# Patient Record
Sex: Male | Born: 1935 | Race: White | Hispanic: No | Smoking: Former smoker
Health system: Southern US, Community
[De-identification: ages and names within clinical notes are randomized; demographics above are authoritative.]

## PROBLEM LIST (undated history)

## (undated) DIAGNOSIS — K5790 Diverticulosis of intestine, part unspecified, without perforation or abscess without bleeding: Secondary | ICD-10-CM

## (undated) DIAGNOSIS — I10 Essential (primary) hypertension: Secondary | ICD-10-CM

## (undated) DIAGNOSIS — K219 Gastro-esophageal reflux disease without esophagitis: Secondary | ICD-10-CM

## (undated) DIAGNOSIS — D122 Benign neoplasm of ascending colon: Secondary | ICD-10-CM

## (undated) DIAGNOSIS — E538 Deficiency of other specified B group vitamins: Secondary | ICD-10-CM

## (undated) DIAGNOSIS — K760 Fatty (change of) liver, not elsewhere classified: Secondary | ICD-10-CM

## (undated) DIAGNOSIS — F0390 Unspecified dementia without behavioral disturbance: Secondary | ICD-10-CM

## (undated) DIAGNOSIS — D124 Benign neoplasm of descending colon: Secondary | ICD-10-CM

## (undated) DIAGNOSIS — S06309A Unspecified focal traumatic brain injury with loss of consciousness of unspecified duration, initial encounter: Secondary | ICD-10-CM

## (undated) DIAGNOSIS — D649 Anemia, unspecified: Secondary | ICD-10-CM

## (undated) DIAGNOSIS — C20 Malignant neoplasm of rectum: Secondary | ICD-10-CM

## (undated) DIAGNOSIS — J309 Allergic rhinitis, unspecified: Secondary | ICD-10-CM

## (undated) DIAGNOSIS — S0630AA Unspecified focal traumatic brain injury with loss of consciousness status unknown, initial encounter: Secondary | ICD-10-CM

## (undated) DIAGNOSIS — Z933 Colostomy status: Secondary | ICD-10-CM

## (undated) DIAGNOSIS — H409 Unspecified glaucoma: Secondary | ICD-10-CM

## (undated) DIAGNOSIS — K635 Polyp of colon: Secondary | ICD-10-CM

## (undated) DIAGNOSIS — F1910 Other psychoactive substance abuse, uncomplicated: Secondary | ICD-10-CM

## (undated) DIAGNOSIS — E785 Hyperlipidemia, unspecified: Secondary | ICD-10-CM

## (undated) HISTORY — DX: Essential (primary) hypertension: I10

## (undated) HISTORY — DX: Polyp of colon: K63.5

## (undated) HISTORY — DX: Anemia, unspecified: D64.9

## (undated) HISTORY — DX: Other psychoactive substance abuse, uncomplicated: F19.10

## (undated) HISTORY — DX: Unspecified glaucoma: H40.9

## (undated) HISTORY — PX: CATARACT EXTRACTION W/ INTRAOCULAR LENS IMPLANT: SHX1309

## (undated) HISTORY — DX: Hyperlipidemia, unspecified: E78.5

## (undated) HISTORY — DX: Unspecified dementia, unspecified severity, without behavioral disturbance, psychotic disturbance, mood disturbance, and anxiety: F03.90

## (undated) HISTORY — DX: Allergic rhinitis, unspecified: J30.9

## (undated) SURGERY — PACEMAKER IMPLANT
Anesthesia: Moderate Sedation

---

## 2006-04-18 ENCOUNTER — Emergency Department: Payer: Self-pay | Admitting: Emergency Medicine

## 2006-10-17 ENCOUNTER — Ambulatory Visit: Payer: Self-pay | Admitting: Gastroenterology

## 2010-01-08 ENCOUNTER — Ambulatory Visit: Payer: Self-pay | Admitting: Internal Medicine

## 2012-01-25 ENCOUNTER — Ambulatory Visit: Payer: Self-pay | Admitting: Gastroenterology

## 2012-10-07 ENCOUNTER — Other Ambulatory Visit: Payer: Self-pay

## 2012-10-07 MED ORDER — DONEPEZIL HCL 10 MG PO TABS: 10.0000 mg | ORAL_TABLET | Freq: Every day | ORAL | Status: DC

## 2012-10-07 NOTE — Telephone Encounter (Signed)
Former [facility] patient assigned to Dr Athar.  

## 2012-11-13 ENCOUNTER — Telehealth: Payer: Self-pay | Admitting: Neurology

## 2012-12-11 ENCOUNTER — Encounter: Payer: Self-pay | Admitting: Neurology

## 2012-12-19 ENCOUNTER — Encounter: Payer: Self-pay | Admitting: Neurology

## 2013-02-08 ENCOUNTER — Encounter: Payer: Medicare Other | Admitting: Neurology

## 2013-07-17 NOTE — Telephone Encounter (Signed)
Closing encounter

## 2014-10-07 ENCOUNTER — Ambulatory Visit: Payer: Medicare Other | Admitting: Speech Pathology

## 2014-10-08 ENCOUNTER — Encounter: Payer: Self-pay | Admitting: Speech Pathology

## 2014-10-08 ENCOUNTER — Ambulatory Visit: Payer: Medicare Other | Attending: Family Medicine | Admitting: Speech Pathology

## 2014-10-08 DIAGNOSIS — R1319 Other dysphagia: Secondary | ICD-10-CM

## 2014-10-08 DIAGNOSIS — R1310 Dysphagia, unspecified: Secondary | ICD-10-CM | POA: Insufficient documentation

## 2014-10-08 NOTE — Therapy (Signed)
[place] MAIN [facility] SERVICES [address] [place], [place], [address] Phone: [phone]   Fax:  [phone]  Speech Language Pathology Evaluation Clinical Bedside Swallowing Evaluation  Patient Details  Name: Kenneth Luna. MRN: [id number] Date of Birth: [birth year] Referring Provider:  Dion Body, MD  Encounter Date: 10/08/2014      End of Session - 10/08/14 1344    Visit Number 1   Number of Visits 1   Date for SLP Re-Evaluation 10/08/14   SLP Start Time 1100   SLP Stop Time  1145   SLP Time Calculation (min) 45 min   Activity Tolerance Patient tolerated treatment well      Past Medical History  Diagnosis Date  . Hypertension   . Glaucoma   . Colon polyp   . Diverticulosis   . Allergic rhinitis   . Dementia   . Substance abuse   . Hyperlipidemia   . Anemia     History reviewed. No pertinent past surgical history.  There were no vitals filed for this visit.  Visit Diagnosis: Esophageal dysphagia      Subjective Assessment - 10/08/14 1341    Subjective The patient has dementia and his caregiver gives his swallowing history.  She reports that the patient coughs with biscuits and meats primarily, that his oral intake is decreasing, and that his cough is severe enough at times to cause vomiting.  When questioned, she feels that the coughing begins after he has swallowed a few bites vs. on his first couple swallows or as he swallow.  Per caregiver report, the patient is eating about a quarter of his previous intake at meals.  The patient reports that he is eating less due to feeling full after just a small amount of food.     Patient is accompained by: --  Caregiver   Currently in Pain? No/denies            SLP Evaluation [facility] - 10/08/14 0001    SLP Visit Information   SLP Received On 10/08/14           SLP Education - 10/08/14 1341    Education provided Yes   Education Details Discussed the  results and implications of this evaluation.   Person(s) Educated Patient;Caregiver(s)   Methods Explanation   Comprehension Verbalized understanding  Patient comprehension limited by cognitive impairment     Clinical Swallowing Evaluation:  Oral Motor: Within normal limits  Thin liquid: 6 cup rim sips, 2 pulls through a straw, total amount take approximately 4 ounces.  No overt clinical indicators of aspiration, full laryngeal elevation per palpation, clear vocal quality between boluses.  Pureed solid: 6 self-fed teaspoon boluses apple sauce.  No overt clinical indicators of aspiration, full laryngeal elevation per palpation, clear vocal quality between boluses.  Mechanical soft solid:  graham cracker with peanut butter.  No overt clinical indicators of aspiration, rotary mastication, no oral residue post swallow, full laryngeal elevation per palpation, clear vocal quality between boluses.         Plan - 10/08/14 1345    Clinical Impression Statement This 81 year old man with dementia is presenting with normal oropharyngeal swallowing function per clinical swallowing assessment.  The patient's caregiver gives his swallowing history.  She reports that the patient coughs with biscuits and meats primarily, that his oral intake is decreasing, and that his cough is severe enough at times to cause vomiting.  When questioned, she feels that the coughing begins  after he has swallowed a few bites vs. on his first couple swallows or as he swallow.  Per caregiver report, the patient is eating about a quarter of his previous intake at meals.  The patient reports that he is eating less due to feeling full after just a small amount of food.  The patient demonstrated no overt clinical indications of aspiration with thin liquid, pureed solid, or mechanical soft solid.  The patient demonstrated coughing after he had stated he had enough to eat, he was full.  The cough did not appear to be associated with  swallowing food or liquid and aspiration is not a likely the cause of the cough.  The patient may benefit from consult with GI to fully evaluate esophageal function.  The patient and his caregiver were advised to eat soft foods, to plan to eat smaller but more frequent meals, to consume calorie/nutrient dense foods, and avoid foods that seem to cause coughing.   Speech Therapy Frequency 1x /week   Duration 1 week   Treatment/Interventions Other (comment)  Assessment and explanation   Consulted and Agree with Plan of Care Patient;Family member/caregiver   Family Member Consulted Caregiver          G-Codes - 2014-10-14 1356    Functional Assessment Tool Used Clinical Bedside Swallow Eval   Functional Limitations Swallowing   Swallow Current Status [phone]) At least 1 percent but less than 20 percent impaired, limited or restricted   Swallow Goal Status (P6195) At least 1 percent but less than 20 percent impaired, limited or restricted   Swallow Discharge Status [phone]) At least 1 percent but less than 20 percent impaired, limited or restricted      Problem List There are no active problems to display for this patient.  Leroy Sea, MS/CCC- SLP  Lou Miner October 14, 2014, 2:04 PM  [place] MAIN [facility] SERVICES [address] [place], [place], [address] Phone: [phone]   Fax:  [phone]

## 2015-05-04 ENCOUNTER — Other Ambulatory Visit: Payer: Self-pay | Admitting: Family Medicine

## 2015-05-04 DIAGNOSIS — R740 Nonspecific elevation of levels of transaminase and lactic acid dehydrogenase [LDH]: Principal | ICD-10-CM

## 2015-05-04 DIAGNOSIS — R7401 Elevation of levels of liver transaminase levels: Secondary | ICD-10-CM

## 2015-05-06 ENCOUNTER — Ambulatory Visit
Admission: RE | Admit: 2015-05-06 | Discharge: 2015-05-06 | Disposition: A | Discharge status: Home / Self Care | Payer: Medicare Other | Source: Ambulatory Visit | Attending: Family Medicine | Admitting: Family Medicine

## 2015-05-06 DIAGNOSIS — R7401 Elevation of levels of liver transaminase levels: Secondary | ICD-10-CM

## 2015-05-06 DIAGNOSIS — K8020 Calculus of gallbladder without cholecystitis without obstruction: Secondary | ICD-10-CM | POA: Insufficient documentation

## 2015-05-06 DIAGNOSIS — R740 Nonspecific elevation of levels of transaminase and lactic acid dehydrogenase [LDH]: Secondary | ICD-10-CM | POA: Insufficient documentation

## 2015-05-06 DIAGNOSIS — K769 Liver disease, unspecified: Secondary | ICD-10-CM | POA: Insufficient documentation

## 2015-06-16 ENCOUNTER — Ambulatory Visit: Payer: Medicare Other | Admitting: Dietician

## 2015-06-17 ENCOUNTER — Ambulatory Visit: Payer: Medicare Other | Admitting: Dietician

## 2015-06-23 ENCOUNTER — Inpatient Hospital Stay
Admission: EM | Admit: 2015-06-23 | Discharge: 2015-06-24 | DRG: 378 | Disposition: A | Discharge status: Home / Self Care | Payer: Medicare Other | Attending: Internal Medicine | Admitting: Internal Medicine

## 2015-06-23 ENCOUNTER — Encounter: Payer: Self-pay | Admitting: Medical Oncology

## 2015-06-23 DIAGNOSIS — K921 Melena: Principal | ICD-10-CM | POA: Diagnosis present

## 2015-06-23 DIAGNOSIS — I959 Hypotension, unspecified: Secondary | ICD-10-CM

## 2015-06-23 DIAGNOSIS — K922 Gastrointestinal hemorrhage, unspecified: Secondary | ICD-10-CM | POA: Diagnosis not present

## 2015-06-23 DIAGNOSIS — Z79899 Other long term (current) drug therapy: Secondary | ICD-10-CM

## 2015-06-23 DIAGNOSIS — F0390 Unspecified dementia without behavioral disturbance: Secondary | ICD-10-CM | POA: Diagnosis present

## 2015-06-23 DIAGNOSIS — E871 Hypo-osmolality and hyponatremia: Secondary | ICD-10-CM | POA: Diagnosis present

## 2015-06-23 DIAGNOSIS — E785 Hyperlipidemia, unspecified: Secondary | ICD-10-CM | POA: Diagnosis present

## 2015-06-23 DIAGNOSIS — Z86010 Personal history of colonic polyps: Secondary | ICD-10-CM

## 2015-06-23 DIAGNOSIS — I10 Essential (primary) hypertension: Secondary | ICD-10-CM | POA: Diagnosis present

## 2015-06-23 DIAGNOSIS — Z87891 Personal history of nicotine dependence: Secondary | ICD-10-CM | POA: Diagnosis not present

## 2015-06-23 DIAGNOSIS — R001 Bradycardia, unspecified: Secondary | ICD-10-CM | POA: Diagnosis present

## 2015-06-23 DIAGNOSIS — H409 Unspecified glaucoma: Secondary | ICD-10-CM | POA: Diagnosis present

## 2015-06-23 DIAGNOSIS — R7401 Elevation of levels of liver transaminase levels: Secondary | ICD-10-CM

## 2015-06-23 DIAGNOSIS — R740 Nonspecific elevation of levels of transaminase and lactic acid dehydrogenase [LDH]: Secondary | ICD-10-CM | POA: Diagnosis present

## 2015-06-23 DIAGNOSIS — D62 Acute posthemorrhagic anemia: Secondary | ICD-10-CM

## 2015-06-23 LAB — CBC
HCT: 37.1 % — ABNORMAL LOW (ref 40.0–52.0)
Hemoglobin: 12.6 g/dL — ABNORMAL LOW (ref 13.0–18.0)
MCH: 32.9 pg (ref 26.0–34.0)
MCHC: 34 g/dL (ref 32.0–36.0)
MCV: 96.7 fL (ref 80.0–100.0)
PLATELETS: 171 10*3/uL (ref 150–440)
RBC: 3.84 MIL/uL — AB (ref 4.40–5.90)
RDW: 13 % (ref 11.5–14.5)
WBC: 5.9 10*3/uL (ref 3.8–10.6)

## 2015-06-23 LAB — COMPREHENSIVE METABOLIC PANEL
ALT: 45 U/L (ref 17–63)
AST: 58 U/L — AB (ref 15–41)
Albumin: 3.8 g/dL (ref 3.5–5.0)
Alkaline Phosphatase: 42 U/L (ref 38–126)
Anion gap: 10 (ref 5–15)
BILIRUBIN TOTAL: 0.7 mg/dL (ref 0.3–1.2)
BUN: 16 mg/dL (ref 6–20)
CHLORIDE: 100 mmol/L — AB (ref 101–111)
CO2: 24 mmol/L (ref 22–32)
CREATININE: 0.79 mg/dL (ref 0.61–1.24)
Calcium: 9.5 mg/dL (ref 8.9–10.3)
Glucose, Bld: 118 mg/dL — ABNORMAL HIGH (ref 65–99)
Potassium: 4.2 mmol/L (ref 3.5–5.1)
Sodium: 134 mmol/L — ABNORMAL LOW (ref 135–145)
TOTAL PROTEIN: 6.8 g/dL (ref 6.5–8.1)

## 2015-06-23 LAB — TYPE AND SCREEN
ABO/RH(D): O POS
ANTIBODY SCREEN: NEGATIVE

## 2015-06-23 LAB — HEMOGLOBIN
Hemoglobin: 11.3 g/dL — ABNORMAL LOW (ref 13.0–18.0)
Hemoglobin: 12.8 g/dL — ABNORMAL LOW (ref 13.0–18.0)

## 2015-06-23 LAB — ABO/RH: ABO/RH(D): O POS

## 2015-06-23 MED ORDER — ONDANSETRON HCL 4 MG/2ML IJ SOLN: 4.0000 mg | Freq: Four times a day (QID) | INTRAMUSCULAR | Status: DC | PRN

## 2015-06-23 MED ORDER — DONEPEZIL HCL 5 MG PO TABS
10.0000 mg | ORAL_TABLET | Freq: Every day | ORAL | Status: DC
  Filled 2015-06-23: qty 2

## 2015-06-23 MED ORDER — TIMOLOL MALEATE 0.5 % OP SOLN
1.0000 [drp] | Freq: Two times a day (BID) | OPHTHALMIC | Status: DC
  Administered 2015-06-24: 1 [drp] via OPHTHALMIC
  Filled 2015-06-23: qty 5

## 2015-06-23 MED ORDER — SODIUM CHLORIDE 0.9 % IV SOLN
INTRAVENOUS | Status: DC
  Administered 2015-06-23 – 2015-06-24 (×2): via INTRAVENOUS

## 2015-06-23 MED ORDER — SODIUM CHLORIDE 0.9% FLUSH
3.0000 mL | Freq: Two times a day (BID) | INTRAVENOUS | Status: DC
  Administered 2015-06-23: 3 mL via INTRAVENOUS

## 2015-06-23 MED ORDER — FLUTICASONE PROPIONATE 50 MCG/ACT NA SUSP
2.0000 | Freq: Every day | NASAL | Status: DC | PRN
  Filled 2015-06-23: qty 16

## 2015-06-23 MED ORDER — MONTELUKAST SODIUM 10 MG PO TABS
10.0000 mg | ORAL_TABLET | Freq: Every day | ORAL | Status: DC
  Administered 2015-06-23: 10 mg via ORAL
  Filled 2015-06-23: qty 1

## 2015-06-23 MED ORDER — PANTOPRAZOLE SODIUM 40 MG IV SOLR
40.0000 mg | Freq: Two times a day (BID) | INTRAVENOUS | Status: DC
  Administered 2015-06-23 – 2015-06-24 (×2): 40 mg via INTRAVENOUS
  Filled 2015-06-23 (×2): qty 40

## 2015-06-23 MED ORDER — BRIMONIDINE TARTRATE-TIMOLOL 0.2-0.5 % OP SOLN: 1.0000 [drp] | Freq: Two times a day (BID) | OPHTHALMIC | Status: DC

## 2015-06-23 MED ORDER — ACETAMINOPHEN 650 MG RE SUPP: 650.0000 mg | Freq: Four times a day (QID) | RECTAL | Status: DC | PRN

## 2015-06-23 MED ORDER — LORATADINE 10 MG PO TABS
10.0000 mg | ORAL_TABLET | Freq: Every day | ORAL | Status: DC
  Administered 2015-06-24: 10 mg via ORAL
  Filled 2015-06-23 (×2): qty 1

## 2015-06-23 MED ORDER — BRIMONIDINE TARTRATE 0.2 % OP SOLN
1.0000 [drp] | Freq: Two times a day (BID) | OPHTHALMIC | Status: DC
  Administered 2015-06-24: 1 [drp] via OPHTHALMIC
  Filled 2015-06-23: qty 5

## 2015-06-23 MED ORDER — ONDANSETRON HCL 4 MG PO TABS: 4.0000 mg | ORAL_TABLET | Freq: Four times a day (QID) | ORAL | Status: DC | PRN

## 2015-06-23 MED ORDER — ACETAMINOPHEN 325 MG PO TABS: 650.0000 mg | ORAL_TABLET | Freq: Four times a day (QID) | ORAL | Status: DC | PRN

## 2015-06-23 NOTE — ED Notes (Signed)
Pt denies n/v/d, LOC, SOB or pain.  Pt sts he had one episode of rectal bleeding last night, dark red w/ no clots.  Pt A/O x 4.  NAD

## 2015-06-23 NOTE — H&P (Addendum)
[facility] [place] NAME: Kenneth Luna    MR#:  [id number]  DATE OF BIRTH:  10/08/1932  DATE OF ADMISSION:  06/23/2015  PRIMARY CARE PHYSICIAN: Dion Body, MD   REQUESTING/REFERRING PHYSICIAN:   CHIEF COMPLAINT:   Chief Complaint  Patient presents with  . Rectal Bleeding    HISTORY OF PRESENT ILLNESS: Kenneth Luna  is a 82 y.o. male with a known history of essential hypertension, glaucoma, diverticulosis, dementia, hyperlipidemia, who presents to the hospital with complaints of GI bleed. Apparently patient started having rectal bleeding yesterday at around 8:30 PM it was dark blood pouring out from his rectum, and there was no stool. Patient had less than one half of cup of blood per his estimation. He had no bleeding overnight and he had 2 normal bowel movements earlier today with no blood, stool looks dark brown but no blood was mixed in. Patient intermittently takes aspirin, Aleve as well as Advil as needed, but not daily. Patient denied any abdominal pain, nausea, vomiting, diarrhea. On arrival to emergency room, patient was hypotensive with systolic blood pressure in 90s, labs revealed anemia. Hospitalist services were contacted for admission  PAST MEDICAL HISTORY:   Past Medical History  Diagnosis Date  . Hypertension   . Glaucoma   . Colon polyp   . Diverticulosis   . Allergic rhinitis   . Dementia   . Substance abuse   . Hyperlipidemia   . Anemia     PAST SURGICAL HISTORY: History reviewed. No pertinent past surgical history.  SOCIAL HISTORY:  Social History  Substance Use Topics  . Smoking status: Former Smoker    Types: Pipe    Quit date: 04/18/1978  . Smokeless tobacco: Not on file  . Alcohol Use: Not on file    FAMILY HISTORY: No early coronary artery disease .  DRUG ALLERGIES: No Known Allergies  Review of Systems  Constitutional: Negative for fever, chills, weight loss and  malaise/fatigue.  HENT: Negative for congestion.   Eyes: Negative for blurred vision and double vision.  Respiratory: Negative for cough, sputum production, shortness of breath and wheezing.   Cardiovascular: Negative for chest pain, palpitations, orthopnea, leg swelling and PND.  Gastrointestinal: Positive for blood in stool. Negative for nausea, vomiting, abdominal pain, diarrhea, constipation and melena.  Genitourinary: Negative for dysuria, urgency, frequency and hematuria.  Musculoskeletal: Negative for falls.  Skin: Negative for rash.  Neurological: Negative for dizziness and weakness.  Psychiatric/Behavioral: Negative for depression and memory loss. The patient is not nervous/anxious.     MEDICATIONS AT HOME:  Prior to Admission medications   Medication Sig Start Date End Date Taking? Authorizing Provider  brimonidine-timolol (COMBIGAN) 0.2-0.5 % ophthalmic solution Place 1 drop into both eyes 2 (two) times daily.   Yes Historical Provider, MD  donepezil (ARICEPT) 10 MG tablet Take 10 mg by mouth at bedtime.   Yes Historical Provider, MD  fexofenadine (ALLEGRA) 180 MG tablet Take 180 mg by mouth daily as needed for allergies.    Yes Historical Provider, MD  fluticasone (FLONASE) 50 MCG/ACT nasal spray Place 2 sprays into both nostrils daily as needed for rhinitis.    Yes Historical Provider, MD  metoprolol succinate (TOPROL-XL) 100 MG 24 hr tablet Take 100 mg by mouth daily. Take with or immediately following a meal.   Yes Historical Provider, MD  montelukast (SINGULAIR) 10 MG tablet Take 10 mg by mouth at bedtime.   Yes Historical Provider, MD  valsartan (DIOVAN) 80 MG tablet Take 80 mg by mouth daily.   Yes Historical Provider, MD      PHYSICAL EXAMINATION:   VITAL SIGNS: Blood pressure 161/87, pulse 73, temperature 97.4 F (36.3 C), temperature source Oral, resp. rate 20, height 5\' 5"  (1.651 m), weight 54.885 kg (121 lb), SpO2 99 %.  GENERAL:  82 y.o.-year-old patient lying  in the bed with no acute distress.  EYES: Pupils equal, round, reactive to light and accommodation. No scleral icterus. Extraocular muscles intact.  HEENT: Head atraumatic, normocephalic. Oropharynx and nasopharynx clear.  NECK:  Supple, no jugular venous distention. No thyroid enlargement, no tenderness.  LUNGS: Normal breath sounds bilaterally, no wheezing, rales,rhonchi or crepitation. No use of accessory muscles of respiration.  CARDIOVASCULAR: S1, S2 normal. No murmurs, rubs, or gallops.  ABDOMEN: Soft, nontender, nondistended. Bowel sounds present. No organomegaly or mass.  EXTREMITIES: No pedal edema, cyanosis, or clubbing.  NEUROLOGIC: Cranial nerves II through XII are intact. Muscle strength 5/5 in all extremities. Sensation intact. Gait not checked.  PSYCHIATRIC: The patient is alert and oriented x 3.  SKIN: No obvious rash, lesion, or ulcer.   LABORATORY PANEL:   CBC  Recent Labs Lab 06/23/15 1506  WBC 5.9  HGB 12.6*  HCT 37.1*  PLT 171  MCV 96.7  MCH 32.9  MCHC 34.0  RDW 13.0   ------------------------------------------------------------------------------------------------------------------  Chemistries   Recent Labs Lab 06/23/15 1506  NA 134*  K 4.2  CL 100*  CO2 24  GLUCOSE 118*  BUN 16  CREATININE 0.79  CALCIUM 9.5  AST 58*  ALT 45  ALKPHOS 42  BILITOT 0.7   ------------------------------------------------------------------------------------------------------------------  Cardiac Enzymes No results for input(s): TROPONINI in the last 168 hours. ------------------------------------------------------------------------------------------------------------------  RADIOLOGY: No results found.  EKG: No orders found for this or any previous visit.  IMPRESSION AND PLAN:  Active Problems:   Acute posthemorrhagic anemia   GI bleed   Hypotension   Hyponatremia   Elevated transaminase level  #1. Acute posthemorrhagic anemia, patient's hemoglobin  level was 14.5 in January 2017, decreased to 12.6 today, continue hemoglobin level every 8 hours, transfuse as needed. #2 gastrointestinal bleed of unclear etiology, suspected lower GI bleed, get bleeding scan if recurrent, consult gastroenterologist for recommendations. The patient hasn't had colonoscopy per his recollection. Discussed with gastroenterologist, no octreotide is recommended at this time, initiate patient on Protonix #3. Hypotension, continue IV fluids, transfuse as needed. Holding blood pressure medications. Resume when blood pressure improves #4. Hyponatremia, follow with IV fluid administration. #5. Elevated transaminase level, chronic   All the records are reviewed and case discussed with ED provider. Management plans discussed with the patient, family and they are in agreement.  CODE STATUS: Code Status History    This patient does not have a recorded code status. Please follow your organizational policy for patients in this situation.       TOTAL TIME TAKING CARE OF THIS PATIENT: 50 minutes.    Theodoro Grist M.D on 06/23/2015 at 5:30 PM  Between 7am to 6pm - Pager - [phone] After 6pm go to [email or web address] - password [email or web address] [facility]  Office  [phone]  CC: Primary care physician; Dion Body, MD

## 2015-06-23 NOTE — ED Provider Notes (Signed)
[facility] Emergency Department Provider Note   ____________________________________________  Time seen: ~1615  I have reviewed the triage vital signs and the nursing notes.   HISTORY  Chief Complaint Rectal Bleeding   History limited by: Not Limited   HPI Kenneth Luna. is a 82 y.o. male who presents to the emergency department today because of concern for GI bleed. It occurred last night. Patient states that he did not have any associated pain with the bleeding. It started suddenly. States it was dark red in color. He has not noticed any more bleeding today. Has had two bowel movements today without any bleeding. No history of GI bleeding. Does not recall receiving a colonoscopy in the past.     Past Medical History  Diagnosis Date  . Hypertension   . Glaucoma   . Colon polyp   . Diverticulosis   . Allergic rhinitis   . Dementia   . Substance abuse   . Hyperlipidemia   . Anemia     There are no active problems to display for this patient.   History reviewed. No pertinent past surgical history.  Current Outpatient Rx  Name  Route  Sig  Dispense  Refill  . donepezil (ARICEPT) 10 MG tablet   Oral   Take 1 tablet (10 mg total) by mouth daily.   90 tablet   1   . fexofenadine (ALLEGRA) 180 MG tablet   Oral   Take 180 mg by mouth daily.         . fluticasone (FLONASE) 50 MCG/ACT nasal spray   Each Nare   Place into both nostrils daily.         . metoprolol succinate (TOPROL-XL) 100 MG 24 hr tablet   Oral   Take 100 mg by mouth daily. Take with or immediately following a meal.         . valsartan (DIOVAN) 160 MG tablet   Oral   Take 160 mg by mouth daily.           Allergies Review of patient's allergies indicates no known allergies.  No family history on file.  Social History Social History  Substance Use Topics  . Smoking status: Former Smoker    Types: Pipe    Quit date: 04/18/1978  . Smokeless  tobacco: None  . Alcohol Use: None    Review of Systems  Constitutional: Negative for fever. Cardiovascular: Negative for chest pain. Respiratory: Negative for shortness of breath. Gastrointestinal: Negative for abdominal pain, vomiting and diarrhea. Positive for GI bleed.  Neurological: Negative for headaches, focal weakness or numbness.   10-point ROS otherwise negative.  ____________________________________________   PHYSICAL EXAM:  VITAL SIGNS: ED Triage Vitals  Enc Vitals Group     BP 06/23/15 1503 97/60 mmHg     Pulse Rate 06/23/15 1503 72     Resp 06/23/15 1503 19     Temp 06/23/15 1503 97.4 F (36.3 C)     Temp Source 06/23/15 1503 Oral     SpO2 06/23/15 1503 98 %     Weight 06/23/15 1503 121 lb (54.885 kg)     Height 06/23/15 1503 5\' 5"  (1.651 m)   Constitutional: Alert and oriented. Well appearing and in no distress. Eyes: Conjunctivae are normal. PERRL. Normal extraocular movements. ENT   Head: Normocephalic and atraumatic.   Nose: No congestion/rhinnorhea.   Mouth/Throat: Mucous membranes are moist.   Neck: No stridor. Hematological/Lymphatic/Immunilogical: No cervical lymphadenopathy. Cardiovascular: Normal rate, regular rhythm.  No murmurs, rubs, or gallops. Respiratory: Normal respiratory effort without tachypnea nor retractions. Breath sounds are clear and equal bilaterally. No wheezes/rales/rhonchi. Gastrointestinal: Soft and nontender. No distention. There is no CVA tenderness. Rectal: No gross blood on glove. Brown stool. GUIAC positive.  Musculoskeletal: Normal range of motion in all extremities. No joint effusions.  No lower extremity tenderness nor edema. Neurologic:  Normal speech and language. No gross focal neurologic deficits are appreciated.  Skin:  Skin is warm, dry and intact. No rash noted. Psychiatric: Mood and affect are normal. Speech and behavior are normal. Patient exhibits appropriate insight and  judgment.  ____________________________________________    LABS (pertinent positives/negatives)  Labs Reviewed  COMPREHENSIVE METABOLIC PANEL - Abnormal; Notable for the following:    Sodium 134 (*)    Chloride 100 (*)    Glucose, Bld 118 (*)    AST 58 (*)    All other components within normal limits  CBC - Abnormal; Notable for the following:    RBC 3.84 (*)    Hemoglobin 12.6 (*)    HCT 37.1 (*)    All other components within normal limits  HEMOGLOBIN - Abnormal; Notable for the following:    Hemoglobin 12.8 (*)    All other components within normal limits  HEMOGLOBIN  TYPE AND SCREEN  ABO/RH     ____________________________________________   EKG  None  ____________________________________________    RADIOLOGY  None   ____________________________________________   PROCEDURES  Procedure(s) performed: None  Critical Care performed: No  ____________________________________________   INITIAL IMPRESSION / ASSESSMENT AND PLAN / ED COURSE  Pertinent labs & imaging results that were available during my care of the patient were reviewed by me and considered in my medical decision making (see chart for details).  Patient presented to the emergency department today after concerns for GI bleed. Patient's hemoglobin today 12.5. Appears that the last hemoglobin done by primary care was roughly 14.5. Additionally patient is guaiac positive. Will plan on admission to the hospital service.  ____________________________________________   FINAL CLINICAL IMPRESSION(S) / ED DIAGNOSES  Final diagnoses:  Gastrointestinal hemorrhage, unspecified gastritis, unspecified gastrointestinal hemorrhage type     Nance Pear, MD 06/23/15 1950

## 2015-06-23 NOTE — ED Notes (Signed)
Pt reports 2 episodes of dark red rectal bleeding that began last night. Pt denies pain. Pt in nad.

## 2015-06-24 LAB — BASIC METABOLIC PANEL
ANION GAP: 6 (ref 5–15)
BUN: 16 mg/dL (ref 6–20)
CO2: 26 mmol/L (ref 22–32)
Calcium: 8.6 mg/dL — ABNORMAL LOW (ref 8.9–10.3)
Chloride: 104 mmol/L (ref 101–111)
Creatinine, Ser: 0.74 mg/dL (ref 0.61–1.24)
Glucose, Bld: 91 mg/dL (ref 65–99)
POTASSIUM: 4.3 mmol/L (ref 3.5–5.1)
SODIUM: 136 mmol/L (ref 135–145)

## 2015-06-24 LAB — CBC
HEMATOCRIT: 31.4 % — AB (ref 40.0–52.0)
Hemoglobin: 10.9 g/dL — ABNORMAL LOW (ref 13.0–18.0)
MCH: 33.4 pg (ref 26.0–34.0)
MCHC: 34.8 g/dL (ref 32.0–36.0)
MCV: 96.1 fL (ref 80.0–100.0)
Platelets: 121 10*3/uL — ABNORMAL LOW (ref 150–440)
RBC: 3.26 MIL/uL — AB (ref 4.40–5.90)
RDW: 12.9 % (ref 11.5–14.5)
WBC: 3.9 10*3/uL (ref 3.8–10.6)

## 2015-06-24 LAB — HEMOGLOBIN: Hemoglobin: 11.7 g/dL — ABNORMAL LOW (ref 13.0–18.0)

## 2015-06-24 MED ORDER — OMEPRAZOLE 20 MG PO CPDR: 20.0000 mg | DELAYED_RELEASE_CAPSULE | Freq: Every day | ORAL | Status: AC

## 2015-06-24 MED ORDER — HYDROCORTISONE ACETATE 25 MG RE SUPP: 25.0000 mg | Freq: Two times a day (BID) | RECTAL | Status: DC

## 2015-06-24 MED ORDER — METOPROLOL SUCCINATE ER 25 MG PO TB24: 25.0000 mg | ORAL_TABLET | Freq: Every day | ORAL | Status: DC

## 2015-06-24 NOTE — Progress Notes (Signed)
GI Note:  GI consulted last night for routine consult andt discharged this am before GI consult could see him.   He can follow up in GI clinic.

## 2015-06-24 NOTE — Progress Notes (Signed)
Pt given discharge instruction and prescriptions pt discharged via weelchair care of family

## 2015-06-24 NOTE — Discharge Instructions (Signed)
Gastrointestinal Bleeding Gastrointestinal (GI) bleeding means there is bleeding somewhere along the digestive tract, between the mouth and anus. CAUSES  There are many different problems that can cause GI bleeding. Possible causes include:  Esophagitis. This is inflammation, irritation, or swelling of the esophagus.  Hemorrhoids.These are veins that are full of blood (engorged) in the rectum. They cause pain, inflammation, and may bleed.  Anal fissures.These are areas of painful tearing which may bleed. They are often caused by passing hard stool.  Diverticulosis.These are pouches that form on the colon over time, with age, and may bleed significantly.  Diverticulitis.This is inflammation in areas with diverticulosis. It can cause pain, fever, and bloody stools, although bleeding is rare.  Polyps and cancer. Colon cancer often starts out as precancerous polyps.  Gastritis and ulcers.Bleeding from the upper gastrointestinal tract (near the stomach) may travel through the intestines and produce black, sometimes tarry, often bad smelling stools. In certain cases, if the bleeding is fast enough, the stools may not be black, but red. This condition may be life-threatening. SYMPTOMS   Vomiting bright red blood or material that looks like coffee grounds.  Bloody, black, or tarry stools. DIAGNOSIS  Your caregiver may diagnose your condition by taking your history and performing a physical exam. More tests may be needed, including:  X-rays and other imaging tests.  Esophagogastroduodenoscopy (EGD). This test uses a flexible, lighted tube to look at your esophagus, stomach, and small intestine.  Colonoscopy. This test uses a flexible, lighted tube to look at your colon. TREATMENT  Treatment depends on the cause of your bleeding.   For bleeding from the esophagus, stomach, small intestine, or colon, the caregiver doing your EGD or colonoscopy may be able to stop the bleeding as part of  the procedure.  Inflammation or infection of the colon can be treated with medicines.  Many rectal problems can be treated with creams, suppositories, or warm baths.  Surgery is sometimes needed.  Blood transfusions are sometimes needed if you have lost a lot of blood. If bleeding is slow, you may be allowed to go home. If there is a lot of bleeding, you will need to stay in the hospital for observation. HOME CARE INSTRUCTIONS   Take any medicines exactly as prescribed.  Keep your stools soft by eating foods that are high in fiber. These foods include whole grains, legumes, fruits, and vegetables. Prunes (1 to 3 a day) work well for many people.  Drink enough fluids to keep your urine clear or pale yellow. SEEK IMMEDIATE MEDICAL CARE IF:   Your bleeding increases.  You feel lightheaded, weak, or you faint.  You have severe cramps in your back or abdomen.  You pass large blood clots in your stool.  Your problems are getting worse. MAKE SURE YOU:   Understand these instructions.  Will watch your condition.  Will get help right away if you are not doing well or get worse.   This information is not intended to replace advice given to you by your health care provider. Make sure you discuss any questions you have with your health care provider.   Document Released: 04/01/2000 Document Revised: 03/21/2012 Document Reviewed: 09/22/2014 Elsevier Interactive Patient Education 2016 [place] Not take any aspirin, advil, motrin, alleve

## 2015-06-24 NOTE — Discharge Summary (Signed)
[facility] [place] NAME: Kenneth Luna    MR#:  [id number]  DATE OF BIRTH:  12/15/1932  DATE OF ADMISSION:  06/23/2015 ADMITTING PHYSICIAN: Kenneth Grist, MD  DATE OF DISCHARGE: 06/24/2015 11:07 AM  PRIMARY CARE PHYSICIAN: Kenneth Body, MD    ADMISSION DIAGNOSIS:  Gastrointestinal hemorrhage, unspecified gastritis, unspecified gastrointestinal hemorrhage type [K92.2]  DISCHARGE DIAGNOSIS:  Active Problems:   Acute posthemorrhagic anemia   GI bleed   Hypotension   Hyponatremia   Elevated transaminase level   SECONDARY DIAGNOSIS:   Past Medical History  Diagnosis Date  . Hypertension   . Glaucoma   . Colon polyp   . Diverticulosis   . Allergic rhinitis   . Dementia   . Substance abuse   . Hyperlipidemia   . Anemia     HOSPITAL COURSE:   1. Rectal bleeding. Hemoglobin stable on 5 different blood draws. Last hemoglobin 11.7. Case discussed with Dr. Allen Luna gastroenterology. Patient was set up for follow-up appointment on Monday. Colonoscopy couldn't be considered as outpatient. I did prescribe Anusol suppositories. 2. Initial hypotension. Blood pressure increased. He can start back on his Diovan and I will lower the dose of his Toprol. 3. Bradycardia lower the dose of Toprol to 25 mg daily. 4. Dementia- continue medications 5. Glaucoma unspecified- continue eyedrops  DISCHARGE CONDITIONS:   Satisfactory  CONSULTS OBTAINED:  Treatment Team:  Kenneth Lame, MD  DRUG ALLERGIES:  No Known Allergies  DISCHARGE MEDICATIONS:   Discharge Medication List as of 06/24/2015 10:55 AM    START taking these medications   Details  hydrocortisone (ANUSOL-HC) 25 MG suppository Place 1 suppository (25 mg total) rectally 2 (two) times daily., Starting 06/24/2015, Until Discontinued, Print    omeprazole (PRILOSEC) 20 MG capsule Take 1 capsule (20 mg total) by mouth daily., Starting 06/24/2015, Until Discontinued, Print       CONTINUE these medications which have CHANGED   Details  metoprolol succinate (TOPROL XL) 25 MG 24 hr tablet Take 1 tablet (25 mg total) by mouth daily., Starting 06/24/2015, Until Discontinued, Print      CONTINUE these medications which have NOT CHANGED   Details  brimonidine-timolol (COMBIGAN) 0.2-0.5 % ophthalmic solution Place 1 drop into both eyes 2 (two) times daily., Until Discontinued, Historical Med    donepezil (ARICEPT) 10 MG tablet Take 10 mg by mouth at bedtime., Until Discontinued, Historical Med    fexofenadine (ALLEGRA) 180 MG tablet Take 180 mg by mouth daily as needed for allergies. , Until Discontinued, Historical Med    fluticasone (FLONASE) 50 MCG/ACT nasal spray Place 2 sprays into both nostrils daily as needed for rhinitis. , Until Discontinued, Historical Med    montelukast (SINGULAIR) 10 MG tablet Take 10 mg by mouth at bedtime., Until Discontinued, Historical Med    valsartan (DIOVAN) 80 MG tablet Take 80 mg by mouth daily., Until Discontinued, Historical Med         DISCHARGE INSTRUCTIONS:   Follow-up with PMD one week Case discussed with Dr. Allen Luna gastroenterology and patient was set up for Monday as follow-up.  If you experience worsening of your admission symptoms, develop shortness of breath, life threatening emergency, suicidal or homicidal thoughts you must seek medical attention immediately by calling 911 or calling your MD immediately  if symptoms less severe.  You Must read complete instructions/literature along with all the possible adverse reactions/side effects for all the Medicines you take and that have been prescribed to you. Take  any new Medicines after you have completely understood and accept all the possible adverse reactions/side effects.   Please note  You were cared for by a hospitalist during your hospital stay. If you have any questions about your discharge medications or the care you received while you were in the hospital after  you are discharged, you can call the unit and asked to speak with the hospitalist on call if the hospitalist that took care of you is not available. Once you are discharged, your primary care physician will handle any further medical issues. Please note that NO REFILLS for any discharge medications will be authorized once you are discharged, as it is imperative that you return to your primary care physician (or establish a relationship with a primary care physician if you do not have one) for your aftercare needs so that they can reassess your need for medications and monitor your lab values.    Today   CHIEF COMPLAINT:   Chief Complaint  Patient presents with  . Rectal Bleeding    HISTORY OF PRESENT ILLNESS:  Kenneth Luna  is a 82 y.o. male came in with rectal bleeding on Monday and no further bleeding here in the hospital.   VITAL SIGNS:  Blood pressure 150/53, pulse 51, temperature 98.2 F (36.8 C), temperature source Oral, resp. rate 18, height 5\' 5"  (1.651 m), weight 57.153 kg (126 lb), SpO2 98 %.    PHYSICAL EXAMINATION:  GENERAL:  82 y.o.-year-old patient lying in the bed with no acute distress.  EYES: Pupils equal, round, reactive to light and accommodation. No scleral icterus. Extraocular muscles intact.  HEENT: Head atraumatic, normocephalic. Oropharynx and nasopharynx clear.  NECK:  Supple, no jugular venous distention. No thyroid enlargement, no tenderness.  LUNGS: Normal breath sounds bilaterally, no wheezing, rales,rhonchi or crepitation. No use of accessory muscles of respiration.  CARDIOVASCULAR: S1, S2 normal. No murmurs, rubs, or gallops.  ABDOMEN: Soft, non-tender, non-distended. Bowel sounds present. No organomegaly or mass.  EXTREMITIES: No pedal edema, cyanosis, or clubbing.  NEUROLOGIC: Cranial nerves II through XII are intact. Muscle strength 5/5 in all extremities. Sensation intact. Gait not checked.  PSYCHIATRIC: The patient is alert.  SKIN: No obvious  rash, lesion, or ulcer.   DATA REVIEW:   CBC  Recent Labs Lab 06/24/15 0634 06/24/15 0916  WBC 3.9  --   HGB 10.9* 11.7*  HCT 31.4*  --   PLT 121*  --     Chemistries   Recent Labs Lab 06/23/15 1506 06/24/15 0634  NA 134* 136  K 4.2 4.3  CL 100* 104  CO2 24 26  GLUCOSE 118* 91  BUN 16 16  CREATININE 0.79 0.74  CALCIUM 9.5 8.6*  AST 58*  --   ALT 45  --   ALKPHOS 42  --   BILITOT 0.7  --      Management plans discussed with the patient, and caregiver and they are in agreement.  CODE STATUS:  Code Status History    Date Active Date Inactive Code Status Order ID Comments User Context   06/23/2015  8:08 PM 06/24/2015  2:09 PM Full Code [id number]  Kenneth Grist, MD Inpatient    Advance Directive Documentation        Most Recent Value   Type of Advance Directive  Healthcare Power of [place], Living will   Pre-existing out of facility DNR order (yellow form or pink MOST form)     "MOST" Form in Place?  TOTAL TIME TAKING CARE OF THIS PATIENT: 35 minutes.    Loletha Grayer M.D on 06/24/2015 at 4:34 PM  Between 7am to 6pm - Pager - [phone]  After 6pm go to [email or web address] - password [email or web address]  Office  [phone]  CC: Primary care physician; Kenneth Body, MD

## 2015-06-29 ENCOUNTER — Ambulatory Visit (INDEPENDENT_AMBULATORY_CARE_PROVIDER_SITE_OTHER): Payer: Medicare Other | Admitting: Gastroenterology

## 2015-06-29 ENCOUNTER — Encounter: Payer: Self-pay | Admitting: Gastroenterology

## 2015-06-29 ENCOUNTER — Other Ambulatory Visit: Payer: Self-pay

## 2015-06-29 VITALS — BP 122/67 | HR 79 | Temp 98.2°F | Ht 65.0 in | Wt 136.0 lb

## 2015-06-29 DIAGNOSIS — I10 Essential (primary) hypertension: Secondary | ICD-10-CM | POA: Insufficient documentation

## 2015-06-29 DIAGNOSIS — F0390 Unspecified dementia without behavioral disturbance: Secondary | ICD-10-CM | POA: Insufficient documentation

## 2015-06-29 DIAGNOSIS — K5790 Diverticulosis of intestine, part unspecified, without perforation or abscess without bleeding: Secondary | ICD-10-CM | POA: Insufficient documentation

## 2015-06-29 DIAGNOSIS — F1910 Other psychoactive substance abuse, uncomplicated: Secondary | ICD-10-CM | POA: Insufficient documentation

## 2015-06-29 DIAGNOSIS — E538 Deficiency of other specified B group vitamins: Secondary | ICD-10-CM | POA: Insufficient documentation

## 2015-06-29 DIAGNOSIS — K635 Polyp of colon: Secondary | ICD-10-CM | POA: Insufficient documentation

## 2015-06-29 DIAGNOSIS — H409 Unspecified glaucoma: Secondary | ICD-10-CM | POA: Insufficient documentation

## 2015-06-29 DIAGNOSIS — K921 Melena: Secondary | ICD-10-CM | POA: Diagnosis not present

## 2015-06-29 HISTORY — DX: Diverticulosis of intestine, part unspecified, without perforation or abscess without bleeding: K57.90

## 2015-06-29 HISTORY — DX: Deficiency of other specified B group vitamins: E53.8

## 2015-06-29 NOTE — Progress Notes (Signed)
Gastroenterology Consultation  Referring Provider:     Dion Body, MD Primary Care Physician:  Dion Body, MD Primary Gastroenterologist:  Dr. Allen Norris     Reason for Consultation:     Hematochezia        HPI:   Kenneth Bees. is a 82 y.o. y/o male referred for consultation & management of Hematochezia by Dr. Dion Body, MD.  This patient comes today after being in the hospital for rectal bleeding. The patient reports that he had rectal bleeding that came down his leg and he left the puddle of blood on the floor. The patient was admitted to the hospital and was observed there with a stable hemoglobin. The patient was then sent home after a call was put in to me to make sure that I could see the patient upon discharge. The patient states he has had no further bleeding. He also denies any abdominal pain fevers chills nausea or vomiting. The patient also denies ever having these symptoms in the past. He does report having a colonoscopy back in 2008 and he states at that time he was not told that he had anything wrong with him. There is no report of any unexplained weight loss. He also denies being constipated prior to having the rectal bleeding.  Past Medical History  Diagnosis Date  . Hypertension   . Glaucoma   . Colon polyp   . Diverticulosis   . Allergic rhinitis   . Dementia   . Substance abuse   . Hyperlipidemia   . Anemia     History reviewed. No pertinent past surgical history.  Prior to Admission medications   Medication Sig Start Date End Date Taking? Authorizing Provider  brimonidine-timolol (COMBIGAN) 0.2-0.5 % ophthalmic solution Place 1 drop into both eyes 2 (two) times daily.   Yes Historical Provider, MD  cetirizine (ZYRTEC) 10 MG tablet Take by mouth.   Yes Historical Provider, MD  donepezil (ARICEPT) 10 MG tablet Take 10 mg by mouth at bedtime.   Yes Historical Provider, MD  fexofenadine (ALLEGRA) 180 MG tablet Take 180 mg by mouth daily  as needed for allergies.    Yes Historical Provider, MD  fluticasone (FLONASE) 50 MCG/ACT nasal spray Place 2 sprays into both nostrils daily as needed for rhinitis.    Yes Historical Provider, MD  metoprolol succinate (TOPROL XL) 25 MG 24 hr tablet Take 1 tablet (25 mg total) by mouth daily. 06/24/15  Yes Richard Leslye Peer, MD  montelukast (SINGULAIR) 10 MG tablet Take 10 mg by mouth at bedtime.   Yes Historical Provider, MD  omeprazole (PRILOSEC) 20 MG capsule Take 1 capsule (20 mg total) by mouth daily. 06/24/15  Yes Richard Leslye Peer, MD  valsartan (DIOVAN) 80 MG tablet Take 80 mg by mouth daily.   Yes Historical Provider, MD  hydrocortisone (ANUSOL-HC) 25 MG suppository Place 1 suppository (25 mg total) rectally 2 (two) times daily. Patient not taking: Reported on 06/29/2015 06/24/15   Loletha Grayer, MD    History reviewed. No pertinent family history.   Social History  Substance Use Topics  . Smoking status: Former Smoker    Types: Pipe    Quit date: 04/18/1978  . Smokeless tobacco: Never Used  . Alcohol Use: Yes     Comment: few glass of wine daily    Allergies as of 06/29/2015  . (No Known Allergies)    Review of Systems:    All systems reviewed and negative except where noted in HPI.  Physical Exam:  BP 122/67 mmHg  Pulse 79  Temp(Src) 98.2 F (36.8 C) (Oral)  Ht 5\' 5"  (1.651 m)  Wt 136 lb (61.689 kg)  BMI 22.63 kg/m2 No LMP for male patient. Psych:  Alert and cooperative. Normal mood and affect. General:   Alert,  Well-developed, well-nourished, pleasant and cooperative in NAD Head:  Normocephalic and atraumatic. Eyes:  Sclera clear, no icterus.   Conjunctiva pink. Ears:  Normal auditory acuity. Nose:  No deformity, discharge, or lesions. Mouth:  No deformity or lesions,oropharynx pink & moist. Neck:  Supple; no masses or thyromegaly. Lungs:  Respirations even and unlabored.  Clear throughout to auscultation.   No wheezes, crackles, or rhonchi. No acute  distress. Heart:  Regular rate and rhythm; no murmurs, clicks, rubs, or gallops. Abdomen:  Normal bowel sounds.  No bruits.  Soft, non-tender and non-distended without masses, hepatosplenomegaly or hernias noted.  No guarding or rebound tenderness.  Negative Carnett sign.   Rectal:  Deferred.  Msk:  Symmetrical without gross deformities.  Good, equal movement & strength bilaterally. Pulses:  Normal pulses noted. Extremities:  No clubbing or edema.  No cyanosis. Neurologic:  Alert and oriented x3;  grossly normal neurologically. Skin:  Intact without significant lesions or rashes.  No jaundice. Lymph Nodes:  No significant cervical adenopathy. Psych:  Alert and cooperative. Normal mood and affect.  Imaging Studies: No results found.  Assessment and Plan:   Kenneth Walicki. is a 82 y.o. y/o male was admitted to the hospital with rectal bleeding. The patient has not had a colonoscopy as reported by him in many years. The patient will be set up for colonoscopy. I have discussed risks & benefits which include, but are not limited to, bleeding, infection, perforation & drug reaction.  The patient agrees with this plan & written consent will be obtained.      Note: This dictation was prepared with Dragon dictation along with smaller phrase technology. Any transcriptional errors that result from this process are unintentional.

## 2015-06-30 ENCOUNTER — Other Ambulatory Visit: Payer: Self-pay

## 2015-07-01 ENCOUNTER — Encounter: Payer: Self-pay | Admitting: *Deleted

## 2015-07-09 NOTE — Discharge Instructions (Signed)

## 2015-07-10 ENCOUNTER — Ambulatory Visit: Payer: Medicare Other | Admitting: Anesthesiology

## 2015-07-10 ENCOUNTER — Ambulatory Visit
Admission: RE | Admit: 2015-07-10 | Discharge: 2015-07-10 | Disposition: A | Discharge status: Home / Self Care | Payer: Medicare Other | Source: Ambulatory Visit | Attending: Gastroenterology | Admitting: Gastroenterology

## 2015-07-10 ENCOUNTER — Encounter
Admission: RE | Disposition: A | Discharge status: Home / Self Care | Payer: Self-pay | Source: Ambulatory Visit | Attending: Gastroenterology

## 2015-07-10 DIAGNOSIS — F0390 Unspecified dementia without behavioral disturbance: Secondary | ICD-10-CM | POA: Insufficient documentation

## 2015-07-10 DIAGNOSIS — E785 Hyperlipidemia, unspecified: Secondary | ICD-10-CM | POA: Insufficient documentation

## 2015-07-10 DIAGNOSIS — K760 Fatty (change of) liver, not elsewhere classified: Secondary | ICD-10-CM | POA: Insufficient documentation

## 2015-07-10 DIAGNOSIS — C20 Malignant neoplasm of rectum: Secondary | ICD-10-CM | POA: Diagnosis not present

## 2015-07-10 DIAGNOSIS — F1010 Alcohol abuse, uncomplicated: Secondary | ICD-10-CM | POA: Insufficient documentation

## 2015-07-10 DIAGNOSIS — K921 Melena: Secondary | ICD-10-CM | POA: Diagnosis present

## 2015-07-10 DIAGNOSIS — J309 Allergic rhinitis, unspecified: Secondary | ICD-10-CM | POA: Diagnosis not present

## 2015-07-10 DIAGNOSIS — K219 Gastro-esophageal reflux disease without esophagitis: Secondary | ICD-10-CM | POA: Insufficient documentation

## 2015-07-10 DIAGNOSIS — I10 Essential (primary) hypertension: Secondary | ICD-10-CM | POA: Insufficient documentation

## 2015-07-10 DIAGNOSIS — Z87891 Personal history of nicotine dependence: Secondary | ICD-10-CM | POA: Diagnosis not present

## 2015-07-10 DIAGNOSIS — Z79899 Other long term (current) drug therapy: Secondary | ICD-10-CM | POA: Insufficient documentation

## 2015-07-10 DIAGNOSIS — K5730 Diverticulosis of large intestine without perforation or abscess without bleeding: Secondary | ICD-10-CM | POA: Diagnosis not present

## 2015-07-10 DIAGNOSIS — D124 Benign neoplasm of descending colon: Secondary | ICD-10-CM | POA: Insufficient documentation

## 2015-07-10 DIAGNOSIS — H409 Unspecified glaucoma: Secondary | ICD-10-CM | POA: Insufficient documentation

## 2015-07-10 DIAGNOSIS — Z86010 Personal history of colonic polyps: Secondary | ICD-10-CM | POA: Diagnosis not present

## 2015-07-10 DIAGNOSIS — D122 Benign neoplasm of ascending colon: Secondary | ICD-10-CM | POA: Insufficient documentation

## 2015-07-10 DIAGNOSIS — Z7951 Long term (current) use of inhaled steroids: Secondary | ICD-10-CM | POA: Diagnosis not present

## 2015-07-10 DIAGNOSIS — D490 Neoplasm of unspecified behavior of digestive system: Secondary | ICD-10-CM | POA: Diagnosis not present

## 2015-07-10 HISTORY — PX: COLONOSCOPY WITH PROPOFOL: SHX5780

## 2015-07-10 HISTORY — PX: POLYPECTOMY: SHX5525

## 2015-07-10 HISTORY — DX: Gastro-esophageal reflux disease without esophagitis: K21.9

## 2015-07-10 HISTORY — DX: Fatty (change of) liver, not elsewhere classified: K76.0

## 2015-07-10 SURGERY — COLONOSCOPY WITH PROPOFOL
Anesthesia: Monitor Anesthesia Care

## 2015-07-10 MED ORDER — STERILE WATER FOR IRRIGATION IR SOLN
Status: DC | PRN
  Administered 2015-07-10: 09:00:00

## 2015-07-10 MED ORDER — LACTATED RINGERS IV SOLN
INTRAVENOUS | Status: DC
  Administered 2015-07-10: 08:00:00 via INTRAVENOUS

## 2015-07-10 MED ORDER — LACTATED RINGERS IV SOLN: 500.0000 mL | INTRAVENOUS | Status: DC

## 2015-07-10 MED ORDER — PROPOFOL 10 MG/ML IV BOLUS
INTRAVENOUS | Status: DC | PRN
  Administered 2015-07-10: 10 mg via INTRAVENOUS
  Administered 2015-07-10 (×4): 20 mg via INTRAVENOUS

## 2015-07-10 MED ORDER — SODIUM CHLORIDE 0.9 % IV SOLN: INTRAVENOUS | Status: DC

## 2015-07-10 SURGICAL SUPPLY — 21 items
CANISTER SUCT 1200ML W/VALVE (MISCELLANEOUS) ×4 IMPLANT
CLIP HMST 235XBRD CATH ROT (MISCELLANEOUS) IMPLANT
CLIP RESOLUTION 360 11X235 (MISCELLANEOUS)
FCP ESCP3.2XJMB 240X2.8X (MISCELLANEOUS)
FORCEPS BIOP RAD 4 LRG CAP 4 (CUTTING FORCEPS) ×4 IMPLANT
FORCEPS BIOP RJ4 240 W/NDL (MISCELLANEOUS)
FORCEPS ESCP3.2XJMB 240X2.8X (MISCELLANEOUS) IMPLANT
GOWN CVR UNV OPN BCK APRN NK (MISCELLANEOUS) ×4 IMPLANT
GOWN ISOL THUMB LOOP REG UNIV (MISCELLANEOUS) ×4
INJECTOR VARIJECT VIN23 (MISCELLANEOUS) IMPLANT
KIT DEFENDO VALVE AND CONN (KITS) IMPLANT
KIT ENDO PROCEDURE OLY (KITS) ×4 IMPLANT
MARKER SPOT ENDO TATTOO 5ML (MISCELLANEOUS) IMPLANT
PAD GROUND ADULT SPLIT (MISCELLANEOUS) IMPLANT
PROBE APC STR FIRE (PROBE) ×4 IMPLANT
SNARE SHORT THROW 13M SML OVAL (MISCELLANEOUS) IMPLANT
SNARE SHORT THROW 30M LRG OVAL (MISCELLANEOUS) IMPLANT
SPOT EX ENDOSCOPIC TATTOO (MISCELLANEOUS)
VARIJECT INJECTOR VIN23 (MISCELLANEOUS)
WATER STERILE IRR 250ML POUR (IV SOLUTION) ×4 IMPLANT
WIDE-EYE POLYPTRAP (MISCELLANEOUS) IMPLANT

## 2015-07-10 NOTE — Anesthesia Preprocedure Evaluation (Addendum)
Anesthesia Evaluation  Patient identified by MRN, date of birth, ID band Patient awake    Reviewed: Allergy & Precautions, H&P , NPO status , Patient's Chart, lab work & pertinent test results, reviewed documented beta blocker date and time   Airway Mallampati: II  TM Distance: >3 FB Neck ROM: full    Dental no notable dental hx.    Pulmonary former smoker,    Pulmonary exam normal breath sounds clear to auscultation       Cardiovascular Exercise Tolerance: Good hypertension,  Rhythm:regular Rate:Normal     Neuro/Psych PSYCHIATRIC DISORDERS negative neurological ROS     GI/Hepatic Neg liver ROS, GERD  Controlled and Medicated,  Endo/Other  negative endocrine ROS  Renal/GU negative Renal ROS  negative genitourinary   Musculoskeletal   Abdominal   Peds  Hematology  (+) anemia ,   Anesthesia Other Findings   Reproductive/Obstetrics negative OB ROS                            Anesthesia Physical Anesthesia Plan  ASA: II  Anesthesia Plan: MAC   Post-op Pain Management:    Induction:   Airway Management Planned:   Additional Equipment:   Intra-op Plan:   Post-operative Plan:   Informed Consent: I have reviewed the patients History and Physical, chart, labs and discussed the procedure including the risks, benefits and alternatives for the proposed anesthesia with the patient or authorized representative who has indicated his/her understanding and acceptance.     Plan Discussed with: CRNA  Anesthesia Plan Comments:         Anesthesia Quick Evaluation

## 2015-07-10 NOTE — Anesthesia Procedure Notes (Signed)
Procedure Name: MAC Performed by: Joycie Aerts Pre-anesthesia Checklist: Patient identified, Emergency Drugs available, Suction available, Timeout performed and Patient being monitored Patient Re-evaluated:Patient Re-evaluated prior to inductionOxygen Delivery Method: Nasal cannula Placement Confirmation: positive ETCO2     

## 2015-07-10 NOTE — Transfer of Care (Signed)
Immediate Anesthesia Transfer of Care Note  Patient: Kenneth Luna.  Procedure(s) Performed: Procedure(s) with comments: COLONOSCOPY WITH PROPOFOL (N/A) - PER CAREGIVER WAS TOLD THAT PT WOULD BE 1ST (KEEP PT EARLY AM) POLYPECTOMY  Patient Location: PACU  Anesthesia Type: MAC  Level of Consciousness: awake, alert  and patient cooperative  Airway and Oxygen Therapy: Patient Spontanous Breathing and Patient connected to supplemental oxygen  Post-op Assessment: Post-op Vital signs reviewed, Patient's Cardiovascular Status Stable, Respiratory Function Stable, Patent Airway and No signs of Nausea or vomiting  Post-op Vital Signs: Reviewed and stable  Complications: No apparent anesthesia complications

## 2015-07-10 NOTE — Anesthesia Postprocedure Evaluation (Signed)
Anesthesia Post Note  Patient: Kenneth Luna.  Procedure(s) Performed: Procedure(s) (LRB): COLONOSCOPY WITH PROPOFOL (N/A) POLYPECTOMY  Patient location during evaluation: PACU Anesthesia Type: MAC Level of consciousness: awake and alert Pain management: pain level controlled Vital Signs Assessment: post-procedure vital signs reviewed and stable Respiratory status: spontaneous breathing, nonlabored ventilation and respiratory function stable Cardiovascular status: blood pressure returned to baseline and stable Postop Assessment: no signs of nausea or vomiting Anesthetic complications: no    Tauni Sanks D Kendyn Zaman

## 2015-07-10 NOTE — H&P (Signed)
[facility]  [address], [place], [place] [address] Phone: [phone] Fax : [phone]  Primary Care Physician:  Kenneth Body, MD Primary Gastroenterologist:  Dr. Allen Luna  Pre-Procedure History & Physical: HPI:  Kenneth Luna. is a 82 y.o. male is here for an colonoscopy.   Past Medical History  Diagnosis Date  . Hypertension   . Glaucoma   . Colon polyp   . Diverticulosis   . Allergic rhinitis   . Substance abuse     alcohol  . Hyperlipidemia   . Anemia   . Dementia     memory issues  . GERD (gastroesophageal reflux disease)   . Fatty liver     Past Surgical History  Procedure Laterality Date  . No past surgeries      Prior to Admission medications   Medication Sig Start Date End Date Taking? Authorizing Provider  brimonidine-timolol (COMBIGAN) 0.2-0.5 % ophthalmic solution Place 1 drop into both eyes 2 (two) times daily.   Yes Historical Provider, MD  cetirizine (ZYRTEC) 10 MG tablet Take by mouth.   Yes Historical Provider, MD  Cholecalciferol (VITAMIN D-3 PO) Take by mouth.   Yes Historical Provider, MD  Cyanocobalamin (VITAMIN B 12 PO) Take by mouth.   Yes Historical Provider, MD  donepezil (ARICEPT) 10 MG tablet Take 10 mg by mouth at bedtime.   Yes Historical Provider, MD  fexofenadine (ALLEGRA) 180 MG tablet Take 180 mg by mouth daily as needed for allergies.    Yes Historical Provider, MD  fluticasone (FLONASE) 50 MCG/ACT nasal spray Place 2 sprays into both nostrils daily as needed for rhinitis.    Yes Historical Provider, MD  metoprolol succinate (TOPROL XL) 25 MG 24 hr tablet Take 1 tablet (25 mg total) by mouth daily. 06/24/15  Yes Kenneth Leslye Peer, MD  montelukast (SINGULAIR) 10 MG tablet Take 10 mg by mouth at bedtime.   Yes Historical Provider, MD  omeprazole (PRILOSEC) 20 MG capsule Take 1 capsule (20 mg total) by mouth daily. 06/24/15  Yes Kenneth Leslye Peer, MD  valsartan (DIOVAN) 80 MG tablet Take 80 mg by mouth daily.    Yes Historical Provider, MD  hydrocortisone (ANUSOL-HC) 25 MG suppository Place 1 suppository (25 mg total) rectally 2 (two) times daily. Patient not taking: Reported on 06/29/2015 06/24/15   Kenneth Grayer, MD    Allergies as of 06/30/2015  . (No Known Allergies)    History reviewed. No pertinent family history.  Social History   Social History  . Marital Status: Married    Spouse Name: N/A  . Number of Children: N/A  . Years of Education: N/A   Occupational History  . Not on file.   Social History Main Topics  . Smoking status: Former Smoker    Types: Pipe    Quit date: 04/18/1978  . Smokeless tobacco: Never Used  . Alcohol Use: 21.0 oz/week    35 Glasses of wine per week     Comment: 5 glass of wine daily  . Drug Use: No  . Sexual Activity: Not on file   Other Topics Concern  . Not on file   Social History Narrative    Review of Systems: See HPI, otherwise negative ROS  Physical Exam: Ht 5\' 5"  (1.651 m)  Wt 136 lb (61.689 kg)  BMI 22.63 kg/m2 General:   Alert,  pleasant and cooperative in NAD Head:  Normocephalic and atraumatic. Neck:  Supple; no masses or thyromegaly. Lungs:  Clear throughout to auscultation.  Heart:  Regular rate and rhythm. Abdomen:  Soft, nontender and nondistended. Normal bowel sounds, without guarding, and without rebound.   Neurologic:  Alert and  oriented x4;  grossly normal neurologically.  Impression/Plan: Kenneth Luna. is here for an colonoscopy to be performed for hematochezia  Risks, benefits, limitations, and alternatives regarding  colonoscopy have been reviewed with the patient.  Questions have been answered.  All parties agreeable.   Kenneth Bowl, MD  07/10/2015, 7:57 AM

## 2015-07-10 NOTE — Op Note (Signed)
[facility] Patient Name: Kenneth Luna Procedure Date: 07/10/2015 8:32 AM MRN: [id number] Account #: [id number] Date of Birth: 1932-10-12 Admit Type: Outpatient Age: 82 Room: [facility] OR ROOM 01 Gender: Male Note Status: Finalized Procedure:            Colonoscopy Indications:          Hematochezia Providers:            Lucilla Lame, MD Referring MD:         Dion Body (Referring MD) Medicines:            Propofol per Anesthesia Complications:        No immediate complications. Procedure:            Pre-Anesthesia Assessment:                       - Prior to the procedure, a History and Physical was                        performed, and patient medications and allergies were                        reviewed. The patient's tolerance of previous                        anesthesia was also reviewed. The risks and benefits of                        the procedure and the sedation options and risks were                        discussed with the patient. All questions were                        answered, and informed consent was obtained. Prior                        Anticoagulants: The patient has taken no previous                        anticoagulant or antiplatelet agents. ASA Grade                        Assessment: II - A patient with mild systemic disease.                        After reviewing the risks and benefits, the patient was                        deemed in satisfactory condition to undergo the                        procedure.                       After obtaining informed consent, the colonoscope was                        passed under direct vision. Throughout the procedure,  the patient's blood pressure, pulse, and oxygen                        saturations were monitored continuously. The was                        introduced through the anus and advanced to the the                        cecum, identified  by appendiceal orifice and ileocecal                        valve. The colonoscopy was performed without                        difficulty. The patient tolerated the procedure well.                        The quality of the bowel preparation was excellent. Findings:      The perianal and digital rectal examinations were normal.      A 3 mm polyp was found in the descending colon. The polyp was sessile.       The polyp was removed with a cold biopsy forceps. Resection and       retrieval were complete.      A 4 mm polyp was found in the ascending colon. The polyp was sessile.       The polyp was removed with a cold biopsy forceps. Resection and       retrieval were complete.      A frond-like/villous and ulcerated non-obstructing large mass was found       in the rectum. The mass was non-circumferential. The mass measured four       cm in length. No bleeding was present. This was biopsied with a cold       forceps for histology.      Multiple small-mouthed diverticula were found in the entire colon. Impression:           - One 3 mm polyp in the descending colon, removed with                        a cold biopsy forceps. Resected and retrieved.                       - One 4 mm polyp in the ascending colon, removed with a                        cold biopsy forceps. Resected and retrieved.                       - Likely malignant tumor in the rectum. Biopsied.                       - Diverticulosis in the entire examined colon. Recommendation:       - Await pathology results.                       - Return to my office in 2 weeks. Procedure Code(s):    --- Professional ---  45380, Colonoscopy, flexible; with biopsy, single or                        multiple Diagnosis Code(s):    --- Professional ---                       K92.1, Melena (includes Hematochezia)                       D12.4, Benign neoplasm of descending colon                       D12.2, Benign neoplasm of  ascending colon                       D49.0, Neoplasm of unspecified behavior of digestive                        system CPT copyright 2016 American Medical Association. All rights reserved. The codes documented in this report are preliminary and upon coder review may  be revised to meet current compliance requirements. Lucilla Lame, MD 07/10/2015 8:51:47 AM This report has been signed electronically. Number of Addenda: 0 Note Initiated On: 07/10/2015 8:32 AM Scope Withdrawal Time: 0 hours 6 minutes 37 seconds  Total Procedure Duration: 0 hours 12 minutes 1 second       [facility]

## 2015-07-13 ENCOUNTER — Encounter: Payer: Self-pay | Admitting: Gastroenterology

## 2015-07-14 ENCOUNTER — Telehealth: Payer: Self-pay

## 2015-07-14 LAB — SURGICAL PATHOLOGY

## 2015-07-14 NOTE — Telephone Encounter (Signed)
Dr. Luana Shu called wanting to let Dr. 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 Dr. Luana Shu will be calling us with Part B's results once she confirms it. After taking the call, I gave Ginger a call to let her know. She stated that she would let Dr. Allen Norris know of the result.

## 2015-07-15 ENCOUNTER — Encounter: Payer: Self-pay | Admitting: Surgery

## 2015-07-15 ENCOUNTER — Ambulatory Visit (INDEPENDENT_AMBULATORY_CARE_PROVIDER_SITE_OTHER): Payer: Medicare Other | Admitting: Surgery

## 2015-07-15 VITALS — BP 142/71 | HR 61 | Temp 98.2°F | Wt 136.0 lb

## 2015-07-15 DIAGNOSIS — C20 Malignant neoplasm of rectum: Secondary | ICD-10-CM

## 2015-07-15 HISTORY — DX: Malignant neoplasm of rectum: C20

## 2015-07-15 NOTE — Patient Instructions (Signed)
I would recommend that you see a Colorectal Surgeon to let you know what will be the best option for you.  We will refer you to [facility] and this will take about two weeks to give you a call for your appointment. If you don't hear from them in two weeks, please let us know.

## 2015-07-15 NOTE — Progress Notes (Signed)
Subjective:     Patient ID: Kenneth Bees., male   DOB: 10-02-1932, 81 y.o.   MRN: [id number]  HPI  82 year old male who is relatively healthy had episode of bleeding per rectum approximately 3 weeks prior. Patient underwent colonoscopy which that showed 2 tubular polyps and one in the transverse run in the descending colon but also showed a mass in the lower rectum that according to the report was about 4 cm in length became back positive for adenocarcinoma. Patient has not had any further bleeding isn't having any abdominal pain he doesn't have any nausea vomiting and he has not had any further diarrhea constipation or any other bleeding.  Past Medical History  Diagnosis Date  . Hypertension   . Glaucoma   . Colon polyp   . Diverticulosis   . Allergic rhinitis   . Substance abuse     alcohol  . Hyperlipidemia   . Anemia   . Dementia     memory issues  . GERD (gastroesophageal reflux disease)   . Fatty liver    Past Surgical History  Procedure Laterality Date  . No past surgeries    . Colonoscopy with propofol N/A 07/10/2015    Procedure: COLONOSCOPY WITH PROPOFOL;  Surgeon: Lucilla Lame, MD;  Location: [place];  Service: Endoscopy;  Laterality: N/A;  PER CAREGIVER WAS TOLD THAT PT WOULD BE 1ST (KEEP PT EARLY AM)  . Polypectomy  07/10/2015    Procedure: POLYPECTOMY;  Surgeon: Lucilla Lame, MD;  Location: [place];  Service: Endoscopy;;   History reviewed. No pertinent family history. Social History   Social History  . Marital Status: Married    Spouse Name: N/A  . Number of Children: N/A  . Years of Education: N/A   Social History Main Topics  . Smoking status: Former Smoker    Types: Pipe    Quit date: 04/18/1978  . Smokeless tobacco: Never Used  . Alcohol Use: 21.0 oz/week    35 Glasses of wine per week     Comment: 5 glass of wine daily  . Drug Use: No  . Sexual Activity: Not Asked   Other Topics Concern  . None   Social History  Narrative    Current outpatient prescriptions:  .  brimonidine-timolol (COMBIGAN) 0.2-0.5 % ophthalmic solution, Place 1 drop into both eyes 2 (two) times daily., Disp: , Rfl:  .  cetirizine (ZYRTEC) 10 MG tablet, Take by mouth., Disp: , Rfl:  .  Cholecalciferol (VITAMIN D-3 PO), Take by mouth., Disp: , Rfl:  .  Cyanocobalamin (VITAMIN B 12 PO), Take by mouth., Disp: , Rfl:  .  donepezil (ARICEPT) 10 MG tablet, Take 10 mg by mouth at bedtime., Disp: , Rfl:  .  fexofenadine (ALLEGRA) 180 MG tablet, Take 180 mg by mouth daily as needed for allergies. , Disp: , Rfl:  .  fluticasone (FLONASE) 50 MCG/ACT nasal spray, Place 2 sprays into both nostrils daily as needed for rhinitis. , Disp: , Rfl:  .  hydrocortisone (ANUSOL-HC) 25 MG suppository, Place 1 suppository (25 mg total) rectally 2 (two) times daily., Disp: 12 suppository, Rfl: 0 .  metoprolol succinate (TOPROL XL) 25 MG 24 hr tablet, Take 1 tablet (25 mg total) by mouth daily., Disp: 30 tablet, Rfl: 0 .  montelukast (SINGULAIR) 10 MG tablet, Take 10 mg by mouth at bedtime., Disp: , Rfl:  .  mupirocin ointment (BACTROBAN) 2 %, Apply 1 application topically., Disp: , Rfl: 0 .  omeprazole (  PRILOSEC) 20 MG capsule, Take 1 capsule (20 mg total) by mouth daily., Disp: 30 capsule, Rfl: 0 .  valsartan (DIOVAN) 80 MG tablet, Take 80 mg by mouth daily., Disp: , Rfl:  No Known Allergies     Review of Systems  Constitutional: Negative for fever, chills, activity change, appetite change, fatigue and unexpected weight change.  HENT: Negative for congestion and nosebleeds.   Respiratory: Negative for cough and shortness of breath.   Cardiovascular: Negative for chest pain.  Gastrointestinal: Negative for nausea, vomiting, abdominal pain, diarrhea and constipation.  Genitourinary: Negative for dysuria and hematuria.  Musculoskeletal: Negative for arthralgias.  Neurological: Negative for dizziness and weakness.  Psychiatric/Behavioral: Negative for  agitation.  All other systems reviewed and are negative.      Filed Vitals:   07/15/15 1336  BP: 142/71  Pulse: 61  Temp: 98.2 F (36.8 C)    Objective:   Physical Exam  Constitutional: He is oriented to person, place, and time. He appears well-developed and well-nourished. No distress.  Cardiovascular: Normal rate, regular rhythm, normal heart sounds and intact distal pulses.   Pulmonary/Chest: Effort normal and breath sounds normal. No respiratory distress. He has no wheezes.  Abdominal: Soft. Bowel sounds are normal. He exhibits no distension. There is no tenderness.  Genitourinary:  Rectum: prostate slight enlargement no nodules, 3cm into anus on posterior rectum, palpable lesion, partially mobile, slightly tender  Musculoskeletal: Normal range of motion.  Neurological: He is alert and oriented to person, place, and time.  Skin: Skin is warm and dry.  Psychiatric: He has a normal mood and affect. His behavior is normal. Judgment and thought content normal.  Vitals reviewed.      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 Also personally reviewed his colonoscopy report. Given how low it is approximately 3 cm up from the anal verge would recommend that he go to a colorectal surgeon to have this removed. They may be able to offer him transanal removal versus APR for this given his age. Discussed this with patient and his stepdaughter who are in agreement. Will put in the referral for colorectal surgery and get him appointment as soon as possible.

## 2015-07-17 ENCOUNTER — Encounter: Payer: Self-pay | Admitting: Dietician

## 2015-07-17 ENCOUNTER — Encounter: Payer: Medicare Other | Attending: Family Medicine | Admitting: Dietician

## 2015-07-17 VITALS — Ht 65.0 in | Wt 135.0 lb

## 2015-07-17 DIAGNOSIS — K760 Fatty (change of) liver, not elsewhere classified: Secondary | ICD-10-CM | POA: Diagnosis present

## 2015-07-17 DIAGNOSIS — I10 Essential (primary) hypertension: Secondary | ICD-10-CM

## 2015-07-17 NOTE — Progress Notes (Signed)
Medical Nutrition Therapy: Visit start time: [id number]  end time: [id number] Assessment:  Diagnosis: fatty liver, hypertension  Medical history: B12 deficiency, dementia, hyperlipidemia, ETOH dependence; diagnosis this week of rectal cancer. Psychosocial issues/ stress concerns: Patient rates his stress as "moderate" and indicates "ok" as to how well he is dealing with his stress. Preferred learning method:  . Visual  Current weight: 135 lbs  Height: 65 in Medications, supplements: see list Progress and evaluation:  Patient accompanied by his caregiver, Holley Raring, in for initial medical nutrition therapy appointment. He reports his weight has been stable for several years. Most of his meals are eaten "out". Glenda reports that his serum sodium was low so he was told to drink gaterade at breakfast which he has done. He eats small portions finishing his breakfast meal at lunch. His diet is low in fruits/vegetables and calcium sources. His protein intake is possibly low on some days. He reports he drinks 4oz of wine at least 7 times per day for total of approximately 650 calories which is 40% of calories needed to maintain weight.He takes a B12 vitamin but no other vitamins/minerals. Sources of saturated fat in his diet are cheese (2-3x/day), ice cream, ham and cream sauces.   Physical activity: works out on [employer]" at [employer] 3 days/week for 30 minutes  Dietary Intake:  Usual eating pattern includes 3 meals and 2 snacks per day. Dining out frequency: all meals are eaten "out"  Breakfast: 9:30- [place], egg, bacon or sausage, cheese biscuit, 1/4 blueberry muffin or grilled chicken, biscuit, mixed fruit Lunch: 12:00 - eats what he did not finish at breakfast Snack: cheese/crackers, wine, coffee Supper: 4:30pm- [employer] restaurant- chicken, lettuce, tomato, mayo with small amount of pasta or potato salad or soup and grilled cheese sandwich or ham/turkey, cheese on ww bread Snack: ice cream and  oreos Beverages: water, coffee, wine 7 (4oz) servings per day.  Nutrition Care Education: Basic Nutrition: Discussed need for adequate protein to maintain muscle strength especially with upcoming surgery. Also, discussed food group servings recommended to meet nutrient needs. Fatty Liver: Instructed patient and caregiver on dietary guidelines for fatty liver including guidelines for saturated and trans fat as well as recommendations for fruits/vegetables, grains and healthy fats. Obtained typical day's pattern and worked with patient and sitter on changes that could be made to lower saturated and trans fat as well as increase fruits and vegetables.  Discussed how excessive alcohol will increase fat in the liver and how some of this can possibly be reversed if he decreases alcohol intake.  Hypertension:  Discussed dietary guidelines for hypertension but did not focus on sodium since caregiver states patient was encouraged to increase sodium by drinking gaterade. Nutritional Diagnosis:  NI-5.11.1 Predicted suboptimal nutrient intake As related to 40% of calories contributed by alcohol and possible poor absorption of vitamins and minerals due to excessive alcohol .  As evidenced by diet history..  Intervention:  Switch from biscuit to English muffin.  Choose grilled chicken, egg, fruit at breakfast.  Can also choose egg/cheese English muffin. For afternoon snack include a fruit with peanut butter or peanuts or pecans. Limit cheese at that snack since you have cheese at other meals.  Limit cheese at evening meal or other sources of animal fat. Include a vegetable with as many dinner meals as possible. Include at least 6-7 oz. of a protein food. Refer to  list Take a multi-vitamin. Begin to decrease wine intake.  Read labels for saturated fat and trans fat.  Limit saturate fat to no more than 12 gm/day. Trans-0 gms/day Refer to hand-out for list of foods to limit and avoid. Try to add a fruit or  vegetable or both to meals.  Education Materials given:  Marland Kitchen Dietary guidelines for fatty liver . List of foods to limit or avoid due to saturated and trans fat content . Planning a Balanced meal . Goals/ instructions  Learner/ who was taught:  . Patient  . Caregiver/ guardian: Holley Raring  Level of understanding: . Partial understanding; needs review/ practice Learning barriers: . Cognitive limitations for patient Willingness to learn/ readiness for change: . Acceptance, ready for change Monitoring and Evaluation:  Dietary intake, exercise,  and body weight      follow up: 08/14/15 at 1:15pm

## 2015-07-17 NOTE — Patient Instructions (Signed)
Switch from biscuit to English muffin.  Choose grilled chicken, egg, fruit at breakfast.  Can also choose egg/cheese English muffin. For afternoon snack include a fruit with peanut butter or peanuts or pecans. Limit cheese at that snack since you have cheese at other meals.  Limit cheese at evening meal or other sources of animal fat. Include a vegetable with as many dinner meals as possible. Include at least 6-7 oz. of a protein food. Refer to  list Take a multi-vitamin. Begin to decrease wine intake.  Read labels for saturated fat and trans fat. Limit saturate fat to no more than 12 gm/day. Trans-0 gms/day Refer to hand-out for list of foods to limit and avoid. Try to add a fruit or vegetable or both to meals.

## 2015-07-23 ENCOUNTER — Emergency Department
Admission: EM | Admit: 2015-07-23 | Discharge: 2015-07-23 | Payer: Medicare Other | Attending: Emergency Medicine | Admitting: Emergency Medicine

## 2015-07-23 ENCOUNTER — Emergency Department: Payer: Medicare Other

## 2015-07-23 DIAGNOSIS — Y939 Activity, unspecified: Secondary | ICD-10-CM | POA: Diagnosis not present

## 2015-07-23 DIAGNOSIS — S06300A Unspecified focal traumatic brain injury without loss of consciousness, initial encounter: Secondary | ICD-10-CM | POA: Diagnosis not present

## 2015-07-23 DIAGNOSIS — Z85038 Personal history of other malignant neoplasm of large intestine: Secondary | ICD-10-CM | POA: Insufficient documentation

## 2015-07-23 DIAGNOSIS — I10 Essential (primary) hypertension: Secondary | ICD-10-CM | POA: Diagnosis not present

## 2015-07-23 DIAGNOSIS — W01198A Fall on same level from slipping, tripping and stumbling with subsequent striking against other object, initial encounter: Secondary | ICD-10-CM | POA: Insufficient documentation

## 2015-07-23 DIAGNOSIS — Y929 Unspecified place or not applicable: Secondary | ICD-10-CM | POA: Diagnosis not present

## 2015-07-23 DIAGNOSIS — R42 Dizziness and giddiness: Secondary | ICD-10-CM | POA: Diagnosis present

## 2015-07-23 DIAGNOSIS — Z87891 Personal history of nicotine dependence: Secondary | ICD-10-CM | POA: Insufficient documentation

## 2015-07-23 DIAGNOSIS — I629 Nontraumatic intracranial hemorrhage, unspecified: Secondary | ICD-10-CM

## 2015-07-23 DIAGNOSIS — K219 Gastro-esophageal reflux disease without esophagitis: Secondary | ICD-10-CM | POA: Insufficient documentation

## 2015-07-23 DIAGNOSIS — Y999 Unspecified external cause status: Secondary | ICD-10-CM | POA: Diagnosis not present

## 2015-07-23 LAB — CBC WITH DIFFERENTIAL/PLATELET
Basophils Absolute: 0.1 10*3/uL (ref 0–0.1)
Basophils Relative: 1 %
EOS ABS: 0 10*3/uL (ref 0–0.7)
Eosinophils Relative: 0 %
HEMATOCRIT: 41.5 % (ref 40.0–52.0)
Hemoglobin: 14.2 g/dL (ref 13.0–18.0)
LYMPHS ABS: 1.1 10*3/uL (ref 1.0–3.6)
LYMPHS PCT: 16 %
MCH: 32.4 pg (ref 26.0–34.0)
MCHC: 34.3 g/dL (ref 32.0–36.0)
MCV: 94.4 fL (ref 80.0–100.0)
Monocytes Absolute: 0.7 10*3/uL (ref 0.2–1.0)
Monocytes Relative: 10 %
NEUTROS ABS: 4.8 10*3/uL (ref 1.4–6.5)
NEUTROS PCT: 73 %
Platelets: 152 10*3/uL (ref 150–440)
RBC: 4.4 MIL/uL (ref 4.40–5.90)
RDW: 13.1 % (ref 11.5–14.5)
WBC: 6.7 10*3/uL (ref 3.8–10.6)

## 2015-07-23 LAB — URINALYSIS COMPLETE WITH MICROSCOPIC (ARMC ONLY)
BILIRUBIN URINE: NEGATIVE
Bacteria, UA: NONE SEEN
Glucose, UA: NEGATIVE mg/dL
Leukocytes, UA: NEGATIVE
NITRITE: NEGATIVE
PH: 5 (ref 5.0–8.0)
Protein, ur: 100 mg/dL — AB
Specific Gravity, Urine: 1.026 (ref 1.005–1.030)

## 2015-07-23 LAB — BASIC METABOLIC PANEL
ANION GAP: 13 (ref 5–15)
BUN: 16 mg/dL (ref 6–20)
CALCIUM: 10 mg/dL (ref 8.9–10.3)
CHLORIDE: 100 mmol/L — AB (ref 101–111)
CO2: 22 mmol/L (ref 22–32)
Creatinine, Ser: 0.69 mg/dL (ref 0.61–1.24)
GFR calc non Af Amer: >60 mL/min (ref >60)
Glucose, Bld: 103 mg/dL — ABNORMAL HIGH (ref 65–99)
POTASSIUM: 4.4 mmol/L (ref 3.5–5.1)
Sodium: 135 mmol/L (ref 135–145)

## 2015-07-23 LAB — TROPONIN I: Troponin I: <0.03 ng/mL (ref <0.031)

## 2015-07-23 LAB — APTT: APTT: 32 s (ref 24–36)

## 2015-07-23 LAB — PROTIME-INR
INR: 1.21
Prothrombin Time: 15.5 seconds — ABNORMAL HIGH (ref 11.4–15.0)

## 2015-07-23 MED ORDER — HYDRALAZINE HCL 20 MG/ML IJ SOLN
10.0000 mg | Freq: Once | INTRAMUSCULAR | Status: AC
  Administered 2015-07-23: 10 mg via INTRAVENOUS

## 2015-07-23 MED ORDER — HYDRALAZINE HCL 20 MG/ML IJ SOLN
10.0000 mg | Freq: Once | INTRAMUSCULAR | Status: AC
  Administered 2015-07-23: 10 mg via INTRAVENOUS
  Filled 2015-07-23: qty 1

## 2015-07-23 MED ORDER — METOPROLOL TARTRATE 1 MG/ML IV SOLN
5.0000 mg | Freq: Once | INTRAVENOUS | Status: AC
  Administered 2015-07-23: 5 mg via INTRAVENOUS
  Filled 2015-07-23: qty 5

## 2015-07-23 NOTE — ED Notes (Signed)
Pt unsure how he fell Tuesday but has had dizziness since, states was seen by PCP yesterday but today having a HA with the dizziness.

## 2015-07-23 NOTE — ED Notes (Signed)
Report called to [facility] via transfer coordinator Shanon Brow

## 2015-07-23 NOTE — ED Notes (Signed)
MD Goodman at bedside. 

## 2015-07-23 NOTE — ED Notes (Addendum)
Pt states he fell on Tuesday and had no problems until today. States he has felt dizzy today. Also said he had a headache but he took Aleve and he got relief from his headache. Pt also has a dry nonproductive cough present.

## 2015-07-23 NOTE — ED Provider Notes (Signed)
[facility] Emergency Department Provider Note    ____________________________________________  Time seen:~1740  I have reviewed the triage vital signs and the nursing notes.   HISTORY  Chief Complaint Dizziness and Fall   History limited by: Not Limited   HPI Kenneth Luna. is a 82 y.o. male who presents to the emergency department today because of sensation of dizziness and headache today in the setting of a recent fall. The patient states that he fell 2 days ago. He thinks he tripped on something. He did hit his head although he denies any loss of consciousness. He sells primary care doctor yesterday however he was asymptomatic at that time. He states this afternoon he started developing dizziness. He states was worse when he stood up. No addition he had associated headache. He states it was in the front part of his head. The headache has improved after the patient took Advil. He denies being on any blood thinners.    Past Medical History  Diagnosis Date  . Hypertension   . Glaucoma   . Colon polyp   . Diverticulosis   . Allergic rhinitis   . Substance abuse     alcohol  . Hyperlipidemia   . Anemia   . Dementia     memory issues  . GERD (gastroesophageal reflux disease)   . Fatty liver     Patient Active Problem List   Diagnosis Date Noted  . Rectal adenocarcinoma ([place]) 07/15/2015  . Benign neoplasm of descending colon   . Benign neoplasm of ascending colon   . Neoplasm of digestive system   . B12 deficiency 06/29/2015  . Colon polyp 06/29/2015  . Dementia 06/29/2015  . DD (diverticular disease) 06/29/2015  . Essential (primary) hypertension 06/29/2015  . Glaucoma 06/29/2015  . Abuse, drug or alcohol 06/29/2015  . Acute posthemorrhagic anemia 06/23/2015  . GI bleed 06/23/2015  . Hypotension 06/23/2015  . Hyponatremia 06/23/2015  . Elevated transaminase level 06/23/2015    Past Surgical History  Procedure Laterality Date   . No past surgeries    . Colonoscopy with propofol N/A 07/10/2015    Procedure: COLONOSCOPY WITH PROPOFOL;  Surgeon: Lucilla Lame, MD;  Location: [place];  Service: Endoscopy;  Laterality: N/A;  PER CAREGIVER WAS TOLD THAT PT WOULD BE 1ST (KEEP PT EARLY AM)  . Polypectomy  07/10/2015    Procedure: POLYPECTOMY;  Surgeon: Lucilla Lame, MD;  Location: [place];  Service: Endoscopy;;    Current Outpatient Rx  Name  Route  Sig  Dispense  Refill  . brimonidine-timolol (COMBIGAN) 0.2-0.5 % ophthalmic solution   Both Eyes   Place 1 drop into both eyes 2 (two) times daily.         . cetirizine (ZYRTEC) 10 MG tablet   Oral   Take by mouth.         . Cholecalciferol (VITAMIN D-3 PO)   Oral   Take by mouth.         . Cyanocobalamin (VITAMIN B 12 PO)   Oral   Take by mouth.         . donepezil (ARICEPT) 10 MG tablet   Oral   Take 10 mg by mouth at bedtime.         . fexofenadine (ALLEGRA) 180 MG tablet   Oral   Take 180 mg by mouth daily as needed for allergies.          . fluticasone (FLONASE) 50 MCG/ACT nasal spray  Each Nare   Place 2 sprays into both nostrils daily as needed for rhinitis.          . hydrocortisone (ANUSOL-HC) 25 MG suppository   Rectal   Place 1 suppository (25 mg total) rectally 2 (two) times daily.   12 suppository   0   . metoprolol succinate (TOPROL XL) 25 MG 24 hr tablet   Oral   Take 1 tablet (25 mg total) by mouth daily.   30 tablet   0   . montelukast (SINGULAIR) 10 MG tablet   Oral   Take 10 mg by mouth at bedtime.         . mupirocin ointment (BACTROBAN) 2 %   Topical   Apply 1 application topically.      0   . omeprazole (PRILOSEC) 20 MG capsule   Oral   Take 1 capsule (20 mg total) by mouth daily.   30 capsule   0   . valsartan (DIOVAN) 80 MG tablet   Oral   Take 80 mg by mouth daily.           Allergies Review of patient's allergies indicates no known allergies.  No family history on  file.  Social History Social History  Substance Use Topics  . Smoking status: Former Smoker    Types: Pipe    Quit date: 04/18/1978  . Smokeless tobacco: Never Used  . Alcohol Use: 21.0 oz/week    35 Glasses of wine per week     Comment: 5 glass of wine daily    Review of Systems  Constitutional: Negative for fever. Cardiovascular: Negative for chest pain. Respiratory: Negative for shortness of breath. Gastrointestinal: Negative for abdominal pain, vomiting and diarrhea. Neurological: Positive for headache and dizziness.   10-point ROS otherwise negative.  ____________________________________________   PHYSICAL EXAM:  VITAL SIGNS: ED Triage Vitals  Enc Vitals Group     BP 07/23/15 1705 173/84 mmHg     Pulse Rate 07/23/15 1704 85     Resp 07/23/15 1704 17     Temp 07/23/15 1704 98 F (36.7 C)     Temp Source 07/23/15 1704 Oral     SpO2 07/23/15 1704 98 %     Weight 07/23/15 1704 135 lb (61.236 kg)     Height 07/23/15 1704 5\' 5"  (1.651 m)     Head Cir --      Peak Flow --      Pain Score 07/23/15 1705 0   Constitutional: Alert and oriented. Well appearing and in no distress. Eyes: Conjunctivae are normal. PERRL. Normal extraocular movements. ENT   Head: Normocephalic and atraumatic. No hemotympanum.    Nose: No congestion/rhinnorhea.   Mouth/Throat: Mucous membranes are moist.   Neck: No stridor. Hematological/Lymphatic/Immunilogical: No cervical lymphadenopathy. Cardiovascular: Normal rate, regular rhythm.  No murmurs, rubs, or gallops. Respiratory: Normal respiratory effort without tachypnea nor retractions. Breath sounds are clear and equal bilaterally. No wheezes/rales/rhonchi. Gastrointestinal: Soft and nontender. No distention.  Genitourinary: Deferred Musculoskeletal: Normal range of motion in all extremities. No joint effusions.  No lower extremity tenderness nor edema. Neurologic:  Normal speech and language. Face symmetric. PERRL. EOMI.  Tongue midline. Strength 5/5 in upper and lower extremities. Sensation grossly intact. Finger to nose normal.  No gross focal neurologic deficits are appreciated.  Skin:  Skin is warm, dry and intact. No rash noted. Psychiatric: Mood and affect are normal. Speech and behavior are normal. Patient exhibits appropriate insight and judgment.  ____________________________________________  LABS (pertinent positives/negatives)  Labs Reviewed  BASIC METABOLIC PANEL - Abnormal; Notable for the following:    Chloride 100 (*)    Glucose, Bld 103 (*)    All other components within normal limits  URINALYSIS COMPLETEWITH MICROSCOPIC ([facility] ONLY) - Abnormal; Notable for the following:    Color, Urine AMBER (*)    APPearance CLEAR (*)    Ketones, ur 1+ (*)    Hgb urine dipstick 1+ (*)    Protein, ur 100 (*)    Squamous Epithelial / LPF 0-5 (*)    All other components within normal limits  PROTIME-INR - Abnormal; Notable for the following:    Prothrombin Time 15.5 (*)    All other components within normal limits  CBC WITH DIFFERENTIAL/PLATELET  TROPONIN I  APTT     ____________________________________________   EKG  None  ____________________________________________    RADIOLOGY  CT head IMPRESSION: There is high-density material in right basal ganglia suspicious for hemorrhage. Measures about 9.8 cm. No definite mass effect or midline shift. A vascular malformation in right basal ganglia cannot be excluded. Further correlation with MRI is recommended. No intraventricular hemorrhage. No definite acute cortical infarction. Mild cerebral atrophy. Mild periventricular white matter decreased attenuation probable due to chronic small vessel ischemic changes.  I, Nance Pear, personally discussed these images and results by phone with the on-call radiologist and used this discussion as part of my medical decision making.    ____________________________________________   PROCEDURES  Procedure(s) performed: None  Critical Care performed: Yes, see critical care note(s)  CRITICAL CARE Performed by: Nance Pear   Total critical care time: 35 minutes  Critical care time was exclusive of separately billable procedures and treating other patients.  Critical care was necessary to treat or prevent imminent or life-threatening deterioration.  Critical care was time spent personally by me on the following activities: development of treatment plan with patient and/or surrogate as well as nursing, discussions with consultants, evaluation of patient's response to treatment, examination of patient, obtaining history from patient or surrogate, ordering and performing treatments and interventions, ordering and review of laboratory studies, ordering and review of radiographic studies, pulse oximetry and re-evaluation of patient's condition.  ____________________________________________   INITIAL IMPRESSION / ASSESSMENT AND PLAN / ED COURSE  Pertinent labs & imaging results that were available during my care of the patient were reviewed by me and considered in my medical decision making (see chart for details).  Patient presented to the emergency department today because of concerns for headache and dizziness after a fall 2 days ago. On exam patient without any focal neuro deficits. CT scan was obtained which was concerning for acute intracranial hemorrhage. I discussed this finding with the patient. Patient's blood pressure was minimally elevated over 160 so he was given multiple IV medications to try to maintain systolic blood pressure around 160. I discussed with Dr. Clint Lipps with [facility] neurosurgery who accepted the patient in transfer.  ____________________________________________   FINAL CLINICAL IMPRESSION(S) / ED DIAGNOSES  Final diagnoses:  Intracranial hemorrhage ([place])     Nance Pear, MD 07/23/15  [id number]

## 2015-07-23 NOTE — ED Notes (Signed)
Patient transported to CT 

## 2015-07-23 NOTE — ED Notes (Signed)
Called [facility] transfer Center spoke to [place]

## 2015-07-24 DIAGNOSIS — I619 Nontraumatic intracerebral hemorrhage, unspecified: Secondary | ICD-10-CM | POA: Insufficient documentation

## 2015-08-03 ENCOUNTER — Telehealth: Payer: Self-pay | Admitting: Surgery

## 2015-08-03 NOTE — Telephone Encounter (Signed)
Called Glenda back to let her know that patient has an appointment with [facility] Surgery on 08/25/2015 with:  08/25/2015 Appointment General Surgery Cathie Beams, MD  [address]  [phone] [address]  [place], [place] [address]  [phone]  [phone] (Fax)    I gave Holley Raring this information.

## 2015-08-03 NOTE — Telephone Encounter (Signed)
Please call caregiver, Holley Raring at [phone]. She needed to know if he is having surgery or go to the oncologist.

## 2015-08-08 ENCOUNTER — Encounter (HOSPITAL_COMMUNITY): Payer: Self-pay | Admitting: Emergency Medicine

## 2015-08-08 ENCOUNTER — Emergency Department (HOSPITAL_COMMUNITY): Payer: Medicare Other

## 2015-08-08 ENCOUNTER — Observation Stay (HOSPITAL_COMMUNITY)
Admission: EM | Admit: 2015-08-08 | Discharge: 2015-08-09 | Disposition: A | Discharge status: Home Health | Payer: Medicare Other | Attending: Internal Medicine | Admitting: Internal Medicine

## 2015-08-08 DIAGNOSIS — K922 Gastrointestinal hemorrhage, unspecified: Secondary | ICD-10-CM | POA: Diagnosis not present

## 2015-08-08 DIAGNOSIS — C20 Malignant neoplasm of rectum: Secondary | ICD-10-CM | POA: Diagnosis not present

## 2015-08-08 DIAGNOSIS — I10 Essential (primary) hypertension: Secondary | ICD-10-CM | POA: Insufficient documentation

## 2015-08-08 DIAGNOSIS — K219 Gastro-esophageal reflux disease without esophagitis: Secondary | ICD-10-CM | POA: Diagnosis not present

## 2015-08-08 DIAGNOSIS — K5732 Diverticulitis of large intestine without perforation or abscess without bleeding: Secondary | ICD-10-CM | POA: Diagnosis not present

## 2015-08-08 DIAGNOSIS — Z87891 Personal history of nicotine dependence: Secondary | ICD-10-CM | POA: Diagnosis not present

## 2015-08-08 DIAGNOSIS — R531 Weakness: Secondary | ICD-10-CM

## 2015-08-08 DIAGNOSIS — J309 Allergic rhinitis, unspecified: Secondary | ICD-10-CM | POA: Diagnosis not present

## 2015-08-08 DIAGNOSIS — D649 Anemia, unspecified: Secondary | ICD-10-CM | POA: Insufficient documentation

## 2015-08-08 DIAGNOSIS — F1020 Alcohol dependence, uncomplicated: Secondary | ICD-10-CM | POA: Diagnosis not present

## 2015-08-08 DIAGNOSIS — E538 Deficiency of other specified B group vitamins: Secondary | ICD-10-CM | POA: Diagnosis not present

## 2015-08-08 DIAGNOSIS — E785 Hyperlipidemia, unspecified: Secondary | ICD-10-CM | POA: Diagnosis not present

## 2015-08-08 DIAGNOSIS — Z79899 Other long term (current) drug therapy: Secondary | ICD-10-CM | POA: Diagnosis not present

## 2015-08-08 DIAGNOSIS — R55 Syncope and collapse: Secondary | ICD-10-CM | POA: Insufficient documentation

## 2015-08-08 DIAGNOSIS — K5792 Diverticulitis of intestine, part unspecified, without perforation or abscess without bleeding: Secondary | ICD-10-CM | POA: Diagnosis not present

## 2015-08-08 DIAGNOSIS — R1032 Left lower quadrant pain: Secondary | ICD-10-CM | POA: Diagnosis present

## 2015-08-08 DIAGNOSIS — K5733 Diverticulitis of large intestine without perforation or abscess with bleeding: Secondary | ICD-10-CM | POA: Diagnosis not present

## 2015-08-08 DIAGNOSIS — F0390 Unspecified dementia without behavioral disturbance: Secondary | ICD-10-CM | POA: Insufficient documentation

## 2015-08-08 DIAGNOSIS — K5721 Diverticulitis of large intestine with perforation and abscess with bleeding: Secondary | ICD-10-CM

## 2015-08-08 HISTORY — DX: Unspecified focal traumatic brain injury with loss of consciousness of unspecified duration, initial encounter: S06.309A

## 2015-08-08 HISTORY — DX: Malignant neoplasm of rectum: C20

## 2015-08-08 HISTORY — DX: Benign neoplasm of ascending colon: D12.2

## 2015-08-08 HISTORY — DX: Deficiency of other specified B group vitamins: E53.8

## 2015-08-08 HISTORY — DX: Diverticulosis of intestine, part unspecified, without perforation or abscess without bleeding: K57.90

## 2015-08-08 HISTORY — DX: Benign neoplasm of descending colon: D12.4

## 2015-08-08 HISTORY — DX: Unspecified focal traumatic brain injury with loss of consciousness status unknown, initial encounter: S06.30AA

## 2015-08-08 LAB — BASIC METABOLIC PANEL
ANION GAP: 13 (ref 5–15)
BUN: 8 mg/dL (ref 6–20)
CALCIUM: 9.3 mg/dL (ref 8.9–10.3)
CHLORIDE: 104 mmol/L (ref 101–111)
CO2: 20 mmol/L — ABNORMAL LOW (ref 22–32)
CREATININE: 0.87 mg/dL (ref 0.61–1.24)
GFR calc non Af Amer: >60 mL/min (ref >60)
Glucose, Bld: 124 mg/dL — ABNORMAL HIGH (ref 65–99)
Potassium: 4.4 mmol/L (ref 3.5–5.1)
SODIUM: 137 mmol/L (ref 135–145)

## 2015-08-08 LAB — CBC
HCT: 38 % — ABNORMAL LOW (ref 39.0–52.0)
HEMOGLOBIN: 12.3 g/dL — AB (ref 13.0–17.0)
MCH: 30.9 pg (ref 26.0–34.0)
MCHC: 32.4 g/dL (ref 30.0–36.0)
MCV: 95.5 fL (ref 78.0–100.0)
PLATELETS: 280 10*3/uL (ref 150–400)
RBC: 3.98 MIL/uL — AB (ref 4.22–5.81)
RDW: 12.7 % (ref 11.5–15.5)
WBC: 8.8 10*3/uL (ref 4.0–10.5)

## 2015-08-08 LAB — DIFFERENTIAL
BASOS ABS: 0.1 10*3/uL (ref 0.0–0.1)
Basophils Relative: 1 %
Eosinophils Absolute: 0.1 10*3/uL (ref 0.0–0.7)
Eosinophils Relative: 1 %
LYMPHS PCT: 14 %
Lymphs Abs: 1.2 10*3/uL (ref 0.7–4.0)
Monocytes Absolute: 0.7 10*3/uL (ref 0.1–1.0)
Monocytes Relative: 8 %
NEUTROS ABS: 6.6 10*3/uL (ref 1.7–7.7)
NEUTROS PCT: 76 %

## 2015-08-08 LAB — URINALYSIS, ROUTINE W REFLEX MICROSCOPIC
Bilirubin Urine: NEGATIVE
Glucose, UA: NEGATIVE mg/dL
Hgb urine dipstick: NEGATIVE
Ketones, ur: 15 mg/dL — AB
LEUKOCYTES UA: NEGATIVE
Nitrite: NEGATIVE
PROTEIN: NEGATIVE mg/dL
SPECIFIC GRAVITY, URINE: 1.033 — AB (ref 1.005–1.030)
pH: 5 (ref 5.0–8.0)

## 2015-08-08 LAB — POC OCCULT BLOOD, ED: Fecal Occult Bld: POSITIVE — AB

## 2015-08-08 LAB — HEPATIC FUNCTION PANEL
ALK PHOS: 54 U/L (ref 38–126)
ALT: 37 U/L (ref 17–63)
AST: 51 U/L — ABNORMAL HIGH (ref 15–41)
Albumin: 2.8 g/dL — ABNORMAL LOW (ref 3.5–5.0)
BILIRUBIN DIRECT: 0.2 mg/dL (ref 0.1–0.5)
BILIRUBIN INDIRECT: 0.4 mg/dL (ref 0.3–0.9)
BILIRUBIN TOTAL: 0.6 mg/dL (ref 0.3–1.2)
TOTAL PROTEIN: 6.6 g/dL (ref 6.5–8.1)

## 2015-08-08 LAB — LIPASE, BLOOD: Lipase: 44 U/L (ref 11–51)

## 2015-08-08 MED ORDER — ONDANSETRON HCL 4 MG PO TABS: 4.0000 mg | ORAL_TABLET | Freq: Four times a day (QID) | ORAL | Status: DC | PRN

## 2015-08-08 MED ORDER — CIPROFLOXACIN IN D5W 400 MG/200ML IV SOLN
400.0000 mg | Freq: Two times a day (BID) | INTRAVENOUS | Status: DC
  Administered 2015-08-09: 400 mg via INTRAVENOUS
  Filled 2015-08-08 (×3): qty 200

## 2015-08-08 MED ORDER — DONEPEZIL HCL 10 MG PO TABS
10.0000 mg | ORAL_TABLET | Freq: Every day | ORAL | Status: DC
  Administered 2015-08-08: 10 mg via ORAL
  Filled 2015-08-08: qty 1

## 2015-08-08 MED ORDER — VITAMIN D3 25 MCG (1000 UNIT) PO TABS
1000.0000 [IU] | ORAL_TABLET | Freq: Every day | ORAL | Status: DC
  Filled 2015-08-08: qty 1

## 2015-08-08 MED ORDER — FOLIC ACID 1 MG PO TABS
1.0000 mg | ORAL_TABLET | Freq: Every day | ORAL | Status: DC
  Administered 2015-08-09: 1 mg via ORAL
  Filled 2015-08-08: qty 1

## 2015-08-08 MED ORDER — VITAMIN B-1 100 MG PO TABS
100.0000 mg | ORAL_TABLET | Freq: Every day | ORAL | Status: DC
  Administered 2015-08-09: 100 mg via ORAL
  Filled 2015-08-08: qty 1

## 2015-08-08 MED ORDER — VITAMIN B-12 1000 MCG PO TABS
1000.0000 ug | ORAL_TABLET | Freq: Every day | ORAL | Status: DC
  Administered 2015-08-09: 1000 ug via ORAL
  Filled 2015-08-08: qty 1

## 2015-08-08 MED ORDER — IOPAMIDOL (ISOVUE-300) INJECTION 61%
INTRAVENOUS | Status: AC
  Administered 2015-08-08: 100 mL
  Filled 2015-08-08: qty 100

## 2015-08-08 MED ORDER — OXYCODONE HCL 5 MG PO TABS: 5.0000 mg | ORAL_TABLET | ORAL | Status: DC | PRN

## 2015-08-08 MED ORDER — ADULT MULTIVITAMIN W/MINERALS CH
1.0000 | ORAL_TABLET | Freq: Every day | ORAL | Status: DC
  Administered 2015-08-09: 1 via ORAL
  Filled 2015-08-08: qty 1

## 2015-08-08 MED ORDER — MONTELUKAST SODIUM 10 MG PO TABS
10.0000 mg | ORAL_TABLET | Freq: Every day | ORAL | Status: DC
  Administered 2015-08-08: 10 mg via ORAL
  Filled 2015-08-08: qty 1

## 2015-08-08 MED ORDER — FLUTICASONE PROPIONATE 50 MCG/ACT NA SUSP
2.0000 | Freq: Every day | NASAL | Status: DC | PRN
Start: 2015-08-08 — End: 2015-08-09
  Filled 2015-08-08: qty 16

## 2015-08-08 MED ORDER — METRONIDAZOLE IN NACL 5-0.79 MG/ML-% IV SOLN
500.0000 mg | Freq: Three times a day (TID) | INTRAVENOUS | Status: DC
  Administered 2015-08-09: 500 mg via INTRAVENOUS
  Filled 2015-08-08 (×4): qty 100

## 2015-08-08 MED ORDER — LORATADINE 10 MG PO TABS
10.0000 mg | ORAL_TABLET | Freq: Every day | ORAL | Status: DC
  Administered 2015-08-09: 10 mg via ORAL
  Filled 2015-08-08: qty 1

## 2015-08-08 MED ORDER — THIAMINE HCL 100 MG/ML IJ SOLN: 100.0000 mg | Freq: Every day | INTRAMUSCULAR | Status: DC

## 2015-08-08 MED ORDER — LORAZEPAM 1 MG PO TABS
0.0000 mg | ORAL_TABLET | Freq: Four times a day (QID) | ORAL | Status: DC
  Administered 2015-08-08: 1 mg via ORAL
  Administered 2015-08-09: 2 mg via ORAL
  Administered 2015-08-09: 4 mg via ORAL
  Filled 2015-08-08: qty 2
  Filled 2015-08-08: qty 4
  Filled 2015-08-08: qty 1

## 2015-08-08 MED ORDER — SODIUM CHLORIDE 0.9 % IV BOLUS (SEPSIS)
1000.0000 mL | Freq: Once | INTRAVENOUS | Status: AC
  Administered 2015-08-08: 1000 mL via INTRAVENOUS

## 2015-08-08 MED ORDER — BRIMONIDINE TARTRATE-TIMOLOL 0.2-0.5 % OP SOLN: 1.0000 [drp] | Freq: Two times a day (BID) | OPHTHALMIC | Status: DC

## 2015-08-08 MED ORDER — CIPROFLOXACIN IN D5W 400 MG/200ML IV SOLN
400.0000 mg | Freq: Once | INTRAVENOUS | Status: DC
  Filled 2015-08-08: qty 200

## 2015-08-08 MED ORDER — LORAZEPAM 2 MG/ML IJ SOLN: 1.0000 mg | Freq: Four times a day (QID) | INTRAMUSCULAR | Status: DC | PRN

## 2015-08-08 MED ORDER — LORAZEPAM 1 MG PO TABS: 0.0000 mg | ORAL_TABLET | Freq: Two times a day (BID) | ORAL | Status: DC

## 2015-08-08 MED ORDER — HYDROCORTISONE ACETATE 25 MG RE SUPP: 25.0000 mg | Freq: Two times a day (BID) | RECTAL | Status: DC | PRN

## 2015-08-08 MED ORDER — PANTOPRAZOLE SODIUM 40 MG PO TBEC: 40.0000 mg | DELAYED_RELEASE_TABLET | Freq: Every day | ORAL | Status: DC

## 2015-08-08 MED ORDER — METRONIDAZOLE IN NACL 5-0.79 MG/ML-% IV SOLN
500.0000 mg | Freq: Once | INTRAVENOUS | Status: AC
  Administered 2015-08-08: 500 mg via INTRAVENOUS
  Filled 2015-08-08: qty 100

## 2015-08-08 MED ORDER — LORAZEPAM 1 MG PO TABS: 1.0000 mg | ORAL_TABLET | Freq: Four times a day (QID) | ORAL | Status: DC | PRN

## 2015-08-08 MED ORDER — METOPROLOL SUCCINATE ER 25 MG PO TB24
25.0000 mg | ORAL_TABLET | Freq: Every day | ORAL | Status: DC
  Administered 2015-08-09: 25 mg via ORAL
  Filled 2015-08-08: qty 1

## 2015-08-08 MED ORDER — SODIUM CHLORIDE 0.9 % IV SOLN
INTRAVENOUS | Status: DC
  Administered 2015-08-08 – 2015-08-09 (×2): via INTRAVENOUS

## 2015-08-08 MED ORDER — IRBESARTAN 75 MG PO TABS
75.0000 mg | ORAL_TABLET | Freq: Every day | ORAL | Status: DC
  Administered 2015-08-09: 75 mg via ORAL
  Filled 2015-08-08: qty 1

## 2015-08-08 MED ORDER — ACETAMINOPHEN 650 MG RE SUPP: 650.0000 mg | Freq: Four times a day (QID) | RECTAL | Status: DC | PRN

## 2015-08-08 MED ORDER — VITAMIN D 1000 UNITS PO TABS
1000.0000 [IU] | ORAL_TABLET | Freq: Every day | ORAL | Status: DC
  Administered 2015-08-09: 1000 [IU] via ORAL
  Filled 2015-08-08 (×2): qty 1

## 2015-08-08 MED ORDER — BRIMONIDINE TARTRATE 0.2 % OP SOLN
1.0000 [drp] | Freq: Two times a day (BID) | OPHTHALMIC | Status: DC
  Administered 2015-08-08 – 2015-08-09 (×2): 1 [drp] via OPHTHALMIC
  Filled 2015-08-08: qty 5

## 2015-08-08 MED ORDER — TIMOLOL MALEATE 0.5 % OP SOLN
1.0000 [drp] | Freq: Two times a day (BID) | OPHTHALMIC | Status: DC
  Administered 2015-08-08 – 2015-08-09 (×2): 1 [drp] via OPHTHALMIC
  Filled 2015-08-08: qty 5

## 2015-08-08 MED ORDER — ONDANSETRON HCL 4 MG/2ML IJ SOLN: 4.0000 mg | Freq: Four times a day (QID) | INTRAMUSCULAR | Status: DC | PRN

## 2015-08-08 MED ORDER — ACETAMINOPHEN 325 MG PO TABS: 650.0000 mg | ORAL_TABLET | Freq: Four times a day (QID) | ORAL | Status: DC | PRN

## 2015-08-08 NOTE — ED Notes (Signed)
Pt c/o abd pain.

## 2015-08-08 NOTE — ED Notes (Signed)
Pt up to bedside commode

## 2015-08-08 NOTE — ED Notes (Signed)
Attempted report, left callback number of [phone], sandra is receiving nurse on floor.

## 2015-08-08 NOTE — ED Notes (Signed)
The pt just keeps having bms  Sl formed and sometimes diarrhea.  accassional blood flecks in the stool  His sitter reports that he usually has frequent bms at home

## 2015-08-08 NOTE — H&P (Signed)
History and Physical:    Kenneth Luna.   [id number] DOB: 21-May-1932 DOA: 08/08/2015  Referring MD/provider: Brantley Stage, MD PCP: Dion Body, MD  Outpatient Specialists: Dr. Lucilla Lame, Gastroenterology Patient coming from:   Chief Complaint: Weakness and fatigue x 24 hours, diarrhea, left lower quadrant abdominal pain.  History of Present Illness:   Kenneth Luna. is an 82 y.o. male with a PMH of hypertension, hyperlipidemia, mild dementia, recently diagnosed rectal adenocarcinoma (with referral to colorectal surgeon for resection planned), recent hospitalization in early April 2017 for a basal ganglia intraparenchymal hemorrhage secondary to hypertension versus severe fall, treated at [facility], and discharged in reported good condition. States that he initially was doing well, but over the past 1-2 days has had had generalized weakness and fatigue. He was told by his visiting nurse today that his blood pressures were low. His caregiver also states that he seems unsteady today while using the restroom. States that he had a bowel movement of loose stool. Subsequently upon standing up states that he felt very lightheaded as if he was about to pass out. His caregiver tried to help him as he stumbled to the sink to wash his hands. She subsequently sat him down as he looked like if he was about to pass out. He seemed pale and sweaty to her. She called EMS on their arrival his systolic blood pressure was in the 80s. The patient reports some left lower quadrant abdominal pain that began in the past 24 hours. Dull in quality. No aggravating or alleviating factors.  ED Course: Patient was hemodynamically stable on admission. Labs revealed a WBC of 8.8, hemoglobin 12.3. Chemistries were unremarkable. CT of the abdomen showed severe sigmoid diverticulosis with mild acute diverticulitis. Patient was started on Cipro/Flagyl and TRH called to admit.  ROS:   Review of Systems    Constitutional: Positive for malaise/fatigue and diaphoresis. Negative for fever, chills and weight loss.  HENT: Negative.   Eyes: Negative.   Respiratory: Negative for shortness of breath.   Cardiovascular: Negative for chest pain, palpitations and leg swelling.  Gastrointestinal: Positive for abdominal pain, diarrhea and blood in stool. Negative for nausea and vomiting.  Genitourinary: Negative for dysuria.  Musculoskeletal: Positive for falls. Negative for myalgias and joint pain.  Skin: Negative.   Neurological: Positive for weakness. Negative for dizziness.  Endo/Heme/Allergies: Bruises/bleeds easily.  Psychiatric/Behavioral: Positive for substance abuse.       Drinks approximately 4 glasses of wine daily, denies a history of withdrawal     Past Medical History:   Past Medical History  Diagnosis Date  . Hypertension   . Glaucoma   . Colon polyp   . Allergic rhinitis   . Substance abuse     alcohol  . Hyperlipidemia   . Anemia   . Dementia     memory issues  . GERD (gastroesophageal reflux disease)   . Fatty liver   . B12 deficiency 06/29/2015  . Benign neoplasm of ascending colon   . Benign neoplasm of descending colon   . DD (diverticular disease) 06/29/2015  . Rectal adenocarcinoma ([place]) 07/15/2015  . Traumatic intraparenchymal hemorrhage [facility])     Past Surgical History:   Past Surgical History  Procedure Laterality Date  . Colonoscopy with propofol N/A 07/10/2015    Procedure: COLONOSCOPY WITH PROPOFOL;  Surgeon: Lucilla Lame, MD;  Location: [place];  Service: Endoscopy;  Laterality: N/A;  PER CAREGIVER WAS TOLD THAT PT WOULD BE 1ST (KEEP  PT EARLY AM)  . Polypectomy  07/10/2015    Procedure: POLYPECTOMY;  Surgeon: Lucilla Lame, MD;  Location: [place];  Service: Endoscopy;;  . Cataract extraction w/ intraocular lens implant      Social History:   Social History   Social History  . Marital Status: Married    Spouse Name: N/A  . Number  of Children: N/A  . Years of Education: N/A   Occupational History  . Not on file.   Social History Main Topics  . Smoking status: Former Smoker    Types: Pipe    Quit date: 04/18/1978  . Smokeless tobacco: Never Used  . Alcohol Use: 16.8 oz/week    28 Glasses of wine per week     Comment: 4 glass of wine daily  . Drug Use: No  . Sexual Activity: Not on file   Other Topics Concern  . Not on file   Social History Narrative    Allergies   Review of patient's allergies indicates no known allergies.  Family history:   Family History  Problem Relation Age of Onset  . Cancer Father     Prostate    Current Medications:   Prior to Admission medications   Medication Sig Start Date End Date Taking? Authorizing Provider  brimonidine-timolol (COMBIGAN) 0.2-0.5 % ophthalmic solution Place 1 drop into both eyes 2 (two) times daily.   Yes Historical Provider, MD  cetirizine (ZYRTEC) 10 MG tablet Take 10 mg by mouth daily.    Yes Historical Provider, MD  cholecalciferol (VITAMIN D) 1000 units tablet Take 1,000 Units by mouth daily.   Yes Historical Provider, MD  donepezil (ARICEPT) 10 MG tablet Take 10 mg by mouth at bedtime.   Yes Historical Provider, MD  fluticasone (FLONASE) 50 MCG/ACT nasal spray Place 2 sprays into both nostrils daily as needed for rhinitis.    Yes Historical Provider, MD  hydrocortisone (ANUSOL-HC) 25 MG suppository Place 1 suppository (25 mg total) rectally 2 (two) times daily. Patient taking differently: Place 25 mg rectally 2 (two) times daily as needed.  06/24/15  Yes Loletha Grayer, MD  metoprolol succinate (TOPROL XL) 25 MG 24 hr tablet Take 1 tablet (25 mg total) by mouth daily. 06/24/15  Yes Richard Leslye Peer, MD  montelukast (SINGULAIR) 10 MG tablet Take 10 mg by mouth at bedtime.   Yes Historical Provider, MD  mupirocin ointment (BACTROBAN) 2 % Apply 1 application topically daily as needed.  07/01/15  Yes Historical Provider, MD  omeprazole (PRILOSEC) 20 MG  capsule Take 1 capsule (20 mg total) by mouth daily. 06/24/15  Yes Richard Leslye Peer, MD  valsartan (DIOVAN) 80 MG tablet Take 80 mg by mouth daily.   Yes Historical Provider, MD  vitamin B-12 (CYANOCOBALAMIN) 1000 MCG tablet Take 1,000 mcg by mouth daily.   Yes Historical Provider, MD    Physical Exam:   Filed Vitals:   08/08/15 1445 08/08/15 1500 08/08/15 1515 08/08/15 1700  BP: 145/61 133/72 141/74 167/77  Pulse: 57 55 71   Temp:      TempSrc:      Resp: 14 13 28 16   Height:      Weight:      SpO2: 99% 100% 100% 98%     Physical Exam: Blood pressure 167/77, pulse 71, temperature 98.2 F (36.8 C), temperature source Oral, resp. rate 16, height 5\' 5"  (1.651 m), weight 60.328 kg (133 lb), SpO2 98 %. Gen: No acute distress. Head: Normocephalic, atraumatic. Eyes: PERRL, EOMI, sclerae nonicteric.  Mouth: Oropharynx clear with good dentition. Neck: Supple, no thyromegaly, no lymphadenopathy, no jugular venous distention. Chest: Lungs clear to auscultation bilaterally. CV: Heart sounds are regular. No murmurs, rubs, or gallops. Abdomen: Soft, nontender, nondistended with normal active bowel sounds. Extremities: Extremities are without clubbing, edema, or cyanosis. Skin tears to the left arm. Skin: Warm and dry.Facial flushing and telangiectasias noted. Neuro: Alert and oriented times 3; grossly nonfocal.  Psych: Mood and affect normal.   Data Review:    Labs: Basic Metabolic Panel:  Recent Labs Lab 08/08/15 1317  NA 137  K 4.4  CL 104  CO2 20*  GLUCOSE 124*  BUN 8  CREATININE 0.87  CALCIUM 9.3   Liver Function Tests:  Recent Labs Lab 08/08/15 1445  AST 51*  ALT 37  ALKPHOS 54  BILITOT 0.6  PROT 6.6  ALBUMIN 2.8*    Recent Labs Lab 08/08/15 1445  LIPASE 44   CBC:  Recent Labs Lab 08/08/15 1317 08/08/15 1445  WBC 8.8  --   NEUTROABS  --  6.6  HGB 12.3*  --   HCT 38.0*  --   MCV 95.5  --   PLT 280  --     Radiographic Studies: Ct Abdomen  Pelvis W Contrast  08/08/2015  CLINICAL DATA:  LEFT lower quadrant pain.  Near syncope. EXAM: CT ABDOMEN AND PELVIS WITH CONTRAST TECHNIQUE: Multidetector CT imaging of the abdomen and pelvis was performed using the standard protocol following bolus administration of intravenous contrast. CONTRAST:  1 ISOVUE-300 IOPAMIDOL (ISOVUE-300) INJECTION 61% COMPARISON:  None. FINDINGS: Lower chest: Lung bases are clear. Hepatobiliary: No focal hepatic lesion. No biliary duct dilatation. Gallbladder is normal. Common bile duct is normal. Pancreas: Pancreas is normal. No ductal dilatation. No pancreatic inflammation. Spleen: Normal spleen Adrenals/urinary tract: Adrenal glands and kidneys are normal. The ureters and bladder normal. Stomach/Bowel: Small hiatal hernia. Duodenum small bowel and cecum normal. Appendix is normal. There are multiple diverticula of the ascending colon. Multiple diverticula of the descending colon sigmoid colon. Very mild pericolonic stranding along the most proximal sigmoid colon. Open (coronal image 52). Vascular/Lymphatic: Abdominal aorta is normal caliber with atherosclerotic calcification. There is no retroperitoneal or periportal lymphadenopathy. No pelvic lymphadenopathy. Reproductive: Prostate normal. Other: No free fluid. Musculoskeletal: Degenerate spurring of spine. Compression deformity at T12 appears chronic. IMPRESSION: 1. Severe sigmoid diverticulosis. Subtle stranding around the most distal descending colon suggest very mild acute diverticulitis. 2.  Atherosclerotic calcification of the abdominal aorta. Electronically Signed   By: Suzy Bouchard M.D.   On: 08/08/2015 16:12    EKG: Independently reviewed. Normal sinus rhythm at 55 bpm. RVH. No significant change when compared to prior.   Assessment/Plan:   Principal Problem:   Acute diverticulitis Appears to be mild on imaging. No leukocytosis or fevers. Start Cipro/Flagyl. Can possibly transition to by mouth therapy and  discharge tomorrow if weakness improved.  Active Problems:   GI bleed Hemoglobin stable. Appears to be related to his known history of rectal adenocarcinoma. Check an H&H in the morning.    Dementia Continue Aricept.    Essential (primary) hypertension Continue metoprolol and Diovan.    Rectal adenocarcinoma [facility]) Outpatient follow-up for surgical removal.    Generalized weakness Physical therapy evaluation requested.    Alcohol dependence ([place]) Admits to 4 glasses of wine daily. No history of alcohol withdrawal. We'll place on CIWA detox protocol and supplement thiamine and folic acid.   Other information:   DVT prophylaxis: SCDs ordered. Code Status: Full  code. Family Communication: The patient is widowed. He has a sister who is involved in his care, but is currently not present at the bedside. He has a caregiver who provides 24-hour care in his home when needed. Disposition Plan: Home with caregiver support. Consults called: None. Admission status: Observation.  Time spent: One hour.  Faelyn Sigler [facility] Pager [phone] Cell: [phone]   If 7PM-7AM, please contact night-coverage [email or web address] Password [facility] 08/08/2015, 6:05 PM

## 2015-08-08 NOTE — ED Notes (Signed)
The pt is alert no distress 

## 2015-08-08 NOTE — ED Notes (Signed)
cipro started at 262mL/hr. Scanner broken

## 2015-08-08 NOTE — ED Notes (Signed)
MD at bedside. 

## 2015-08-08 NOTE — ED Notes (Signed)
Arrives from home via EMS. PT was having BM and his caregiver reports that PT was unresponsive on the toilet. PT was moved to bed. No radial pulses, cool, clammy, first BP was 80/40.  Pt was D/C'd from [facility] recently for a head bleed. PT fell and hit his head a few weeks ago.

## 2015-08-08 NOTE — ED Provider Notes (Signed)
CSN: [id number]     Arrival date & time 08/08/15  1313 History   First MD Initiated Contact with Patient 08/08/15 1319     Chief Complaint  Patient presents with  . Loss of Consciousness     (Consider location/radiation/quality/duration/timing/severity/associated sxs/prior Treatment) HPI  82 year old male who presents with near syncopal episode. He is a history of hypertension, hyperlipidemia, mild dementia, and rectal adenocarcinoma with no plan treatment currently. He states that he was recently admitted and early April 2017 for a intraparenchymal hemorrhage secondary to severe fall that he sustained several weeks ago. Was treated at [facility], and discharged in reported good condition. States that he initially was doing well, but over the past 1-2 days has had had generalized weakness and fatigue. He was told by his visiting nurse today that he was having low blood pressures over the past day. His caregiver also states that he seems unsteady today while using the restroom. States that he had a bowel movement of loose stool. Subsequently upon standing up states that he felt very lightheaded as if he was about to pass out. He has caregiver tried to help him as he stumbled to the sink to wash his hands. She subsequently sat him down as he looked like if he was about to pass out. He seemed pale and sweaty to him. She called EMS on their arrival his systolic blood pressure was in the 80s. He denied any chest pain, palpitations, shortness of breath. He has noted some left-sided abdominal pain that started earlier today. All having a bowel movement here his caregiver also noted that he had some blood within his stools. States that with his rectal cancer this does occasionally occur. Has not had any nausea, vomiting, fever, cough, dysuria, urinary frequency.  Past Medical History  Diagnosis Date  . Hypertension   . Glaucoma   . Colon polyp   . Allergic rhinitis   . Substance abuse     alcohol  .  Hyperlipidemia   . Anemia   . Dementia     memory issues  . GERD (gastroesophageal reflux disease)   . Fatty liver   . B12 deficiency 06/29/2015  . Benign neoplasm of ascending colon   . Benign neoplasm of descending colon   . DD (diverticular disease) 06/29/2015  . Rectal adenocarcinoma ([place]) 07/15/2015   Past Surgical History  Procedure Laterality Date  . No past surgeries    . Colonoscopy with propofol N/A 07/10/2015    Procedure: COLONOSCOPY WITH PROPOFOL;  Surgeon: Lucilla Lame, MD;  Location: [place];  Service: Endoscopy;  Laterality: N/A;  PER CAREGIVER WAS TOLD THAT PT WOULD BE 1ST (KEEP PT EARLY AM)  . Polypectomy  07/10/2015    Procedure: POLYPECTOMY;  Surgeon: Lucilla Lame, MD;  Location: [place];  Service: Endoscopy;;   No family history on file. Social History  Substance Use Topics  . Smoking status: Former Smoker    Types: Pipe    Quit date: 04/18/1978  . Smokeless tobacco: Never Used  . Alcohol Use: 16.8 oz/week    28 Glasses of wine per week     Comment: 4 glass of wine daily    Review of Systems 10/14 systems reviewed and are negative other than those stated in the HPI    Allergies  Review of patient's allergies indicates no known allergies.  Home Medications   Prior to Admission medications   Medication Sig Start Date End Date Taking? Authorizing Provider  brimonidine-timolol (COMBIGAN) 0.2-0.5 %  ophthalmic solution Place 1 drop into both eyes 2 (two) times daily.   Yes Historical Provider, MD  cetirizine (ZYRTEC) 10 MG tablet Take 10 mg by mouth daily.    Yes Historical Provider, MD  cholecalciferol (VITAMIN D) 1000 units tablet Take 1,000 Units by mouth daily.   Yes Historical Provider, MD  donepezil (ARICEPT) 10 MG tablet Take 10 mg by mouth at bedtime.   Yes Historical Provider, MD  fluticasone (FLONASE) 50 MCG/ACT nasal spray Place 2 sprays into both nostrils daily as needed for rhinitis.    Yes Historical Provider, MD   hydrocortisone (ANUSOL-HC) 25 MG suppository Place 1 suppository (25 mg total) rectally 2 (two) times daily. Patient taking differently: Place 25 mg rectally 2 (two) times daily as needed.  06/24/15  Yes Loletha Grayer, MD  metoprolol succinate (TOPROL XL) 25 MG 24 hr tablet Take 1 tablet (25 mg total) by mouth daily. 06/24/15  Yes Richard Leslye Peer, MD  montelukast (SINGULAIR) 10 MG tablet Take 10 mg by mouth at bedtime.   Yes Historical Provider, MD  mupirocin ointment (BACTROBAN) 2 % Apply 1 application topically daily as needed.  07/01/15  Yes Historical Provider, MD  omeprazole (PRILOSEC) 20 MG capsule Take 1 capsule (20 mg total) by mouth daily. 06/24/15  Yes Richard Leslye Peer, MD  valsartan (DIOVAN) 80 MG tablet Take 80 mg by mouth daily.   Yes Historical Provider, MD  vitamin B-12 (CYANOCOBALAMIN) 1000 MCG tablet Take 1,000 mcg by mouth daily.   Yes Historical Provider, MD   BP 141/74 mmHg  Pulse 71  Temp(Src) 98.2 F (36.8 C) (Oral)  Resp 28  Ht 5\' 5"  (1.651 m)  Wt 133 lb (60.328 kg)  BMI 22.13 kg/m2  SpO2 100% Physical Exam  Physical Exam  Nursing note and vitals reviewed. Constitutional: Well developed, well nourished, non-toxic, and in no acute distress Head: Normocephalic and atraumatic.  Mouth/Throat: Oropharynx is clear and moist.  Neck: Normal range of motion. Neck supple.  Cardiovascular: Normal rate and regular rhythm.   Pulmonary/Chest: Effort normal and breath sounds normal.  Abdominal: Soft. There is LLQ tenderness. There is no rebound and no guarding. Streaks of blood on rectal exam.  Musculoskeletal: Normal range of motion.  Neurological: Alert, no facial droop, fluent speech, moves all extremities symmetrically Skin: Skin is warm and dry.  Psychiatric: Cooperative   ED Course  Procedures (including critical care time) Labs Review Labs Reviewed  BASIC METABOLIC PANEL - Abnormal; Notable for the following:    CO2 20 (*)    Glucose, Bld 124 (*)    All other  components within normal limits  CBC - Abnormal; Notable for the following:    RBC 3.98 (*)    Hemoglobin 12.3 (*)    HCT 38.0 (*)    All other components within normal limits  URINALYSIS, ROUTINE W REFLEX MICROSCOPIC (NOT AT [facility]) - Abnormal; Notable for the following:    Specific Gravity, Urine 1.033 (*)    Ketones, ur 15 (*)    All other components within normal limits  HEPATIC FUNCTION PANEL - Abnormal; Notable for the following:    Albumin 2.8 (*)    AST 51 (*)    All other components within normal limits  POC OCCULT BLOOD, ED - Abnormal; Notable for the following:    Fecal Occult Bld POSITIVE (*)    All other components within normal limits  DIFFERENTIAL  LIPASE, BLOOD    Imaging Review Ct Abdomen Pelvis W Contrast  08/08/2015  CLINICAL  DATA:  LEFT lower quadrant pain.  Near syncope. EXAM: CT ABDOMEN AND PELVIS WITH CONTRAST TECHNIQUE: Multidetector CT imaging of the abdomen and pelvis was performed using the standard protocol following bolus administration of intravenous contrast. CONTRAST:  1 ISOVUE-300 IOPAMIDOL (ISOVUE-300) INJECTION 61% COMPARISON:  None. FINDINGS: Lower chest: Lung bases are clear. Hepatobiliary: No focal hepatic lesion. No biliary duct dilatation. Gallbladder is normal. Common bile duct is normal. Pancreas: Pancreas is normal. No ductal dilatation. No pancreatic inflammation. Spleen: Normal spleen Adrenals/urinary tract: Adrenal glands and kidneys are normal. The ureters and bladder normal. Stomach/Bowel: Small hiatal hernia. Duodenum small bowel and cecum normal. Appendix is normal. There are multiple diverticula of the ascending colon. Multiple diverticula of the descending colon sigmoid colon. Very mild pericolonic stranding along the most proximal sigmoid colon. Open (coronal image 52). Vascular/Lymphatic: Abdominal aorta is normal caliber with atherosclerotic calcification. There is no retroperitoneal or periportal lymphadenopathy. No pelvic lymphadenopathy.  Reproductive: Prostate normal. Other: No free fluid. Musculoskeletal: Degenerate spurring of spine. Compression deformity at T12 appears chronic. IMPRESSION: 1. Severe sigmoid diverticulosis. Subtle stranding around the most distal descending colon suggest very mild acute diverticulitis. 2.  Atherosclerotic calcification of the abdominal aorta. Electronically Signed   By: Suzy Bouchard M.D.   On: 08/08/2015 16:12   I have personally reviewed and evaluated these images and lab results as part of my medical decision-making.   EKG Interpretation   Date/Time:  Saturday August 08 2015 13:31:13 EDT Ventricular Rate:  55 PR Interval:  189 QRS Duration: 95 QT Interval:  435 QTC Calculation: 416 R Axis:   26 Text Interpretation:  Sinus rhythm Low voltage, extremity leads RSR' in V1  or V2, right VCD or RVH No significant change since last tracing Confirmed  by Dyrell Tuccillo MD, Joshuwa Vecchio [phone]) on 08/08/2015 2:30:20 PM      MDM   Final diagnoses:  Near syncope  Diverticulitis of large intestine with perforation and abscess with bleeding    82 year old male who presents with near syncope and abdominal pain. On presentation is afebrile and hemodynamically stable. He appears mildly dry on exam. Has a soft and benign abdomen but some left-sided abdominal tenderness. Rectal exam was also streaks of blood. EKG without stigmata of arrhythmia or acute ischemic changes.  Blood work without major electrolyte or metabolic derangements. He does have guaiac-positive stool, but hemoglobin noted to be 12.3 and close to baseline back in March. He has had some ongoing intermittent bleeding associated with this rectal cancer. A CT abdomen pelvis performed showing evidence of sigmoid diverticulosis with signs of diverticulitis. Suspect his near syncope episode likely from diarrhea and fluid loss from diverticulitis. Started on cipro and flagyl. Discussed with Dr. Rockne Menghini, who will admit to observation for antibiotics, IVF and  syncope w/u.     Forde Dandy, MD 08/08/15 [phone]

## 2015-08-09 DIAGNOSIS — F1020 Alcohol dependence, uncomplicated: Secondary | ICD-10-CM | POA: Diagnosis not present

## 2015-08-09 DIAGNOSIS — K5733 Diverticulitis of large intestine without perforation or abscess with bleeding: Secondary | ICD-10-CM | POA: Diagnosis not present

## 2015-08-09 DIAGNOSIS — F0390 Unspecified dementia without behavioral disturbance: Secondary | ICD-10-CM | POA: Diagnosis not present

## 2015-08-09 DIAGNOSIS — K5792 Diverticulitis of intestine, part unspecified, without perforation or abscess without bleeding: Secondary | ICD-10-CM | POA: Diagnosis not present

## 2015-08-09 LAB — HEMOGLOBIN AND HEMATOCRIT, BLOOD
HEMATOCRIT: 39 % (ref 39.0–52.0)
Hemoglobin: 12.9 g/dL — ABNORMAL LOW (ref 13.0–17.0)

## 2015-08-09 MED ORDER — THIAMINE HCL 100 MG PO TABS: 100.0000 mg | ORAL_TABLET | Freq: Every day | ORAL | Status: DC

## 2015-08-09 MED ORDER — FOLIC ACID 1 MG PO TABS: 1.0000 mg | ORAL_TABLET | Freq: Every day | ORAL | Status: DC

## 2015-08-09 MED ORDER — AMOXICILLIN-POT CLAVULANATE 875-125 MG PO TABS: 1.0000 | ORAL_TABLET | Freq: Two times a day (BID) | ORAL | Status: DC

## 2015-08-09 NOTE — Discharge Summary (Signed)
Physician Discharge Summary  Kenneth Luna. [id number] DOB: 1933-04-15 DOA: 08/08/2015  PCP: Dion Body, MD  Admit date: 08/08/2015 Discharge date: 08/09/2015   Recommendations for Outpatient Follow-Up:   1. Sent home on a 9 day course of Augmentin given active alcohol use/dependence and contraindication of Flagyl.   Discharge Diagnosis:   Principal Problem:    Acute diverticulitis Active Problems:    GI bleed    Dementia    Essential (primary) hypertension    Rectal adenocarcinoma (HCC)    Generalized weakness    Alcohol dependence ([place])    Diverticulitis   Discharge disposition:  Home.    Discharge Condition: Improved.  Diet recommendation: Low sodium, heart healthy.    History of Present Illness:   Kenneth Luna. is an 82 y.o. male with a PMH of hypertension, hyperlipidemia, mild dementia, recently diagnosed rectal adenocarcinoma (with referral to colorectal surgeon for resection planned), recent hospitalization in early April 2017 for a basal ganglia intraparenchymal hemorrhage secondary to hypertension versus severe fall, treated at [facility], and discharged in reported good condition. States that he initially was doing well, but over the past 1-2 days has had had generalized weakness and fatigue.   Hospital Course by Problem:   Principal Problem:  Acute diverticulitis Appears to be mild on imaging. No leukocytosis or fevers. Initially treated with IV Cipro/Flagyl. Sent home on an additional 9 days of Augmentin given contraindication of treatment with Flagyl given alcohol dependency.  Active Problems:  GI bleed Hemoglobin stable. Appears to be related to his known history of rectal adenocarcinoma.    Dementia Continue Aricept.   Essential (primary) hypertension Continue metoprolol and Diovan.   Rectal adenocarcinoma [facility]) Outpatient follow-up scheduled for surgical removal.   Generalized weakness Physical therapy  evaluation requested. Home health PT.   Alcohol dependence ([place]) Admits to 4-6 glasses of wine daily. No history of alcohol withdrawal. Placed on CIWA detox protocol and supplemented with thiamine and folic acid.  Medical Consultants:    None.   Discharge Exam:   Filed Vitals:   08/08/15 1954 08/09/15 0443  BP: 178/78 156/50  Pulse: 92 74  Temp: 99.4 F (37.4 C) 98.3 F (36.8 C)  Resp: 19 18   Filed Vitals:   08/08/15 1700 08/08/15 1918 08/08/15 1954 08/09/15 0443  BP: 167/77 169/84 178/78 156/50  Pulse:  92 92 74  Temp:   99.4 F (37.4 C) 98.3 F (36.8 C)  TempSrc:   Oral Oral  Resp: 16 23 19 18   Height:   5\' 5"  (1.651 m)   Weight:   60.238 kg (132 lb 12.8 oz)   SpO2: 98% 97% 99% 98%    Gen:  NAD Cardiovascular:  RRR, No M/R/G Respiratory: Lungs CTAB Gastrointestinal: Abdomen soft, NT/ND with normal active bowel sounds. Extremities: No C/E/C   The results of significant diagnostics from this hospitalization (including imaging, microbiology, ancillary and laboratory) are listed below for reference.     Procedures and Diagnostic Studies:   Ct Abdomen Pelvis W Contrast  08/08/2015  CLINICAL DATA:  LEFT lower quadrant pain.  Near syncope. EXAM: CT ABDOMEN AND PELVIS WITH CONTRAST TECHNIQUE: Multidetector CT imaging of the abdomen and pelvis was performed using the standard protocol following bolus administration of intravenous contrast. CONTRAST:  1 ISOVUE-300 IOPAMIDOL (ISOVUE-300) INJECTION 61% COMPARISON:  None. FINDINGS: Lower chest: Lung bases are clear. Hepatobiliary: No focal hepatic lesion. No biliary duct dilatation. Gallbladder is normal. Common bile duct is normal. Pancreas: Pancreas is normal.  No ductal dilatation. No pancreatic inflammation. Spleen: Normal spleen Adrenals/urinary tract: Adrenal glands and kidneys are normal. The ureters and bladder normal. Stomach/Bowel: Small hiatal hernia. Duodenum small bowel and cecum normal. Appendix is normal. There  are multiple diverticula of the ascending colon. Multiple diverticula of the descending colon sigmoid colon. Very mild pericolonic stranding along the most proximal sigmoid colon. Open (coronal image 52). Vascular/Lymphatic: Abdominal aorta is normal caliber with atherosclerotic calcification. There is no retroperitoneal or periportal lymphadenopathy. No pelvic lymphadenopathy. Reproductive: Prostate normal. Other: No free fluid. Musculoskeletal: Degenerate spurring of spine. Compression deformity at T12 appears chronic. IMPRESSION: 1. Severe sigmoid diverticulosis. Subtle stranding around the most distal descending colon suggest very mild acute diverticulitis. 2.  Atherosclerotic calcification of the abdominal aorta. Electronically Signed   By: Suzy Bouchard M.D.   On: 08/08/2015 16:12     Labs:   Basic Metabolic Panel:  Recent Labs Lab 08/08/15 1317  NA 137  K 4.4  CL 104  CO2 20*  GLUCOSE 124*  BUN 8  CREATININE 0.87  CALCIUM 9.3   GFR Estimated Creatinine Clearance: 56.7 mL/min (by C-G formula based on Cr of 0.87). Liver Function Tests:  Recent Labs Lab 08/08/15 1445  AST 51*  ALT 37  ALKPHOS 54  BILITOT 0.6  PROT 6.6  ALBUMIN 2.8*    Recent Labs Lab 08/08/15 1445  LIPASE 44   CBC:  Recent Labs Lab 08/08/15 1317 08/08/15 1445 08/09/15 0859  WBC 8.8  --   --   NEUTROABS  --  6.6  --   HGB 12.3*  --  12.9*  HCT 38.0*  --  39.0  MCV 95.5  --   --   PLT 280  --   --      Discharge Instructions:       Discharge Instructions    Call MD for:  extreme fatigue    Complete by:  As directed      Call MD for:  persistant nausea and vomiting    Complete by:  As directed      Call MD for:  severe uncontrolled pain    Complete by:  As directed      Call MD for:  temperature >100.4    Complete by:  As directed      Diet - low sodium heart healthy    Complete by:  As directed      Increase activity slowly    Complete by:  As directed               Medication List    TAKE these medications        amoxicillin-clavulanate 875-125 MG tablet  Commonly known as:  AUGMENTIN  Take 1 tablet by mouth 2 (two) times daily.     cetirizine 10 MG tablet  Commonly known as:  ZYRTEC  Take 10 mg by mouth daily.     cholecalciferol 1000 units tablet  Commonly known as:  VITAMIN D  Take 1,000 Units by mouth daily.     COMBIGAN 0.2-0.5 % ophthalmic solution  Generic drug:  brimonidine-timolol  Place 1 drop into both eyes 2 (two) times daily.     donepezil 10 MG tablet  Commonly known as:  ARICEPT  Take 10 mg by mouth at bedtime.     fluticasone 50 MCG/ACT nasal spray  Commonly known as:  FLONASE  Place 2 sprays into both nostrils daily as needed for rhinitis.     folic acid 1 MG  tablet  Commonly known as:  FOLVITE  Take 1 tablet (1 mg total) by mouth daily.     hydrocortisone 25 MG suppository  Commonly known as:  ANUSOL-HC  Place 1 suppository (25 mg total) rectally 2 (two) times daily.     metoprolol succinate 25 MG 24 hr tablet  Commonly known as:  TOPROL XL  Take 1 tablet (25 mg total) by mouth daily.     montelukast 10 MG tablet  Commonly known as:  SINGULAIR  Take 10 mg by mouth at bedtime.     mupirocin ointment 2 %  Commonly known as:  BACTROBAN  Apply 1 application topically daily as needed.     omeprazole 20 MG capsule  Commonly known as:  PRILOSEC  Take 1 capsule (20 mg total) by mouth daily.     thiamine 100 MG tablet  Take 1 tablet (100 mg total) by mouth daily.     valsartan 80 MG tablet  Commonly known as:  DIOVAN  Take 80 mg by mouth daily.     vitamin B-12 1000 MCG tablet  Commonly known as:  CYANOCOBALAMIN  Take 1,000 mcg by mouth daily.       Follow-up Information    Follow up with Dion Body, MD. Schedule an appointment as soon as possible for a visit in 1 week.   Specialty:  Family Medicine   Why:  Hospital follow up.   Contact information:   [place] [address] [phone]        Time coordinating discharge: 35 minutes.  Signed:  RAMA,CHRISTINA  Pager [phone] [facility] 08/09/2015, 1:57 PM

## 2015-08-09 NOTE — Evaluation (Signed)
Physical Therapy Evaluation Patient Details Name: Kenneth Luna. MRN: [id number] DOB: 1932-09-28 Today's Date: 08/09/2015   History of Present Illness    82 y.o. male with a PMH of hypertension, hyperlipidemia, mild dementia, recently diagnosed rectal adenocarcinoma (with referral to colorectal surgeon for resection planned), recent hospitalization in early April 2017 for a basal ganglia intraparenchymal hemorrhage secondary to hypertension versus severe fall, treated at [facility], and discharged in reported good condition. States that he initially was doing well, but over the past 1-2 days has had had generalized weakness and fatigue. He was told by his visiting nurse today that his blood pressures were low. His caregiver also states that he seems unsteady today while using the restroom. States that he had a bowel movement of loose stool. Subsequently upon standing up states that he felt very lightheaded as if he was about to pass out. His caregiver tried to help him as he stumbled to the sink to wash his hands. She subsequently sat him down as he looked like if he was about to pass out. He seemed pale and sweaty to her. She called EMS on their arrival his systolic blood pressure was in the 80s. The patient reports some left lower quadrant abdominal pain that began in the past 24 hours. Dull in quality. No aggravating or alleviating factors.   Clinical Impression  Pt presents with mild to moderate limitations to functional mobility related to generalized weakness, balance dysfunction, affecting independence with gait.  Recommend HHPT to address balance and strength with goal of grocery shopping, going to church and [facility].  Pt motivated, has home assistance and is agreeable with recommendations.  No further acute PT needs, recommend up with nursing supervision for remainder of acute stay.  Please reconsult if d/c is delayed and/or status declines.      Follow Up  Recommendations Home health PT    Equipment Recommendations  None recommended by PT    Recommendations for Other Services       Precautions / Restrictions Precautions Precautions: Fall Precaution Comments: chair alarm, up with supervision      Mobility  Bed Mobility Overal bed mobility:  (not observed, up in/back to chair)                Transfers Overall transfer level: Needs assistance Equipment used: None Transfers: Sit to/from Stand Sit to Stand: Supervision         General transfer comment: standby assist for safety, pt does not report or demonstrate unsteadiness with transitons, is able to find and align with surface, does need to use hands to stand  Ambulation/Gait Ambulation/Gait assistance: Min guard Ambulation Distance (Feet): 100 Feet Assistive device: None Gait Pattern/deviations: Step-through pattern;Decreased dorsiflexion - left;Decreased dorsiflexion - right;Staggering left;Staggering right   Gait velocity interpretation: Below normal speed for age/gender General Gait Details: occasionally staggers left/right but self-corrects; more unreliable with tight spaces or difficult turns; gait subjectively slowed, pt seems cautious but does not show overt LOB, denies dizzy/lightheaded, or undue fatigue  Stairs            Wheelchair Mobility    Modified Rankin (Stroke Patients Only)       Balance Overall balance assessment: Needs assistance Sitting-balance support: No upper extremity supported;Feet supported Sitting balance-Leahy Scale: Good     Standing balance support: No upper extremity supported;During functional activity Standing balance-Leahy Scale: Good Standing balance comment: mildly unsteady gait as noted; see rhomberg results Single Leg Stance - Right  Leg: 0 Single Leg Stance - Left Leg: 0 Tandem Stance - Right Leg: 0 Tandem Stance - Left Leg: 0 Rhomberg - Eyes Opened: 30 Rhomberg - Eyes Closed: 20     Standardized Balance  Assessment Standardized Balance Assessment : Berg Balance Test Berg Balance Test Sit to Stand: Able to stand  independently using hands Standing Unsupported: Able to stand 2 minutes with supervision Sitting with Back Unsupported but Feet Supported on Floor or Stool: Able to sit safely and securely 2 minutes Stand to Sit: Controls descent by using hands Transfers: Able to transfer safely, definite need of hands Standing Unsupported with Eyes Closed: Able to stand 10 seconds with supervision Standing Ubsupported with Feet Together: Able to place feet together independently but unable to hold for 30 seconds From Standing, Reach Forward with Outstretched Arm: Reaches forward but needs supervision From Standing Position, Pick up Object from Floor: Able to pick up shoe, needs supervision From Standing Position, Turn to Look Behind Over each Shoulder: Turn sideways only but maintains balance Turn 360 Degrees: Able to turn 360 degrees safely one side only in 4 seconds or less Standing Unsupported, Alternately Place Feet on Step/Stool: Able to complete 4 steps without aid or supervision Standing Unsupported, One Foot in Front: Loses balance while stepping or standing Standing on One Leg: Tries to lift leg/unable to hold 3 seconds but remains standing independently Total Score: 33         Pertinent Vitals/Pain Pain Assessment: No/denies pain    Home Living Family/patient expects to be discharged to:: Private residence Living Arrangements: Alone Available Help at Discharge: Friend(s);Personal care attendant;Available 24 hours/day Type of Home: House Home Access: Level entry     Home Layout: One level Home Equipment: Walker - 2 wheels      Prior Function Level of Independence: Needs assistance   Gait / Transfers Assistance Needed: typically indep to mod indep, uses RW prn after last admission (hx falls, pt denies problems recently), drives, grocery shops  ADL's / Homemaking Assistance  Needed: occasional help with buttons or ties for church, caregiver does light housekeeping, brings am/pm meals        Hand Dominance        Extremity/Trunk Assessment   Upper Extremity Assessment: Generalized weakness;Overall [facility] for tasks assessed           Lower Extremity Assessment: Generalized weakness;Overall [facility] for tasks assessed      Cervical / Trunk Assessment: Normal  Communication   Communication: No difficulties  Cognition Arousal/Alertness: Awake/alert Behavior During Therapy: WFL for tasks assessed/performed Overall Cognitive Status: Within Functional Limits for tasks assessed                      General Comments      Exercises        Assessment/Plan    PT Assessment Patient needs continued PT services  PT Diagnosis Generalized weakness   PT Problem List Decreased mobility;Decreased balance;Decreased strength  PT Treatment Interventions     PT Goals (Current goals can be found in the Care Plan section) Acute Rehab PT Goals Patient Stated Goal: grocery shop, go to church, [facility] to [facility] PT Goal Formulation: All assessment and education complete, DC therapy    Frequency     Barriers to discharge        Co-evaluation               End of Session Equipment Utilized During Treatment: Gait belt Activity Tolerance:  Patient tolerated treatment well Patient left: in chair;with chair alarm set;with call bell/phone within reach Nurse Communication: Mobility status    Functional Assessment Tool Used: Merrilee Jansky Functional Limitation: Mobility: Walking and moving around Mobility: Walking and Moving Around Current Status [id number]): At least 40 percent but less than 60 percent impaired, limited or restricted Mobility: Walking and Moving Around Goal Status [phone]): At least 1 percent but less than 20 percent impaired, limited or restricted    Time: 0928-0953 PT Time Calculation (min) (ACUTE ONLY): 25 min   Charges:   PT  Evaluation $PT Eval Moderate Complexity: 1 Procedure     PT G Codes:   PT G-Codes **NOT FOR INPATIENT CLASS** Functional Assessment Tool Used: Berg Functional Limitation: Mobility: Walking and moving around Mobility: Walking and Moving Around Current Status [phone]): At least 40 percent but less than 60 percent impaired, limited or restricted Mobility: Walking and Moving Around Goal Status [phone]): At least 1 percent but less than 20 percent impaired, limited or restricted    Herbie Drape 08/09/2015, 10:00 AM

## 2015-08-09 NOTE — Discharge Instructions (Signed)
Diverticulitis °Diverticulitis is inflammation or infection of small pouches in your colon that form when you have a condition called diverticulosis. The pouches in your colon are called diverticula. Your colon, or large intestine, is where water is absorbed and stool is formed. °Complications of diverticulitis can include: °· Bleeding. °· Severe infection. °· Severe pain. °· Perforation of your colon. °· Obstruction of your colon. °CAUSES  °Diverticulitis is caused by bacteria. °Diverticulitis happens when stool becomes trapped in diverticula. This allows bacteria to grow in the diverticula, which can lead to inflammation and infection. °RISK FACTORS °People with diverticulosis are at risk for diverticulitis. Eating a diet that does not include enough fiber from fruits and vegetables may make diverticulitis more likely to develop. °SYMPTOMS  °Symptoms of diverticulitis may include: °· Abdominal pain and tenderness. The pain is normally located on the left side of the abdomen, but may occur in other areas. °· Fever and chills. °· Bloating. °· Cramping. °· Nausea. °· Vomiting. °· Constipation. °· Diarrhea. °· Blood in your stool. °DIAGNOSIS  °Your health care provider will ask you about your medical history and do a physical exam. You may need to have tests done because many medical conditions can cause the same symptoms as diverticulitis. Tests may include: °· Blood tests. °· Urine tests. °· Imaging tests of the abdomen, including X-rays and CT scans. °When your condition is under control, your health care provider may recommend that you have a colonoscopy. A colonoscopy can show how severe your diverticula are and whether something else is causing your symptoms. °TREATMENT  °Most cases of diverticulitis are mild and can be treated at home. Treatment may include: °· Taking over-the-counter pain medicines. °· Following a clear liquid diet. °· Taking antibiotic medicines by mouth for 7-10 days. °More severe cases may  be treated at a hospital. Treatment may include: °· Not eating or drinking. °· Taking prescription pain medicine. °· Receiving antibiotic medicines through an IV tube. °· Receiving fluids and nutrition through an IV tube. °· Surgery. °HOME CARE INSTRUCTIONS  °· Follow your health care provider's instructions carefully. °· Follow a full liquid diet or other diet as directed by your health care provider. After your symptoms improve, your health care provider may tell you to change your diet. He or she may recommend you eat a high-fiber diet. Fruits and vegetables are good sources of fiber. Fiber makes it easier to pass stool. °· Take fiber supplements or probiotics as directed by your health care provider. °· Only take medicines as directed by your health care provider. °· Keep all your follow-up appointments. °SEEK MEDICAL CARE IF:  °· Your pain does not improve. °· You have a hard time eating food. °· Your bowel movements do not return to normal. °SEEK IMMEDIATE MEDICAL CARE IF:  °· Your pain becomes worse. °· Your symptoms do not get better. °· Your symptoms suddenly get worse. °· You have a fever. °· You have repeated vomiting. °· You have bloody or black, tarry stools. °MAKE SURE YOU:  °· Understand these instructions. °· Will watch your condition. °· Will get help right away if you are not doing well or get worse. °  °This information is not intended to replace advice given to you by your health care provider. Make sure you discuss any questions you have with your health care provider. °  °Document Released: 01/12/2005 Document Revised: 04/09/2013 Document Reviewed: 02/27/2013 °Elsevier Interactive Patient Education ©2016 [employer] ° °

## 2015-08-09 NOTE — Progress Notes (Signed)
IV removed. AVS given to [facility], Caregiver.  Belongings packed. Transportation with Caregiver. Understanding verbalized.

## 2015-08-10 ENCOUNTER — Inpatient Hospital Stay: Payer: Medicare Other | Attending: Hematology and Oncology | Admitting: Hematology and Oncology

## 2015-08-10 VITALS — BP 182/89 | HR 94 | Temp 95.4°F | Resp 18 | Wt 133.3 lb

## 2015-08-10 DIAGNOSIS — Z79899 Other long term (current) drug therapy: Secondary | ICD-10-CM | POA: Insufficient documentation

## 2015-08-10 DIAGNOSIS — K219 Gastro-esophageal reflux disease without esophagitis: Secondary | ICD-10-CM | POA: Diagnosis not present

## 2015-08-10 DIAGNOSIS — S22080A Wedge compression fracture of T11-T12 vertebra, initial encounter for closed fracture: Secondary | ICD-10-CM

## 2015-08-10 DIAGNOSIS — Z87891 Personal history of nicotine dependence: Secondary | ICD-10-CM | POA: Insufficient documentation

## 2015-08-10 DIAGNOSIS — F0390 Unspecified dementia without behavioral disturbance: Secondary | ICD-10-CM | POA: Diagnosis not present

## 2015-08-10 DIAGNOSIS — I10 Essential (primary) hypertension: Secondary | ICD-10-CM

## 2015-08-10 DIAGNOSIS — E785 Hyperlipidemia, unspecified: Secondary | ICD-10-CM | POA: Diagnosis not present

## 2015-08-10 DIAGNOSIS — C20 Malignant neoplasm of rectum: Secondary | ICD-10-CM

## 2015-08-10 DIAGNOSIS — K5730 Diverticulosis of large intestine without perforation or abscess without bleeding: Secondary | ICD-10-CM | POA: Diagnosis not present

## 2015-08-10 DIAGNOSIS — M25512 Pain in left shoulder: Secondary | ICD-10-CM | POA: Diagnosis not present

## 2015-08-10 DIAGNOSIS — F1020 Alcohol dependence, uncomplicated: Secondary | ICD-10-CM | POA: Diagnosis not present

## 2015-08-10 DIAGNOSIS — H409 Unspecified glaucoma: Secondary | ICD-10-CM | POA: Diagnosis not present

## 2015-08-10 DIAGNOSIS — E538 Deficiency of other specified B group vitamins: Secondary | ICD-10-CM

## 2015-08-10 DIAGNOSIS — M4854XA Collapsed vertebra, not elsewhere classified, thoracic region, initial encounter for fracture: Secondary | ICD-10-CM | POA: Insufficient documentation

## 2015-08-10 DIAGNOSIS — K921 Melena: Secondary | ICD-10-CM | POA: Insufficient documentation

## 2015-08-10 DIAGNOSIS — M549 Dorsalgia, unspecified: Secondary | ICD-10-CM | POA: Diagnosis not present

## 2015-08-10 NOTE — Progress Notes (Signed)
Pt was given appt with Dr. Rudene Christians 4/28 10 am and Dr. Carlean Jews. Had already put in ref. So they made appt for me today for t 12 compresion fx found on ct from [facility] in care everywhere

## 2015-08-10 NOTE — Progress Notes (Signed)
Daughter and Caregiver present at the time. States pt has multiple falls. Last fall was 2 days ago. Has 24 hour care due to multiple falls. Started to have rectal bleeding in March and then colonoscopy was performed at end of March when diagnosis was made for rectal adenocarcinoma. Dr. Allen Norris performed colonoscopy. Having severe back pain due to fall. Also having discomfort in abdomen that is due to diverticulitis. Is taking antibiotic at this time for diverticulitis.

## 2015-08-10 NOTE — Progress Notes (Signed)
[facility] day:  08/10/2015  Chief Complaint: Kenneth Luna. is a 82 y.o. male with rectal cancer who is referred in consultation by Dr. Netty Starring for assessment and management.  HPI:  The patient was admitted to [facility] from 06/23/2015 - 06/24/2015 with an acute GI bleed. He described a 1 day history of dark red blood per rectum.  He received fluids.  Hemoglobin remained stable (11.7 prior to discharge) with a plan for outpatient colonoscopy on 07/10/2015.   He underwent colonoscopy on 07/10/2015 by Dr. Lucilla Lame.  Colonoscopy revealed a frond-like/villous ulcerated nonobstructing large mass in the rectum.  Mass was 4 cm in length and non-circumferential. Biopsy revealed adenocarcinoma. There was a 3 mm sessile polyp in the descending colon which was removed (no pathologic change).  There was a 4 mm sessile polyp in the ascending colon which was also removed (tubular adenoma without dysplasia or malignancy).  Diverticulosis was noted.  The patient was admitted to [facility] from 07/24/2015 - 07/30/2015 with acute right basal ganglia hemorrhage and left humeral head fracture s/p a fall on 07/21/2015. Since discharge home, he has had 24 hour home care. He is now using a walker as has had no further falls.  The patient was seen by Dr. Susa Griffins on 07/26/2015.  As the tumor was 3 cm from the anal verge (too low), he was referred to a colorectal surgeon at [facility]  for evaluation and possible transanal excision or APR.  Chest, abdomen, and pelvic CT scan on 07/27/2015 revealed a circumferential soft tissue mass within the low rectum.  There was a 2.7 cm cyst in hepatic segment 4. There was a T12 compression fracture.  CEA was 4.1 on 07/26/2015.  He was admitted to [facility] in [place] from 08/08/2015 - 08/09/2015 with acute diverticulitis.  He presented with lower abdominal pain without fever.  Abdominal and pelvic CT scan revealed severe sigmoid  diverticulosis with subtle stranding around the distal descending colon c/w mild acute diverticulitis.  He was discharged home on Augmentin rather than Flagyl given his alcohol dependency.  He is scheduled for sigmoidoscopy and ultrasound at [facility] on 08/19/2015.  Regarding his diet, he eats small portions.  He eats fish, chicken, and soft foods (potato soup, apple sauce, eggs, grits).  He drinks protein drinks.  Prior to recent events, he worked out at [employer].  He is fairly independent.  He is able to perform all of his ADLs although his caregiver "keeps an eye on him".  He has mild dementia.  He needs help with his bills and the mail.  He does not drive.   Past Medical History  Diagnosis Date  . Hypertension   . Glaucoma   . Colon polyp   . Allergic rhinitis   . Substance abuse     alcohol  . Hyperlipidemia   . Anemia   . Dementia     memory issues  . GERD (gastroesophageal reflux disease)   . Fatty liver   . B12 deficiency 06/29/2015  . Benign neoplasm of ascending colon   . Benign neoplasm of descending colon   . DD (diverticular disease) 06/29/2015  . Rectal adenocarcinoma ([place]) 07/15/2015  . Traumatic intraparenchymal hemorrhage [facility])     Past Surgical History  Procedure Laterality Date  . Colonoscopy with propofol N/A 07/10/2015    Procedure: COLONOSCOPY WITH PROPOFOL;  Surgeon: Lucilla Lame, MD;  Location: [facility];  Service: Endoscopy;  Laterality: N/A;  PER CAREGIVER WAS TOLD THAT PT WOULD BE 1ST (KEEP PT EARLY AM)  . Polypectomy  07/10/2015    Procedure: POLYPECTOMY;  Surgeon: Lucilla Lame, MD;  Location: [place];  Service: Endoscopy;;  . Cataract extraction w/ intraocular lens implant      Family History  Problem Relation Age of Onset  . Cancer Father     Prostate    Social History:  reports that he quit smoking about 37 years ago. His smoking use included Pipe. He has never used smokeless tobacco. He reports that he drinks about 16.8 oz of  alcohol per week. He reports that he does not use illicit drugs.  He drinks a bottle of wine a day.  The patient is widowed.  He has no children.  The patient is accompanied by his step daughter, Gertie Exon, and caregiver, [name], today.  Allergies: No Known Allergies  Current Medications: Current Outpatient Prescriptions  Medication Sig Dispense Refill  . amoxicillin-clavulanate (AUGMENTIN) 875-125 MG tablet Take 1 tablet by mouth 2 (two) times daily. 18 tablet 0  . brimonidine-timolol (COMBIGAN) 0.2-0.5 % ophthalmic solution Place 1 drop into both eyes 2 (two) times daily.    . cetirizine (ZYRTEC) 10 MG tablet Take 10 mg by mouth daily.     . cholecalciferol (VITAMIN D) 1000 units tablet Take 1,000 Units by mouth daily.    Marland Kitchen donepezil (ARICEPT) 10 MG tablet Take 10 mg by mouth at bedtime.    . fluticasone (FLONASE) 50 MCG/ACT nasal spray Place 2 sprays into both nostrils daily as needed for rhinitis.     . folic acid (FOLVITE) 1 MG tablet Take 1 tablet (1 mg total) by mouth daily. 30 tablet 2  . hydrocortisone (ANUSOL-HC) 25 MG suppository Place 1 suppository (25 mg total) rectally 2 (two) times daily. (Patient taking differently: Place 25 mg rectally 2 (two) times daily as needed. ) 12 suppository 0  . metoprolol succinate (TOPROL XL) 25 MG 24 hr tablet Take 1 tablet (25 mg total) by mouth daily. 30 tablet 0  . montelukast (SINGULAIR) 10 MG tablet Take 10 mg by mouth at bedtime.    . mupirocin ointment (BACTROBAN) 2 % Apply 1 application topically daily as needed.   0  . omeprazole (PRILOSEC) 20 MG capsule Take 1 capsule (20 mg total) by mouth daily. 30 capsule 0  . thiamine 100 MG tablet Take 1 tablet (100 mg total) by mouth daily. 30 tablet 2  . valsartan (DIOVAN) 80 MG tablet Take 80 mg by mouth daily.    . vitamin B-12 (CYANOCOBALAMIN) 1000 MCG tablet Take 1,000 mcg by mouth daily.     No current facility-administered medications for this visit.    Review of Systems:   GENERAL:  Feels "pretty good".  No fevers, sweats or weight loss. PERFORMANCE STATUS (ECOG):  2 HEENT:  Runny nose.  Allergies.  No visual changes, sore throat, mouth sores or tenderness. Lungs: No shortness of breath.  Cough.  No hemoptysis. Cardiac:  No chest pain, palpitations, orthopnea, or PND. GI:  2-5 soft bowel movements a day with dark blood.  No nausea, vomiting, diarrhea, constipation or hematochezia. GU:  No urgency, frequency, dysuria, or hematuria. Musculoskeletal:  Back pain secondary to T12 compression fracture.  Left shoulder sore s/p fracture.  No muscle tenderness. Extremities:  No pain or swelling. Skin:  No rashes or skin changes. Neuro:  Mild dementia.  Balance and coordination off.  No headache, numbness or weakness. Endocrine:  No diabetes,  thyroid issues, hot flashes or night sweats. Psych:  No mood changes, depression or anxiety. Pain:  Back and left shoulder pain. Review of systems:  All other systems reviewed and found to be negative.  Physical Exam: Blood pressure 182/89, pulse 94, temperature 95.4 F (35.2 C), resp. rate 18, weight 133 lb 4.3 oz (60.45 kg). GENERAL:  Well developed, well nourished, elderly gentleman sitting comfortably in the exam room in no acute distress.  He has a rolling walker at his side. MENTAL STATUS:  Alert and oriented to person, place and time. HEAD:  Wearing a cap.  Gray hair.  Normocephalic, atraumatic, face symmetric, no Cushingoid features. EYES:  Glasses.  Blue eyes.  Pupils equal round and reactive to light and accomodation.  No conjunctivitis or scleral icterus. ENT:  Oropharynx clear without lesion.  Tongue normal. Mucous membranes moist.  RESPIRATORY:  Clear to auscultation without rales, wheezes or rhonchi. CARDIOVASCULAR:  Regular rate and rhythm without murmur, rub or gallop. ABDOMEN:  Soft, non-tender, with active bowel sounds, and no hepatosplenomegaly.  No masses. RECTAL:  Posterior palpable rectal mass.  Stool is  dark red. SKIN:  No rashes, ulcers or lesions. BACK:  T12 tenderness on palpation. EXTREMITIES: No edema, no skin discoloration or tenderness.  No palpable cords. LYMPH NODES: No palpable cervical, supraclavicular, axillary or inguinal adenopathy  NEUROLOGICAL: Unremarkable. PSYCH:  Appropriate.  Admission on 08/08/2015, Discharged on 08/09/2015  Component Date Value Ref Range Status  . Sodium 08/08/2015 137  135 - 145 mmol/L Final  . Potassium 08/08/2015 4.4  3.5 - 5.1 mmol/L Final  . Chloride 08/08/2015 104  101 - 111 mmol/L Final  . CO2 08/08/2015 20* 22 - 32 mmol/L Final  . Glucose, Bld 08/08/2015 124* 65 - 99 mg/dL Final  . BUN 08/08/2015 8  6 - 20 mg/dL Final  . Creatinine, Ser 08/08/2015 0.87  0.61 - 1.24 mg/dL Final  . Calcium 08/08/2015 9.3  8.9 - 10.3 mg/dL Final  . GFR calc non Af Amer 08/08/2015 >60  >60 mL/min Final  . GFR calc Af Amer 08/08/2015 >60  >60 mL/min Final   Comment: (NOTE) The eGFR has been calculated using the CKD EPI equation. This calculation has not been validated in all clinical situations. eGFR's persistently <60 mL/min signify possible Chronic Kidney Disease.   . Anion gap 08/08/2015 13  5 - 15 Final  . WBC 08/08/2015 8.8  4.0 - 10.5 K/uL Final  . RBC 08/08/2015 3.98* 4.22 - 5.81 MIL/uL Final  . Hemoglobin 08/08/2015 12.3* 13.0 - 17.0 g/dL Final  . HCT 08/08/2015 38.0* 39.0 - 52.0 % Final  . MCV 08/08/2015 95.5  78.0 - 100.0 fL Final  . MCH 08/08/2015 30.9  26.0 - 34.0 pg Final  . MCHC 08/08/2015 32.4  30.0 - 36.0 g/dL Final  . RDW 08/08/2015 12.7  11.5 - 15.5 % Final  . Platelets 08/08/2015 280  150 - 400 K/uL Final  . Color, Urine 08/08/2015 YELLOW  YELLOW Final  . APPearance 08/08/2015 CLEAR  CLEAR Final  . Specific Gravity, Urine 08/08/2015 1.033* 1.005 - 1.030 Final  . pH 08/08/2015 5.0  5.0 - 8.0 Final  . Glucose, UA 08/08/2015 NEGATIVE  NEGATIVE mg/dL Final  . Hgb urine dipstick 08/08/2015 NEGATIVE  NEGATIVE Final  . Bilirubin Urine  08/08/2015 NEGATIVE  NEGATIVE Final  . Ketones, ur 08/08/2015 15* NEGATIVE mg/dL Final  . Protein, ur 08/08/2015 NEGATIVE  NEGATIVE mg/dL Final  . Nitrite 08/08/2015 NEGATIVE  NEGATIVE Final  .  Leukocytes, UA 08/08/2015 NEGATIVE  NEGATIVE Final   MICROSCOPIC NOT DONE ON URINES WITH NEGATIVE PROTEIN, BLOOD, LEUKOCYTES, NITRITE, OR GLUCOSE <1000 mg/dL.  Marland Kitchen Neutrophils Relative % 08/08/2015 76   Final  . Neutro Abs 08/08/2015 6.6  1.7 - 7.7 K/uL Final  . Lymphocytes Relative 08/08/2015 14   Final  . Lymphs Abs 08/08/2015 1.2  0.7 - 4.0 K/uL Final  . Monocytes Relative 08/08/2015 8   Final  . Monocytes Absolute 08/08/2015 0.7  0.1 - 1.0 K/uL Final  . Eosinophils Relative 08/08/2015 1   Final  . Eosinophils Absolute 08/08/2015 0.1  0.0 - 0.7 K/uL Final  . Basophils Relative 08/08/2015 1   Final  . Basophils Absolute 08/08/2015 0.1  0.0 - 0.1 K/uL Final  . Fecal Occult Bld 08/08/2015 POSITIVE* NEGATIVE Final  . Total Protein 08/08/2015 6.6  6.5 - 8.1 g/dL Final  . Albumin 08/08/2015 2.8* 3.5 - 5.0 g/dL Final  . AST 08/08/2015 51* 15 - 41 U/L Final  . ALT 08/08/2015 37  17 - 63 U/L Final  . Alkaline Phosphatase 08/08/2015 54  38 - 126 U/L Final  . Total Bilirubin 08/08/2015 0.6  0.3 - 1.2 mg/dL Final  . Bilirubin, Direct 08/08/2015 0.2  0.1 - 0.5 mg/dL Final  . Indirect Bilirubin 08/08/2015 0.4  0.3 - 0.9 mg/dL Final  . Lipase 08/08/2015 44  11 - 51 U/L Final  . Hemoglobin 08/09/2015 12.9* 13.0 - 17.0 g/dL Final  . HCT 08/09/2015 39.0  39.0 - 52.0 % Final    Assessment:  Kenneth Luna. is a 82 y.o. male with a low rectal adenocarcinoma.  He presented with rectal bleeding.  Colonoscopy on 07/10/2015 revealed a 4 cm frond-like/villous ulcerated non-obstructing mass in the rectum.  Biopsy revealed adenocarcinoma.  Chest, abdomen, and pelvic CT scan on 07/27/2015 revealed a circumferential soft tissue mass within the low rectum. He was a T12 compression fracture.  CEA was 4.1 on  07/26/2015.  He was admitted to [facility] from 07/24/2015 - 07/30/2015 with acute right basal ganglia hemorrhage and left humeral head fracture s/p a fall on 07/21/2015. Since discharge home, he has had 24 hour home care. He is fairly independent with a walker.    He was admitted to [facility] in [place] from 08/08/2015 - 08/09/2015 with acute diverticulitis.  He was discharged home on Augmentin rather than Flagyl given his alcohol dependency.  He drinks alcohol daily.  He has mild dementia.  Performance status is 2.  Symptomatically, he continues to have frequent dark bloody stools.  He has back pain secondary to a compression fracture.  Exam reveals a palpable rectal mass.  Stool is guaiac positive.  Plan: 1.  Discuss diagnosis, staging, and management of rectal cancer.  Discuss staging in detail.  Currently no evidence of metastatic disease on CT scans.  Discussed pelvic MRI or transrectal ultrasound to assess lymph node status.  If T1-2 disease with no suspicious lymph nodes, he may be able to undergo transanal excision.  If T3-4 disease or lymph node involvement, discuss neoadjuvant radiation and chemotherapy (5FU or Xeloda) followed by resection then additional adjuvant chemotherapy to complete 6 months of therapy.  Side effects of chemotherapy were briefly reviewed. 2.  Follow-up with [facility] for sigmoidoscopy and ultrasound on 08/19/2015. 3.  Referral to orthopedics for T12 kyphoplasty. 4.  RTC on 08/21/2015 for MD assessment and coordination of care.   Lequita Asal, MD  08/10/2015, 9:02 AM

## 2015-08-10 NOTE — Progress Notes (Signed)
  Oncology Nurse Navigator Documentation  Navigator Location: [facility]-Med Onc (08/10/15 0900) Navigator Encounter Type: Initial MedOnc (08/10/15 0900)   Abnormal Finding Date: 07/10/15 (08/10/15 0900) Confirmed Diagnosis Date: 07/14/15 (08/10/15 0900)     Patient Visit Type: MedOnc;Initial (08/10/15 0900) Treatment Phase: Pre-Tx/Tx Discussion (08/10/15 0900) Barriers/Navigation Needs: Coordination of Care;Education (08/10/15 0900)   Interventions: Coordination of Care;Education Method (08/10/15 0900)   Coordination of Care: Appts (08/10/15 0900) Education Method: Verbal (08/10/15 0900)      Acuity: Level 2 (08/10/15 0900)   Acuity Level 2: Initial guidance, education and coordination as needed;Educational needs;Ongoing guidance and education throughout treatment as needed (08/10/15 0900)     Time Spent with Patient: 60 (08/10/15 0900)  Met with mr Cecere, his caregiver Holley Raring, and his step daughter Altha Harm prior to consult. Introduced Therapist, nutritional ans provided them with my contact information for any future questions or concerns. Has EUS scheduled for 5/3 at [facility] and appt for Colorectal surgeon as well. Appt scheduled while here for orthopedics for t12  Compression fracture with pain. Using a walker for ambulation. Unsteady on feet. Reports several falls.

## 2015-08-11 ENCOUNTER — Ambulatory Visit: Payer: Medicare Other | Admitting: Internal Medicine

## 2015-08-14 ENCOUNTER — Ambulatory Visit: Payer: Medicare Other | Admitting: Dietician

## 2015-08-16 ENCOUNTER — Encounter: Payer: Self-pay | Admitting: Hematology and Oncology

## 2015-08-18 DIAGNOSIS — M545 Low back pain, unspecified: Secondary | ICD-10-CM | POA: Insufficient documentation

## 2015-08-18 DIAGNOSIS — I629 Nontraumatic intracranial hemorrhage, unspecified: Secondary | ICD-10-CM | POA: Insufficient documentation

## 2015-08-19 DIAGNOSIS — C189 Malignant neoplasm of colon, unspecified: Secondary | ICD-10-CM | POA: Insufficient documentation

## 2015-08-21 ENCOUNTER — Inpatient Hospital Stay: Payer: Medicare Other | Attending: Hematology and Oncology | Admitting: Hematology and Oncology

## 2015-08-21 ENCOUNTER — Other Ambulatory Visit: Payer: Self-pay | Admitting: Hematology and Oncology

## 2015-08-21 VITALS — BP 115/78 | HR 98 | Temp 95.3°F | Resp 18 | Ht 65.0 in | Wt 128.6 lb

## 2015-08-21 DIAGNOSIS — E538 Deficiency of other specified B group vitamins: Secondary | ICD-10-CM | POA: Diagnosis not present

## 2015-08-21 DIAGNOSIS — E785 Hyperlipidemia, unspecified: Secondary | ICD-10-CM | POA: Diagnosis not present

## 2015-08-21 DIAGNOSIS — K5790 Diverticulosis of intestine, part unspecified, without perforation or abscess without bleeding: Secondary | ICD-10-CM | POA: Diagnosis not present

## 2015-08-21 DIAGNOSIS — J309 Allergic rhinitis, unspecified: Secondary | ICD-10-CM | POA: Insufficient documentation

## 2015-08-21 DIAGNOSIS — M4854XA Collapsed vertebra, not elsewhere classified, thoracic region, initial encounter for fracture: Secondary | ICD-10-CM | POA: Insufficient documentation

## 2015-08-21 DIAGNOSIS — C20 Malignant neoplasm of rectum: Secondary | ICD-10-CM | POA: Diagnosis not present

## 2015-08-21 DIAGNOSIS — X58XXXS Exposure to other specified factors, sequela: Secondary | ICD-10-CM | POA: Insufficient documentation

## 2015-08-21 DIAGNOSIS — F1020 Alcohol dependence, uncomplicated: Secondary | ICD-10-CM | POA: Insufficient documentation

## 2015-08-21 DIAGNOSIS — M549 Dorsalgia, unspecified: Secondary | ICD-10-CM | POA: Diagnosis not present

## 2015-08-21 DIAGNOSIS — Z87891 Personal history of nicotine dependence: Secondary | ICD-10-CM | POA: Diagnosis not present

## 2015-08-21 DIAGNOSIS — F0390 Unspecified dementia without behavioral disturbance: Secondary | ICD-10-CM | POA: Insufficient documentation

## 2015-08-21 DIAGNOSIS — Z8781 Personal history of (healed) traumatic fracture: Secondary | ICD-10-CM

## 2015-08-21 DIAGNOSIS — Z79899 Other long term (current) drug therapy: Secondary | ICD-10-CM | POA: Diagnosis not present

## 2015-08-21 DIAGNOSIS — K219 Gastro-esophageal reflux disease without esophagitis: Secondary | ICD-10-CM | POA: Diagnosis not present

## 2015-08-21 DIAGNOSIS — I10 Essential (primary) hypertension: Secondary | ICD-10-CM

## 2015-08-21 DIAGNOSIS — H409 Unspecified glaucoma: Secondary | ICD-10-CM | POA: Diagnosis not present

## 2015-08-21 DIAGNOSIS — R2689 Other abnormalities of gait and mobility: Secondary | ICD-10-CM

## 2015-08-21 NOTE — Progress Notes (Signed)
[facility] day:  08/21/2015  Chief Complaint: Kenneth Luna. is a 82 y.o. male with rectal cancer who is seen after interval sigmoidoscopy and endorectal ultrasound.  HPI:  The patient was last seen in the medical oncology clinic on 08/10/2015 for initial assessment.  He had a low rectal adenocarcinoma.  Colonoscopy on 07/10/2015 revealed a 4 cm frond-like/villous ulcerated non-obstructing mass in the rectum.  He was scheduled for sigmoidoscopy and ultrasound at [facility].  Flexible sigmoidoscopy and ultrasound on 08/19/2015 by Dr. Elspeth Cho revealed a hypoechoic mass in the rectum. The mass was non-circumferential and located predominantly at the right anterior rectal wall.  Endoscopic borders were irregular and consistent with intraluminal growth.  Mass was 30 mm wide x 14 mm thick.  There was no sonographic evidence suggesting breakthrough of the muscularis propria with invasion into the perirectal fat.  No lymph nodes were seen in the perirectal region and in the left iliac region.  Stage was early T3uN0.  Patient is being seen by Dr. Kathline Magic at [facility] for surgery consultation on 08/24/2015.  Symptomatically, he has done well. He voices no new complaint.  He denies any recent rectal bleeding.   Past Medical History  Diagnosis Date  . Hypertension   . Glaucoma   . Colon polyp   . Allergic rhinitis   . Substance abuse     alcohol  . Hyperlipidemia   . Anemia   . Dementia     memory issues  . GERD (gastroesophageal reflux disease)   . Fatty liver   . B12 deficiency 06/29/2015  . Benign neoplasm of ascending colon   . Benign neoplasm of descending colon   . DD (diverticular disease) 06/29/2015  . Rectal adenocarcinoma ([place]) 07/15/2015  . Traumatic intraparenchymal hemorrhage [facility])     Past Surgical History  Procedure Laterality Date  . Colonoscopy with propofol N/A 07/10/2015    Procedure: COLONOSCOPY WITH PROPOFOL;   Surgeon: Lucilla Lame, MD;  Location: [place];  Service: Endoscopy;  Laterality: N/A;  PER CAREGIVER WAS TOLD THAT PT WOULD BE 1ST (KEEP PT EARLY AM)  . Polypectomy  07/10/2015    Procedure: POLYPECTOMY;  Surgeon: Lucilla Lame, MD;  Location: [place];  Service: Endoscopy;;  . Cataract extraction w/ intraocular lens implant      Family History  Problem Relation Age of Onset  . Cancer Father     Prostate    Social History:  reports that he quit smoking about 37 years ago. His smoking use included Pipe. He has never used smokeless tobacco. He reports that he drinks about 16.8 oz of alcohol per week. He reports that he does not use illicit drugs.  He drinks a bottle of wine a day.  The patient is widowed.  He has no children.  The patient is accompanied by his sister and sister-in-law today.  Allergies: No Known Allergies  Current Medications: Current Outpatient Prescriptions  Medication Sig Dispense Refill  . brimonidine-timolol (COMBIGAN) 0.2-0.5 % ophthalmic solution Place 1 drop into both eyes 2 (two) times daily.    . cetirizine (ZYRTEC) 10 MG tablet Take 10 mg by mouth daily.     . cholecalciferol (VITAMIN D) 1000 units tablet Take 1,000 Units by mouth daily.    Marland Kitchen donepezil (ARICEPT) 10 MG tablet Take 10 mg by mouth at bedtime.    . fluticasone (FLONASE) 50 MCG/ACT nasal spray Place 2 sprays into both nostrils daily as needed  for rhinitis.     . folic acid (FOLVITE) 1 MG tablet Take 1 tablet (1 mg total) by mouth daily. 30 tablet 2  . gabapentin (NEURONTIN) 100 MG capsule     . metoprolol succinate (TOPROL XL) 25 MG 24 hr tablet Take 1 tablet (25 mg total) by mouth daily. 30 tablet 0  . montelukast (SINGULAIR) 10 MG tablet Take 10 mg by mouth at bedtime. Reported on 08/21/2015    . mupirocin ointment (BACTROBAN) 2 % Apply 1 application topically daily as needed.   0  . omeprazole (PRILOSEC) 20 MG capsule Take 1 capsule (20 mg total) by mouth daily. 30 capsule 0  .  thiamine 100 MG tablet Take 1 tablet (100 mg total) by mouth daily. 30 tablet 2  . valsartan (DIOVAN) 80 MG tablet Take 80 mg by mouth daily.    . vitamin B-12 (CYANOCOBALAMIN) 1000 MCG tablet Take 1,000 mcg by mouth daily.    . hydrocortisone (ANUSOL-HC) 25 MG suppository Place 1 suppository (25 mg total) rectally 2 (two) times daily. (Patient not taking: Reported on 08/21/2015) 12 suppository 0   No current facility-administered medications for this visit.    Review of Systems:  GENERAL:  Feels good.  No fevers, sweats or weight loss. PERFORMANCE STATUS (ECOG):  2 HEENT:  Allergies.  No visual changes, sore throat, mouth sores or tenderness. Lungs: No shortness of breath.  Cough.  No hemoptysis. Cardiac:  No chest pain, palpitations, orthopnea, or PND. GI:  Soft bowel movements.  H/o blood in stool (none recently).  No nausea, vomiting, diarrhea, or constipation. GU:  No urgency, frequency, dysuria, or hematuria. Musculoskeletal:  Back pain secondary to T12 compression fracture.  Left shoulder sore s/p fracture.  No muscle tenderness. Extremities:  No pain or swelling. Skin:  No rashes or skin changes. Neuro:  Mild dementia.  Balance and coordination off.  No headache, numbness or weakness. Endocrine:  No diabetes, thyroid issues, hot flashes or night sweats. Psych:  No mood changes, depression or anxiety. Pain:  Back pain 8 out of 10 in mornings. Review of systems:  All other systems reviewed and found to be negative.  Physical Exam: Blood pressure 115/78, pulse 98, temperature 95.3 F (35.2 C), temperature source Tympanic, resp. rate 18, height _0  (1.651 m), weight 128 lb 10.2 oz (58.35 kg). GENERAL:  Well developed, well nourished, elderly gentleman sitting comfortably in the exam room in no acute distress.  He has a rolling walker at his side. MENTAL STATUS:  Alert and oriented to person, place and time. HEAD:  Wearing a cap.  Gray hair.  Normocephalic, atraumatic, face  symmetric, no Cushingoid features. EYES:  Glasses.  Blue eyes.  Pupils equal round and reactive to light and accomodation.  No conjunctivitis or scleral icterus. ENT:  Oropharynx clear without lesion.  Tongue normal. Mucous membranes moist.  RESPIRATORY:  Clear to auscultation without rales, wheezes or rhonchi. CARDIOVASCULAR:  Regular rate and rhythm without murmur, rub or gallop. ABDOMEN:  Soft, non-tender, with active bowel sounds, and no hepatosplenomegaly.  No masses. SKIN:  No rashes, ulcers or lesions. EXTREMITIES: No edema, no skin discoloration or tenderness.  No palpable cords. LYMPH NODES: No palpable cervical, supraclavicular, axillary or inguinal adenopathy  NEUROLOGICAL: Unremarkable. PSYCH:  Appropriate.  No visits with results within 3 Day(s) from this visit. Latest known visit with results is:  Admission on 08/08/2015, Discharged on 08/09/2015  Component Date Value Ref Range Status  . Sodium 08/08/2015 137  135 -  145 mmol/L Final  . Potassium 08/08/2015 4.4  3.5 - 5.1 mmol/L Final  . Chloride 08/08/2015 104  101 - 111 mmol/L Final  . CO2 08/08/2015 20* 22 - 32 mmol/L Final  . Glucose, Bld 08/08/2015 124* 65 - 99 mg/dL Final  . BUN 08/08/2015 8  6 - 20 mg/dL Final  . Creatinine, Ser 08/08/2015 0.87  0.61 - 1.24 mg/dL Final  . Calcium 08/08/2015 9.3  8.9 - 10.3 mg/dL Final  . GFR calc non Af Amer 08/08/2015 >60  >60 mL/min Final  . GFR calc Af Amer 08/08/2015 >60  >60 mL/min Final   Comment: (NOTE) The eGFR has been calculated using the CKD EPI equation. This calculation has not been validated in all clinical situations. eGFR's persistently <60 mL/min signify possible Chronic Kidney Disease.   . Anion gap 08/08/2015 13  5 - 15 Final  . WBC 08/08/2015 8.8  4.0 - 10.5 K/uL Final  . RBC 08/08/2015 3.98* 4.22 - 5.81 MIL/uL Final  . Hemoglobin 08/08/2015 12.3* 13.0 - 17.0 g/dL Final  . HCT 08/08/2015 38.0* 39.0 - 52.0 % Final  . MCV 08/08/2015 95.5  78.0 - 100.0 fL  Final  . MCH 08/08/2015 30.9  26.0 - 34.0 pg Final  . MCHC 08/08/2015 32.4  30.0 - 36.0 g/dL Final  . RDW 08/08/2015 12.7  11.5 - 15.5 % Final  . Platelets 08/08/2015 280  150 - 400 K/uL Final  . Color, Urine 08/08/2015 YELLOW  YELLOW Final  . APPearance 08/08/2015 CLEAR  CLEAR Final  . Specific Gravity, Urine 08/08/2015 1.033* 1.005 - 1.030 Final  . pH 08/08/2015 5.0  5.0 - 8.0 Final  . Glucose, UA 08/08/2015 NEGATIVE  NEGATIVE mg/dL Final  . Hgb urine dipstick 08/08/2015 NEGATIVE  NEGATIVE Final  . Bilirubin Urine 08/08/2015 NEGATIVE  NEGATIVE Final  . Ketones, ur 08/08/2015 15* NEGATIVE mg/dL Final  . Protein, ur 08/08/2015 NEGATIVE  NEGATIVE mg/dL Final  . Nitrite 08/08/2015 NEGATIVE  NEGATIVE Final  . Leukocytes, UA 08/08/2015 NEGATIVE  NEGATIVE Final   MICROSCOPIC NOT DONE ON URINES WITH NEGATIVE PROTEIN, BLOOD, LEUKOCYTES, NITRITE, OR GLUCOSE <1000 mg/dL.  Marland Kitchen Neutrophils Relative % 08/08/2015 76   Final  . Neutro Abs 08/08/2015 6.6  1.7 - 7.7 K/uL Final  . Lymphocytes Relative 08/08/2015 14   Final  . Lymphs Abs 08/08/2015 1.2  0.7 - 4.0 K/uL Final  . Monocytes Relative 08/08/2015 8   Final  . Monocytes Absolute 08/08/2015 0.7  0.1 - 1.0 K/uL Final  . Eosinophils Relative 08/08/2015 1   Final  . Eosinophils Absolute 08/08/2015 0.1  0.0 - 0.7 K/uL Final  . Basophils Relative 08/08/2015 1   Final  . Basophils Absolute 08/08/2015 0.1  0.0 - 0.1 K/uL Final  . Fecal Occult Bld 08/08/2015 POSITIVE* NEGATIVE Final  . Total Protein 08/08/2015 6.6  6.5 - 8.1 g/dL Final  . Albumin 08/08/2015 2.8* 3.5 - 5.0 g/dL Final  . AST 08/08/2015 51* 15 - 41 U/L Final  . ALT 08/08/2015 37  17 - 63 U/L Final  . Alkaline Phosphatase 08/08/2015 54  38 - 126 U/L Final  . Total Bilirubin 08/08/2015 0.6  0.3 - 1.2 mg/dL Final  . Bilirubin, Direct 08/08/2015 0.2  0.1 - 0.5 mg/dL Final  . Indirect Bilirubin 08/08/2015 0.4  0.3 - 0.9 mg/dL Final  . Lipase 08/08/2015 44  11 - 51 U/L Final  . Hemoglobin  08/09/2015 12.9* 13.0 - 17.0 g/dL Final  . HCT 08/09/2015 39.0  39.0 - 52.0 % Final    Assessment:  Oswald Pott. is a 82 y.o. male with a T3uN0 low rectal adenocarcinoma.  He presented with rectal bleeding.  Colonoscopy on 07/10/2015 revealed a 4 cm frond-like/villous ulcerated non-obstructing mass in the rectum.  Biopsy revealed adenocarcinoma.  Chest, abdomen, and pelvic CT scan on 07/27/2015 revealed a circumferential soft tissue mass within the low rectum. He was a T12 compression fracture.  CEA was 4.1 on 07/26/2015.  Flexible sigmoidoscopy and ultrasound on 08/19/2015 revealed a 3.0 x 1.4 cm hypoechoic non-circumferential mass was found in the rectum.  There was no sonographic evidence suggesting breakthrough of the muscularis propria with invasion into the perirectal fat.  No lymph nodes were seen in the perirectal region and in the left iliac region. Stage was early T3uN0.  He was admitted to [facility] from 07/24/2015 - 07/30/2015 with acute right basal ganglia hemorrhage and left humeral head fracture s/p a fall on 07/21/2015. Since discharge home, he has had 24 hour home care. He is fairly independent with a walker.    He was admitted to [facility] in [place] from 08/08/2015 - 08/09/2015 with acute diverticulitis.  He was discharged home on Augmentin rather than Flagyl given his alcohol dependency.  He drinks alcohol daily.  He has mild dementia.  Performance status is 2.  Symptomatically, he denies rectal bleeding since his sigmoidoscopy.  He has back pain secondary to a compression fracture.  Exam reveals a palpable rectal mass.   Plan: 1.  Review findings of sigmoidoscopy and ultrasound.  Discuss neoadjuvant chemotherapy (Xeloda BID) with radiation followed by resection and additional chemotherapy to complete 6 months of therapy.  Side effects of chemotherapy reviewed included nausea, vomiting, diarrhea, mouth sores, hand foot syndrome, and myelosuppression. 2.  Preauth Xeloda  825 mg/m2 po BID during radiation (Monday-Friday). 3.  Schedule chemotherapy class. 4.  Radiation oncology consult. 5.  Encourage decreasing alcohol intake. 6.  RTC on 08/31/2015 for MD assessment, labs (CBC with diff, CMP, Mg) and intiation of Xeloda with radiation.   Lequita Asal, MD  08/21/2015

## 2015-08-21 NOTE — Progress Notes (Signed)
Pt reports rectal bleeding has stopped since the sigmoidoscopy.  Pt reports his back pain is 8/10 in the mornings.  Reports he is here for place in his colon.  No other concerns

## 2015-08-21 NOTE — Patient Instructions (Signed)
Capecitabine tablets What is this medicine? CAPECITABINE (ka pe SITE a been) is a chemotherapy drug. It slows the growth of cancer cells. This medicine is used to treat breast cancer, and also colon or rectal cancer. This medicine may be used for other purposes; ask your health care provider or pharmacist if you have questions. What should I tell my health care provider before I take this medicine? They need to know if you have any of these conditions: -bleeding or blood disorders -dihydropyrimidine dehydrogenase (DPD) deficiency -heart disease -infection (especially a virus infection such as chickenpox, cold sores, or herpes) -kidney disease -liver disease -an unusual or allergic reaction to capecitabine, 5-fluorouracil, other medicines, foods, dyes, or preservatives -pregnant or trying to get pregnant -breast-feeding How should I use this medicine? Take this medicine by mouth with a glass of water, within 30 minutes of the end of a meal. Do not cut, crush or chew this medicine. Follow the directions on the prescription label. Take your medicine at regular intervals. Do not take it more often than directed. Do not stop taking except on your doctor's advice. Your doctor may want you to take a combination of 150 mg and 500 mg tablets for each dose. It is very important that you know how to correctly take your dose. Taking the wrong tablets could result in an overdose (too much medication) or underdose (too little medication). Talk to your pediatrician regarding the use of this medicine in children. Special care may be needed. Overdosage: If you think you have taken too much of this medicine contact a poison control center or emergency room at once. NOTE: This medicine is only for you. Do not share this medicine with others. What if I miss a dose? If you miss a dose, do not take the missed dose at all. Do not take double or extra doses. Instead, continue with your next scheduled dose and check  with your doctor. What may interact with this medicine? -antacids with aluminum and/or magnesium -folic acid -leucovorin -medicines to increase blood counts like filgrastim, pegfilgrastim, sargramostim -phenytoin -vaccines -warfarin Talk to your doctor or health care professional before taking any of these medicines: -acetaminophen -aspirin -ibuprofen -ketoprofen -naproxen This list may not describe all possible interactions. Give your health care provider a list of all the medicines, herbs, non-prescription drugs, or dietary supplements you use. Also tell them if you smoke, drink alcohol, or use illegal drugs. Some items may interact with your medicine. What should I watch for while using this medicine? Visit your doctor for checks on your progress. This drug may make you feel generally unwell. This is not uncommon, as chemotherapy can affect healthy cells as well as cancer cells. Report any side effects. Continue your course of treatment even though you feel ill unless your doctor tells you to stop. In some cases, you may be given additional medicines to help with side effects. Follow all directions for their use. Call your doctor or health care professional for advice if you get a fever, chills or sore throat, or other symptoms of a cold or flu. Do not treat yourself. This drug decreases your body's ability to fight infections. Try to avoid being around people who are sick. This medicine may increase your risk to bruise or bleed. Call your doctor or health care professional if you notice any unusual bleeding. Be careful brushing and flossing your teeth or using a toothpick because you may get an infection or bleed more easily. If you have any dental   work done, tell your dentist you are receiving this medicine. Avoid taking products that contain aspirin, acetaminophen, ibuprofen, naproxen, or ketoprofen unless instructed by your doctor. These medicines may hide a fever. Do not become  pregnant while taking this medicine. Women should inform their doctor if they wish to become pregnant or think they might be pregnant. There is a potential for serious side effects to an unborn child. Talk to your health care professional or pharmacist for more information. Do not breast-feed an infant while taking this medicine. Men are advised not to father a child while taking this medicine. What side effects may I notice from receiving this medicine? Side effects that you should report to your doctor or health care professional as soon as possible: -allergic reactions like skin rash, itching or hives, swelling of the face, lips, or tongue -low blood counts - this medicine may decrease the number of white blood cells, red blood cells and platelets. You may be at increased risk for infections and bleeding. -signs of infection - fever or chills, cough, sore throat, pain or difficulty passing urine -signs of decreased platelets or bleeding - bruising, pinpoint red spots on the skin, black, tarry stools, blood in the urine -signs of decreased red blood cells - unusually weak or tired, fainting spells, lightheadedness -breathing problems -changes in vision -chest pain -dark urine -diarrhea of more than 4 bowel movements in one day or any diarrhea at night; bloody or watery diarrhea -dizziness -mouth sores -nausea and vomiting -pain, tingling, numbness in the hands or feet -redness, swelling, or sores on hands or feet -stomach pain -vomiting -yellow color of skin or eyes Side effects that usually do not require medical attention (report to your doctor or health care professional if they continue or are bothersome): -constipation -diarrhea -dry or itchy skin -hair loss -loss of appetite -nausea -weak or tired This list may not describe all possible side effects. Call your doctor for medical advice about side effects. You may report side effects to FDA at 1-800-FDA-1088. Where should I keep  my medicine? Keep out of the reach of children. Store at room temperature between 15 and 30 degrees C (59 and 86 degrees F). Keep container tightly closed. Throw away any unused medicine after the expiration date. NOTE: This sheet is a summary. It may not cover all possible information. If you have questions about this medicine, talk to your doctor, pharmacist, or health care provider.    2016, [employer] Standard. (2014-05-19 17:03:30)  

## 2015-08-26 ENCOUNTER — Other Ambulatory Visit: Payer: Self-pay | Admitting: *Deleted

## 2015-08-26 ENCOUNTER — Other Ambulatory Visit: Payer: Self-pay | Admitting: Hematology and Oncology

## 2015-08-26 DIAGNOSIS — C20 Malignant neoplasm of rectum: Secondary | ICD-10-CM

## 2015-08-26 MED ORDER — CAPECITABINE 500 MG PO TABS: ORAL_TABLET | ORAL | Status: DC

## 2015-08-26 MED ORDER — CAPECITABINE 150 MG PO TABS: ORAL_TABLET | ORAL | Status: DC

## 2015-08-26 NOTE — Patient Instructions (Signed)
Fluorouracil, 5-FU injection What is this medicine? FLUOROURACIL, 5-FU (flure oh YOOR a sil) is a chemotherapy drug. It slows the growth of cancer cells. This medicine is used to treat many types of cancer like breast cancer, colon or rectal cancer, pancreatic cancer, and stomach cancer. This medicine may be used for other purposes; ask your health care provider or pharmacist if you have questions. What should I tell my health care provider before I take this medicine? They need to know if you have any of these conditions: -blood disorders -dihydropyrimidine dehydrogenase (DPD) deficiency -infection (especially a virus infection such as chickenpox, cold sores, or herpes) -kidney disease -liver disease -malnourished, poor nutrition -recent or ongoing radiation therapy -an unusual or allergic reaction to fluorouracil, other chemotherapy, other medicines, foods, dyes, or preservatives -pregnant or trying to get pregnant -breast-feeding How should I use this medicine? This drug is given as an infusion or injection into a vein. It is administered in a hospital or clinic by a specially trained health care professional. Talk to your pediatrician regarding the use of this medicine in children. Special care may be needed. Overdosage: If you think you have taken too much of this medicine contact a poison control center or emergency room at once. NOTE: This medicine is only for you. Do not share this medicine with others. What if I miss a dose? It is important not to miss your dose. Call your doctor or health care professional if you are unable to keep an appointment. What may interact with this medicine? -allopurinol -cimetidine -dapsone -digoxin -hydroxyurea -leucovorin -levamisole -medicines for seizures like ethotoin, fosphenytoin, phenytoin -medicines to increase blood counts like filgrastim, pegfilgrastim, sargramostim -medicines that treat or prevent blood clots like warfarin,  enoxaparin, and dalteparin -methotrexate -metronidazole -pyrimethamine -some other chemotherapy drugs like busulfan, cisplatin, estramustine, vinblastine -trimethoprim -trimetrexate -vaccines Talk to your doctor or health care professional before taking any of these medicines: -acetaminophen -aspirin -ibuprofen -ketoprofen -naproxen This list may not describe all possible interactions. Give your health care provider a list of all the medicines, herbs, non-prescription drugs, or dietary supplements you use. Also tell them if you smoke, drink alcohol, or use illegal drugs. Some items may interact with your medicine. What should I watch for while using this medicine? Visit your doctor for checks on your progress. This drug may make you feel generally unwell. This is not uncommon, as chemotherapy can affect healthy cells as well as cancer cells. Report any side effects. Continue your course of treatment even though you feel ill unless your doctor tells you to stop. In some cases, you may be given additional medicines to help with side effects. Follow all directions for their use. Call your doctor or health care professional for advice if you get a fever, chills or sore throat, or other symptoms of a cold or flu. Do not treat yourself. This drug decreases your body's ability to fight infections. Try to avoid being around people who are sick. This medicine may increase your risk to bruise or bleed. Call your doctor or health care professional if you notice any unusual bleeding. Be careful brushing and flossing your teeth or using a toothpick because you may get an infection or bleed more easily. If you have any dental work done, tell your dentist you are receiving this medicine. Avoid taking products that contain aspirin, acetaminophen, ibuprofen, naproxen, or ketoprofen unless instructed by your doctor. These medicines may hide a fever. Do not become pregnant while taking this medicine. Women should    inform their doctor if they wish to become pregnant or think they might be pregnant. There is a potential for serious side effects to an unborn child. Talk to your health care professional or pharmacist for more information. Do not breast-feed an infant while taking this medicine. Men should inform their doctor if they wish to father a child. This medicine may lower sperm counts. Do not treat diarrhea with over the counter products. Contact your doctor if you have diarrhea that lasts more than 2 days or if it is severe and watery. This medicine can make you more sensitive to the sun. Keep out of the sun. If you cannot avoid being in the sun, wear protective clothing and use sunscreen. Do not use sun lamps or tanning beds/booths. What side effects may I notice from receiving this medicine? Side effects that you should report to your doctor or health care professional as soon as possible: -allergic reactions like skin rash, itching or hives, swelling of the face, lips, or tongue -low blood counts - this medicine may decrease the number of white blood cells, red blood cells and platelets. You may be at increased risk for infections and bleeding. -signs of infection - fever or chills, cough, sore throat, pain or difficulty passing urine -signs of decreased platelets or bleeding - bruising, pinpoint red spots on the skin, black, tarry stools, blood in the urine -signs of decreased red blood cells - unusually weak or tired, fainting spells, lightheadedness -breathing problems -changes in vision -chest pain -mouth sores -nausea and vomiting -pain, swelling, redness at site where injected -pain, tingling, numbness in the hands or feet -redness, swelling, or sores on hands or feet -stomach pain -unusual bleeding Side effects that usually do not require medical attention (report to your doctor or health care professional if they continue or are bothersome): -changes in finger or toe  nails -diarrhea -dry or itchy skin -hair loss -headache -loss of appetite -sensitivity of eyes to the light -stomach upset -unusually teary eyes This list may not describe all possible side effects. Call your doctor for medical advice about side effects. You may report side effects to FDA at 1-800-FDA-1088. Where should I keep my medicine? This drug is given in a hospital or clinic and will not be stored at home. NOTE: This sheet is a summary. It may not cover all possible information. If you have questions about this medicine, talk to your doctor, pharmacist, or health care provider.    2016, Elsevier/Gold Standard. (2007-08-08 13:53:16)  

## 2015-08-27 ENCOUNTER — Inpatient Hospital Stay: Payer: Medicare Other

## 2015-08-28 ENCOUNTER — Encounter: Payer: Self-pay | Admitting: Radiation Oncology

## 2015-08-28 ENCOUNTER — Ambulatory Visit
Admission: RE | Admit: 2015-08-28 | Discharge: 2015-08-28 | Disposition: A | Discharge status: Home / Self Care | Payer: Medicare Other | Source: Ambulatory Visit | Attending: Radiation Oncology | Admitting: Radiation Oncology

## 2015-08-28 ENCOUNTER — Other Ambulatory Visit: Payer: Self-pay | Admitting: *Deleted

## 2015-08-28 ENCOUNTER — Encounter: Payer: Self-pay | Admitting: Hematology and Oncology

## 2015-08-28 VITALS — BP 103/70 | HR 76 | Temp 95.1°F | Resp 20 | Wt 127.6 lb

## 2015-08-28 DIAGNOSIS — K760 Fatty (change of) liver, not elsewhere classified: Secondary | ICD-10-CM | POA: Insufficient documentation

## 2015-08-28 DIAGNOSIS — C20 Malignant neoplasm of rectum: Secondary | ICD-10-CM

## 2015-08-28 DIAGNOSIS — F1910 Other psychoactive substance abuse, uncomplicated: Secondary | ICD-10-CM | POA: Insufficient documentation

## 2015-08-28 DIAGNOSIS — Z86010 Personal history of colonic polyps: Secondary | ICD-10-CM | POA: Insufficient documentation

## 2015-08-28 DIAGNOSIS — I10 Essential (primary) hypertension: Secondary | ICD-10-CM | POA: Insufficient documentation

## 2015-08-28 DIAGNOSIS — Z87891 Personal history of nicotine dependence: Secondary | ICD-10-CM | POA: Insufficient documentation

## 2015-08-28 DIAGNOSIS — E538 Deficiency of other specified B group vitamins: Secondary | ICD-10-CM | POA: Insufficient documentation

## 2015-08-28 DIAGNOSIS — K219 Gastro-esophageal reflux disease without esophagitis: Secondary | ICD-10-CM | POA: Insufficient documentation

## 2015-08-28 DIAGNOSIS — F0390 Unspecified dementia without behavioral disturbance: Secondary | ICD-10-CM | POA: Insufficient documentation

## 2015-08-28 DIAGNOSIS — Z79899 Other long term (current) drug therapy: Secondary | ICD-10-CM | POA: Insufficient documentation

## 2015-08-28 DIAGNOSIS — K5790 Diverticulosis of intestine, part unspecified, without perforation or abscess without bleeding: Secondary | ICD-10-CM | POA: Insufficient documentation

## 2015-08-28 DIAGNOSIS — Z510 Encounter for antineoplastic radiation therapy: Secondary | ICD-10-CM | POA: Insufficient documentation

## 2015-08-28 DIAGNOSIS — D649 Anemia, unspecified: Secondary | ICD-10-CM | POA: Insufficient documentation

## 2015-08-28 DIAGNOSIS — E785 Hyperlipidemia, unspecified: Secondary | ICD-10-CM | POA: Insufficient documentation

## 2015-08-28 NOTE — Consult Note (Signed)
Except an outstanding is perfect of Radiation Oncology NEW PATIENT EVALUATION  Name: Kenneth Luna.  MRN: [id number]  Date:   08/28/2015     DOB: 06-Mar-1933   This 82 y.o. 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  REFERRING PHYSICIAN: Dion Body, MD  CHIEF COMPLAINT:  Chief Complaint  Patient presents with  . Cancer    Pt is here for initial consultation of rectal cancer.      DIAGNOSIS: The encounter diagnosis was Rectal cancer ([place]).   PREVIOUS INVESTIGATIONS:  CT scans reviewed Pathology report reviewed Endoscopic ultrasound report reviewed Clinical notes reviewed  HPI: Patient is a 82 year old male with mild dementia presented with rectal bleeding. Colonoscopy on 07/10/2015 showed a 4 cm frond-like villous ulcerated nonobstructing mass in the rectum about 3 cm from the anal verge. Initial colonoscopy biopsy was positive for adenocarcinoma. He was seen at [facility] underwent endoscopic ultrasound showingThe digital rectal exam revealed a 3 cm (diameter) rubbery-textured  rectal mass palpated 3.0 cm from the anal verge. The mass was  non-circumferential and located predominantly at the right-anterior  bowel wall. Endoscopic Finding : An ulcerated non-obstructing medium-sized mass was found in the distal  rectum (~2cm from dentate). The mass was partially circumferential  (involving one-third of the lumen circumference). The mass measured  three cm in length. Oozing was present. Endosonographic Finding : A hypoechoic mass was found in the rectum. The mass was  non-circumferential and located predominantly at the right-anterior  rectal wall. The endosonographic borders were irregular and consistent  with intraluminal growth. The mass measured 30 mm (in maximum width) by  14 mm (in maximum thickness). There was sonographic evidence suggesting  breakthrough of  the muscularis propria with invasion into the perirectal  fat (Layer 5, manifested by an irregular outer surface). There was no  sonographic evidence of invasion into the prostate. No lymph nodes were seen in the perirectal region and in the left iliac  region.  Recommendation from [facility] was preoperative chemoradiation prior to surgical resection based on the breakthrough the muscularis propria and invasion into the perirectal fat. Patient has been seen by medical oncology is now planned for concurrent preoperative chemoradiation. He is doing fairly well had a slight fall last week thought to be related to a CVA continues to have significant back pain based on lumbar disc. He is now referred to radiation oncology for consideration of treatment. Patient has not been complaining of any rectal bleeding at this time.     PLANNED TREATMENT REGIMEN: Concurrent chemoradiation prior to surgical resection for rectal cancer  PAST MEDICAL HISTORY:  has a past medical history of Hypertension; Glaucoma; Colon polyp; Allergic rhinitis; Substance abuse; Hyperlipidemia; Anemia; Dementia; GERD (gastroesophageal reflux disease); Fatty liver; B12 deficiency (06/29/2015); Benign neoplasm of ascending colon; Benign neoplasm of descending colon; DD (diverticular disease) (06/29/2015); Rectal adenocarcinoma ([place]) (07/15/2015); and Traumatic intraparenchymal hemorrhage ([place]).    PAST SURGICAL HISTORY:  Past Surgical History  Procedure Laterality Date  . Colonoscopy with propofol N/A 07/10/2015    Procedure: COLONOSCOPY WITH PROPOFOL;  Surgeon: Lucilla Lame, MD;  Location: [place];  Service: Endoscopy;  Laterality: N/A;  PER CAREGIVER WAS TOLD THAT PT WOULD BE 1ST (KEEP PT EARLY AM)  . Polypectomy  07/10/2015    Procedure: POLYPECTOMY;  Surgeon: Lucilla Lame, MD;  Location: [place];  Service: Endoscopy;;  . Cataract extraction w/  intraocular lens implant      FAMILY HISTORY: family history  includes Cancer in his father.  SOCIAL HISTORY:  reports that he quit smoking about 37 years ago. His smoking use included Pipe. He has never used smokeless tobacco. He reports that he drinks about 16.8 oz of alcohol per week. He reports that he does not use illicit drugs.  ALLERGIES: Review of patient's allergies indicates no known allergies.  MEDICATIONS:  Current Outpatient Prescriptions  Medication Sig Dispense Refill  . brimonidine-timolol (COMBIGAN) 0.2-0.5 % ophthalmic solution Place 1 drop into both eyes 2 (two) times daily.    . capecitabine (XELODA) 150 MG tablet Take two 150 mg tablets (total 300 mg) twice a day Monday-Friday during radiation 100 tablet 0  . capecitabine (XELODA) 500 MG tablet Take two 500 mg tablets (1000 mg total) twice a day on Monday - Friday during radiation 100 tablet 0  . cetirizine (ZYRTEC) 10 MG tablet Take 10 mg by mouth daily.     . cholecalciferol (VITAMIN D) 1000 units tablet Take 1,000 Units by mouth daily.    Marland Kitchen donepezil (ARICEPT) 10 MG tablet Take 10 mg by mouth at bedtime.    . fluticasone (FLONASE) 50 MCG/ACT nasal spray Place 2 sprays into both nostrils daily as needed for rhinitis.     . folic acid (FOLVITE) 1 MG tablet Take 1 tablet (1 mg total) by mouth daily. 30 tablet 2  . gabapentin (NEURONTIN) 100 MG capsule     . hydrocortisone (ANUSOL-HC) 25 MG suppository Place 1 suppository (25 mg total) rectally 2 (two) times daily. 12 suppository 0  . metoprolol succinate (TOPROL XL) 25 MG 24 hr tablet Take 1 tablet (25 mg total) by mouth daily. 30 tablet 0  . montelukast (SINGULAIR) 10 MG tablet Take 10 mg by mouth at bedtime. Reported on 08/21/2015    . mupirocin ointment (BACTROBAN) 2 % Apply 1 application topically daily as needed.   0  . omeprazole (PRILOSEC) 20 MG capsule Take 1 capsule (20 mg total) by mouth daily. 30 capsule 0  . thiamine 100 MG tablet Take 1 tablet (100 mg total) by mouth daily. 30 tablet 2  . valsartan (DIOVAN) 80 MG tablet  Take 80 mg by mouth daily.    . vitamin B-12 (CYANOCOBALAMIN) 1000 MCG tablet Take 1,000 mcg by mouth daily.     No current facility-administered medications for this encounter.    ECOG PERFORMANCE STATUS:  0 - Asymptomatic  REVIEW OF SYSTEMS: Patient is a poor historian although by clinical notes Patient denies any weight loss, fatigue, weakness, fever, chills or night sweats. Patient denies any loss of vision, blurred vision. Patient denies any ringing  of the ears or hearing loss. No irregular heartbeat. Patient denies heart murmur or history of fainting. Patient denies any chest pain or pain radiating to her upper extremities. Patient denies any shortness of breath, difficulty breathing at night, cough or hemoptysis. Patient denies any swelling in the lower legs. Patient denies any nausea vomiting, vomiting of blood, or coffee ground material in the vomitus. Patient denies any stomach pain. Patient states has had normal bowel movements no significant constipation or diarrhea. Patient denies any dysuria, hematuria or significant nocturia. Patient denies any problems walking, swelling in the joints or loss of balance. Patient denies any skin changes, loss of hair or loss of weight. Patient denies any excessive worrying or anxiety or significant depression. Patient denies any problems with insomnia. Patient denies excessive thirst, polyuria, polydipsia. Patient denies  any swollen glands, patient denies easy bruising or easy bleeding. Patient denies any recent infections, allergies or URI. Patient "s visual fields have not changed significantly in recent time.    PHYSICAL EXAM: BP 103/70 mmHg  Pulse 76  Temp(Src) 95.1 F (35.1 C)  Resp 20  Wt 127 lb 10.3 oz (57.9 kg) A well-developed elderly male in NAD. On rectal exam rectal sphincter tone is good to really not discernible mass on finger examination. No inguinal adenopathy is appreciated. Well-developed well-nourished patient in NAD. HEENT  reveals PERLA, EOMI, discs not visualized.  Oral cavity is clear. No oral mucosal lesions are identified. Neck is clear without evidence of cervical or supraclavicular adenopathy. Lungs are clear to A&P. Cardiac examination is essentially unremarkable with regular rate and rhythm without murmur rub or thrill. Abdomen is benign with no organomegaly or masses noted. Motor sensory and DTR levels are equal and symmetric in the upper and lower extremities. Cranial nerves II through XII are grossly intact. Proprioception is intact. No peripheral adenopathy or edema is identified. No motor or sensory levels are noted. Crude visual fields are within normal range.  LABORATORY DATA: Pathology report is reviewed    RADIOLOGY RESULTS: CT scan is reviewed shows no evidence to suggest pelvic lymph node adenopathy   IMPRESSION: Stage IIa (T3 N0 M0) adenocarcinoma the distal rectum for neoadjuvant chemoradiation prior to surgical resection in 82 year old male  PLAN: At this time I to go ahead with radiation therapy with concurrent chemotherapy in a neoadjuvant fashion. Patient is plan to start oral Xeloda along with radiation. Would plan on delivering large fetal pelvic to 4500 cGy boosting the area of tumor involvement another 540 cGy. Risks and benefits of treatment including increased chances of intermittent diarrhea, increased lower urinary tract symptoms including frequency and urgency of urination, fatigue, alteration of blood counts skin reaction all were described in detail to the patient. He seems to comprehend our treatment plan well. I have personally set up and ordered CT simulation for next week. We'll try to arrange to make sure he has his Xeloda to start at the time of radiation therapy treatments commencing.  I would like to take this opportunity for allowing me to participate in the care of your patient.Armstead Peaks., MD

## 2015-08-28 NOTE — Progress Notes (Signed)
Patient and caregiver given verbal and written education.   Both verbalized understanding.  All questions answered to their satisfaction.  Approximately 10 min spent on education.

## 2015-08-31 ENCOUNTER — Ambulatory Visit: Admission: RE | Admit: 2015-08-31 | Payer: Medicare Other | Source: Ambulatory Visit | Admitting: Radiation Oncology

## 2015-08-31 ENCOUNTER — Ambulatory Visit: Payer: Medicare Other | Admitting: Radiation Oncology

## 2015-08-31 ENCOUNTER — Inpatient Hospital Stay: Payer: Medicare Other

## 2015-08-31 ENCOUNTER — Inpatient Hospital Stay: Payer: Medicare Other | Admitting: Hematology and Oncology

## 2015-09-02 ENCOUNTER — Encounter: Payer: Self-pay | Admitting: Hematology and Oncology

## 2015-09-02 ENCOUNTER — Ambulatory Visit
Admission: RE | Admit: 2015-09-02 | Discharge: 2015-09-02 | Disposition: A | Discharge status: Home / Self Care | Payer: Medicare Other | Source: Ambulatory Visit | Attending: Radiation Oncology | Admitting: Radiation Oncology

## 2015-09-02 ENCOUNTER — Telehealth: Payer: Self-pay | Admitting: *Deleted

## 2015-09-02 ENCOUNTER — Other Ambulatory Visit: Payer: Self-pay | Admitting: *Deleted

## 2015-09-02 DIAGNOSIS — E538 Deficiency of other specified B group vitamins: Secondary | ICD-10-CM | POA: Diagnosis not present

## 2015-09-02 DIAGNOSIS — F0390 Unspecified dementia without behavioral disturbance: Secondary | ICD-10-CM | POA: Diagnosis not present

## 2015-09-02 DIAGNOSIS — K219 Gastro-esophageal reflux disease without esophagitis: Secondary | ICD-10-CM | POA: Diagnosis not present

## 2015-09-02 DIAGNOSIS — Z510 Encounter for antineoplastic radiation therapy: Secondary | ICD-10-CM | POA: Diagnosis not present

## 2015-09-02 DIAGNOSIS — C20 Malignant neoplasm of rectum: Secondary | ICD-10-CM | POA: Diagnosis present

## 2015-09-02 DIAGNOSIS — I10 Essential (primary) hypertension: Secondary | ICD-10-CM | POA: Diagnosis not present

## 2015-09-02 DIAGNOSIS — Z79899 Other long term (current) drug therapy: Secondary | ICD-10-CM | POA: Diagnosis not present

## 2015-09-02 DIAGNOSIS — Z86010 Personal history of colonic polyps: Secondary | ICD-10-CM | POA: Diagnosis not present

## 2015-09-02 DIAGNOSIS — K5790 Diverticulosis of intestine, part unspecified, without perforation or abscess without bleeding: Secondary | ICD-10-CM | POA: Diagnosis not present

## 2015-09-02 DIAGNOSIS — Z87891 Personal history of nicotine dependence: Secondary | ICD-10-CM | POA: Diagnosis not present

## 2015-09-02 DIAGNOSIS — E785 Hyperlipidemia, unspecified: Secondary | ICD-10-CM | POA: Diagnosis not present

## 2015-09-02 DIAGNOSIS — K760 Fatty (change of) liver, not elsewhere classified: Secondary | ICD-10-CM | POA: Diagnosis not present

## 2015-09-02 DIAGNOSIS — F1910 Other psychoactive substance abuse, uncomplicated: Secondary | ICD-10-CM | POA: Diagnosis not present

## 2015-09-02 DIAGNOSIS — D649 Anemia, unspecified: Secondary | ICD-10-CM | POA: Diagnosis not present

## 2015-09-02 NOTE — Telephone Encounter (Signed)
Called [employer] to get update on xeloda.  They now have approval for drug and today got approval for dispense drug.  They have moved this to stat to get through pharmacy to get it delivered to pt.  I told staff there that pt will need to start the drug on wed 5/24.  I called caregiver Holley Raring and le there know that [employer] got all permission and approval to dispense drug today.  I told them that he will need to start the drug on wed. 5/24.  That is the first day of radiaton and that is the first day to take drug.  I need to make pt appt to see md with labs. I sent message to sch. Staff to schedule it for tues but when I spoke to [place] she has another person that she stays with and tues will not work for her to bring him. She can do Monday and I told her 9 am and to come 8:45 for labs and she is agreeable to this.  Called Lisa in sch.. And she changed the appt as above.

## 2015-09-04 ENCOUNTER — Other Ambulatory Visit: Payer: Self-pay

## 2015-09-04 DIAGNOSIS — C20 Malignant neoplasm of rectum: Secondary | ICD-10-CM

## 2015-09-07 ENCOUNTER — Inpatient Hospital Stay (HOSPITAL_BASED_OUTPATIENT_CLINIC_OR_DEPARTMENT_OTHER): Payer: Medicare Other | Admitting: Hematology and Oncology

## 2015-09-07 ENCOUNTER — Encounter: Payer: Self-pay | Admitting: Hematology and Oncology

## 2015-09-07 ENCOUNTER — Encounter: Payer: Self-pay | Admitting: Dietician

## 2015-09-07 ENCOUNTER — Inpatient Hospital Stay: Payer: Medicare Other

## 2015-09-07 ENCOUNTER — Ambulatory Visit: Payer: Medicare Other | Admitting: Hematology and Oncology

## 2015-09-07 ENCOUNTER — Encounter: Payer: Medicare Other | Attending: Family Medicine | Admitting: Dietician

## 2015-09-07 VITALS — Ht 65.0 in | Wt 128.8 lb

## 2015-09-07 VITALS — BP 147/86 | HR 60 | Temp 97.6°F | Resp 18 | Wt 128.3 lb

## 2015-09-07 DIAGNOSIS — I10 Essential (primary) hypertension: Secondary | ICD-10-CM | POA: Diagnosis present

## 2015-09-07 DIAGNOSIS — Z87891 Personal history of nicotine dependence: Secondary | ICD-10-CM

## 2015-09-07 DIAGNOSIS — R2689 Other abnormalities of gait and mobility: Secondary | ICD-10-CM

## 2015-09-07 DIAGNOSIS — K760 Fatty (change of) liver, not elsewhere classified: Secondary | ICD-10-CM | POA: Diagnosis present

## 2015-09-07 DIAGNOSIS — F0390 Unspecified dementia without behavioral disturbance: Secondary | ICD-10-CM | POA: Diagnosis not present

## 2015-09-07 DIAGNOSIS — X58XXXS Exposure to other specified factors, sequela: Secondary | ICD-10-CM

## 2015-09-07 DIAGNOSIS — E785 Hyperlipidemia, unspecified: Secondary | ICD-10-CM

## 2015-09-07 DIAGNOSIS — C20 Malignant neoplasm of rectum: Secondary | ICD-10-CM

## 2015-09-07 DIAGNOSIS — M549 Dorsalgia, unspecified: Secondary | ICD-10-CM | POA: Diagnosis not present

## 2015-09-07 DIAGNOSIS — E538 Deficiency of other specified B group vitamins: Secondary | ICD-10-CM

## 2015-09-07 DIAGNOSIS — Z8781 Personal history of (healed) traumatic fracture: Secondary | ICD-10-CM

## 2015-09-07 DIAGNOSIS — M4854XA Collapsed vertebra, not elsewhere classified, thoracic region, initial encounter for fracture: Secondary | ICD-10-CM

## 2015-09-07 DIAGNOSIS — K219 Gastro-esophageal reflux disease without esophagitis: Secondary | ICD-10-CM

## 2015-09-07 DIAGNOSIS — Z79899 Other long term (current) drug therapy: Secondary | ICD-10-CM

## 2015-09-07 LAB — CBC WITH DIFFERENTIAL/PLATELET
Basophils Absolute: 0.1 10*3/uL (ref 0–0.1)
Basophils Relative: 2 %
Eosinophils Absolute: 0.2 10*3/uL (ref 0–0.7)
Eosinophils Relative: 4 %
HCT: 40.6 % (ref 40.0–52.0)
Hemoglobin: 14 g/dL (ref 13.0–18.0)
Lymphocytes Relative: 28 %
Lymphs Abs: 1.6 10*3/uL (ref 1.0–3.6)
MCH: 31.2 pg (ref 26.0–34.0)
MCHC: 34.5 g/dL (ref 32.0–36.0)
MCV: 90.5 fL (ref 80.0–100.0)
Monocytes Absolute: 0.9 10*3/uL (ref 0.2–1.0)
Monocytes Relative: 15 %
Neutro Abs: 3 10*3/uL (ref 1.4–6.5)
Neutrophils Relative %: 51 %
Platelets: 204 10*3/uL (ref 150–440)
RBC: 4.48 MIL/uL (ref 4.40–5.90)
RDW: 13.2 % (ref 11.5–14.5)
WBC: 5.8 10*3/uL (ref 3.8–10.6)

## 2015-09-07 LAB — COMPREHENSIVE METABOLIC PANEL
ALT: 34 U/L (ref 17–63)
AST: 44 U/L — ABNORMAL HIGH (ref 15–41)
Albumin: 3.8 g/dL (ref 3.5–5.0)
Alkaline Phosphatase: 69 U/L (ref 38–126)
Anion gap: 5 (ref 5–15)
BUN: 11 mg/dL (ref 6–20)
CO2: 30 mmol/L (ref 22–32)
Calcium: 10.4 mg/dL — ABNORMAL HIGH (ref 8.9–10.3)
Chloride: 98 mmol/L — ABNORMAL LOW (ref 101–111)
Creatinine, Ser: 0.66 mg/dL (ref 0.61–1.24)
GFR calc Af Amer: >60 mL/min (ref >60)
GFR calc non Af Amer: >60 mL/min (ref >60)
Glucose, Bld: 110 mg/dL — ABNORMAL HIGH (ref 65–99)
Potassium: 4.6 mmol/L (ref 3.5–5.1)
Sodium: 133 mmol/L — ABNORMAL LOW (ref 135–145)
Total Bilirubin: 0.9 mg/dL (ref 0.3–1.2)
Total Protein: 7.4 g/dL (ref 6.5–8.1)

## 2015-09-07 LAB — MAGNESIUM: Magnesium: 2 mg/dL (ref 1.7–2.4)

## 2015-09-07 MED ORDER — ONDANSETRON HCL 8 MG PO TABS: 8.0000 mg | ORAL_TABLET | Freq: Three times a day (TID) | ORAL | Status: DC | PRN

## 2015-09-07 NOTE — Progress Notes (Signed)
[facility] day:  09/07/2015   Chief Complaint: Kenneth Luna. is a 82 y.o. male with rectal cancer who is seen for assessment prior to initiation of neoadjuvant concurrent Xeloda and radiation.  HPI:  The patient was last seen in the medical oncology clinic on 08/21/2015.  At that time, he was seen after interval sigmoidoscopy and endorectal ultrasound.  He was noted to have a T3uN0 low rectal adenocarcinoma.  We discussed neoadjuvant chemotherapy (5FU or Xeloda) with radiation, followed by surgery then completion of 6 months of chemotherapy (5FU or Xeloda).  Side effects were reviewed.    He was seen in consultation by Dr. Baruch Gouty on 08/28/2015.  He is scheduled to begin radiation on 09/09/2015.  He will receive 4500 cGy with a boost to the area of tumor involvement of 540 cGy.  He attended chemotherapy class.  His Xeloda pills should arrive today.    He met with orthopedics regarding his back.  He will not be undergoing kyphoplasty for his T12 compression fracture.  He was prescribed Neurontin.  Symptomatically, he feels "pretty good".  He voices no new complaints.  He denies any rectal bleeding.  He is eating "prett well".  He is supplementing with Ensure and ice cream.  He is cutting back his alcohol intake.  He has decreased from 1 to 1 1/2 bottles of wine a day to 3 glasses ad day.   Past Medical History  Diagnosis Date  . Hypertension   . Glaucoma   . Colon polyp   . Allergic rhinitis   . Substance abuse     alcohol  . Hyperlipidemia   . Anemia   . Dementia     memory issues  . GERD (gastroesophageal reflux disease)   . Fatty liver   . B12 deficiency 06/29/2015  . Benign neoplasm of ascending colon   . Benign neoplasm of descending colon   . DD (diverticular disease) 06/29/2015  . Rectal adenocarcinoma ([place]) 07/15/2015  . Traumatic intraparenchymal hemorrhage [facility])     Past Surgical History  Procedure Laterality Date   . Colonoscopy with propofol N/A 07/10/2015    Procedure: COLONOSCOPY WITH PROPOFOL;  Surgeon: Lucilla Lame, MD;  Location: [place];  Service: Endoscopy;  Laterality: N/A;  PER CAREGIVER WAS TOLD THAT PT WOULD BE 1ST (KEEP PT EARLY AM)  . Polypectomy  07/10/2015    Procedure: POLYPECTOMY;  Surgeon: Lucilla Lame, MD;  Location: [place];  Service: Endoscopy;;  . Cataract extraction w/ intraocular lens implant      Family History  Problem Relation Age of Onset  . Cancer Father     Prostate    Social History:  reports that he quit smoking about 37 years ago. His smoking use included Pipe. He has never used smokeless tobacco. He reports that he drinks about 16.8 oz of alcohol per week. He reports that he does not use illicit drugs.  He drinks a bottle of wine a day.  The patient is widowed.  He has no children.  The patient is accompanied by his caregiver, [name], today.  Allergies: No Known Allergies  Current Medications: Current Outpatient Prescriptions  Medication Sig Dispense Refill  . brimonidine-timolol (COMBIGAN) 0.2-0.5 % ophthalmic solution Place 1 drop into both eyes 2 (two) times daily.    . capecitabine (XELODA) 150 MG tablet Take two 150 mg tablets (total 300 mg) twice a day Monday-Friday during radiation 100 tablet 0  . capecitabine (  XELODA) 500 MG tablet Take two 500 mg tablets (1000 mg total) twice a day on Monday - Friday during radiation 100 tablet 0  . cetirizine (ZYRTEC) 10 MG tablet Take 10 mg by mouth daily.     . cholecalciferol (VITAMIN D) 1000 units tablet Take 1,000 Units by mouth daily.    Marland Kitchen donepezil (ARICEPT) 10 MG tablet Take 10 mg by mouth at bedtime.    . fluticasone (FLONASE) 50 MCG/ACT nasal spray Place 2 sprays into both nostrils daily as needed for rhinitis.     . folic acid (FOLVITE) 1 MG tablet Take 1 tablet (1 mg total) by mouth daily. 30 tablet 2  . gabapentin (NEURONTIN) 100 MG capsule     . hydrocortisone (ANUSOL-HC) 25 MG  suppository Place 1 suppository (25 mg total) rectally 2 (two) times daily. 12 suppository 0  . metoprolol succinate (TOPROL XL) 25 MG 24 hr tablet Take 1 tablet (25 mg total) by mouth daily. 30 tablet 0  . montelukast (SINGULAIR) 10 MG tablet Take 10 mg by mouth at bedtime. Reported on 08/21/2015    . mupirocin ointment (BACTROBAN) 2 % Apply 1 application topically daily as needed.   0  . omeprazole (PRILOSEC) 20 MG capsule Take 1 capsule (20 mg total) by mouth daily. 30 capsule 0  . thiamine 100 MG tablet Take 1 tablet (100 mg total) by mouth daily. 30 tablet 2  . valsartan (DIOVAN) 80 MG tablet Take 80 mg by mouth daily.    . vitamin B-12 (CYANOCOBALAMIN) 1000 MCG tablet Take 1,000 mcg by mouth daily.     No current facility-administered medications for this visit.    Review of Systems:  GENERAL:  Feels "pretty good".  No fevers, sweats or weight loss. PERFORMANCE STATUS (ECOG):  2 HEENT:  Allergies.  No visual changes, sore throat, mouth sores or tenderness. Lungs: No shortness of breath or cough.  No hemoptysis. Cardiac:  No chest pain, palpitations, orthopnea, or PND. GI:  Soft bowel movements.  No interval bleeding.  No nausea, vomiting, diarrhea, or constipation. GU:  No urgency, frequency, dysuria, or hematuria. Musculoskeletal:  Back pain secondary to T12 compression fracture.  Left shoulder sore s/p fracture.  No muscle tenderness. Extremities:  No pain or swelling. Skin:  No rashes or skin changes. Neuro:  Mild dementia.  Balance and coordination off (chronic).  No headache, numbness or weakness. Endocrine:  No diabetes, thyroid issues, hot flashes or night sweats. Psych:  No mood changes, depression or anxiety. Pain:  Back pain, managed by medications. Review of systems:  All other systems reviewed and found to be negative.  Physical Exam: Blood pressure 147/86, pulse 60, temperature 97.6 F (36.4 C), temperature source Oral, resp. rate 18, weight 128 lb 4.9 oz (58.2  kg). GENERAL:  Well developed, well nourished, elderly gentleman sitting comfortably in the exam room in no acute distress.  He has a rolling walker at his side. MENTAL STATUS:  Alert and oriented to person, place and time. HEAD: Pearline Cables hair.  Normocephalic, atraumatic, face symmetric, no Cushingoid features. EYES:  Glasses.  Blue eyes.  Pupils equal round and reactive to light and accomodation.  No conjunctivitis or scleral icterus. ENT:  Oropharynx clear without lesion.  Tongue normal.  Mucous membranes moist.  RESPIRATORY:  Clear to auscultation without rales, wheezes or rhonchi. CARDIOVASCULAR:  Regular rate and rhythm without murmur, rub or gallop. ABDOMEN:  Soft, non-tender, with active bowel sounds, and no hepatosplenomegaly.  No masses. SKIN:  Few small  ecchymosis on arms.  No rashes, ulcers or lesions. EXTREMITIES: No edema, no skin discoloration or tenderness.  No palpable cords. LYMPH NODES: No palpable cervical, supraclavicular, axillary or inguinal adenopathy  NEUROLOGICAL: Unremarkable. PSYCH:  Appropriate.  Appointment on 09/07/2015  Component Date Value Ref Range Status  . WBC 09/07/2015 5.8  3.8 - 10.6 K/uL Final  . RBC 09/07/2015 4.48  4.40 - 5.90 MIL/uL Final  . Hemoglobin 09/07/2015 14.0  13.0 - 18.0 g/dL Final  . HCT 09/07/2015 40.6  40.0 - 52.0 % Final  . MCV 09/07/2015 90.5  80.0 - 100.0 fL Final  . MCH 09/07/2015 31.2  26.0 - 34.0 pg Final  . MCHC 09/07/2015 34.5  32.0 - 36.0 g/dL Final  . RDW 09/07/2015 13.2  11.5 - 14.5 % Final  . Platelets 09/07/2015 204  150 - 440 K/uL Final  . Neutrophils Relative % 09/07/2015 51   Final  . Neutro Abs 09/07/2015 3.0  1.4 - 6.5 K/uL Final  . Lymphocytes Relative 09/07/2015 28   Final  . Lymphs Abs 09/07/2015 1.6  1.0 - 3.6 K/uL Final  . Monocytes Relative 09/07/2015 15   Final  . Monocytes Absolute 09/07/2015 0.9  0.2 - 1.0 K/uL Final  . Eosinophils Relative 09/07/2015 4   Final  . Eosinophils Absolute 09/07/2015 0.2  0 -  0.7 K/uL Final  . Basophils Relative 09/07/2015 2   Final  . Basophils Absolute 09/07/2015 0.1  0 - 0.1 K/uL Final    Assessment:  Kenneth Luna. is a 82 y.o. male with a T3uN0 low rectal adenocarcinoma.  He presented with rectal bleeding.  Colonoscopy on 07/10/2015 revealed a 4 cm frond-like/villous ulcerated non-obstructing mass in the rectum.  Biopsy revealed adenocarcinoma.  Chest, abdomen, and pelvic CT scan on 07/27/2015 revealed a circumferential soft tissue mass within the low rectum. He was a T12 compression fracture.  CEA was 4.1 on 07/26/2015.  Flexible sigmoidoscopy and ultrasound on 08/19/2015 revealed a 3.0 x 1.4 cm hypoechoic non-circumferential mass was found in the rectum.  There was no sonographic evidence suggesting breakthrough of the muscularis propria with invasion into the perirectal fat.  No lymph nodes were seen in the perirectal region and in the left iliac region. Stage was early T3uN0.  He was admitted to [facility] from 07/24/2015 - 07/30/2015 with acute right basal ganglia hemorrhage and left humeral head fracture s/p a fall on 07/21/2015. Since discharge home, he has had 24 hour home care. He is fairly independent with a walker.    He was admitted to [facility] in [place] from 08/08/2015 - 08/09/2015 with acute diverticulitis.  He was discharged home on Augmentin rather than Flagyl given his alcohol dependency.  He drinks alcohol daily.  He has mild dementia.  Performance status is 2.  Symptomatically, he denies rectal bleeding.  He has back pain secondary to a compression fracture.  Exam is stable.   Plan: 1.  Labs today:  CBC with diff, CMP, Mg. 2.  Review chemotherapy plan.  He will take Xeloda 1300 mg BID on days of radiation (Monday-Friday) only.  Side effects were reviewed by me and my nurse, Moishe Spice.  The patient consented to treatment. 3.  Begin Xeloda 1300 mg po BID on 09/09/2015 with initiation of radiation (Wednesday). 4.  Rx: ondansetron 8  mg po q 8 hours prn nausea; Dis #20. 5.  Encourage continued decreased alcohol intake. 6.  Form signed for prior scheduled travel that the patient had to cancel  secondary to his treatment. 7.  RTC on 09/10/2015 to review Xeloda pills (should arrive today). 8.  RTC on 09/18/2015 for NP assessment and labs (CBC with diff, CMP). 9.  RTC on 09/25/2015 for MD assessment and labs (CBC with diff, CMP).   Lequita Asal, MD  09/07/2015, 9:21 AM

## 2015-09-07 NOTE — Progress Notes (Signed)
Medical Nutrition Therapy Follow-up visit:  Time with patient: 1320-1420 Visit #:2 ASSESSMENT:  Diagnosis:fatty liver, hypertension  Current weight:128.8 lbs    Height:65 in Medications: See list Medical History: B12 deficiency, dementia, ETOH dependence, rectal cancer  Progress and evaluation: Patient accompanied by his caregiver, Holley Raring, in for medical nutrition therapy follow-up. She reports that he will be starting chemotherapy and radiation for colorectal cancer this Wednesday. He will then have surgery 4-5 weeks after chemo is completed. Since his previous visit, 07/17/15, he has been hospitalized for a fall and then was diagnosed with diverticulitis. She reports he lost 9-10 lbs during this time with a lowest weight of 126 lbs. She reports he has decreased his alcohol intake to 3 (4oz) glasses of wine daily and this has helped to slightly increase his appetite. He has regained 2.8 lbs. She reports she is offering him 1-2 cans of Ensure Plus per day in between his meals. He is eating 3 meals per day, small portions and this is not a change from his initial visit. He is now taking a multi-vitamin. Priority is adequate calories to regain to previous weight of 135 lbs as well as adequate protein to improve nutritional status for upcoming treatments and surgery.  NUTRITION CARE EDUCATION: Basic nutrition: Reviewed protein sources and encouraged 4-5 oz. of protein/equivalents in addition to protein in Ensure Plus with goal of at least 60 grams of protein per day.  Discussed ways to increase calories with peanut butter and cheese since patient will readily eat these as well as other options. Discussed how adequate calories to promote weight gain and adequate protein is a priority. Encouraged to add servings of canned or cooked fruits and soft cooked vegetables as patient can tolerate.   INTERVENTION:  Continue with Ensure Plus 2 x/day. Include a protein food with each meal (eggs, peanut butter,  chicken or other meat) Offer canned fruits and soft cooked vegetables. Add oil or margarine to any appropriate foods to help increase calories. Continue to limit alcohol and eliminate if possible. Continue with multi-vitamin in addition to B12 supplement.  EDUCATION MATERIALS GIVEN:  . Goals/ instructions  LEARNER/ who was taught:  . Patient  . Caregiver/ guardian  LEVEL OF UNDERSTANDING: . Caregiver verbalizes/ demonstrates competency LEARNING BARRIERS: . None for Caregiver . Cognitive limitations (patient)  MONITORING AND EVALUATION:  No follow-up scheduled. Patient's caregiver to call if desires further help with diet/nutrition.

## 2015-09-07 NOTE — Progress Notes (Signed)
States had a fall about 6 weeks ago and continues to have back pain but pain is getting better per pt. Caregivers is with patient and requests paperwork regarding new treatment.

## 2015-09-07 NOTE — Patient Instructions (Addendum)
Continue with Ensure Plus 2 x/day. Include a protein food with each meal (eggs, peanut butter, chicken or other meat) Offer canned fruits and soft cooked vegetables. Add oil or margarine to any appropriate foods to help increase calories. Continue to limit alcohol and eliminate if possible. Continue with multi-vitamin in addition to B12 supplement.

## 2015-09-08 ENCOUNTER — Ambulatory Visit: Payer: Medicare Other | Admitting: Hematology and Oncology

## 2015-09-08 ENCOUNTER — Other Ambulatory Visit: Payer: Medicare Other

## 2015-09-08 NOTE — Progress Notes (Signed)
Spoke to caergiver glenda and the pt and went over side effects of xeloda and reasons to call the office. Pt and caregiver had already listened to instructions on the phone with pharmacy and verbalized understanding of how to take drug and how many pills at each time. Also dr Mike Gip went over the side effects that were common for the med. Pt and caregiver had to questions and should start on med wed.because that is first time to get radiation. Signed consent and it has been sent to be scanned

## 2015-09-09 ENCOUNTER — Ambulatory Visit
Admission: RE | Admit: 2015-09-09 | Discharge: 2015-09-09 | Disposition: A | Discharge status: Home / Self Care | Payer: Medicare Other | Source: Ambulatory Visit | Attending: Radiation Oncology | Admitting: Radiation Oncology

## 2015-09-09 DIAGNOSIS — C20 Malignant neoplasm of rectum: Secondary | ICD-10-CM | POA: Diagnosis not present

## 2015-09-10 ENCOUNTER — Ambulatory Visit
Admission: RE | Admit: 2015-09-10 | Discharge: 2015-09-10 | Disposition: A | Discharge status: Home / Self Care | Payer: Medicare Other | Source: Ambulatory Visit | Attending: Radiation Oncology | Admitting: Radiation Oncology

## 2015-09-10 DIAGNOSIS — C20 Malignant neoplasm of rectum: Secondary | ICD-10-CM | POA: Diagnosis not present

## 2015-09-11 ENCOUNTER — Ambulatory Visit
Admission: RE | Admit: 2015-09-11 | Discharge: 2015-09-11 | Disposition: A | Discharge status: Home / Self Care | Payer: Medicare Other | Source: Ambulatory Visit | Attending: Radiation Oncology | Admitting: Radiation Oncology

## 2015-09-11 DIAGNOSIS — C20 Malignant neoplasm of rectum: Secondary | ICD-10-CM | POA: Diagnosis not present

## 2015-09-15 ENCOUNTER — Ambulatory Visit
Admission: RE | Admit: 2015-09-15 | Discharge: 2015-09-15 | Disposition: A | Discharge status: Home / Self Care | Payer: Medicare Other | Source: Ambulatory Visit | Attending: Radiation Oncology | Admitting: Radiation Oncology

## 2015-09-15 DIAGNOSIS — C20 Malignant neoplasm of rectum: Secondary | ICD-10-CM | POA: Diagnosis not present

## 2015-09-16 ENCOUNTER — Ambulatory Visit
Admission: RE | Admit: 2015-09-16 | Discharge: 2015-09-16 | Disposition: A | Discharge status: Home / Self Care | Payer: Medicare Other | Source: Ambulatory Visit | Attending: Radiation Oncology | Admitting: Radiation Oncology

## 2015-09-16 DIAGNOSIS — C20 Malignant neoplasm of rectum: Secondary | ICD-10-CM | POA: Diagnosis not present

## 2015-09-17 ENCOUNTER — Ambulatory Visit
Admission: RE | Admit: 2015-09-17 | Discharge: 2015-09-17 | Disposition: A | Discharge status: Home / Self Care | Payer: Medicare Other | Source: Ambulatory Visit | Attending: Radiation Oncology | Admitting: Radiation Oncology

## 2015-09-17 DIAGNOSIS — C20 Malignant neoplasm of rectum: Secondary | ICD-10-CM | POA: Diagnosis not present

## 2015-09-18 ENCOUNTER — Ambulatory Visit
Admission: RE | Admit: 2015-09-18 | Discharge: 2015-09-18 | Disposition: A | Discharge status: Home / Self Care | Payer: Medicare Other | Source: Ambulatory Visit | Attending: Radiation Oncology | Admitting: Radiation Oncology

## 2015-09-18 ENCOUNTER — Inpatient Hospital Stay: Payer: Medicare Other | Attending: Family Medicine

## 2015-09-18 ENCOUNTER — Inpatient Hospital Stay (HOSPITAL_BASED_OUTPATIENT_CLINIC_OR_DEPARTMENT_OTHER): Payer: Medicare Other | Admitting: Family Medicine

## 2015-09-18 VITALS — BP 102/66 | HR 76 | Temp 95.2°F | Resp 18 | Wt 131.2 lb

## 2015-09-18 DIAGNOSIS — F0390 Unspecified dementia without behavioral disturbance: Secondary | ICD-10-CM

## 2015-09-18 DIAGNOSIS — K219 Gastro-esophageal reflux disease without esophagitis: Secondary | ICD-10-CM | POA: Insufficient documentation

## 2015-09-18 DIAGNOSIS — E871 Hypo-osmolality and hyponatremia: Secondary | ICD-10-CM | POA: Insufficient documentation

## 2015-09-18 DIAGNOSIS — Z9181 History of falling: Secondary | ICD-10-CM | POA: Diagnosis not present

## 2015-09-18 DIAGNOSIS — E538 Deficiency of other specified B group vitamins: Secondary | ICD-10-CM | POA: Diagnosis not present

## 2015-09-18 DIAGNOSIS — I10 Essential (primary) hypertension: Secondary | ICD-10-CM | POA: Insufficient documentation

## 2015-09-18 DIAGNOSIS — G893 Neoplasm related pain (acute) (chronic): Secondary | ICD-10-CM | POA: Diagnosis not present

## 2015-09-18 DIAGNOSIS — Z87891 Personal history of nicotine dependence: Secondary | ICD-10-CM

## 2015-09-18 DIAGNOSIS — R5383 Other fatigue: Secondary | ICD-10-CM | POA: Insufficient documentation

## 2015-09-18 DIAGNOSIS — Z923 Personal history of irradiation: Secondary | ICD-10-CM | POA: Diagnosis not present

## 2015-09-18 DIAGNOSIS — Z87311 Personal history of (healed) other pathological fracture: Secondary | ICD-10-CM | POA: Diagnosis not present

## 2015-09-18 DIAGNOSIS — R197 Diarrhea, unspecified: Secondary | ICD-10-CM | POA: Diagnosis not present

## 2015-09-18 DIAGNOSIS — C20 Malignant neoplasm of rectum: Secondary | ICD-10-CM

## 2015-09-18 DIAGNOSIS — Z9221 Personal history of antineoplastic chemotherapy: Secondary | ICD-10-CM | POA: Diagnosis not present

## 2015-09-18 DIAGNOSIS — R21 Rash and other nonspecific skin eruption: Secondary | ICD-10-CM | POA: Insufficient documentation

## 2015-09-18 DIAGNOSIS — Z79899 Other long term (current) drug therapy: Secondary | ICD-10-CM

## 2015-09-18 DIAGNOSIS — Z86010 Personal history of colonic polyps: Secondary | ICD-10-CM | POA: Diagnosis not present

## 2015-09-18 DIAGNOSIS — M549 Dorsalgia, unspecified: Secondary | ICD-10-CM

## 2015-09-18 DIAGNOSIS — E785 Hyperlipidemia, unspecified: Secondary | ICD-10-CM | POA: Diagnosis not present

## 2015-09-18 DIAGNOSIS — M5489 Other dorsalgia: Secondary | ICD-10-CM | POA: Insufficient documentation

## 2015-09-18 LAB — COMPREHENSIVE METABOLIC PANEL
ALT: 41 U/L (ref 17–63)
AST: 55 U/L — ABNORMAL HIGH (ref 15–41)
Albumin: 4.2 g/dL (ref 3.5–5.0)
Alkaline Phosphatase: 65 U/L (ref 38–126)
Anion gap: 7 (ref 5–15)
BUN: 13 mg/dL (ref 6–20)
CO2: 27 mmol/L (ref 22–32)
Calcium: 10.3 mg/dL (ref 8.9–10.3)
Chloride: 96 mmol/L — ABNORMAL LOW (ref 101–111)
Creatinine, Ser: 0.66 mg/dL (ref 0.61–1.24)
GFR calc Af Amer: >60 mL/min (ref >60)
GFR calc non Af Amer: >60 mL/min (ref >60)
Glucose, Bld: 104 mg/dL — ABNORMAL HIGH (ref 65–99)
Potassium: 4.5 mmol/L (ref 3.5–5.1)
Sodium: 130 mmol/L — ABNORMAL LOW (ref 135–145)
Total Bilirubin: 1.2 mg/dL (ref 0.3–1.2)
Total Protein: 7.6 g/dL (ref 6.5–8.1)

## 2015-09-18 LAB — CBC WITH DIFFERENTIAL/PLATELET
Basophils Absolute: 0 10*3/uL (ref 0–0.1)
Basophils Relative: 1 %
Eosinophils Absolute: 0.1 10*3/uL (ref 0–0.7)
Eosinophils Relative: 3 %
HCT: 39.7 % — ABNORMAL LOW (ref 40.0–52.0)
Hemoglobin: 13.6 g/dL (ref 13.0–18.0)
Lymphocytes Relative: 21 %
Lymphs Abs: 1.1 10*3/uL (ref 1.0–3.6)
MCH: 31 pg (ref 26.0–34.0)
MCHC: 34.2 g/dL (ref 32.0–36.0)
MCV: 90.6 fL (ref 80.0–100.0)
Monocytes Absolute: 0.7 10*3/uL (ref 0.2–1.0)
Monocytes Relative: 13 %
Neutro Abs: 3.3 10*3/uL (ref 1.4–6.5)
Neutrophils Relative %: 62 %
Platelets: 228 10*3/uL (ref 150–440)
RBC: 4.38 MIL/uL — ABNORMAL LOW (ref 4.40–5.90)
RDW: 13.2 % (ref 11.5–14.5)
WBC: 5.2 10*3/uL (ref 3.8–10.6)

## 2015-09-18 NOTE — Progress Notes (Addendum)
[facility] day:  09/18/2015   Chief Complaint: Viral Schramm. is a 82 y.o. male with rectal cancer who is seen for assessment prior to initiation of neoadjuvant concurrent Xeloda and radiation.  HPI:  The patient was last seen in the medical oncology clinic on 08/21/2015.  At that time, he was seen after interval sigmoidoscopy and endorectal ultrasound.  He was noted to have a T3uN0 low rectal adenocarcinoma.  We discussed neoadjuvant chemotherapy (5FU or Xeloda) with radiation, followed by surgery then completion of 6 months of chemotherapy (5FU or Xeloda).  Side effects were reviewed.    He was seen in consultation by Dr. Baruch Gouty on 08/28/2015.  He is scheduled to begin radiation on 09/09/2015.  He will receive 4500 cGy with a boost to the area of tumor involvement of 540 cGy.  He attended chemotherapy class.  His Xeloda pills should arrive today.    He met with orthopedics regarding his back.  He will not be undergoing kyphoplasty for his T12 compression fracture.  He was prescribed Neurontin.  Symptomatically, he feels "pretty good".  Patient continues to drink 3-4 glasses of wine a day. Patient states that he has had some very mild loose stool just this morning. He otherwise reports feeling well, appetite is good.  Past Medical History  Diagnosis Date  . Hypertension   . Glaucoma   . Colon polyp   . Allergic rhinitis   . Substance abuse     alcohol  . Hyperlipidemia   . Anemia   . Dementia     memory issues  . GERD (gastroesophageal reflux disease)   . Fatty liver   . B12 deficiency 06/29/2015  . Benign neoplasm of ascending colon   . Benign neoplasm of descending colon   . DD (diverticular disease) 06/29/2015  . Rectal adenocarcinoma ([place]) 07/15/2015  . Traumatic intraparenchymal hemorrhage [facility])     Past Surgical History  Procedure Laterality Date  . Colonoscopy with propofol N/A 07/10/2015    Procedure: COLONOSCOPY  WITH PROPOFOL;  Surgeon: Lucilla Lame, MD;  Location: [place];  Service: Endoscopy;  Laterality: N/A;  PER CAREGIVER WAS TOLD THAT PT WOULD BE 1ST (KEEP PT EARLY AM)  . Polypectomy  07/10/2015    Procedure: POLYPECTOMY;  Surgeon: Lucilla Lame, MD;  Location: [place];  Service: Endoscopy;;  . Cataract extraction w/ intraocular lens implant      Family History  Problem Relation Age of Onset  . Cancer Father     Prostate    Social History:  reports that he quit smoking about 37 years ago. His smoking use included Pipe. He has never used smokeless tobacco. He reports that he drinks about 16.8 oz of alcohol per week. He reports that he does not use illicit drugs.  He drinks a bottle of wine a day.  The patient is widowed.  He has no children.  The patient is accompanied by his caregiver, [name], today.  Allergies: No Known Allergies  Current Medications: Current Outpatient Prescriptions  Medication Sig Dispense Refill  . brimonidine-timolol (COMBIGAN) 0.2-0.5 % ophthalmic solution Place 1 drop into both eyes 2 (two) times daily.    . capecitabine (XELODA) 150 MG tablet Take two 150 mg tablets (total 300 mg) twice a day Monday-Friday during radiation 100 tablet 0  . capecitabine (XELODA) 500 MG tablet Take two 500 mg tablets (1000 mg total) twice a day on Monday - Friday during radiation 100  tablet 0  . cetirizine (ZYRTEC) 10 MG tablet Take 10 mg by mouth daily.     . cholecalciferol (VITAMIN D) 1000 units tablet Take 1,000 Units by mouth daily.    Marland Kitchen donepezil (ARICEPT) 10 MG tablet Take 10 mg by mouth at bedtime.    . fluticasone (FLONASE) 50 MCG/ACT nasal spray Place 2 sprays into both nostrils daily as needed for rhinitis.     . folic acid (FOLVITE) 1 MG tablet Take 1 tablet (1 mg total) by mouth daily. 30 tablet 2  . gabapentin (NEURONTIN) 100 MG capsule     . hydrocortisone (ANUSOL-HC) 25 MG suppository Place 1 suppository (25 mg total) rectally 2 (two) times  daily. 12 suppository 0  . metoprolol succinate (TOPROL XL) 25 MG 24 hr tablet Take 1 tablet (25 mg total) by mouth daily. 30 tablet 0  . montelukast (SINGULAIR) 10 MG tablet Take 10 mg by mouth at bedtime. Reported on 08/21/2015    . mupirocin ointment (BACTROBAN) 2 % Apply 1 application topically daily as needed.   0  . omeprazole (PRILOSEC) 20 MG capsule Take 1 capsule (20 mg total) by mouth daily. 30 capsule 0  . ondansetron (ZOFRAN) 8 MG tablet Take 1 tablet (8 mg total) by mouth every 8 (eight) hours as needed for nausea or vomiting. 20 tablet 1  . thiamine 100 MG tablet Take 1 tablet (100 mg total) by mouth daily. 30 tablet 2  . valsartan (DIOVAN) 80 MG tablet Take 80 mg by mouth daily.    . vitamin B-12 (CYANOCOBALAMIN) 1000 MCG tablet Take 1,000 mcg by mouth daily.     No current facility-administered medications for this visit.    Review of Systems:  GENERAL:  Feels "pretty good".  No fevers, sweats or weight loss. PERFORMANCE STATUS (ECOG):  2 HEENT:  Allergies.  No visual changes, sore throat, mouth sores or tenderness. Lungs: No shortness of breath or cough.  No hemoptysis. Cardiac:  No chest pain, palpitations, orthopnea, or PND. GI:  1 loose BM otherwise soft bowel movements.  No interval bleeding.  No nausea, vomiting, diarrhea, or constipation. GU:  No urgency, frequency, dysuria, or hematuria. Musculoskeletal:  Back pain secondary to T12 compression fracture.  Left shoulder sore s/p fracture.  No muscle tenderness. Extremities:  No pain or swelling. Skin:  No rashes or skin changes. Neuro:  Mild dementia.  Balance and coordination off (chronic).  No headache, numbness or weakness. Endocrine:  No diabetes, thyroid issues, hot flashes or night sweats. Psych:  No mood changes, depression or anxiety. Pain:  Back pain, managed by medications. Review of systems:  All other systems reviewed and found to be negative.  Physical Exam: Blood pressure 102/66, pulse 76, temperature  95.2 F (35.1 C), temperature source Tympanic, resp. rate 18, weight 131 lb 2.8 oz (59.5 kg). GENERAL:  Well developed, well nourished, elderly gentleman sitting comfortably in the exam room in no acute distress.  He has a rolling walker at his side. MENTAL STATUS:  Alert and oriented to person, place and time. HEAD: Pearline Cables hair.  Normocephalic, atraumatic, face symmetric, no Cushingoid features. EYES:  Glasses.  Blue eyes.  Pupils equal round and reactive to light and accomodation.  No conjunctivitis or scleral icterus. ENT:  Oropharynx clear without lesion.  Tongue normal.  Mucous membranes moist.  RESPIRATORY:  Clear to auscultation without rales, wheezes or rhonchi. CARDIOVASCULAR:  Regular rate and rhythm without murmur, rub or gallop. ABDOMEN:  Soft, non-tender, with active bowel sounds,  and no hepatosplenomegaly.  No masses. SKIN:  Few small ecchymosis on arms.  No rashes, ulcers or lesions. EXTREMITIES: No edema, no skin discoloration or tenderness.  No palpable cords. LYMPH NODES: No palpable cervical, supraclavicular, axillary or inguinal adenopathy  NEUROLOGICAL: Unremarkable. PSYCH:  Appropriate.  Appointment on 09/18/2015  Component Date Value Ref Range Status  . WBC 09/18/2015 5.2  3.8 - 10.6 K/uL Final  . RBC 09/18/2015 4.38* 4.40 - 5.90 MIL/uL Final  . Hemoglobin 09/18/2015 13.6  13.0 - 18.0 g/dL Final  . HCT 09/18/2015 39.7* 40.0 - 52.0 % Final  . MCV 09/18/2015 90.6  80.0 - 100.0 fL Final  . MCH 09/18/2015 31.0  26.0 - 34.0 pg Final  . MCHC 09/18/2015 34.2  32.0 - 36.0 g/dL Final  . RDW 09/18/2015 13.2  11.5 - 14.5 % Final  . Platelets 09/18/2015 228  150 - 440 K/uL Final  . Neutrophils Relative % 09/18/2015 62   Final  . Neutro Abs 09/18/2015 3.3  1.4 - 6.5 K/uL Final  . Lymphocytes Relative 09/18/2015 21   Final  . Lymphs Abs 09/18/2015 1.1  1.0 - 3.6 K/uL Final  . Monocytes Relative 09/18/2015 13   Final  . Monocytes Absolute 09/18/2015 0.7  0.2 - 1.0 K/uL Final  .  Eosinophils Relative 09/18/2015 3   Final  . Eosinophils Absolute 09/18/2015 0.1  0 - 0.7 K/uL Final  . Basophils Relative 09/18/2015 1   Final  . Basophils Absolute 09/18/2015 0.0  0 - 0.1 K/uL Final  . Sodium 09/18/2015 130* 135 - 145 mmol/L Final  . Potassium 09/18/2015 4.5  3.5 - 5.1 mmol/L Final  . Chloride 09/18/2015 96* 101 - 111 mmol/L Final  . CO2 09/18/2015 27  22 - 32 mmol/L Final  . Glucose, Bld 09/18/2015 104* 65 - 99 mg/dL Final  . BUN 09/18/2015 13  6 - 20 mg/dL Final  . Creatinine, Ser 09/18/2015 0.66  0.61 - 1.24 mg/dL Final  . Calcium 09/18/2015 10.3  8.9 - 10.3 mg/dL Final  . Total Protein 09/18/2015 7.6  6.5 - 8.1 g/dL Final  . Albumin 09/18/2015 4.2  3.5 - 5.0 g/dL Final  . AST 09/18/2015 55* 15 - 41 U/L Final  . ALT 09/18/2015 41  17 - 63 U/L Final  . Alkaline Phosphatase 09/18/2015 65  38 - 126 U/L Final  . Total Bilirubin 09/18/2015 1.2  0.3 - 1.2 mg/dL Final  . GFR calc non Af Amer 09/18/2015 >60  >60 mL/min Final  . GFR calc Af Amer 09/18/2015 >60  >60 mL/min Final   Comment: (NOTE) The eGFR has been calculated using the CKD EPI equation. This calculation has not been validated in all clinical situations. eGFR's persistently <60 mL/min signify possible Chronic Kidney Disease.   Georgiann Hahn gap 09/18/2015 7  5 - 15 Final    Assessment:  [name] is a 82 y.o. male with a T3uN0 low rectal adenocarcinoma.  He presented with rectal bleeding.  Colonoscopy on 07/10/2015 revealed a 4 cm frond-like/villous ulcerated non-obstructing mass in the rectum.  Biopsy revealed adenocarcinoma.  Chest, abdomen, and pelvic CT scan on 07/27/2015 revealed a circumferential soft tissue mass within the low rectum. He was a T12 compression fracture.  CEA was 4.1 on 07/26/2015.  Flexible sigmoidoscopy and ultrasound on 08/19/2015 revealed a 3.0 x 1.4 cm hypoechoic non-circumferential mass was found in the rectum.  There was no sonographic evidence suggesting breakthrough of  the muscularis propria with invasion into  the perirectal fat.  No lymph nodes were seen in the perirectal region and in the left iliac region. Stage was early T3uN0.  He was admitted to [facility] from 07/24/2015 - 07/30/2015 with acute right basal ganglia hemorrhage and left humeral head fracture s/p a fall on 07/21/2015. Since discharge home, he has had 24 hour home care. He is fairly independent with a walker.    He was admitted to [facility] in [place] from 08/08/2015 - 08/09/2015 with acute diverticulitis.  He was discharged home on Augmentin rather than Flagyl given his alcohol dependency.  He drinks alcohol daily.  He has mild dementia.  Performance status is 2.  Symptomatically, he denies rectal bleeding.  He reports having 1 loose bowel movement this morning. Advised to contact our office if he had more than 4 loose movements per day. Also instructed patient to obtain some Imodium right ear to keep on hand in case he needed it, caregiver was in agreement. He has back pain secondary to a compression fracture.  Exam is stable.   Dr. Rogue Bussing was available for consultation and review of plan of care for this patient.  Plan: 1.  Labs today:  CBC with diff, CMP. 2.  Review chemotherapy plan.  He will take Xeloda 1300 mg BID on days of radiation (Monday-Friday) only.  Side effects were reviewed by me and my nurse, Moishe Spice.  The patient consented to treatment. 3.  Continue Xeloda 1300 mg po BID and radiation as directed above. 4.  Encourage continued decreased alcohol intake. 5.  RTC on 09/25/2015 for MD assessment and labs (CBC with diff, CMP).   Evlyn Kanner, NP  09/18/2015, 1:43 PM

## 2015-09-18 NOTE — Progress Notes (Signed)
Pt complains of mild back pain related to fall about 2 months ago. Pain is well controlled with pain medication and pt has noticed improvement in back pain.

## 2015-09-21 ENCOUNTER — Ambulatory Visit
Admission: RE | Admit: 2015-09-21 | Discharge: 2015-09-21 | Disposition: A | Discharge status: Home / Self Care | Payer: Medicare Other | Source: Ambulatory Visit | Attending: Radiation Oncology | Admitting: Radiation Oncology

## 2015-09-21 DIAGNOSIS — C20 Malignant neoplasm of rectum: Secondary | ICD-10-CM | POA: Diagnosis not present

## 2015-09-22 ENCOUNTER — Ambulatory Visit
Admission: RE | Admit: 2015-09-22 | Discharge: 2015-09-22 | Disposition: A | Discharge status: Home / Self Care | Payer: Medicare Other | Source: Ambulatory Visit | Attending: Radiation Oncology | Admitting: Radiation Oncology

## 2015-09-22 DIAGNOSIS — C20 Malignant neoplasm of rectum: Secondary | ICD-10-CM | POA: Diagnosis not present

## 2015-09-23 ENCOUNTER — Ambulatory Visit
Admission: RE | Admit: 2015-09-23 | Discharge: 2015-09-23 | Disposition: A | Discharge status: Home / Self Care | Payer: Medicare Other | Source: Ambulatory Visit | Attending: Radiation Oncology | Admitting: Radiation Oncology

## 2015-09-23 DIAGNOSIS — C20 Malignant neoplasm of rectum: Secondary | ICD-10-CM | POA: Diagnosis not present

## 2015-09-24 ENCOUNTER — Ambulatory Visit
Admission: RE | Admit: 2015-09-24 | Discharge: 2015-09-24 | Disposition: A | Discharge status: Home / Self Care | Payer: Medicare Other | Source: Ambulatory Visit | Attending: Radiation Oncology | Admitting: Radiation Oncology

## 2015-09-24 DIAGNOSIS — C20 Malignant neoplasm of rectum: Secondary | ICD-10-CM | POA: Diagnosis not present

## 2015-09-25 ENCOUNTER — Inpatient Hospital Stay (HOSPITAL_BASED_OUTPATIENT_CLINIC_OR_DEPARTMENT_OTHER): Payer: Medicare Other | Admitting: Hematology and Oncology

## 2015-09-25 ENCOUNTER — Ambulatory Visit
Admission: RE | Admit: 2015-09-25 | Discharge: 2015-09-25 | Disposition: A | Discharge status: Home / Self Care | Payer: Medicare Other | Source: Ambulatory Visit | Attending: Radiation Oncology | Admitting: Radiation Oncology

## 2015-09-25 ENCOUNTER — Inpatient Hospital Stay: Payer: Medicare Other

## 2015-09-25 VITALS — BP 101/55 | HR 49 | Temp 95.0°F | Resp 18 | Ht 65.0 in | Wt 131.0 lb

## 2015-09-25 DIAGNOSIS — I10 Essential (primary) hypertension: Secondary | ICD-10-CM

## 2015-09-25 DIAGNOSIS — F0390 Unspecified dementia without behavioral disturbance: Secondary | ICD-10-CM

## 2015-09-25 DIAGNOSIS — C20 Malignant neoplasm of rectum: Secondary | ICD-10-CM | POA: Diagnosis not present

## 2015-09-25 DIAGNOSIS — R5383 Other fatigue: Secondary | ICD-10-CM

## 2015-09-25 DIAGNOSIS — G893 Neoplasm related pain (acute) (chronic): Secondary | ICD-10-CM

## 2015-09-25 DIAGNOSIS — Z87311 Personal history of (healed) other pathological fracture: Secondary | ICD-10-CM

## 2015-09-25 DIAGNOSIS — K219 Gastro-esophageal reflux disease without esophagitis: Secondary | ICD-10-CM

## 2015-09-25 DIAGNOSIS — M5489 Other dorsalgia: Secondary | ICD-10-CM

## 2015-09-25 DIAGNOSIS — R197 Diarrhea, unspecified: Secondary | ICD-10-CM

## 2015-09-25 DIAGNOSIS — Z86010 Personal history of colonic polyps: Secondary | ICD-10-CM

## 2015-09-25 DIAGNOSIS — Z9181 History of falling: Secondary | ICD-10-CM

## 2015-09-25 DIAGNOSIS — Z79899 Other long term (current) drug therapy: Secondary | ICD-10-CM

## 2015-09-25 DIAGNOSIS — Z87891 Personal history of nicotine dependence: Secondary | ICD-10-CM

## 2015-09-25 DIAGNOSIS — E785 Hyperlipidemia, unspecified: Secondary | ICD-10-CM

## 2015-09-25 LAB — COMPREHENSIVE METABOLIC PANEL
ALT: 37 U/L (ref 17–63)
AST: 49 U/L — ABNORMAL HIGH (ref 15–41)
Albumin: 3.9 g/dL (ref 3.5–5.0)
Alkaline Phosphatase: 59 U/L (ref 38–126)
Anion gap: 4 — ABNORMAL LOW (ref 5–15)
BUN: 13 mg/dL (ref 6–20)
CO2: 27 mmol/L (ref 22–32)
Calcium: 9.8 mg/dL (ref 8.9–10.3)
Chloride: 99 mmol/L — ABNORMAL LOW (ref 101–111)
Creatinine, Ser: 0.73 mg/dL (ref 0.61–1.24)
GFR calc Af Amer: >60 mL/min (ref >60)
GFR calc non Af Amer: >60 mL/min (ref >60)
Glucose, Bld: 116 mg/dL — ABNORMAL HIGH (ref 65–99)
Potassium: 4.2 mmol/L (ref 3.5–5.1)
Sodium: 130 mmol/L — ABNORMAL LOW (ref 135–145)
Total Bilirubin: 1 mg/dL (ref 0.3–1.2)
Total Protein: 6.9 g/dL (ref 6.5–8.1)

## 2015-09-25 LAB — CBC WITH DIFFERENTIAL/PLATELET
Basophils Absolute: 0 10*3/uL (ref 0–0.1)
Basophils Relative: 1 %
Eosinophils Absolute: 0.2 10*3/uL (ref 0–0.7)
Eosinophils Relative: 6 %
HCT: 37.6 % — ABNORMAL LOW (ref 40.0–52.0)
Hemoglobin: 13 g/dL (ref 13.0–18.0)
Lymphocytes Relative: 25 %
Lymphs Abs: 0.9 10*3/uL — ABNORMAL LOW (ref 1.0–3.6)
MCH: 31.4 pg (ref 26.0–34.0)
MCHC: 34.4 g/dL (ref 32.0–36.0)
MCV: 91.1 fL (ref 80.0–100.0)
Monocytes Absolute: 0.5 10*3/uL (ref 0.2–1.0)
Monocytes Relative: 13 %
Neutro Abs: 2 10*3/uL (ref 1.4–6.5)
Neutrophils Relative %: 55 %
Platelets: 141 10*3/uL — ABNORMAL LOW (ref 150–440)
RBC: 4.13 MIL/uL — ABNORMAL LOW (ref 4.40–5.90)
RDW: 13.2 % (ref 11.5–14.5)
WBC: 3.6 10*3/uL — ABNORMAL LOW (ref 3.8–10.6)

## 2015-09-25 NOTE — Progress Notes (Signed)
Pt report mild fatigue, left cheek redness does not itch nor is painful. Pt also reports diarrhea less than 4 stools a day this week

## 2015-09-25 NOTE — Progress Notes (Signed)
[facility] day:  09/25/2015  Chief Complaint: Kenneth Luna. is a 82 y.o. male with rectal cancer who is seen for weekly assessment during neoadjuvant concurrent Xeloda and radiation.  HPI:  The patient was last seen by me in the medical oncology clinic on 09/07/2015.  At that time, he denied any rectal bleeding.  He had back pain secondary to a compression fracture.  Exam was stable.  We discussed initiation of Xeloda and radiation.  He began radiation on 09/09/2015.  He was seen by Georgeanne Nim, NP, on 09/18/2015.  At that time, he noted one loose stool.  He was to continue Xeloda BID during days of radiation (Monday-Friday).  He was encouraged to decrease his alcohol intake.  During the interim, he has done well.  He notes ":one attack of diarrhea last Tuesday (09/15/2015).  He has needed to use imodium once.  He denies any mouth sores or tenderness.  He denies any hand foot syndrome.  He denies any hematochezia.  He notes a little rectal tenderness at the end of his bowl movement.  He continues to drink daily (3 glasses of wine).  He tries to sip wine all during the day (bottle taken away).   Past Medical History  Diagnosis Date  . Hypertension   . Glaucoma   . Colon polyp   . Allergic rhinitis   . Substance abuse     alcohol  . Hyperlipidemia   . Anemia   . Dementia     memory issues  . GERD (gastroesophageal reflux disease)   . Fatty liver   . B12 deficiency 06/29/2015  . Benign neoplasm of ascending colon   . Benign neoplasm of descending colon   . DD (diverticular disease) 06/29/2015  . Rectal adenocarcinoma ([place]) 07/15/2015  . Traumatic intraparenchymal hemorrhage [facility])     Past Surgical History  Procedure Laterality Date  . Colonoscopy with propofol N/A 07/10/2015    Procedure: COLONOSCOPY WITH PROPOFOL;  Surgeon: Lucilla Lame, MD;  Location: [place];  Service: Endoscopy;  Laterality: N/A;  PER CAREGIVER  WAS TOLD THAT PT WOULD BE 1ST (KEEP PT EARLY AM)  . Polypectomy  07/10/2015    Procedure: POLYPECTOMY;  Surgeon: Lucilla Lame, MD;  Location: [place];  Service: Endoscopy;;  . Cataract extraction w/ intraocular lens implant      Family History  Problem Relation Age of Onset  . Cancer Father     Prostate    Social History:  reports that he quit smoking about 37 years ago. His smoking use included Pipe. He has never used smokeless tobacco. He reports that he drinks about 16.8 oz of alcohol per week. He reports that he does not use illicit drugs.  He drinks a bottle of wine a day.  The patient is widowed.  He has no children.  The patient is accompanied by his caregiver, [name], today.  Allergies: No Known Allergies  Current Medications: Current Outpatient Prescriptions  Medication Sig Dispense Refill  . brimonidine-timolol (COMBIGAN) 0.2-0.5 % ophthalmic solution Place 1 drop into both eyes 2 (two) times daily.    . capecitabine (XELODA) 150 MG tablet Take two 150 mg tablets (total 300 mg) twice a day Monday-Friday during radiation 100 tablet 0  . capecitabine (XELODA) 500 MG tablet Take two 500 mg tablets (1000 mg total) twice a day on Monday - Friday during radiation 100 tablet 0  . cetirizine (ZYRTEC) 10 MG tablet Take  10 mg by mouth daily.     . cholecalciferol (VITAMIN D) 1000 units tablet Take 1,000 Units by mouth daily.    Marland Kitchen donepezil (ARICEPT) 10 MG tablet Take 10 mg by mouth at bedtime.    . fluticasone (FLONASE) 50 MCG/ACT nasal spray Place 2 sprays into both nostrils daily as needed for rhinitis.     . folic acid (FOLVITE) 1 MG tablet Take 1 tablet (1 mg total) by mouth daily. 30 tablet 2  . gabapentin (NEURONTIN) 100 MG capsule     . hydrocortisone (ANUSOL-HC) 25 MG suppository Place 1 suppository (25 mg total) rectally 2 (two) times daily. 12 suppository 0  . metoprolol succinate (TOPROL XL) 25 MG 24 hr tablet Take 1 tablet (25 mg total) by mouth daily. 30  tablet 0  . montelukast (SINGULAIR) 10 MG tablet Take 10 mg by mouth at bedtime. Reported on 08/21/2015    . mupirocin ointment (BACTROBAN) 2 % Apply 1 application topically daily as needed.   0  . omeprazole (PRILOSEC) 20 MG capsule Take 1 capsule (20 mg total) by mouth daily. 30 capsule 0  . ondansetron (ZOFRAN) 8 MG tablet Take 1 tablet (8 mg total) by mouth every 8 (eight) hours as needed for nausea or vomiting. 20 tablet 1  . thiamine 100 MG tablet Take 1 tablet (100 mg total) by mouth daily. 30 tablet 2  . valsartan (DIOVAN) 80 MG tablet Take 80 mg by mouth daily.    . vitamin B-12 (CYANOCOBALAMIN) 1000 MCG tablet Take 1,000 mcg by mouth daily.     No current facility-administered medications for this visit.    Review of Systems:  GENERAL:  Feels "good".  No fevers, sweats or weight loss. PERFORMANCE STATUS (ECOG):  2 HEENT:  Allergies.  No visual changes, sore throat, mouth sores or tenderness. Lungs: No shortness of breath or cough.  No hemoptysis. Cardiac:  No chest pain, palpitations, orthopnea, or PND. GI:  Mild rectal tenderness.  No rectal bleeding.  One episode of diarrhea.  No nausea, vomiting or constipation. GU:  No urgency, frequency, dysuria, or hematuria. Musculoskeletal:  Back pain secondary to T12 compression fracture.  Left shoulder sore s/p fracture.  No muscle tenderness. Extremities:  No pain or swelling. Skin:  No rashes or skin changes. Neuro:  Mild dementia.  Balance and coordination off (chronic).  No headache, numbness or weakness. Endocrine:  No diabetes, thyroid issues, hot flashes or night sweats. Psych:  No mood changes, depression or anxiety. Pain:  Back pain, managed by medications. Review of systems:  All other systems reviewed and found to be negative.  Physical Exam: Blood pressure 101/55, pulse 49, temperature 95 F (35 C), temperature source Tympanic, resp. rate 18, height '5\' 5"'  (1.651 m), weight 130 lb 15.3 oz (59.4 kg). GENERAL:  Well  developed, well nourished, elderly gentleman sitting comfortably in the exam room in no acute distress.  He has a rolling walker at his side. MENTAL STATUS:  Alert and oriented to person, place and time. HEAD: Pearline Cables hair.  Normocephalic, atraumatic, face symmetric, no Cushingoid features. EYES:  Glasses.  Blue eyes.  Pupils equal round and reactive to light and accomodation.  No conjunctivitis or scleral icterus. ENT:  Oropharynx clear without lesion.  Tongue normal.  Mucous membranes moist.  RESPIRATORY:  Clear to auscultation without rales, wheezes or rhonchi. CARDIOVASCULAR:  Regular rate and rhythm without murmur, rub or gallop. ABDOMEN:  Soft, non-tender, with active bowel sounds, and no hepatosplenomegaly.  No masses.  RECTAL:  No perirectal breakdown or erythema.  Mild hyperpigmentation. Non inflamed hemorrhoid. SKIN:  Few small ecchymosis on arms.  No palmar erythema or desquamation.  No rashes, ulcers or lesions. EXTREMITIES: No edema, no skin discoloration or tenderness.  No palpable cords. LYMPH NODES: No palpable cervical, supraclavicular, axillary or inguinal adenopathy  NEUROLOGICAL: Unremarkable. PSYCH:  Appropriate.   Appointment on 09/25/2015  Component Date Value Ref Range Status  . WBC 09/25/2015 3.6* 3.8 - 10.6 K/uL Final  . RBC 09/25/2015 4.13* 4.40 - 5.90 MIL/uL Final  . Hemoglobin 09/25/2015 13.0  13.0 - 18.0 g/dL Final  . HCT 09/25/2015 37.6* 40.0 - 52.0 % Final  . MCV 09/25/2015 91.1  80.0 - 100.0 fL Final  . MCH 09/25/2015 31.4  26.0 - 34.0 pg Final  . MCHC 09/25/2015 34.4  32.0 - 36.0 g/dL Final  . RDW 09/25/2015 13.2  11.5 - 14.5 % Final  . Platelets 09/25/2015 141* 150 - 440 K/uL Final  . Neutrophils Relative % 09/25/2015 55   Final  . Neutro Abs 09/25/2015 2.0  1.4 - 6.5 K/uL Final  . Lymphocytes Relative 09/25/2015 25   Final  . Lymphs Abs 09/25/2015 0.9* 1.0 - 3.6 K/uL Final  . Monocytes Relative 09/25/2015 13   Final  . Monocytes Absolute 09/25/2015 0.5   0.2 - 1.0 K/uL Final  . Eosinophils Relative 09/25/2015 6   Final  . Eosinophils Absolute 09/25/2015 0.2  0 - 0.7 K/uL Final  . Basophils Relative 09/25/2015 1   Final  . Basophils Absolute 09/25/2015 0.0  0 - 0.1 K/uL Final  . Sodium 09/25/2015 130* 135 - 145 mmol/L Final  . Potassium 09/25/2015 4.2  3.5 - 5.1 mmol/L Final  . Chloride 09/25/2015 99* 101 - 111 mmol/L Final  . CO2 09/25/2015 27  22 - 32 mmol/L Final  . Glucose, Bld 09/25/2015 116* 65 - 99 mg/dL Final  . BUN 09/25/2015 13  6 - 20 mg/dL Final  . Creatinine, Ser 09/25/2015 0.73  0.61 - 1.24 mg/dL Final  . Calcium 09/25/2015 9.8  8.9 - 10.3 mg/dL Final  . Total Protein 09/25/2015 6.9  6.5 - 8.1 g/dL Final  . Albumin 09/25/2015 3.9  3.5 - 5.0 g/dL Final  . AST 09/25/2015 49* 15 - 41 U/L Final  . ALT 09/25/2015 37  17 - 63 U/L Final  . Alkaline Phosphatase 09/25/2015 59  38 - 126 U/L Final  . Total Bilirubin 09/25/2015 1.0  0.3 - 1.2 mg/dL Final  . GFR calc non Af Amer 09/25/2015 >60  >60 mL/min Final  . GFR calc Af Amer 09/25/2015 >60  >60 mL/min Final   Comment: (NOTE) The eGFR has been calculated using the CKD EPI equation. This calculation has not been validated in all clinical situations. eGFR's persistently <60 mL/min signify possible Chronic Kidney Disease.   Georgiann Hahn gap 09/25/2015 4* 5 - 15 Final    Assessment:  Kenneth Luna. is a 82 y.o. male with a T3uN0 low rectal adenocarcinoma.  He presented with rectal bleeding.  Colonoscopy on 07/10/2015 revealed a 4 cm frond-like/villous ulcerated non-obstructing mass in the rectum.  Biopsy revealed adenocarcinoma.  Chest, abdomen, and pelvic CT scan on 07/27/2015 revealed a circumferential soft tissue mass within the low rectum. He was a T12 compression fracture.  CEA was 4.1 on 07/26/2015.  Flexible sigmoidoscopy and ultrasound on 08/19/2015 revealed a 3.0 x 1.4 cm hypoechoic non-circumferential mass was found in the rectum.  There was no sonographic evidence  suggesting breakthrough of the muscularis propria with invasion into the perirectal fat.  No lymph nodes were seen in the perirectal region and in the left iliac region. Stage was early T3uN0.  He was admitted to [facility] from 07/24/2015 - 07/30/2015 with acute right basal ganglia hemorrhage and left humeral head fracture s/p a fall on 07/21/2015.  He has 24 hour home care.  He is fairly independent with a walker.    He was admitted to [facility] in [place] from 08/08/2015 - 08/09/2015 with acute diverticulitis.  He was discharged home on Augmentin rather than Flagyl given his alcohol dependency.  He drinks alcohol daily (3 glasses of wine).  He has mild dementia.   He is status post 2 weeks of concurrent Xeloda and radiation (began 09/09/2015).  He had had one episode of diarrhea.  He has no mouth sores or hand foot syndrome.  Symptomatically, he has some mild rectal tenderness.  He denies rectal bleeding.  Rectal exam is unremarkable.    Plan: 1.  Labs today:  CBC with diff, CMP. 2.  Continue Xeloda 1300 mg BID on days of radiation (Monday-Friday) only.   3.  Encourage continued decreased alcohol intake. 4.  RTC in 1 week for MD assessment and labs (CBC with diff, CMP, Mg).   Lequita Asal, MD  09/25/2015, 8:46 AM

## 2015-09-27 ENCOUNTER — Encounter: Payer: Self-pay | Admitting: Hematology and Oncology

## 2015-09-28 ENCOUNTER — Ambulatory Visit
Admission: RE | Admit: 2015-09-28 | Discharge: 2015-09-28 | Disposition: A | Discharge status: Home / Self Care | Payer: Medicare Other | Source: Ambulatory Visit | Attending: Radiation Oncology | Admitting: Radiation Oncology

## 2015-09-28 DIAGNOSIS — C20 Malignant neoplasm of rectum: Secondary | ICD-10-CM | POA: Diagnosis not present

## 2015-09-29 ENCOUNTER — Ambulatory Visit
Admission: RE | Admit: 2015-09-29 | Discharge: 2015-09-29 | Disposition: A | Discharge status: Home / Self Care | Payer: Medicare Other | Source: Ambulatory Visit | Attending: Radiation Oncology | Admitting: Radiation Oncology

## 2015-09-29 DIAGNOSIS — C20 Malignant neoplasm of rectum: Secondary | ICD-10-CM | POA: Diagnosis not present

## 2015-09-30 ENCOUNTER — Ambulatory Visit
Admission: RE | Admit: 2015-09-30 | Discharge: 2015-09-30 | Disposition: A | Discharge status: Home / Self Care | Payer: Medicare Other | Source: Ambulatory Visit | Attending: Radiation Oncology | Admitting: Radiation Oncology

## 2015-09-30 DIAGNOSIS — C20 Malignant neoplasm of rectum: Secondary | ICD-10-CM | POA: Diagnosis not present

## 2015-10-01 ENCOUNTER — Ambulatory Visit
Admission: RE | Admit: 2015-10-01 | Discharge: 2015-10-01 | Disposition: A | Discharge status: Home / Self Care | Payer: Medicare Other | Source: Ambulatory Visit | Attending: Radiation Oncology | Admitting: Radiation Oncology

## 2015-10-01 DIAGNOSIS — C20 Malignant neoplasm of rectum: Secondary | ICD-10-CM | POA: Diagnosis not present

## 2015-10-02 ENCOUNTER — Encounter: Payer: Self-pay | Admitting: Hematology and Oncology

## 2015-10-02 ENCOUNTER — Inpatient Hospital Stay: Payer: Medicare Other

## 2015-10-02 ENCOUNTER — Inpatient Hospital Stay (HOSPITAL_BASED_OUTPATIENT_CLINIC_OR_DEPARTMENT_OTHER): Payer: Medicare Other | Admitting: Hematology and Oncology

## 2015-10-02 ENCOUNTER — Ambulatory Visit
Admission: RE | Admit: 2015-10-02 | Discharge: 2015-10-02 | Disposition: A | Discharge status: Home / Self Care | Payer: Medicare Other | Source: Ambulatory Visit | Attending: Radiation Oncology | Admitting: Radiation Oncology

## 2015-10-02 VITALS — BP 95/60 | HR 61 | Temp 95.8°F | Resp 18 | Ht 65.0 in | Wt 130.6 lb

## 2015-10-02 DIAGNOSIS — Z923 Personal history of irradiation: Secondary | ICD-10-CM

## 2015-10-02 DIAGNOSIS — Z9221 Personal history of antineoplastic chemotherapy: Secondary | ICD-10-CM

## 2015-10-02 DIAGNOSIS — F0390 Unspecified dementia without behavioral disturbance: Secondary | ICD-10-CM

## 2015-10-02 DIAGNOSIS — R5383 Other fatigue: Secondary | ICD-10-CM | POA: Diagnosis not present

## 2015-10-02 DIAGNOSIS — C20 Malignant neoplasm of rectum: Secondary | ICD-10-CM

## 2015-10-02 DIAGNOSIS — E538 Deficiency of other specified B group vitamins: Secondary | ICD-10-CM

## 2015-10-02 DIAGNOSIS — Z87311 Personal history of (healed) other pathological fracture: Secondary | ICD-10-CM

## 2015-10-02 DIAGNOSIS — M5489 Other dorsalgia: Secondary | ICD-10-CM

## 2015-10-02 DIAGNOSIS — G893 Neoplasm related pain (acute) (chronic): Secondary | ICD-10-CM

## 2015-10-02 LAB — COMPREHENSIVE METABOLIC PANEL
ALT: 30 U/L (ref 17–63)
AST: 40 U/L (ref 15–41)
Albumin: 3.8 g/dL (ref 3.5–5.0)
Alkaline Phosphatase: 55 U/L (ref 38–126)
Anion gap: 5 (ref 5–15)
BUN: 10 mg/dL (ref 6–20)
CO2: 27 mmol/L (ref 22–32)
Calcium: 9.8 mg/dL (ref 8.9–10.3)
Chloride: 100 mmol/L — ABNORMAL LOW (ref 101–111)
Creatinine, Ser: 0.69 mg/dL (ref 0.61–1.24)
GFR calc Af Amer: >60 mL/min (ref >60)
GFR calc non Af Amer: >60 mL/min (ref >60)
Glucose, Bld: 121 mg/dL — ABNORMAL HIGH (ref 65–99)
Potassium: 4 mmol/L (ref 3.5–5.1)
Sodium: 132 mmol/L — ABNORMAL LOW (ref 135–145)
Total Bilirubin: 0.9 mg/dL (ref 0.3–1.2)
Total Protein: 7 g/dL (ref 6.5–8.1)

## 2015-10-02 LAB — CBC WITH DIFFERENTIAL/PLATELET
Basophils Absolute: 0 10*3/uL (ref 0–0.1)
Basophils Relative: 1 %
Eosinophils Absolute: 0.5 10*3/uL (ref 0–0.7)
Eosinophils Relative: 12 %
HCT: 37.2 % — ABNORMAL LOW (ref 40.0–52.0)
Hemoglobin: 12.8 g/dL — ABNORMAL LOW (ref 13.0–18.0)
Lymphocytes Relative: 18 %
Lymphs Abs: 0.7 10*3/uL — ABNORMAL LOW (ref 1.0–3.6)
MCH: 31.4 pg (ref 26.0–34.0)
MCHC: 34.5 g/dL (ref 32.0–36.0)
MCV: 91.2 fL (ref 80.0–100.0)
Monocytes Absolute: 0.6 10*3/uL (ref 0.2–1.0)
Monocytes Relative: 14 %
Neutro Abs: 2.3 10*3/uL (ref 1.4–6.5)
Neutrophils Relative %: 55 %
Platelets: 170 10*3/uL (ref 150–440)
RBC: 4.08 MIL/uL — ABNORMAL LOW (ref 4.40–5.90)
RDW: 13.7 % (ref 11.5–14.5)
WBC: 4.2 10*3/uL (ref 3.8–10.6)

## 2015-10-02 LAB — MAGNESIUM: Magnesium: 2.1 mg/dL (ref 1.7–2.4)

## 2015-10-02 NOTE — Progress Notes (Signed)
Pt drinks about 3 glasses of wine a day still.  BP lower today concerned about the lowness.  Redness on left cheek, no itching, no pain.

## 2015-10-02 NOTE — Progress Notes (Signed)
[facility] day: 10/02/2015   Chief Complaint: Batu Cassin. is a 82 y.o. male with rectal cancer who is seen for weekly assessment during neoadjuvant concurrent Xeloda and radiation.  HPI:  The patient was last seen by me in the medical oncology clinic on 09/25/2015.  At that time, he had some mild rectal tenderness.  He denied rectal bleeding.  He continued daily Xeloda with radiation.  I encouraged him to decrease his alcohol intake.  During the interim, he has continued radiation and Xeloda.  He notes that his bowel movements are less painful. He has 2-3 soft bowel movements a day. He denies any melena or hematochezia. He denies any hand-foot syndrome.  He continues to drink 3 small glasses of wine a day.  He starts drinking at 3 PM.   Past Medical History  Diagnosis Date  . Hypertension   . Glaucoma   . Colon polyp   . Allergic rhinitis   . Substance abuse     alcohol  . Hyperlipidemia   . Anemia   . Dementia     memory issues  . GERD (gastroesophageal reflux disease)   . Fatty liver   . B12 deficiency 06/29/2015  . Benign neoplasm of ascending colon   . Benign neoplasm of descending colon   . DD (diverticular disease) 06/29/2015  . Rectal adenocarcinoma ([place]) 07/15/2015  . Traumatic intraparenchymal hemorrhage [facility])     Past Surgical History  Procedure Laterality Date  . Colonoscopy with propofol N/A 07/10/2015    Procedure: COLONOSCOPY WITH PROPOFOL;  Surgeon: Lucilla Lame, MD;  Location: [place];  Service: Endoscopy;  Laterality: N/A;  PER CAREGIVER WAS TOLD THAT PT WOULD BE 1ST (KEEP PT EARLY AM)  . Polypectomy  07/10/2015    Procedure: POLYPECTOMY;  Surgeon: Lucilla Lame, MD;  Location: [place];  Service: Endoscopy;;  . Cataract extraction w/ intraocular lens implant      Family History  Problem Relation Age of Onset  . Cancer Father     Prostate    Social History:  reports that he quit  smoking about 37 years ago. His smoking use included Pipe. He has never used smokeless tobacco. He reports that he drinks about 16.8 oz of alcohol per week. He reports that he does not use illicit drugs.  He drinks a bottle of wine a day.  The patient is widowed.  He has no children.  The patient is accompanied by his caregiver, [name], today.  Allergies: No Known Allergies  Current Medications: Current Outpatient Prescriptions  Medication Sig Dispense Refill  . brimonidine-timolol (COMBIGAN) 0.2-0.5 % ophthalmic solution Place 1 drop into both eyes 2 (two) times daily.    . capecitabine (XELODA) 150 MG tablet Take two 150 mg tablets (total 300 mg) twice a day Monday-Friday during radiation 100 tablet 0  . capecitabine (XELODA) 500 MG tablet Take two 500 mg tablets (1000 mg total) twice a day on Monday - Friday during radiation 100 tablet 0  . cetirizine (ZYRTEC) 10 MG tablet Take 10 mg by mouth daily.     . cholecalciferol (VITAMIN D) 1000 units tablet Take 1,000 Units by mouth daily.    Marland Kitchen donepezil (ARICEPT) 10 MG tablet Take 10 mg by mouth at bedtime.    . fluticasone (FLONASE) 50 MCG/ACT nasal spray Place 2 sprays into both nostrils daily as needed for rhinitis.     . folic acid (FOLVITE) 1 MG tablet Take  1 tablet (1 mg total) by mouth daily. 30 tablet 2  . gabapentin (NEURONTIN) 100 MG capsule     . metoprolol succinate (TOPROL XL) 25 MG 24 hr tablet Take 1 tablet (25 mg total) by mouth daily. 30 tablet 0  . montelukast (SINGULAIR) 10 MG tablet Take 10 mg by mouth at bedtime. Reported on 08/21/2015    . mupirocin ointment (BACTROBAN) 2 % Apply 1 application topically daily as needed.   0  . omeprazole (PRILOSEC) 20 MG capsule Take 1 capsule (20 mg total) by mouth daily. 30 capsule 0  . ondansetron (ZOFRAN) 8 MG tablet Take 1 tablet (8 mg total) by mouth every 8 (eight) hours as needed for nausea or vomiting. 20 tablet 1  . thiamine 100 MG tablet Take 1 tablet (100 mg total) by mouth  daily. 30 tablet 2  . valsartan (DIOVAN) 80 MG tablet Take 80 mg by mouth daily.    . vitamin B-12 (CYANOCOBALAMIN) 1000 MCG tablet Take 1,000 mcg by mouth daily.    . hydrocortisone (ANUSOL-HC) 25 MG suppository Place 1 suppository (25 mg total) rectally 2 (two) times daily. (Patient not taking: Reported on 10/02/2015) 12 suppository 0   No current facility-administered medications for this visit.    Review of Systems:  GENERAL:  Feels "good".  No fevers, sweats or weight loss. PERFORMANCE STATUS (ECOG):  2 HEENT:  Allergies.  No visual changes, sore throat, mouth sores or tenderness. Lungs: No shortness of breath or cough.  No hemoptysis. Cardiac:  No chest pain, palpitations, orthopnea, or PND. GI:  2-3 soft bowel movements/day.  No rectal bleeding.  No nausea, vomiting or constipation. GU:  No urgency, frequency, dysuria, or hematuria. Musculoskeletal:  Back pain secondary to T12 compression fracture.  Left shoulder sore s/p fracture.  No muscle tenderness. Extremities:  No pain or swelling. Skin:  No rashes or skin changes. Neuro:  Mild dementia.  Balance and coordination off (chronic).  No headache, numbness or weakness. Endocrine:  No diabetes, thyroid issues, hot flashes or night sweats. Psych:  No mood changes, depression or anxiety. Pain:  Back pain, managed by medications. Review of systems:  All other systems reviewed and found to be negative.  Physical Exam: Blood pressure 95/60, pulse 61, temperature 95.8 F (35.4 C), temperature source Tympanic, resp. rate 18, height '5\' 5"'$  (1.651 m), weight 130 lb 10 oz (59.25 kg). GENERAL:  Well developed, well nourished, elderly gentleman sitting comfortably in the exam room in no acute distress.  He has a rolling walker at his side. MENTAL STATUS:  Alert and oriented to person, place and time. HEAD: Pearline Cables hair.  Normocephalic, atraumatic, face symmetric, no Cushingoid features. EYES:  Glasses.  Blue eyes.  Pupils equal round and  reactive to light and accomodation.  No conjunctivitis or scleral icterus. ENT:  Oropharynx clear without lesion.  Tongue normal.  Mucous membranes moist.  RESPIRATORY:  Clear to auscultation without rales, wheezes or rhonchi. CARDIOVASCULAR:  Regular rate and rhythm without murmur, rub or gallop. ABDOMEN:  Soft, non-tender, with active bowel sounds, and no hepatosplenomegaly.  No masses. RECTAL:  No perirectal breakdown or erythema.  Mild hyperpigmentation. Non inflamed hemorrhoid. SKIN:  Few small ecchymosis on arms.  No palmar erythema or desquamation.  No rashes, ulcers or lesions. EXTREMITIES: No edema, no skin discoloration or tenderness.  No palpable cords. LYMPH NODES: No palpable cervical, supraclavicular, axillary or inguinal adenopathy  NEUROLOGICAL: Unremarkable. PSYCH:  Appropriate.   Appointment on 10/02/2015  Component Date Value  Ref Range Status  . WBC 10/02/2015 4.2  3.8 - 10.6 K/uL Final  . RBC 10/02/2015 4.08* 4.40 - 5.90 MIL/uL Final  . Hemoglobin 10/02/2015 12.8* 13.0 - 18.0 g/dL Final  . HCT 10/02/2015 37.2* 40.0 - 52.0 % Final  . MCV 10/02/2015 91.2  80.0 - 100.0 fL Final  . MCH 10/02/2015 31.4  26.0 - 34.0 pg Final  . MCHC 10/02/2015 34.5  32.0 - 36.0 g/dL Final  . RDW 10/02/2015 13.7  11.5 - 14.5 % Final  . Platelets 10/02/2015 170  150 - 440 K/uL Final  . Neutrophils Relative % 10/02/2015 55   Final  . Neutro Abs 10/02/2015 2.3  1.4 - 6.5 K/uL Final  . Lymphocytes Relative 10/02/2015 18   Final  . Lymphs Abs 10/02/2015 0.7* 1.0 - 3.6 K/uL Final  . Monocytes Relative 10/02/2015 14   Final  . Monocytes Absolute 10/02/2015 0.6  0.2 - 1.0 K/uL Final  . Eosinophils Relative 10/02/2015 12   Final  . Eosinophils Absolute 10/02/2015 0.5  0 - 0.7 K/uL Final  . Basophils Relative 10/02/2015 1   Final  . Basophils Absolute 10/02/2015 0.0  0 - 0.1 K/uL Final  . Sodium 10/02/2015 132* 135 - 145 mmol/L Final  . Potassium 10/02/2015 4.0  3.5 - 5.1 mmol/L Final  .  Chloride 10/02/2015 100* 101 - 111 mmol/L Final  . CO2 10/02/2015 27  22 - 32 mmol/L Final  . Glucose, Bld 10/02/2015 121* 65 - 99 mg/dL Final  . BUN 10/02/2015 10  6 - 20 mg/dL Final  . Creatinine, Ser 10/02/2015 0.69  0.61 - 1.24 mg/dL Final  . Calcium 10/02/2015 9.8  8.9 - 10.3 mg/dL Final  . Total Protein 10/02/2015 7.0  6.5 - 8.1 g/dL Final  . Albumin 10/02/2015 3.8  3.5 - 5.0 g/dL Final  . AST 10/02/2015 40  15 - 41 U/L Final  . ALT 10/02/2015 30  17 - 63 U/L Final  . Alkaline Phosphatase 10/02/2015 55  38 - 126 U/L Final  . Total Bilirubin 10/02/2015 0.9  0.3 - 1.2 mg/dL Final  . GFR calc non Af Amer 10/02/2015 >60  >60 mL/min Final  . GFR calc Af Amer 10/02/2015 >60  >60 mL/min Final   Comment: (NOTE) The eGFR has been calculated using the CKD EPI equation. This calculation has not been validated in all clinical situations. eGFR's persistently <60 mL/min signify possible Chronic Kidney Disease.   . Anion gap 10/02/2015 5  5 - 15 Final  . Magnesium 10/02/2015 2.1  1.7 - 2.4 mg/dL Final    Assessment:  Jaishawn Witzke. is a 82 y.o. male with a T3uN0 low rectal adenocarcinoma.  He presented with rectal bleeding.  Colonoscopy on 07/10/2015 revealed a 4 cm frond-like/villous ulcerated non-obstructing mass in the rectum.  Biopsy revealed adenocarcinoma.  Chest, abdomen, and pelvic CT scan on 07/27/2015 revealed a circumferential soft tissue mass within the low rectum. He was a T12 compression fracture.  CEA was 4.1 on 07/26/2015.  Flexible sigmoidoscopy and ultrasound on 08/19/2015 revealed a 3.0 x 1.4 cm hypoechoic non-circumferential mass was found in the rectum.  There was no sonographic evidence suggesting breakthrough of the muscularis propria with invasion into the perirectal fat.  No lymph nodes were seen in the perirectal region and in the left iliac region. Stage was early T3uN0.  He was admitted to [facility] from 07/24/2015 - 07/30/2015 with acute right basal ganglia  hemorrhage and left humeral head fracture s/p  a fall on 07/21/2015.  He has 24 hour home care.  He is fairly independent with a walker.    He was admitted to [facility] in [place] from 08/08/2015 - 08/09/2015 with acute diverticulitis.  He was discharged home on Augmentin rather than Flagyl given his alcohol dependency.  He drinks alcohol daily (3 glasses of wine).  He has mild dementia.   He is status post 3 weeks of concurrent Xeloda and radiation (began 09/09/2015).  He has 2-3 soft bowel movements/day.  He has no mouth sores or hand foot syndrome.  Symptomatically, he feels good.  He denies rectal bleeding.  His blood pressure is a little lower than baseline.  Plan: 1.  Labs today:  CBC with diff, CMP. 2.  Check orthostatics.  Patient not orthostatic. 3.  Contact Dr. Netty Starring [phone]) re: blood pressure and adjustment of medications. 4.  Continue Xeloda 1300 mg BID on days of radiation (Monday-Friday) only.   5.  Encourage increased fluid intake. 6.  Encourage decreased alcohol intake. 7.  RTC in 1 week for MD assessment and labs (CBC with diff, CMP, Mg).   Lequita Asal, MD  10/02/2015, 10:07 AM

## 2015-10-05 ENCOUNTER — Ambulatory Visit
Admission: RE | Admit: 2015-10-05 | Discharge: 2015-10-05 | Disposition: A | Discharge status: Home / Self Care | Payer: Medicare Other | Source: Ambulatory Visit | Attending: Radiation Oncology | Admitting: Radiation Oncology

## 2015-10-05 DIAGNOSIS — C20 Malignant neoplasm of rectum: Secondary | ICD-10-CM | POA: Diagnosis not present

## 2015-10-06 ENCOUNTER — Ambulatory Visit
Admission: RE | Admit: 2015-10-06 | Discharge: 2015-10-06 | Disposition: A | Discharge status: Home / Self Care | Payer: Medicare Other | Source: Ambulatory Visit | Attending: Radiation Oncology | Admitting: Radiation Oncology

## 2015-10-06 DIAGNOSIS — C20 Malignant neoplasm of rectum: Secondary | ICD-10-CM | POA: Diagnosis not present

## 2015-10-07 ENCOUNTER — Ambulatory Visit
Admission: RE | Admit: 2015-10-07 | Discharge: 2015-10-07 | Disposition: A | Discharge status: Home / Self Care | Payer: Medicare Other | Source: Ambulatory Visit | Attending: Radiation Oncology | Admitting: Radiation Oncology

## 2015-10-07 DIAGNOSIS — C20 Malignant neoplasm of rectum: Secondary | ICD-10-CM | POA: Diagnosis not present

## 2015-10-08 ENCOUNTER — Ambulatory Visit
Admission: RE | Admit: 2015-10-08 | Discharge: 2015-10-08 | Disposition: A | Discharge status: Home / Self Care | Payer: Medicare Other | Source: Ambulatory Visit | Attending: Radiation Oncology | Admitting: Radiation Oncology

## 2015-10-08 DIAGNOSIS — C20 Malignant neoplasm of rectum: Secondary | ICD-10-CM | POA: Diagnosis not present

## 2015-10-09 ENCOUNTER — Inpatient Hospital Stay: Payer: Medicare Other

## 2015-10-09 ENCOUNTER — Encounter: Payer: Self-pay | Admitting: Hematology and Oncology

## 2015-10-09 ENCOUNTER — Inpatient Hospital Stay (HOSPITAL_BASED_OUTPATIENT_CLINIC_OR_DEPARTMENT_OTHER): Payer: Medicare Other | Admitting: Hematology and Oncology

## 2015-10-09 ENCOUNTER — Ambulatory Visit: Payer: Medicare Other

## 2015-10-09 ENCOUNTER — Ambulatory Visit
Admission: RE | Admit: 2015-10-09 | Discharge: 2015-10-09 | Disposition: A | Discharge status: Home / Self Care | Payer: Medicare Other | Source: Ambulatory Visit | Attending: Radiation Oncology | Admitting: Radiation Oncology

## 2015-10-09 VITALS — BP 81/44 | HR 54 | Temp 95.1°F | Resp 18 | Wt 129.4 lb

## 2015-10-09 DIAGNOSIS — Z9221 Personal history of antineoplastic chemotherapy: Secondary | ICD-10-CM | POA: Diagnosis not present

## 2015-10-09 DIAGNOSIS — C20 Malignant neoplasm of rectum: Secondary | ICD-10-CM

## 2015-10-09 DIAGNOSIS — E871 Hypo-osmolality and hyponatremia: Secondary | ICD-10-CM

## 2015-10-09 DIAGNOSIS — Z923 Personal history of irradiation: Secondary | ICD-10-CM | POA: Diagnosis not present

## 2015-10-09 DIAGNOSIS — M549 Dorsalgia, unspecified: Secondary | ICD-10-CM

## 2015-10-09 DIAGNOSIS — E538 Deficiency of other specified B group vitamins: Secondary | ICD-10-CM

## 2015-10-09 DIAGNOSIS — Z87891 Personal history of nicotine dependence: Secondary | ICD-10-CM

## 2015-10-09 DIAGNOSIS — Z79899 Other long term (current) drug therapy: Secondary | ICD-10-CM

## 2015-10-09 DIAGNOSIS — R21 Rash and other nonspecific skin eruption: Secondary | ICD-10-CM

## 2015-10-09 DIAGNOSIS — Z87311 Personal history of (healed) other pathological fracture: Secondary | ICD-10-CM

## 2015-10-09 LAB — COMPREHENSIVE METABOLIC PANEL
ALT: 24 U/L (ref 17–63)
AST: 36 U/L (ref 15–41)
Albumin: 3.6 g/dL (ref 3.5–5.0)
Alkaline Phosphatase: 48 U/L (ref 38–126)
Anion gap: 6 (ref 5–15)
BUN: 13 mg/dL (ref 6–20)
CO2: 26 mmol/L (ref 22–32)
Calcium: 9.2 mg/dL (ref 8.9–10.3)
Chloride: 97 mmol/L — ABNORMAL LOW (ref 101–111)
Creatinine, Ser: 0.66 mg/dL (ref 0.61–1.24)
GFR calc Af Amer: >60 mL/min (ref >60)
GFR calc non Af Amer: >60 mL/min (ref >60)
Glucose, Bld: 125 mg/dL — ABNORMAL HIGH (ref 65–99)
Potassium: 3.9 mmol/L (ref 3.5–5.1)
Sodium: 129 mmol/L — ABNORMAL LOW (ref 135–145)
Total Bilirubin: 0.9 mg/dL (ref 0.3–1.2)
Total Protein: 6.4 g/dL — ABNORMAL LOW (ref 6.5–8.1)

## 2015-10-09 LAB — CBC WITH DIFFERENTIAL/PLATELET
Basophils Absolute: 0 10*3/uL (ref 0–0.1)
Basophils Relative: 1 %
Eosinophils Absolute: 1 10*3/uL — ABNORMAL HIGH (ref 0–0.7)
Eosinophils Relative: 20 %
HCT: 34.6 % — ABNORMAL LOW (ref 40.0–52.0)
Hemoglobin: 12 g/dL — ABNORMAL LOW (ref 13.0–18.0)
Lymphocytes Relative: 12 %
Lymphs Abs: 0.6 10*3/uL — ABNORMAL LOW (ref 1.0–3.6)
MCH: 32 pg (ref 26.0–34.0)
MCHC: 34.7 g/dL (ref 32.0–36.0)
MCV: 92.1 fL (ref 80.0–100.0)
Monocytes Absolute: 0.7 10*3/uL (ref 0.2–1.0)
Monocytes Relative: 13 %
Neutro Abs: 2.7 10*3/uL (ref 1.4–6.5)
Neutrophils Relative %: 54 %
Platelets: 210 10*3/uL (ref 150–440)
RBC: 3.76 MIL/uL — ABNORMAL LOW (ref 4.40–5.90)
RDW: 14 % (ref 11.5–14.5)
WBC: 5.1 10*3/uL (ref 3.8–10.6)

## 2015-10-09 LAB — MAGNESIUM: Magnesium: 2 mg/dL (ref 1.7–2.4)

## 2015-10-09 NOTE — Progress Notes (Signed)
[facility] day: 10/09/2015   Chief Complaint: Kenneth Stencel. is a 82 y.o. male with rectal cancer who is seen for weekly assessment during neoadjuvant concurrent Xeloda and radiation.  HPI:  The patient was last seen by me in the medical oncology clinic on 10/02/2015.  At that time, he was doing well.  He was tolerating his daily Xeloda with radiation.  His blood pressure was slightly lower than baseline.  He was encouraged to push fluids.  Dr. Netty Starring was contacted regarding adjustment of his blood pressure medications.  During the interim, he has felt "pretty good".  He notes a facial and hand rash.  Rash in not pruritic.  He denies any new soaps, lotions, medications or food.  He continues to sip 3 glasses of wine a day beginning at 3 PM.  He has 2-3 soft bowel movements a day.    He continues daily Xeloda and radiation.  Radiation should complete on 10/22/2015.   Past Medical History  Diagnosis Date  . Hypertension   . Glaucoma   . Colon polyp   . Allergic rhinitis   . Substance abuse     alcohol  . Hyperlipidemia   . Anemia   . Dementia     memory issues  . GERD (gastroesophageal reflux disease)   . Fatty liver   . B12 deficiency 06/29/2015  . Benign neoplasm of ascending colon   . Benign neoplasm of descending colon   . DD (diverticular disease) 06/29/2015  . Rectal adenocarcinoma ([place]) 07/15/2015  . Traumatic intraparenchymal hemorrhage [facility])     Past Surgical History  Procedure Laterality Date  . Colonoscopy with propofol N/A 07/10/2015    Procedure: COLONOSCOPY WITH PROPOFOL;  Surgeon: Lucilla Lame, MD;  Location: [place];  Service: Endoscopy;  Laterality: N/A;  PER CAREGIVER WAS TOLD THAT PT WOULD BE 1ST (KEEP PT EARLY AM)  . Polypectomy  07/10/2015    Procedure: POLYPECTOMY;  Surgeon: Lucilla Lame, MD;  Location: [place];  Service: Endoscopy;;  . Cataract extraction w/ intraocular lens  implant      Family History  Problem Relation Age of Onset  . Cancer Father     Prostate    Social History:  reports that he quit smoking about 37 years ago. His smoking use included Pipe. He has never used smokeless tobacco. He reports that he drinks about 16.8 oz of alcohol per week. He reports that he does not use illicit drugs.  He drinks a bottle of wine a day.  The patient is widowed.  He has no children.  The patient is accompanied by his caregiver, [name], today.  Allergies: No Known Allergies  Current Medications: Current Outpatient Prescriptions  Medication Sig Dispense Refill  . brimonidine-timolol (COMBIGAN) 0.2-0.5 % ophthalmic solution Place 1 drop into both eyes 2 (two) times daily.    . capecitabine (XELODA) 150 MG tablet Take two 150 mg tablets (total 300 mg) twice a day Monday-Friday during radiation 100 tablet 0  . capecitabine (XELODA) 500 MG tablet Take two 500 mg tablets (1000 mg total) twice a day on Monday - Friday during radiation 100 tablet 0  . cetirizine (ZYRTEC) 10 MG tablet Take 10 mg by mouth daily.     . cholecalciferol (VITAMIN D) 1000 units tablet Take 1,000 Units by mouth daily.    Marland Kitchen donepezil (ARICEPT) 10 MG tablet Take 10 mg by mouth at bedtime.    . fluticasone (FLONASE)  50 MCG/ACT nasal spray Place 2 sprays into both nostrils daily as needed for rhinitis.     . folic acid (FOLVITE) 1 MG tablet Take 1 tablet (1 mg total) by mouth daily. 30 tablet 2  . gabapentin (NEURONTIN) 100 MG capsule     . hydrocortisone (ANUSOL-HC) 25 MG suppository Place 1 suppository (25 mg total) rectally 2 (two) times daily. 12 suppository 0  . metoprolol succinate (TOPROL XL) 25 MG 24 hr tablet Take 1 tablet (25 mg total) by mouth daily. 30 tablet 0  . montelukast (SINGULAIR) 10 MG tablet Take 10 mg by mouth at bedtime. Reported on 08/21/2015    . mupirocin ointment (BACTROBAN) 2 % Apply 1 application topically daily as needed.   0  . omeprazole (PRILOSEC) 20 MG  capsule Take 1 capsule (20 mg total) by mouth daily. 30 capsule 0  . ondansetron (ZOFRAN) 8 MG tablet Take 1 tablet (8 mg total) by mouth every 8 (eight) hours as needed for nausea or vomiting. 20 tablet 1  . thiamine 100 MG tablet Take 1 tablet (100 mg total) by mouth daily. 30 tablet 2  . valsartan (DIOVAN) 80 MG tablet Take 80 mg by mouth daily.    . vitamin B-12 (CYANOCOBALAMIN) 1000 MCG tablet Take 1,000 mcg by mouth daily.     No current facility-administered medications for this visit.    Review of Systems:  GENERAL:  Feels "pretty good".  No fevers or sweats. Weight down 1 pound. PERFORMANCE STATUS (ECOG):  2 HEENT:  Allergies.  No visual changes, sore throat, mouth sores or tenderness. Lungs: No shortness of breath or cough.  No hemoptysis. Cardiac:  No chest pain, palpitations, orthopnea, or PND. GI:  2-3 soft bowel movements/day.  No rectal bleeding.  One episode of diarrhea.  No nausea, vomiting or constipation. GU:  No urgency, frequency, dysuria, or hematuria. Musculoskeletal:  Back pain secondary to T12 compression fracture.  Left shoulder sore s/p fracture.  No muscle tenderness. Extremities:  No pain or swelling. Skin:  Facial and hand rash.  Neuro:  Mild dementia.  Balance and coordination off (chronic).  No headache, numbness or weakness. Endocrine:  No diabetes, thyroid issues, hot flashes or night sweats. Psych:  No mood changes, depression or anxiety. Pain:  Back pain, managed by medications. Review of systems:  All other systems reviewed and found to be negative.  Physical Exam: Blood pressure 81/44, pulse 54, temperature 95.1 F (35.1 C), temperature source Tympanic, resp. rate 18, weight 129 lb 6.6 oz (58.7 kg). GENERAL:  Well developed, well nourished, elderly gentleman sitting comfortably in the exam room in no acute distress.  He has a rolling walker at his side. MENTAL STATUS:  Alert and oriented to person, place and time. HEAD: Pearline Cables hair.  Normocephalic,  atraumatic, face symmetric, no Cushingoid features. EYES:  Glasses.  Blue eyes.  Pupils equal round and reactive to light and accomodation.  No conjunctivitis or scleral icterus. ENT:  Oropharynx clear without lesion.  Tongue normal.  Mucous membranes moist.  RESPIRATORY:  Clear to auscultation without rales, wheezes or rhonchi. CARDIOVASCULAR:  Regular rate and rhythm without murmur, rub or gallop. ABDOMEN:  Soft, non-tender, with active bowel sounds, and no hepatosplenomegaly.  No masses. RECTAL:  No perirectal breakdown or erythema.  Increased hyperpigmentation. Non inflamed hemorrhoid. SKIN:  Mild macular patchy facial and dorsum of hand rash.  Few small ecchymosis on arms.  No palmar erythema or desquamation.  No rashes, ulcers or lesions. EXTREMITIES: No edema,  no skin discoloration or tenderness.  No palpable cords. LYMPH NODES: No palpable cervical, supraclavicular, axillary or inguinal adenopathy  NEUROLOGICAL: Unremarkable. PSYCH:  Appropriate.   Appointment on 10/09/2015  Component Date Value Ref Range Status  . WBC 10/09/2015 5.1  3.8 - 10.6 K/uL Final  . RBC 10/09/2015 3.76* 4.40 - 5.90 MIL/uL Final  . Hemoglobin 10/09/2015 12.0* 13.0 - 18.0 g/dL Final  . HCT 10/09/2015 34.6* 40.0 - 52.0 % Final  . MCV 10/09/2015 92.1  80.0 - 100.0 fL Final  . MCH 10/09/2015 32.0  26.0 - 34.0 pg Final  . MCHC 10/09/2015 34.7  32.0 - 36.0 g/dL Final  . RDW 10/09/2015 14.0  11.5 - 14.5 % Final  . Platelets 10/09/2015 210  150 - 440 K/uL Final  . Neutrophils Relative % 10/09/2015 54   Final  . Neutro Abs 10/09/2015 2.7  1.4 - 6.5 K/uL Final  . Lymphocytes Relative 10/09/2015 12   Final  . Lymphs Abs 10/09/2015 0.6* 1.0 - 3.6 K/uL Final  . Monocytes Relative 10/09/2015 13   Final  . Monocytes Absolute 10/09/2015 0.7  0.2 - 1.0 K/uL Final  . Eosinophils Relative 10/09/2015 20   Final  . Eosinophils Absolute 10/09/2015 1.0* 0 - 0.7 K/uL Final  . Basophils Relative 10/09/2015 1   Final  .  Basophils Absolute 10/09/2015 0.0  0 - 0.1 K/uL Final  . Sodium 10/09/2015 129* 135 - 145 mmol/L Final  . Potassium 10/09/2015 3.9  3.5 - 5.1 mmol/L Final  . Chloride 10/09/2015 97* 101 - 111 mmol/L Final  . CO2 10/09/2015 26  22 - 32 mmol/L Final  . Glucose, Bld 10/09/2015 125* 65 - 99 mg/dL Final  . BUN 10/09/2015 13  6 - 20 mg/dL Final  . Creatinine, Ser 10/09/2015 0.66  0.61 - 1.24 mg/dL Final  . Calcium 10/09/2015 9.2  8.9 - 10.3 mg/dL Final  . Total Protein 10/09/2015 6.4* 6.5 - 8.1 g/dL Final  . Albumin 10/09/2015 3.6  3.5 - 5.0 g/dL Final  . AST 10/09/2015 36  15 - 41 U/L Final  . ALT 10/09/2015 24  17 - 63 U/L Final  . Alkaline Phosphatase 10/09/2015 48  38 - 126 U/L Final  . Total Bilirubin 10/09/2015 0.9  0.3 - 1.2 mg/dL Final  . GFR calc non Af Amer 10/09/2015 >60  >60 mL/min Final  . GFR calc Af Amer 10/09/2015 >60  >60 mL/min Final   Comment: (NOTE) The eGFR has been calculated using the CKD EPI equation. This calculation has not been validated in all clinical situations. eGFR's persistently <60 mL/min signify possible Chronic Kidney Disease.   . Anion gap 10/09/2015 6  5 - 15 Final  . Magnesium 10/09/2015 2.0  1.7 - 2.4 mg/dL Final    Assessment:  Kenneth Luna. is a 82 y.o. male with a T3uN0 low rectal adenocarcinoma.  He presented with rectal bleeding.  Colonoscopy on 07/10/2015 revealed a 4 cm frond-like/villous ulcerated non-obstructing mass in the rectum.  Biopsy revealed adenocarcinoma.  Chest, abdomen, and pelvic CT scan on 07/27/2015 revealed a circumferential soft tissue mass within the low rectum. He was a T12 compression fracture.  CEA was 4.1 on 07/26/2015.  Flexible sigmoidoscopy and ultrasound on 08/19/2015 revealed a 3.0 x 1.4 cm hypoechoic non-circumferential mass was found in the rectum.  There was no sonographic evidence suggesting breakthrough of the muscularis propria with invasion into the perirectal fat.  No lymph nodes were seen in the  perirectal region  and in the left iliac region. Stage was early T3uN0.  He was admitted to [facility] from 07/24/2015 - 07/30/2015 with acute right basal ganglia hemorrhage and left humeral head fracture s/p a fall on 07/21/2015.  He has 24 hour home care.  He is fairly independent with a walker.    He was admitted to [facility] in [place] from 08/08/2015 - 08/09/2015 with acute diverticulitis.  He was discharged home on Augmentin rather than Flagyl given his alcohol dependency.  He drinks alcohol daily (3 glasses of wine).  He has mild dementia.   He is status post 4 weeks of concurrent Xeloda and radiation (began 09/09/2015).  He has 3 soft bowel movements a day.  He has no mouth sores or hand foot syndrome.  Symptomatically, he denies any rectal pain or bleeding.  He has a mild patchy facial rash.  Rectal exam is unremarkable.  He has hyponatremia likely due to liver disease.  Plan: 1.  Labs today:  CBC with diff, CMP. 2.  Continue Xeloda 1300 mg BID on days of radiation (Monday-Friday) only.   3.  Discuss rash.  Likely a manifestation of his Xeloda +/- alcohol.  Continue to observe without intervention or dose adjustment. 4.  Encourage fluids with electrolytes, liberal use of salt, and decreased free water intake.   5.  Encourage continued decreased alcohol intake. 6.  RTC in 1 week for MD assessment and labs (CBC with diff, CMP, Mg).   Lequita Asal, MD  10/09/2015, 10:54 AM

## 2015-10-12 ENCOUNTER — Ambulatory Visit
Admission: RE | Admit: 2015-10-12 | Discharge: 2015-10-12 | Disposition: A | Discharge status: Home / Self Care | Payer: Medicare Other | Source: Ambulatory Visit | Attending: Radiation Oncology | Admitting: Radiation Oncology

## 2015-10-12 DIAGNOSIS — C20 Malignant neoplasm of rectum: Secondary | ICD-10-CM | POA: Diagnosis not present

## 2015-10-13 ENCOUNTER — Ambulatory Visit
Admission: RE | Admit: 2015-10-13 | Discharge: 2015-10-13 | Disposition: A | Discharge status: Home / Self Care | Payer: Medicare Other | Source: Ambulatory Visit | Attending: Radiation Oncology | Admitting: Radiation Oncology

## 2015-10-13 DIAGNOSIS — C20 Malignant neoplasm of rectum: Secondary | ICD-10-CM | POA: Diagnosis not present

## 2015-10-14 ENCOUNTER — Ambulatory Visit
Admission: RE | Admit: 2015-10-14 | Discharge: 2015-10-14 | Disposition: A | Discharge status: Home / Self Care | Payer: Medicare Other | Source: Ambulatory Visit | Attending: Radiation Oncology | Admitting: Radiation Oncology

## 2015-10-14 DIAGNOSIS — C20 Malignant neoplasm of rectum: Secondary | ICD-10-CM | POA: Diagnosis not present

## 2015-10-15 ENCOUNTER — Ambulatory Visit: Payer: Medicare Other

## 2015-10-15 ENCOUNTER — Ambulatory Visit
Admission: RE | Admit: 2015-10-15 | Discharge: 2015-10-15 | Disposition: A | Discharge status: Home / Self Care | Payer: Medicare Other | Source: Ambulatory Visit | Attending: Radiation Oncology | Admitting: Radiation Oncology

## 2015-10-15 DIAGNOSIS — C20 Malignant neoplasm of rectum: Secondary | ICD-10-CM | POA: Diagnosis not present

## 2015-10-16 ENCOUNTER — Inpatient Hospital Stay: Payer: Medicare Other

## 2015-10-16 ENCOUNTER — Ambulatory Visit
Admission: RE | Admit: 2015-10-16 | Discharge: 2015-10-16 | Disposition: A | Discharge status: Home / Self Care | Payer: Medicare Other | Source: Ambulatory Visit | Attending: Radiation Oncology | Admitting: Radiation Oncology

## 2015-10-16 ENCOUNTER — Inpatient Hospital Stay (HOSPITAL_BASED_OUTPATIENT_CLINIC_OR_DEPARTMENT_OTHER): Payer: Medicare Other | Admitting: Hematology and Oncology

## 2015-10-16 ENCOUNTER — Ambulatory Visit: Payer: Medicare Other

## 2015-10-16 ENCOUNTER — Encounter: Payer: Self-pay | Admitting: Hematology and Oncology

## 2015-10-16 VITALS — BP 106/67 | HR 65 | Temp 97.4°F | Wt 130.1 lb

## 2015-10-16 DIAGNOSIS — E538 Deficiency of other specified B group vitamins: Secondary | ICD-10-CM

## 2015-10-16 DIAGNOSIS — E871 Hypo-osmolality and hyponatremia: Secondary | ICD-10-CM | POA: Diagnosis not present

## 2015-10-16 DIAGNOSIS — Z87891 Personal history of nicotine dependence: Secondary | ICD-10-CM

## 2015-10-16 DIAGNOSIS — F0390 Unspecified dementia without behavioral disturbance: Secondary | ICD-10-CM

## 2015-10-16 DIAGNOSIS — C20 Malignant neoplasm of rectum: Secondary | ICD-10-CM

## 2015-10-16 DIAGNOSIS — Z9221 Personal history of antineoplastic chemotherapy: Secondary | ICD-10-CM

## 2015-10-16 DIAGNOSIS — R21 Rash and other nonspecific skin eruption: Secondary | ICD-10-CM

## 2015-10-16 DIAGNOSIS — Z79899 Other long term (current) drug therapy: Secondary | ICD-10-CM

## 2015-10-16 DIAGNOSIS — M5489 Other dorsalgia: Secondary | ICD-10-CM

## 2015-10-16 DIAGNOSIS — Z923 Personal history of irradiation: Secondary | ICD-10-CM

## 2015-10-16 DIAGNOSIS — Z86010 Personal history of colonic polyps: Secondary | ICD-10-CM

## 2015-10-16 DIAGNOSIS — Z87311 Personal history of (healed) other pathological fracture: Secondary | ICD-10-CM

## 2015-10-16 LAB — CBC WITH DIFFERENTIAL/PLATELET
Basophils Absolute: 0 10*3/uL (ref 0–0.1)
Basophils Relative: 1 %
Eosinophils Absolute: 0.9 10*3/uL — ABNORMAL HIGH (ref 0–0.7)
Eosinophils Relative: 17 %
HCT: 35 % — ABNORMAL LOW (ref 40.0–52.0)
Hemoglobin: 12.2 g/dL — ABNORMAL LOW (ref 13.0–18.0)
Lymphocytes Relative: 9 %
Lymphs Abs: 0.5 10*3/uL — ABNORMAL LOW (ref 1.0–3.6)
MCH: 32.7 pg (ref 26.0–34.0)
MCHC: 34.9 g/dL (ref 32.0–36.0)
MCV: 93.7 fL (ref 80.0–100.0)
Monocytes Absolute: 0.6 10*3/uL (ref 0.2–1.0)
Monocytes Relative: 11 %
Neutro Abs: 3.3 10*3/uL (ref 1.4–6.5)
Neutrophils Relative %: 62 %
Platelets: 216 10*3/uL (ref 150–440)
RBC: 3.74 MIL/uL — ABNORMAL LOW (ref 4.40–5.90)
RDW: 22.5 % — ABNORMAL HIGH (ref 11.5–14.5)
WBC: 5.3 10*3/uL (ref 3.8–10.6)

## 2015-10-16 LAB — COMPREHENSIVE METABOLIC PANEL
ALT: 21 U/L (ref 17–63)
AST: 38 U/L (ref 15–41)
Albumin: 3.3 g/dL — ABNORMAL LOW (ref 3.5–5.0)
Alkaline Phosphatase: 44 U/L (ref 38–126)
Anion gap: 4 — ABNORMAL LOW (ref 5–15)
BUN: 11 mg/dL (ref 6–20)
CO2: 27 mmol/L (ref 22–32)
Calcium: 9.3 mg/dL (ref 8.9–10.3)
Chloride: 100 mmol/L — ABNORMAL LOW (ref 101–111)
Creatinine, Ser: 0.6 mg/dL — ABNORMAL LOW (ref 0.61–1.24)
GFR calc Af Amer: >60 mL/min (ref >60)
GFR calc non Af Amer: >60 mL/min (ref >60)
Glucose, Bld: 124 mg/dL — ABNORMAL HIGH (ref 65–99)
Potassium: 3.7 mmol/L (ref 3.5–5.1)
Sodium: 131 mmol/L — ABNORMAL LOW (ref 135–145)
Total Bilirubin: 0.9 mg/dL (ref 0.3–1.2)
Total Protein: 6 g/dL — ABNORMAL LOW (ref 6.5–8.1)

## 2015-10-16 LAB — MAGNESIUM: Magnesium: 2 mg/dL (ref 1.7–2.4)

## 2015-10-16 NOTE — Progress Notes (Signed)
[name] day: 10/16/2015   Chief Complaint: Kenneth Luna. is a 82 y.o. male with rectal cancer who is seen for weekly assessment during neoadjuvant concurrent Xeloda and radiation.  HPI:  The patient was last seen by me in the medical oncology clinic on 10/09/2015.  At that time, he was doing well.  He had a mild facial rash and hyponatremia.  We discussed drinking fluids with electrolytes, liberal use of salt, and decreased free water intake.  During the interim, he has continues to feel "pretty good".  He denies any diarrhea.  He has 3-4 soft bowel movements/day.  He rarely has rectal pain.  He denies any rectal bleeding.  He states that he was taken off his blood pressure medications.  He notes some fatigue associated with radiation.  He continues Xeloda BID with radiation. He continues to drink 3 glasses of wine a day.  Radiation should complete 10/22/2015.  He has a preop evaluation on 11/23/2015 and surgery scheduled for 12/15/2015.   Past Medical History  Diagnosis Date  . Hypertension   . Glaucoma   . Colon polyp   . Allergic rhinitis   . Substance abuse     alcohol  . Hyperlipidemia   . Anemia   . Dementia     memory issues  . GERD (gastroesophageal reflux disease)   . Fatty liver   . B12 deficiency 06/29/2015  . Benign neoplasm of ascending colon   . Benign neoplasm of descending colon   . DD (diverticular disease) 06/29/2015  . Rectal adenocarcinoma ([name] 07/15/2015  . Traumatic intraparenchymal hemorrhage [name]     Past Surgical History  Procedure Laterality Date  . Colonoscopy with propofol N/A 07/10/2015    Procedure: COLONOSCOPY WITH PROPOFOL;  Surgeon: Lucilla Lame, MD;  Location: [place];  Service: Endoscopy;  Laterality: N/A;  PER CAREGIVER WAS TOLD THAT PT WOULD BE 1ST (KEEP PT EARLY AM)  . Polypectomy  07/10/2015    Procedure: POLYPECTOMY;  Surgeon: Lucilla Lame, MD;  Location: [place];  Service: Endoscopy;;  . Cataract extraction w/ intraocular lens implant      Family History  Problem Relation Age of Onset  . Cancer Father     Prostate    Social History:  reports that he quit smoking about 37 years ago. His smoking use included Pipe. He has never used smokeless tobacco. He reports that he drinks about 16.8 oz of alcohol per week. He reports that he does not use illicit drugs.  He drinks 3 glasses of wine a day.  The patient is widowed.  He has no children.  The patient is accompanied by his caregiver, [name], today.  Allergies: No Known Allergies  Current Medications: Current Outpatient Prescriptions  Medication Sig Dispense Refill  . brimonidine-timolol (COMBIGAN) 0.2-0.5 % ophthalmic solution Place 1 drop into both eyes 2 (two) times daily.    . capecitabine (XELODA) 150 MG tablet Take two 150 mg tablets (total 300 mg) twice a day Monday-Friday during radiation 100 tablet 0  . capecitabine (XELODA) 500 MG tablet Take two 500 mg tablets (1000 mg total) twice a day on Monday - Friday during radiation 100 tablet 0  . cetirizine (ZYRTEC) 10 MG tablet Take 10 mg by mouth daily.     . cholecalciferol (VITAMIN D) 1000 units tablet Take 1,000 Units by mouth daily.    Marland Kitchen donepezil (ARICEPT) 10 MG tablet Take 10 mg by mouth at  bedtime.    . fluticasone (FLONASE) 50 MCG/ACT nasal spray Place 2 sprays into both nostrils daily as needed for rhinitis.     Marland Kitchen gabapentin (NEURONTIN) 100 MG capsule     . hydrocortisone (ANUSOL-HC) 25 MG suppository Place 1 suppository (25 mg total) rectally 2 (two) times daily. 12 suppository 0  . montelukast (SINGULAIR) 10 MG tablet Take 10 mg by mouth at bedtime. Reported on 08/21/2015    . mupirocin ointment (BACTROBAN) 2 % Apply 1 application topically daily as needed.   0  . omeprazole (PRILOSEC) 20 MG capsule Take 1 capsule (20 mg total) by mouth daily. 30 capsule 0  . ondansetron (ZOFRAN) 8 MG tablet Take 1 tablet (8 mg total) by  mouth every 8 (eight) hours as needed for nausea or vomiting. 20 tablet 1  . thiamine 100 MG tablet Take 1 tablet (100 mg total) by mouth daily. 30 tablet 2  . valsartan (DIOVAN) 80 MG tablet Take 80 mg by mouth daily.    . vitamin B-12 (CYANOCOBALAMIN) 1000 MCG tablet Take 1,000 mcg by mouth daily.    . folic acid (FOLVITE) 1 MG tablet Take 1 tablet (1 mg total) by mouth daily. 30 tablet 2  . metoprolol succinate (TOPROL XL) 25 MG 24 hr tablet Take 1 tablet (25 mg total) by mouth daily. (Patient not taking: Reported on 10/16/2015) 30 tablet 0   No current facility-administered medications for this visit.    Review of Systems:  GENERAL:  Feels "pretty good".  No fevers or sweats .  Weight up 1 pound. PERFORMANCE STATUS (ECOG):  2 HEENT:  Allergies.  No visual changes, sore throat, mouth sores or tenderness. Lungs: No shortness of breath or cough.  No hemoptysis. Cardiac:  No chest pain, palpitations, orthopnea, or PND. GI:  3-4 soft bowel movement/day.  No rectal bleeding.  Rare rectal discomfort.  No nausea, vomiting or constipation. GU:  No urgency, frequency, dysuria, or hematuria. Musculoskeletal:  Back pain secondary to T12 compression fracture.  Left shoulder sore s/p fracture.  No muscle tenderness. Extremities:  No pain or swelling. Skin:  Facial rash.  Neuro:  Mild dementia.  Balance and coordination off (chronic).  No headache, numbness or weakness. Endocrine:  No diabetes, thyroid issues, hot flashes or night sweats. Psych:  No mood changes, depression or anxiety. Pain:  Back pain, managed by medications. Review of systems:  All other systems reviewed and found to be negative.  Physical Exam: Blood pressure 106/67, pulse 65, temperature 97.4 F (36.3 C), temperature source Oral, weight 130 lb 1.1 oz (59 kg). GENERAL:  Well developed, well nourished, elderly gentleman sitting comfortably in the exam room in no acute distress.  He has a rolling walker at his side. MENTAL  STATUS:  Alert and oriented to person, place and time. HEAD: Pearline Cables hair.  Normocephalic, atraumatic, face symmetric, no Cushingoid features. EYES:  Glasses.  Blue eyes.  Pupils equal round and reactive to light and accomodation.  No conjunctivitis or scleral icterus. ENT:  Oropharynx clear without lesion.  Tongue normal.  Mucous membranes moist.  RESPIRATORY:  Clear to auscultation without rales, wheezes or rhonchi. CARDIOVASCULAR:  Regular rate and rhythm without murmur, rub or gallop. ABDOMEN:  Soft, non-tender, with active bowel sounds, and no hepatosplenomegaly.  No masses. RECTAL:  No perirectal breakdown.  Mild erythema.  Moderate hyperpigmentation. Non inflamed hemorrhoid. SKIN:  Slightly more prominent patchy macular facial rash.  No palmar erythema or desquamation.  EXTREMITIES: No edema, no skin discoloration  or tenderness.  No palpable cords. LYMPH NODES: No palpable cervical, supraclavicular, axillary or inguinal adenopathy  NEUROLOGICAL: Unremarkable. PSYCH:  Appropriate.   Appointment on 10/16/2015  Component Date Value Ref Range Status  . WBC 10/16/2015 5.3  3.8 - 10.6 K/uL Final  . RBC 10/16/2015 3.74* 4.40 - 5.90 MIL/uL Final  . Hemoglobin 10/16/2015 12.2* 13.0 - 18.0 g/dL Final  . HCT 10/16/2015 35.0* 40.0 - 52.0 % Final  . MCV 10/16/2015 93.7  80.0 - 100.0 fL Final  . MCH 10/16/2015 32.7  26.0 - 34.0 pg Final  . MCHC 10/16/2015 34.9  32.0 - 36.0 g/dL Final  . RDW 10/16/2015 22.5* 11.5 - 14.5 % Final  . Platelets 10/16/2015 216  150 - 440 K/uL Final  . Neutrophils Relative % 10/16/2015 62   Final  . Neutro Abs 10/16/2015 3.3  1.4 - 6.5 K/uL Final  . Lymphocytes Relative 10/16/2015 9   Final  . Lymphs Abs 10/16/2015 0.5* 1.0 - 3.6 K/uL Final  . Monocytes Relative 10/16/2015 11   Final  . Monocytes Absolute 10/16/2015 0.6  0.2 - 1.0 K/uL Final  . Eosinophils Relative 10/16/2015 17   Final  . Eosinophils Absolute 10/16/2015 0.9* 0 - 0.7 K/uL Final  . Basophils Relative  10/16/2015 1   Final  . Basophils Absolute 10/16/2015 0.0  0 - 0.1 K/uL Final  . Sodium 10/16/2015 131* 135 - 145 mmol/L Final  . Potassium 10/16/2015 3.7  3.5 - 5.1 mmol/L Final  . Chloride 10/16/2015 100* 101 - 111 mmol/L Final  . CO2 10/16/2015 27  22 - 32 mmol/L Final  . Glucose, Bld 10/16/2015 124* 65 - 99 mg/dL Final  . BUN 10/16/2015 11  6 - 20 mg/dL Final  . Creatinine, Ser 10/16/2015 0.60* 0.61 - 1.24 mg/dL Final  . Calcium 10/16/2015 9.3  8.9 - 10.3 mg/dL Final  . Total Protein 10/16/2015 6.0* 6.5 - 8.1 g/dL Final  . Albumin 10/16/2015 3.3* 3.5 - 5.0 g/dL Final  . AST 10/16/2015 38  15 - 41 U/L Final  . ALT 10/16/2015 21  17 - 63 U/L Final  . Alkaline Phosphatase 10/16/2015 44  38 - 126 U/L Final  . Total Bilirubin 10/16/2015 0.9  0.3 - 1.2 mg/dL Final  . GFR calc non Af Amer 10/16/2015 >60  >60 mL/min Final  . GFR calc Af Amer 10/16/2015 >60  >60 mL/min Final   Comment: (NOTE) The eGFR has been calculated using the CKD EPI equation. This calculation has not been validated in all clinical situations. eGFR's persistently <60 mL/min signify possible Chronic Kidney Disease.   . Anion gap 10/16/2015 4* 5 - 15 Final  . Magnesium 10/16/2015 2.0  1.7 - 2.4 mg/dL Final    Assessment:  Kenneth Luna. is a 82 y.o. male with a T3uN0 low rectal adenocarcinoma.  He presented with rectal bleeding.  Colonoscopy on 07/10/2015 revealed a 4 cm frond-like/villous ulcerated non-obstructing mass in the rectum.  Biopsy revealed adenocarcinoma.  Chest, abdomen, and pelvic CT scan on 07/27/2015 revealed a circumferential soft tissue mass within the low rectum. He was a T12 compression fracture.  CEA was 4.1 on 07/26/2015.  Flexible sigmoidoscopy and ultrasound on 08/19/2015 revealed a 3.0 x 1.4 cm hypoechoic non-circumferential mass was found in the rectum.  There was no sonographic evidence suggesting breakthrough of the muscularis propria with invasion into the perirectal fat.  No lymph  nodes were seen in the perirectal region and in the left iliac region. Stage  was early T3uN0.  He was admitted to [facility] from 07/24/2015 - 07/30/2015 with acute right basal ganglia hemorrhage and left humeral head fracture s/p a fall on 07/21/2015.  He has 24 hour home care.  He is fairly independent with a walker.    He was admitted to [facility] in [place] from 08/08/2015 - 08/09/2015 with acute diverticulitis.  He was discharged home on Augmentin rather than Flagyl given his alcohol dependency.  He drinks alcohol daily (3 glasses of wine).  He has mild dementia.   He is status post 5 weeks of concurrent Xeloda and radiation (began 09/09/2015).  Radiation should complete 10/22/2015.  He is scheduled for surgery on 12/15/2015.  Symptomatically, he has rare rectal discomfort.  He has 3-4 soft bowel movement/day.  He has no mouth sores or hand foot syndrome.  He has a stable facial rash and mild rectal erythema.  Hyponatremia is slightly better.  Plan: 1.  Labs today:  CBC with diff, CMP. 2.  Continue Xeloda 1300 mg BID on days of radiation (Monday-Friday) only.   3.  Discuss plans for completion of therapy next week.  Discuss plans for surgery then additional Xeloda post recovery from surgery. 4.  Discuss sodium and fluid/salt intake. 5.  Encourage decreased alcohol intake. 6.  RTC in 1 week for MD assessment and labs (CBC with diff, CMP, Mg).   Lequita Asal, MD  10/16/2015, 9:38 AM

## 2015-10-17 ENCOUNTER — Encounter: Payer: Self-pay | Admitting: Hematology and Oncology

## 2015-10-19 ENCOUNTER — Ambulatory Visit: Payer: Medicare Other

## 2015-10-19 ENCOUNTER — Ambulatory Visit
Admission: RE | Admit: 2015-10-19 | Discharge: 2015-10-19 | Disposition: A | Discharge status: Home / Self Care | Payer: Medicare Other | Source: Ambulatory Visit | Attending: Radiation Oncology | Admitting: Radiation Oncology

## 2015-10-19 DIAGNOSIS — C20 Malignant neoplasm of rectum: Secondary | ICD-10-CM | POA: Diagnosis not present

## 2015-10-20 ENCOUNTER — Ambulatory Visit: Payer: Medicare Other

## 2015-10-21 ENCOUNTER — Inpatient Hospital Stay (HOSPITAL_BASED_OUTPATIENT_CLINIC_OR_DEPARTMENT_OTHER): Payer: Medicare Other | Admitting: Hematology and Oncology

## 2015-10-21 ENCOUNTER — Ambulatory Visit: Payer: Medicare Other

## 2015-10-21 ENCOUNTER — Inpatient Hospital Stay: Payer: Medicare Other | Attending: Hematology and Oncology

## 2015-10-21 ENCOUNTER — Encounter: Payer: Self-pay | Admitting: Hematology and Oncology

## 2015-10-21 ENCOUNTER — Ambulatory Visit
Admission: RE | Admit: 2015-10-21 | Discharge: 2015-10-21 | Disposition: A | Discharge status: Home / Self Care | Payer: Medicare Other | Source: Ambulatory Visit | Attending: Radiation Oncology | Admitting: Radiation Oncology

## 2015-10-21 ENCOUNTER — Other Ambulatory Visit: Payer: Self-pay

## 2015-10-21 VITALS — BP 124/80 | HR 85 | Temp 96.6°F | Wt 130.8 lb

## 2015-10-21 DIAGNOSIS — Y842 Radiological procedure and radiotherapy as the cause of abnormal reaction of the patient, or of later complication, without mention of misadventure at the time of the procedure: Secondary | ICD-10-CM | POA: Insufficient documentation

## 2015-10-21 DIAGNOSIS — Z87891 Personal history of nicotine dependence: Secondary | ICD-10-CM

## 2015-10-21 DIAGNOSIS — M549 Dorsalgia, unspecified: Secondary | ICD-10-CM | POA: Diagnosis not present

## 2015-10-21 DIAGNOSIS — E871 Hypo-osmolality and hyponatremia: Secondary | ICD-10-CM

## 2015-10-21 DIAGNOSIS — E785 Hyperlipidemia, unspecified: Secondary | ICD-10-CM

## 2015-10-21 DIAGNOSIS — R21 Rash and other nonspecific skin eruption: Secondary | ICD-10-CM | POA: Insufficient documentation

## 2015-10-21 DIAGNOSIS — Z79899 Other long term (current) drug therapy: Secondary | ICD-10-CM

## 2015-10-21 DIAGNOSIS — Z87311 Personal history of (healed) other pathological fracture: Secondary | ICD-10-CM

## 2015-10-21 DIAGNOSIS — N3040 Irradiation cystitis without hematuria: Secondary | ICD-10-CM | POA: Insufficient documentation

## 2015-10-21 DIAGNOSIS — K700 Alcoholic fatty liver: Secondary | ICD-10-CM

## 2015-10-21 DIAGNOSIS — K219 Gastro-esophageal reflux disease without esophagitis: Secondary | ICD-10-CM

## 2015-10-21 DIAGNOSIS — C20 Malignant neoplasm of rectum: Secondary | ICD-10-CM

## 2015-10-21 DIAGNOSIS — F1020 Alcohol dependence, uncomplicated: Secondary | ICD-10-CM | POA: Diagnosis not present

## 2015-10-21 DIAGNOSIS — K5900 Constipation, unspecified: Secondary | ICD-10-CM | POA: Diagnosis not present

## 2015-10-21 DIAGNOSIS — F0390 Unspecified dementia without behavioral disturbance: Secondary | ICD-10-CM | POA: Diagnosis not present

## 2015-10-21 LAB — CBC WITH DIFFERENTIAL/PLATELET
Basophils Absolute: 0 10*3/uL (ref 0–0.1)
Basophils Relative: 1 %
Eosinophils Absolute: 0.5 10*3/uL (ref 0–0.7)
Eosinophils Relative: 9 %
HCT: 37.3 % — ABNORMAL LOW (ref 40.0–52.0)
Hemoglobin: 12.7 g/dL — ABNORMAL LOW (ref 13.0–18.0)
Lymphocytes Relative: 6 %
Lymphs Abs: 0.4 10*3/uL — ABNORMAL LOW (ref 1.0–3.6)
MCH: 32.6 pg (ref 26.0–34.0)
MCHC: 34.1 g/dL (ref 32.0–36.0)
MCV: 95.5 fL (ref 80.0–100.0)
Monocytes Absolute: 0.8 10*3/uL (ref 0.2–1.0)
Monocytes Relative: 13 %
Neutro Abs: 4.4 10*3/uL (ref 1.4–6.5)
Neutrophils Relative %: 71 %
Platelets: 204 10*3/uL (ref 150–440)
RBC: 3.91 MIL/uL — ABNORMAL LOW (ref 4.40–5.90)
RDW: 23.9 % — ABNORMAL HIGH (ref 11.5–14.5)
WBC: 6.2 10*3/uL (ref 3.8–10.6)

## 2015-10-21 LAB — COMPREHENSIVE METABOLIC PANEL
ALT: 17 U/L (ref 17–63)
AST: 24 U/L (ref 15–41)
Albumin: 3.3 g/dL — ABNORMAL LOW (ref 3.5–5.0)
Alkaline Phosphatase: 45 U/L (ref 38–126)
Anion gap: 6 (ref 5–15)
BUN: 12 mg/dL (ref 6–20)
CO2: 27 mmol/L (ref 22–32)
Calcium: 9.3 mg/dL (ref 8.9–10.3)
Chloride: 97 mmol/L — ABNORMAL LOW (ref 101–111)
Creatinine, Ser: 0.63 mg/dL (ref 0.61–1.24)
GFR calc Af Amer: >60 mL/min (ref >60)
GFR calc non Af Amer: >60 mL/min (ref >60)
Glucose, Bld: 119 mg/dL — ABNORMAL HIGH (ref 65–99)
Potassium: 3.7 mmol/L (ref 3.5–5.1)
Sodium: 130 mmol/L — ABNORMAL LOW (ref 135–145)
Total Bilirubin: 1 mg/dL (ref 0.3–1.2)
Total Protein: 5.9 g/dL — ABNORMAL LOW (ref 6.5–8.1)

## 2015-10-21 LAB — MAGNESIUM: Magnesium: 1.7 mg/dL (ref 1.7–2.4)

## 2015-10-21 NOTE — Progress Notes (Signed)
Patient ambulates with a cane, brought to exam room 2, accompanied by his family member and sitter.  Patient denies pain or discomfort currently, but states he has right shoulder and back pain at night.  Patient states raising his right hand aggravates his shoulder pain.  Patient c/o a rash on his face, bilateral hands and arms.  Patient states the rash started after he stared his oral chemo.   Patient states he is not currently taking his BP medication per his Dr.'s request.  BP 124/80 , HR 85 Temp 96.6, vitals documented.  Medication record updated, information provided by patient, family member and patient's sitter.  Dr. Ree Kida .

## 2015-10-22 ENCOUNTER — Ambulatory Visit
Admission: RE | Admit: 2015-10-22 | Discharge: 2015-10-22 | Disposition: A | Discharge status: Home / Self Care | Payer: Medicare Other | Source: Ambulatory Visit | Attending: Radiation Oncology | Admitting: Radiation Oncology

## 2015-10-22 ENCOUNTER — Ambulatory Visit: Payer: Medicare Other

## 2015-10-22 DIAGNOSIS — C20 Malignant neoplasm of rectum: Secondary | ICD-10-CM | POA: Diagnosis not present

## 2015-10-23 ENCOUNTER — Ambulatory Visit: Payer: Medicare Other

## 2015-10-26 ENCOUNTER — Ambulatory Visit: Payer: Medicare Other

## 2015-11-09 ENCOUNTER — Telehealth: Payer: Self-pay | Admitting: *Deleted

## 2015-11-09 NOTE — Telephone Encounter (Signed)
Called and spoke to Kenneth Luna the caretaker for the patient and he does not need to start back on xeloda until he has his surgery and then comes back to see corcoran and then at that point will start xeloda but an increase dose. She understands above and will call back if she has any problems or questions

## 2015-11-09 NOTE — Telephone Encounter (Signed)
Called and asked she get a call back regarding whether Xeloda needs to be refilled

## 2015-11-10 NOTE — Telephone Encounter (Signed)
I called [facility] and updated them on plan of treatment, they are discontinuing the current prescription and will await a new prescription when patient needs to start back on therapy

## 2015-11-16 DIAGNOSIS — M7581 Other shoulder lesions, right shoulder: Secondary | ICD-10-CM | POA: Insufficient documentation

## 2015-11-16 DIAGNOSIS — M75121 Complete rotator cuff tear or rupture of right shoulder, not specified as traumatic: Secondary | ICD-10-CM | POA: Insufficient documentation

## 2015-11-25 ENCOUNTER — Encounter: Payer: Self-pay | Admitting: Radiation Oncology

## 2015-11-25 ENCOUNTER — Ambulatory Visit
Admission: RE | Admit: 2015-11-25 | Discharge: 2015-11-25 | Disposition: A | Discharge status: Home / Self Care | Payer: Medicare Other | Source: Ambulatory Visit | Attending: Radiation Oncology | Admitting: Radiation Oncology

## 2015-11-25 VITALS — BP 154/55 | HR 55 | Temp 95.8°F | Resp 18 | Wt 126.4 lb

## 2015-11-25 DIAGNOSIS — C20 Malignant neoplasm of rectum: Secondary | ICD-10-CM

## 2015-11-25 DIAGNOSIS — Z923 Personal history of irradiation: Secondary | ICD-10-CM | POA: Insufficient documentation

## 2015-11-25 NOTE — Progress Notes (Signed)
Radiation Oncology Follow up Note  Name: Kenneth Luna.   Date:   11/25/2015 MRN:  [id number] DOB: 1932-06-14    This 82 y.o. male presents to the clinic today for one-month follow-up status post new adjuvant chemoradiation for rectal cancer stage IIa.  REFERRING PROVIDER: Dion Body, MD  HPI: Patient is a 82 year old male now out 1 month having completed concurrent chemoradiation and neoadjuvant fashion for stage IIa (T3 N0 M0) adenocarcinoma the rectum. He is seen today in routine follow-up one month out and is doing well specifically denies diarrhea dysuria or any other GI/GU complaints. Has been seen by the surgeon although was having difficulty taking the exact area of tumor involvement since appears grossly to be a complete response. Patient is undergoing CT scans and I believe MRI scans to better delineate area of involvement for the surgeon. Surgery is scheduled for the end of this month.  COMPLICATIONS OF TREATMENT: none  FOLLOW UP COMPLIANCE: keeps appointments   PHYSICAL EXAM:  BP (!) 154/55   Pulse (!) 55   Temp (!) 95.8 F (35.4 C)   Resp 18   Wt 126 lb 6.9 oz (57.3 kg)   BMI 21.04 kg/m  Well-developed well-nourished patient in NAD. HEENT reveals PERLA, EOMI, discs not visualized.  Oral cavity is clear. No oral mucosal lesions are identified. Neck is clear without evidence of cervical or supraclavicular adenopathy. Lungs are clear to A&P. Cardiac examination is essentially unremarkable with regular rate and rhythm without murmur rub or thrill. Abdomen is benign with no organomegaly or masses noted. Motor sensory and DTR levels are equal and symmetric in the upper and lower extremities. Cranial nerves II through XII are grossly intact. Proprioception is intact. No peripheral adenopathy or edema is identified. No motor or sensory levels are noted. Crude visual fields are within normal range.  RADIOLOGY RESULTS: No current films for review  PLAN: Present time  patient is doing well with excellent side effect profile status post concurrent chemoradiation in a neoadjuvant setting for rectal cancer. I've asked to see the patient back in about 3 months at which time I'll review the pathology. Patient is also scheduled to see medical oncology after completion of surgery. Patient knows to call sooner with any concerns. Follow-up appoint was given.  I would like to take this opportunity to thank you for allowing me to participate in the care of your patient.Armstead Peaks., MD

## 2015-12-17 HISTORY — PX: ABDOMINOPERINEAL PROCTOCOLECTOMY: SUR8

## 2016-01-17 NOTE — Progress Notes (Signed)
[facility]-  Cancer Center  Clinic day: 10/21/2015  Chief Complaint: Kenneth A Gershman Jr. is a 82 y.o. male with rectal cancer who is seen for weekly assessment during neoadjuvant concurrent Xeloda and radiation.  HPI:  The patient was last seen by me in the medical oncology clinic on 10/16/2015.  At that time, he had rare rectal discomfort.  He had 3-4 soft bowel movements/day.  He had no mouth sores or hand foot syndrome.  He had a stable facial rash and mild rectal erythema.  Hyponatremia was slightly better.  Symptomatically, he has been doing fairly well. He has had some constipation due to Imodium. He has had some radiation cystitis. He denies any melena or hematochezia.  He continues to drink wine daily.  Radiation completes tomorrow.  He has a preop evaluation on 11/23/2015 and surgery scheduled for 12/15/2015.   Past Medical History:  Diagnosis Date  . Allergic rhinitis   . Anemia   . B12 deficiency 06/29/2015  . Benign neoplasm of ascending colon   . Benign neoplasm of descending colon   . Colon polyp   . DD (diverticular disease) 06/29/2015  . Dementia    memory issues  . Fatty liver   . GERD (gastroesophageal reflux disease)   . Glaucoma   . Hyperlipidemia   . Hypertension   . Rectal adenocarcinoma (HCC) 07/15/2015  . Substance abuse    alcohol  . Traumatic intraparenchymal hemorrhage (HCC)     Past Surgical History:  Procedure Laterality Date  . CATARACT EXTRACTION W/ INTRAOCULAR LENS IMPLANT    . COLONOSCOPY WITH PROPOFOL N/A 07/10/2015   Procedure: COLONOSCOPY WITH PROPOFOL;  Surgeon: Darren Wohl, MD;  Location: [facility];  Service: Endoscopy;  Laterality: N/A;  PER CAREGIVER WAS TOLD THAT PT WOULD BE 1ST (KEEP PT EARLY AM)  . POLYPECTOMY  07/10/2015   Procedure: POLYPECTOMY;  Surgeon: Darren Wohl, MD;  Location: [facility];  Service: Endoscopy;;    Family History  Problem Relation Age of Onset  . Cancer Father    Prostate    Social History:  reports that he quit smoking about 37 years ago. His smoking use included Pipe. He has never used smokeless tobacco. He reports that he drinks about 16.8 oz of alcohol per week . He reports that he does not use drugs.  He drinks 3 glasses of wine a day.  The patient is widowed.  He has no children.  The patient is accompanied by his caregiver, Glenda Lambert, and Christine, his step-daughter, today.  Allergies: No Known Allergies  Current Medications: Current Outpatient Prescriptions  Medication Sig Dispense Refill  . brimonidine-timolol (COMBIGAN) 0.2-0.5 % ophthalmic solution Place 1 drop into both eyes 2 (two) times daily.    . capecitabine (XELODA) 150 MG tablet Take two 150 mg tablets (total 300 mg) twice a day Monday-Friday during radiation (Patient not taking: Reported on 11/25/2015) 100 tablet 0  . capecitabine (XELODA) 500 MG tablet Take two 500 mg tablets (1000 mg total) twice a day on Monday - Friday during radiation (Patient not taking: Reported on 11/25/2015) 100 tablet 0  . cetirizine (ZYRTEC) 10 MG tablet Take 10 mg by mouth daily.     . cholecalciferol (VITAMIN D) 1000 units tablet Take 1,000 Units by mouth daily.    . donepezil (ARICEPT) 10 MG tablet Take 10 mg by mouth at bedtime.    . fluticasone (FLONASE) 50 MCG/ACT nasal spray Place 2 sprays into both nostrils daily as needed   for rhinitis.     . folic acid (FOLVITE) 1 MG tablet Take 1 tablet (1 mg total) by mouth daily. 30 tablet 2  . gabapentin (NEURONTIN) 100 MG capsule     . hydrocortisone (ANUSOL-HC) 25 MG suppository Place 1 suppository (25 mg total) rectally 2 (two) times daily. 12 suppository 0  . montelukast (SINGULAIR) 10 MG tablet Take 10 mg by mouth at bedtime. Reported on 08/21/2015    . mupirocin ointment (BACTROBAN) 2 % Apply 1 application topically daily as needed.   0  . omeprazole (PRILOSEC) 20 MG capsule Take 1 capsule (20 mg total) by mouth daily. 30 capsule 0  . ondansetron  (ZOFRAN) 8 MG tablet Take 1 tablet (8 mg total) by mouth every 8 (eight) hours as needed for nausea or vomiting. 20 tablet 1  . thiamine 100 MG tablet Take 1 tablet (100 mg total) by mouth daily. 30 tablet 2  . valsartan (DIOVAN) 80 MG tablet Take 80 mg by mouth daily.    . vitamin B-12 (CYANOCOBALAMIN) 1000 MCG tablet Take 1,000 mcg by mouth daily.    . clobetasol cream (TEMOVATE) 0.05 % Apply topically.    . furosemide (LASIX) 20 MG tablet Take by mouth.    . meloxicam (MOBIC) 15 MG tablet Take 15 mg by mouth.    . metoprolol succinate (TOPROL XL) 25 MG 24 hr tablet Take 1 tablet (25 mg total) by mouth daily. (Patient not taking: Reported on 10/16/2015) 30 tablet 0   No current facility-administered medications for this visit.     Review of Systems:  GENERAL:  Feels "good".  No fevers or sweats .  Weight stable. PERFORMANCE STATUS (ECOG):  2 HEENT: No visual changes, sore throat, mouth sores or tenderness. Lungs: No shortness of breath or cough.  No hemoptysis. Cardiac:  No chest pain, palpitations, orthopnea, or PND. GI:  Constipation caused by Imodium.  No rectal bleeding.  No nausea or vomiting. GU:  No urgency, frequency, dysuria, or hematuria. Musculoskeletal:  Chronic back pain secondary to T12 compression fracture.  Left shoulder sore s/p fracture.  No muscle tenderness. Extremities:  No pain or swelling. Skin:  Facial rash.  Neuro:  Mild dementia.  Balance and coordination off (chronic).  No headache, numbness or weakness. Endocrine:  No diabetes, thyroid issues, hot flashes or night sweats. Psych:  No mood changes, depression or anxiety. Pain:  Back pain, managed by medications. Review of systems:  All other systems reviewed and found to be negative.  Physical Exam: Blood pressure 124/80, pulse 85, temperature (!) 96.6 F (35.9 C), temperature source Tympanic, weight 130 lb 13.5 oz (59.4 kg). GENERAL:  Well developed, well nourished, elderly gentleman sitting comfortably in  the exam room in no acute distress.  He has a rolling walker at his side. MENTAL STATUS:  Alert and oriented to person, place and time. HEAD: Gray hair.  Normocephalic, atraumatic, face symmetric, no Cushingoid features. EYES:  Glasses.  Blue eyes.  Pupils equal round and reactive to light and accomodation.  No conjunctivitis or scleral icterus. ENT:  Oropharynx clear without lesion.  Tongue normal.  Mucous membranes moist.  RESPIRATORY:  Clear to auscultation without rales, wheezes or rhonchi. CARDIOVASCULAR:  Regular rate and rhythm without murmur, rub or gallop. ABDOMEN:  Soft, non-tender, with active bowel sounds, and no hepatosplenomegaly.  No masses. SKIN:  Patchy macular facial rash.  No palmar erythema or desquamation.  EXTREMITIES: No edema, no skin discoloration or tenderness.  No palpable cords. LYMPH NODES:   No palpable cervical, supraclavicular, axillary or inguinal adenopathy  NEUROLOGICAL: Unremarkable. PSYCH:  Appropriate.   Appointment on 10/21/2015  Component Date Value Ref Range Status  . WBC 10/21/2015 6.2  3.8 - 10.6 K/uL Final  . RBC 10/21/2015 3.91* 4.40 - 5.90 MIL/uL Final  . Hemoglobin 10/21/2015 12.7* 13.0 - 18.0 g/dL Final  . HCT 10/21/2015 37.3* 40.0 - 52.0 % Final  . MCV 10/21/2015 95.5  80.0 - 100.0 fL Final  . MCH 10/21/2015 32.6  26.0 - 34.0 pg Final  . MCHC 10/21/2015 34.1  32.0 - 36.0 g/dL Final  . RDW 10/21/2015 23.9* 11.5 - 14.5 % Final  . Platelets 10/21/2015 204  150 - 440 K/uL Final  . Neutrophils Relative % 10/21/2015 71  % Final  . Neutro Abs 10/21/2015 4.4  1.4 - 6.5 K/uL Final  . Lymphocytes Relative 10/21/2015 6  % Final  . Lymphs Abs 10/21/2015 0.4* 1.0 - 3.6 K/uL Final  . Monocytes Relative 10/21/2015 13  % Final  . Monocytes Absolute 10/21/2015 0.8  0.2 - 1.0 K/uL Final  . Eosinophils Relative 10/21/2015 9  % Final  . Eosinophils Absolute 10/21/2015 0.5  0 - 0.7 K/uL Final  . Basophils Relative 10/21/2015 1  % Final  . Basophils  Absolute 10/21/2015 0.0  0 - 0.1 K/uL Final  . Sodium 10/21/2015 130* 135 - 145 mmol/L Final  . Potassium 10/21/2015 3.7  3.5 - 5.1 mmol/L Final  . Chloride 10/21/2015 97* 101 - 111 mmol/L Final  . CO2 10/21/2015 27  22 - 32 mmol/L Final  . Glucose, Bld 10/21/2015 119* 65 - 99 mg/dL Final  . BUN 10/21/2015 12  6 - 20 mg/dL Final  . Creatinine, Ser 10/21/2015 0.63  0.61 - 1.24 mg/dL Final  . Calcium 10/21/2015 9.3  8.9 - 10.3 mg/dL Final  . Total Protein 10/21/2015 5.9* 6.5 - 8.1 g/dL Final  . Albumin 10/21/2015 3.3* 3.5 - 5.0 g/dL Final  . AST 10/21/2015 24  15 - 41 U/L Final  . ALT 10/21/2015 17  17 - 63 U/L Final  . Alkaline Phosphatase 10/21/2015 45  38 - 126 U/L Final  . Total Bilirubin 10/21/2015 1.0  0.3 - 1.2 mg/dL Final  . GFR calc non Af Amer 10/21/2015 >60  >60 mL/min Final  . GFR calc Af Amer 10/21/2015 >60  >60 mL/min Final   Comment: (NOTE) The eGFR has been calculated using the CKD EPI equation. This calculation has not been validated in all clinical situations. eGFR's persistently <60 mL/min signify possible Chronic Kidney Disease.   . Anion gap 10/21/2015 6  5 - 15 Final  . Magnesium 10/21/2015 1.7  1.7 - 2.4 mg/dL Final    Assessment:  Isauro A Charrier Jr. is a 82 y.o. male with a T3uN0 low rectal adenocarcinoma.  He presented with rectal bleeding.  Colonoscopy on 07/10/2015 revealed a 4 cm frond-like/villous ulcerated non-obstructing mass in the rectum.  Biopsy revealed adenocarcinoma.  Chest, abdomen, and pelvic CT scan on 07/27/2015 revealed a circumferential soft tissue mass within the low rectum. He was a T12 compression fracture.  CEA was 4.1 on 07/26/2015.  Flexible sigmoidoscopy and ultrasound on 08/19/2015 revealed a 3.0 x 1.4 cm hypoechoic non-circumferential mass was found in the rectum.  There was no sonographic evidence suggesting breakthrough of the muscularis propria with invasion into the perirectal fat.  No lymph nodes were seen in the perirectal  region and in the left iliac region. Stage was early T3uN0.  He   was admitted to [facility] from 07/24/2015 - 07/30/2015 with acute right basal ganglia hemorrhage and left humeral head fracture s/p a fall on 07/21/2015.  He has 24 hour home care.  He is fairly independent with a walker.    He was admitted to [facility] in [place] from 08/08/2015 - 08/09/2015 with acute diverticulitis.  He was discharged home on Augmentin rather than Flagyl given his alcohol dependency.  He drinks alcohol daily (3 glasses of wine).  He has mild dementia.   He is nearing completion of 5 weeks of concurrent Xeloda and radiation (09/09/2015 - 10/22/2015).  Radiation completes on 10/22/2015.  He is scheduled for surgery on 12/15/2015.  Symptomatically, he is doing well.  He has no mouth sores or hand foot syndrome.  He has had some constipation due to use of Imodium.  He has had radiation cystitis. He has chronic hyponatremia (130).  Plan: 1.  Labs today:  CBC with diff, CMP. 2.  Discuss plans for completion of radiation and chemotherapy tomorrow.  Last Xeloda tomorrow. 3.  Discuss plans for surgery scheduled for 12/15/2015. 4.  Discuss full dose Xeloda (1000 mg/m2 days 1-14 with 7 days off x 6 cycles to complete 6 months of perioperative therapy) after recovery from surgery. 5.  Discuss sodium and fluid/salt intake. 6.  Encourage decreased alcohol intake. 7.  RTC after 4 weeks after surgery (Glenda will call for appointment) 8.  Patient to call if any concerns.   Melissa C Corcoran, MD  10/21/2015 

## 2016-01-29 ENCOUNTER — Other Ambulatory Visit: Payer: Self-pay | Admitting: *Deleted

## 2016-01-29 DIAGNOSIS — C20 Malignant neoplasm of rectum: Secondary | ICD-10-CM

## 2016-01-31 NOTE — Progress Notes (Signed)
[place] Clinic day: 02/01/2016   Chief Complaint: Kenneth Brozek. is a 82 y.o. male with rectal cancer s/p neoadjuvant Xeloda and radiation who is seen for reassessment after interval surgery.  HPI:  The patient was last seen in the medical oncology clinic on 10/21/2015.  At that time, he was doing well.  He had no mouth sores or hand foot syndrome.  He had some constipation due to use of Imodium.  He had radiation cystitis.  He had chronic hyponatremia (130).  He completed Xeloda and radiation on 10/22/2015.  He underwent robotic proctectomy with perineal approach and end colostomy at [facility] on 12/17/2015 at [facility]. Pathology revealed [id number] (stage IIC) invasive moderately differentiated adenocarcinoma.  Levator muscle was involved.  He tolerated his surgery well with the only complication of difficulty voiding that resolved within 48 hours.  He was discharged to home with his caretaker and home nursing 3 days a week and PT 2 days a week.  He has a follow-up at [facility] around 03/25/2016.  Symptomatically, he feels "okay".  Sometimes he notes pain from the surgery on his bottom. He has had no bleeding in a week. His caregiver notes that he has some of his Xeloda left from his neoadjuvant therapy.   Past Medical History:  Diagnosis Date  . Allergic rhinitis   . Anemia   . B12 deficiency 06/29/2015  . Benign neoplasm of ascending colon   . Benign neoplasm of descending colon   . Colon polyp   . DD (diverticular disease) 06/29/2015  . Dementia    memory issues  . Fatty liver   . GERD (gastroesophageal reflux disease)   . Glaucoma   . Hyperlipidemia   . Hypertension   . Rectal adenocarcinoma ([place]) 07/15/2015  . Substance abuse    alcohol  . Traumatic intraparenchymal hemorrhage [facility])     Past Surgical History:  Procedure Laterality Date  . CATARACT EXTRACTION W/ INTRAOCULAR LENS IMPLANT    . COLONOSCOPY WITH PROPOFOL N/A 07/10/2015   Procedure: COLONOSCOPY WITH PROPOFOL;  Surgeon: Lucilla Lame, MD;  Location: [facility];  Service: Endoscopy;  Laterality: N/A;  PER CAREGIVER WAS TOLD THAT PT WOULD BE 1ST (KEEP PT EARLY AM)  . POLYPECTOMY  07/10/2015   Procedure: POLYPECTOMY;  Surgeon: Lucilla Lame, MD;  Location: [facility];  Service: Endoscopy;;    Family History  Problem Relation Age of Onset  . Cancer Father     Prostate    Social History:  reports that he quit smoking about 37 years ago. His smoking use included Pipe. He has never used smokeless tobacco. He reports that he drinks about 16.8 oz of alcohol per week . He reports that he does not use drugs.  He drinks 3 glasses of wine a day.  The patient is widowed.  He has no children.  The patient is accompanied by his caregiver, [name], today.  Allergies: No Known Allergies  Current Medications: Current Outpatient Prescriptions  Medication Sig Dispense Refill  . brimonidine-timolol (COMBIGAN) 0.2-0.5 % ophthalmic solution Place 1 drop into both eyes 2 (two) times daily.    . cetirizine (ZYRTEC) 10 MG tablet Take 10 mg by mouth daily.     . cholecalciferol (VITAMIN D) 1000 units tablet Take 1,000 Units by mouth daily.    . clobetasol cream (TEMOVATE) 0.05 % Apply topically.    . docusate sodium (COLACE) 100 MG capsule Take 100 mg by mouth daily.    Marland Kitchen  donepezil (ARICEPT) 10 MG tablet Take 10 mg by mouth at bedtime.    . fluticasone (FLONASE) 50 MCG/ACT nasal spray Place 2 sprays into both nostrils daily as needed for rhinitis.     Marland Kitchen gabapentin (NEURONTIN) 100 MG capsule     . montelukast (SINGULAIR) 10 MG tablet Take 10 mg by mouth at bedtime. Reported on 08/21/2015    . mupirocin ointment (BACTROBAN) 2 % Apply 1 application topically daily as needed.   0  . omeprazole (PRILOSEC) 20 MG capsule Take 1 capsule (20 mg total) by mouth daily. 30 capsule 0  . thiamine 100 MG tablet Take 1 tablet (100 mg total) by mouth daily. 30 tablet 2  .  capecitabine (XELODA) 150 MG tablet Take two 150 mg tablets (total 300 mg) twice a day Monday-Friday during radiation (Patient not taking: Reported on 02/01/2016) 100 tablet 0  . capecitabine (XELODA) 500 MG tablet Take two 500 mg tablets (1000 mg total) twice a day on Monday - Friday during radiation (Patient not taking: Reported on 02/01/2016) 169 tablet 0  . folic acid (FOLVITE) 1 MG tablet Take 1 tablet (1 mg total) by mouth daily. (Patient not taking: Reported on 02/01/2016) 30 tablet 2  . furosemide (LASIX) 20 MG tablet Take by mouth.    . hydrocortisone (ANUSOL-HC) 25 MG suppository Place 1 suppository (25 mg total) rectally 2 (two) times daily. (Patient not taking: Reported on 02/01/2016) 12 suppository 0  . meloxicam (MOBIC) 15 MG tablet Take 15 mg by mouth.    . metoprolol succinate (TOPROL XL) 25 MG 24 hr tablet Take 1 tablet (25 mg total) by mouth daily. (Patient not taking: Reported on 02/01/2016) 30 tablet 0  . ondansetron (ZOFRAN) 8 MG tablet Take 1 tablet (8 mg total) by mouth every 8 (eight) hours as needed for nausea or vomiting. (Patient not taking: Reported on 02/01/2016) 20 tablet 1  . valsartan (DIOVAN) 80 MG tablet Take 80 mg by mouth daily.    . vitamin B-12 (CYANOCOBALAMIN) 1000 MCG tablet Take 1,000 mcg by mouth daily.     No current facility-administered medications for this visit.     Review of Systems:  GENERAL:  Feels "OK".  No fevers or sweats .  Weight u.p 2 pounds PERFORMANCE STATUS (ECOG):  2 HEENT: No visual changes, sore throat, mouth sores or tenderness. Lungs: No shortness of breath or cough.  No hemoptysis. Cardiac:  No chest pain, palpitations, orthopnea, or PND. GI:  Ostomy working well.  No nausea, vomiting, diarrhea, or constipation. GU:  No urgency, frequency, dysuria, or hematuria. Musculoskeletal:  Chronic back pain secondary to T12 compression fracture.  No pain or muscle tenderness. Extremities:  No pain or swelling. Skin:  No rashes, skin  changes or ulcers.  Neuro:  Mild dementia.  Balance and coordination off (chronic).  No headache, numbness or weakness. Endocrine:  No diabetes, thyroid issues, hot flashes or night sweats. Psych:  No mood changes, depression or anxiety. Pain: Mild rectal pain. Review of systems:  All other systems reviewed and found to be negative.  Physical Exam: Blood pressure 108/65, pulse (!) 54, temperature (!) 95.1 F (35.1 C), temperature source Tympanic, resp. rate 18, weight 132 lb 0.9 oz (59.9 kg). GENERAL:  Well developed, well nourished, elderly gentleman sitting comfortably in the exam room in no acute distress.  He has a 4 point cane at his side MENTAL STATUS:  Alert and oriented to person, place and time. HEAD: Pearline Cables hair.  Normocephalic, atraumatic, face  symmetric, no Cushingoid features. EYES:  Glasses.  Blue eyes.  Pupils equal round and reactive to light and accomodation.  No conjunctivitis or scleral icterus. ENT:  Oropharynx clear without lesion.  Tongue normal.  Mucous membranes moist.  RESPIRATORY:  Clear to auscultation without rales, wheezes or rhonchi. CARDIOVASCULAR:  Regular rate and rhythm without murmur, rub or gallop. ABDOMEN:  Ostomy.  Soft, non-tender, with active bowel sounds, and no hepatosplenomegaly.  No masses. RECTUM:  Well healed.  No bleeding. SKIN:  No rashes or ulcers.  EXTREMITIES: No edema, no skin discoloration or tenderness.  No palpable cords. LYMPH NODES: No palpable cervical, supraclavicular, axillary or inguinal adenopathy  NEUROLOGICAL: Unremarkable. PSYCH:  Appropriate.   Orders Only on 02/01/2016  Component Date Value Ref Range Status  . WBC 02/01/2016 4.8  3.8 - 10.6 K/uL Final  . RBC 02/01/2016 3.74* 4.40 - 5.90 MIL/uL Final  . Hemoglobin 02/01/2016 10.2* 13.0 - 18.0 g/dL Final  . HCT 02/01/2016 30.3* 40.0 - 52.0 % Final  . MCV 02/01/2016 80.9  80.0 - 100.0 fL Final  . MCH 02/01/2016 27.3  26.0 - 34.0 pg Final  . MCHC 02/01/2016 33.7  32.0 -  36.0 g/dL Final  . RDW 02/01/2016 15.0* 11.5 - 14.5 % Final  . Platelets 02/01/2016 256  150 - 440 K/uL Final  . Neutrophils Relative % 02/01/2016 64  % Final  . Neutro Abs 02/01/2016 3.0  1.4 - 6.5 K/uL Final  . Lymphocytes Relative 02/01/2016 19  % Final  . Lymphs Abs 02/01/2016 0.9* 1.0 - 3.6 K/uL Final  . Monocytes Relative 02/01/2016 12  % Final  . Monocytes Absolute 02/01/2016 0.6  0.2 - 1.0 K/uL Final  . Eosinophils Relative 02/01/2016 4  % Final  . Eosinophils Absolute 02/01/2016 0.2  0 - 0.7 K/uL Final  . Basophils Relative 02/01/2016 1  % Final  . Basophils Absolute 02/01/2016 0.1  0 - 0.1 K/uL Final  . Sodium 02/01/2016 138  135 - 145 mmol/L Final  . Potassium 02/01/2016 4.6  3.5 - 5.1 mmol/L Final  . Chloride 02/01/2016 102  101 - 111 mmol/L Final  . CO2 02/01/2016 31  22 - 32 mmol/L Final  . Glucose, Bld 02/01/2016 92  65 - 99 mg/dL Final  . BUN 02/01/2016 11  6 - 20 mg/dL Final  . Creatinine, Ser 02/01/2016 0.78  0.61 - 1.24 mg/dL Final  . Calcium 02/01/2016 9.8  8.9 - 10.3 mg/dL Final  . Total Protein 02/01/2016 6.8  6.5 - 8.1 g/dL Final  . Albumin 02/01/2016 3.5  3.5 - 5.0 g/dL Final  . AST 02/01/2016 28  15 - 41 U/L Final  . ALT 02/01/2016 14* 17 - 63 U/L Final  . Alkaline Phosphatase 02/01/2016 57  38 - 126 U/L Final  . Total Bilirubin 02/01/2016 0.4  0.3 - 1.2 mg/dL Final  . GFR calc non Af Amer 02/01/2016 >60  >60 mL/min Final  . GFR calc Af Amer 02/01/2016 >60  >60 mL/min Final   Comment: (NOTE) The eGFR has been calculated using the CKD EPI equation. This calculation has not been validated in all clinical situations. eGFR's persistently <60 mL/min signify possible Chronic Kidney Disease.   . Anion gap 02/01/2016 5  5 - 15 Final  . Magnesium 02/01/2016 2.0  1.7 - 2.4 mg/dL Final    Assessment:  Kenneth Luna. is a 82 y.o. male with stage IIC (T4bN0M0) low rectal adenocarcinoma s/p neoadjuvant chemotherapy and radiation followed by  surgery.  He  presented with rectal bleeding.  Colonoscopy on 07/10/2015 revealed a 4 cm frond-like/villous ulcerated non-obstructing mass in the rectum.  Biopsy revealed adenocarcinoma.  Chest, abdomen, and pelvic CT scan on 07/27/2015 revealed a circumferential soft tissue mass within the low rectum. He was a T12 compression fracture.  CEA was 4.1 on 07/26/2015.  Flexible sigmoidoscopy and ultrasound on 08/19/2015 revealed a 3.0 x 1.4 cm hypoechoic non-circumferential mass was found in the rectum.  There was no sonographic evidence suggesting breakthrough of the muscularis propria with invasion into the perirectal fat.  No lymph nodes were seen in the perirectal region and in the left iliac region. Stage was early T3uN0.  He completed 5 weeks of concurrent Xeloda and radiation (09/09/2015 - 10/22/2015). He is scheduled for surgery on 12/15/2015.  He underwent robotic assisted APR with end colostomy at [facility] on 12/17/2015 at [facility].  Pathology revealed ypT4bN0M0 (stage IIC) invasive moderately differentiated adenocarcinoma.  Levator muscle was involved.  He was admitted to [facility] from 07/24/2015 - 07/30/2015 with acute right basal ganglia hemorrhage and left humeral head fracture s/p a fall on 07/21/2015.  He has 24 hour home care.  He is fairly independent with a walker.  He was admitted to [facility] in [place] from 08/08/2015 - 08/09/2015 with acute diverticulitis.  He was discharged home on Augmentin rather than Flagyl given his alcohol dependency.    He has a normocytic anemia. He denies any bleeding in the past week.  Diet is good.  He drinks alcohol daily (3 glasses of wine).  He has mild dementia.   Symptomatically, he is doing well.    Plan: 1.  Labs today:  CBC with diff, CMP, CEA, B12, folate, ferritin. 2.  Discuss full dose Xeloda (1000 mg/m2 days 1-14 with 7 days off x 6 cycles to complete 6 months of perioperative therapy). 3.  Preauth Xeloda 2 weeks on/1 week off 4.  RTC for teaching and MD  assessment prior to start (unless within 1 week of today) 5.  RTC 1 week after initiation of Xeloda for MD assessment and labs (CBC with diff, BMP)   Lequita Asal, MD  02/01/2016, 11:07 AM

## 2016-02-01 ENCOUNTER — Inpatient Hospital Stay: Payer: Medicare Other | Attending: Hematology and Oncology

## 2016-02-01 ENCOUNTER — Encounter: Payer: Self-pay | Admitting: Hematology and Oncology

## 2016-02-01 ENCOUNTER — Inpatient Hospital Stay (HOSPITAL_BASED_OUTPATIENT_CLINIC_OR_DEPARTMENT_OTHER): Payer: Medicare Other | Admitting: Hematology and Oncology

## 2016-02-01 ENCOUNTER — Other Ambulatory Visit: Payer: Self-pay

## 2016-02-01 VITALS — BP 108/65 | HR 54 | Temp 95.1°F | Resp 18 | Wt 132.1 lb

## 2016-02-01 DIAGNOSIS — Z923 Personal history of irradiation: Secondary | ICD-10-CM | POA: Insufficient documentation

## 2016-02-01 DIAGNOSIS — G8929 Other chronic pain: Secondary | ICD-10-CM | POA: Diagnosis not present

## 2016-02-01 DIAGNOSIS — Z9221 Personal history of antineoplastic chemotherapy: Secondary | ICD-10-CM | POA: Insufficient documentation

## 2016-02-01 DIAGNOSIS — E785 Hyperlipidemia, unspecified: Secondary | ICD-10-CM

## 2016-02-01 DIAGNOSIS — Z87891 Personal history of nicotine dependence: Secondary | ICD-10-CM

## 2016-02-01 DIAGNOSIS — K5900 Constipation, unspecified: Secondary | ICD-10-CM | POA: Diagnosis not present

## 2016-02-01 DIAGNOSIS — D649 Anemia, unspecified: Secondary | ICD-10-CM | POA: Diagnosis not present

## 2016-02-01 DIAGNOSIS — F0390 Unspecified dementia without behavioral disturbance: Secondary | ICD-10-CM

## 2016-02-01 DIAGNOSIS — I10 Essential (primary) hypertension: Secondary | ICD-10-CM | POA: Diagnosis not present

## 2016-02-01 DIAGNOSIS — Z87311 Personal history of (healed) other pathological fracture: Secondary | ICD-10-CM | POA: Diagnosis not present

## 2016-02-01 DIAGNOSIS — K219 Gastro-esophageal reflux disease without esophagitis: Secondary | ICD-10-CM | POA: Insufficient documentation

## 2016-02-01 DIAGNOSIS — E538 Deficiency of other specified B group vitamins: Secondary | ICD-10-CM | POA: Insufficient documentation

## 2016-02-01 DIAGNOSIS — N3040 Irradiation cystitis without hematuria: Secondary | ICD-10-CM

## 2016-02-01 DIAGNOSIS — Z79899 Other long term (current) drug therapy: Secondary | ICD-10-CM | POA: Diagnosis not present

## 2016-02-01 DIAGNOSIS — C20 Malignant neoplasm of rectum: Secondary | ICD-10-CM

## 2016-02-01 DIAGNOSIS — Z933 Colostomy status: Secondary | ICD-10-CM | POA: Insufficient documentation

## 2016-02-01 DIAGNOSIS — F1010 Alcohol abuse, uncomplicated: Secondary | ICD-10-CM

## 2016-02-01 DIAGNOSIS — M549 Dorsalgia, unspecified: Secondary | ICD-10-CM | POA: Insufficient documentation

## 2016-02-01 LAB — CBC WITH DIFFERENTIAL/PLATELET
Basophils Absolute: 0.1 10*3/uL (ref 0–0.1)
Basophils Relative: 1 %
Eosinophils Absolute: 0.2 10*3/uL (ref 0–0.7)
Eosinophils Relative: 4 %
HCT: 30.3 % — ABNORMAL LOW (ref 40.0–52.0)
Hemoglobin: 10.2 g/dL — ABNORMAL LOW (ref 13.0–18.0)
Lymphocytes Relative: 19 %
Lymphs Abs: 0.9 10*3/uL — ABNORMAL LOW (ref 1.0–3.6)
MCH: 27.3 pg (ref 26.0–34.0)
MCHC: 33.7 g/dL (ref 32.0–36.0)
MCV: 80.9 fL (ref 80.0–100.0)
Monocytes Absolute: 0.6 10*3/uL (ref 0.2–1.0)
Monocytes Relative: 12 %
Neutro Abs: 3 10*3/uL (ref 1.4–6.5)
Neutrophils Relative %: 64 %
Platelets: 256 10*3/uL (ref 150–440)
RBC: 3.74 MIL/uL — ABNORMAL LOW (ref 4.40–5.90)
RDW: 15 % — ABNORMAL HIGH (ref 11.5–14.5)
WBC: 4.8 10*3/uL (ref 3.8–10.6)

## 2016-02-01 LAB — COMPREHENSIVE METABOLIC PANEL
ALT: 14 U/L — ABNORMAL LOW (ref 17–63)
AST: 28 U/L (ref 15–41)
Albumin: 3.5 g/dL (ref 3.5–5.0)
Alkaline Phosphatase: 57 U/L (ref 38–126)
Anion gap: 5 (ref 5–15)
BUN: 11 mg/dL (ref 6–20)
CO2: 31 mmol/L (ref 22–32)
Calcium: 9.8 mg/dL (ref 8.9–10.3)
Chloride: 102 mmol/L (ref 101–111)
Creatinine, Ser: 0.78 mg/dL (ref 0.61–1.24)
GFR calc Af Amer: >60 mL/min (ref >60)
GFR calc non Af Amer: >60 mL/min (ref >60)
Glucose, Bld: 92 mg/dL (ref 65–99)
Potassium: 4.6 mmol/L (ref 3.5–5.1)
Sodium: 138 mmol/L (ref 135–145)
Total Bilirubin: 0.4 mg/dL (ref 0.3–1.2)
Total Protein: 6.8 g/dL (ref 6.5–8.1)

## 2016-02-01 LAB — MAGNESIUM: Magnesium: 2 mg/dL (ref 1.7–2.4)

## 2016-02-01 MED ORDER — ONDANSETRON HCL 8 MG PO TABS: 8.0000 mg | ORAL_TABLET | Freq: Two times a day (BID) | ORAL | 1 refills | Status: DC | PRN

## 2016-02-01 MED ORDER — CAPECITABINE 500 MG PO TABS: 1000.0000 mg/m2 | ORAL_TABLET | Freq: Two times a day (BID) | ORAL | 1 refills | Status: DC

## 2016-02-01 NOTE — Progress Notes (Signed)
START ON PATHWAY REGIMEN - Colorectal  ROS59: Capecitabine 1,000 mg/m2 PO BID on Days 1-14, Followed by 7 Days Off, q21 Days x 6 Cycles   A cycle is every 21 days:     Capecitabine (Xeloda(R)) 1,000 mg/m2 orally twice daily (2000 mg/m2/day) for 14 days followed by 7 days off) Dose Mod: None  **Always confirm dose/schedule in your pharmacy ordering system**    Patient Characteristics: Rectal Neoadjuvant/Adjuvant, T3 - T4, N0 or Any T, N+***, Postoperative - Had Neoadjuvant Therapy - Preoperative Node Negative AJCC T Stage: 4b AJCC N Stage: 0 AJCC Stage Grouping: IIC AJCC M Stage: 0 Current evidence of distant metastases? No  Intent of Therapy: Curative Intent, Discussed with Patient

## 2016-02-01 NOTE — Progress Notes (Signed)
Patient had colorectal surgery the end of August.  States he is doing well post operatively.

## 2016-02-02 LAB — CEA: CEA: 2.6 ng/mL (ref 0.0–4.7)

## 2016-02-10 ENCOUNTER — Telehealth: Payer: Self-pay | Admitting: *Deleted

## 2016-02-10 DIAGNOSIS — C20 Malignant neoplasm of rectum: Secondary | ICD-10-CM

## 2016-02-10 MED ORDER — CAPECITABINE 500 MG PO TABS: 1000.0000 mg/m2 | ORAL_TABLET | Freq: Two times a day (BID) | ORAL | 1 refills | Status: DC

## 2016-02-10 NOTE — Telephone Encounter (Signed)
Called to report that he still has not received his Xeloda

## 2016-02-23 ENCOUNTER — Other Ambulatory Visit: Payer: Self-pay | Admitting: *Deleted

## 2016-02-23 DIAGNOSIS — C20 Malignant neoplasm of rectum: Secondary | ICD-10-CM

## 2016-02-24 ENCOUNTER — Ambulatory Visit: Payer: Medicare Other

## 2016-02-24 ENCOUNTER — Inpatient Hospital Stay: Payer: Medicare Other | Attending: Hematology and Oncology | Admitting: Hematology and Oncology

## 2016-02-24 ENCOUNTER — Other Ambulatory Visit: Payer: Self-pay | Admitting: *Deleted

## 2016-02-24 VITALS — BP 121/70 | HR 65 | Temp 96.6°F | Resp 18 | Wt 133.8 lb

## 2016-02-24 DIAGNOSIS — M549 Dorsalgia, unspecified: Secondary | ICD-10-CM | POA: Insufficient documentation

## 2016-02-24 DIAGNOSIS — K219 Gastro-esophageal reflux disease without esophagitis: Secondary | ICD-10-CM

## 2016-02-24 DIAGNOSIS — R21 Rash and other nonspecific skin eruption: Secondary | ICD-10-CM | POA: Insufficient documentation

## 2016-02-24 DIAGNOSIS — Z9221 Personal history of antineoplastic chemotherapy: Secondary | ICD-10-CM | POA: Diagnosis not present

## 2016-02-24 DIAGNOSIS — K760 Fatty (change of) liver, not elsewhere classified: Secondary | ICD-10-CM | POA: Insufficient documentation

## 2016-02-24 DIAGNOSIS — Z79899 Other long term (current) drug therapy: Secondary | ICD-10-CM | POA: Diagnosis not present

## 2016-02-24 DIAGNOSIS — D649 Anemia, unspecified: Secondary | ICD-10-CM | POA: Diagnosis not present

## 2016-02-24 DIAGNOSIS — C20 Malignant neoplasm of rectum: Secondary | ICD-10-CM

## 2016-02-24 DIAGNOSIS — F0390 Unspecified dementia without behavioral disturbance: Secondary | ICD-10-CM | POA: Diagnosis not present

## 2016-02-24 DIAGNOSIS — E538 Deficiency of other specified B group vitamins: Secondary | ICD-10-CM | POA: Insufficient documentation

## 2016-02-24 DIAGNOSIS — Z923 Personal history of irradiation: Secondary | ICD-10-CM | POA: Insufficient documentation

## 2016-02-24 DIAGNOSIS — G8929 Other chronic pain: Secondary | ICD-10-CM

## 2016-02-24 DIAGNOSIS — Z87311 Personal history of (healed) other pathological fracture: Secondary | ICD-10-CM | POA: Diagnosis not present

## 2016-02-24 DIAGNOSIS — I10 Essential (primary) hypertension: Secondary | ICD-10-CM | POA: Insufficient documentation

## 2016-02-24 DIAGNOSIS — F1020 Alcohol dependence, uncomplicated: Secondary | ICD-10-CM

## 2016-02-24 DIAGNOSIS — Z87891 Personal history of nicotine dependence: Secondary | ICD-10-CM | POA: Diagnosis not present

## 2016-02-24 DIAGNOSIS — Z933 Colostomy status: Secondary | ICD-10-CM | POA: Diagnosis not present

## 2016-02-24 DIAGNOSIS — E785 Hyperlipidemia, unspecified: Secondary | ICD-10-CM | POA: Insufficient documentation

## 2016-02-24 DIAGNOSIS — G893 Neoplasm related pain (acute) (chronic): Secondary | ICD-10-CM | POA: Diagnosis not present

## 2016-02-24 LAB — CBC WITH DIFFERENTIAL/PLATELET
Basophils Absolute: 0 10*3/uL (ref 0–0.1)
Basophils Relative: 1 %
Eosinophils Absolute: 0.2 10*3/uL (ref 0–0.7)
Eosinophils Relative: 5 %
HCT: 30.3 % — ABNORMAL LOW (ref 40.0–52.0)
Hemoglobin: 9.9 g/dL — ABNORMAL LOW (ref 13.0–18.0)
Lymphocytes Relative: 21 %
Lymphs Abs: 1 10*3/uL (ref 1.0–3.6)
MCH: 25.1 pg — ABNORMAL LOW (ref 26.0–34.0)
MCHC: 32.6 g/dL (ref 32.0–36.0)
MCV: 77.1 fL — ABNORMAL LOW (ref 80.0–100.0)
Monocytes Absolute: 0.5 10*3/uL (ref 0.2–1.0)
Monocytes Relative: 10 %
Neutro Abs: 3 10*3/uL (ref 1.4–6.5)
Neutrophils Relative %: 63 %
Platelets: 244 10*3/uL (ref 150–440)
RBC: 3.93 MIL/uL — ABNORMAL LOW (ref 4.40–5.90)
RDW: 16.2 % — ABNORMAL HIGH (ref 11.5–14.5)
WBC: 4.7 10*3/uL (ref 3.8–10.6)

## 2016-02-24 LAB — BASIC METABOLIC PANEL
Anion gap: 6 (ref 5–15)
BUN: 13 mg/dL (ref 6–20)
CO2: 27 mmol/L (ref 22–32)
Calcium: 9.5 mg/dL (ref 8.9–10.3)
Chloride: 100 mmol/L — ABNORMAL LOW (ref 101–111)
Creatinine, Ser: 0.72 mg/dL (ref 0.61–1.24)
GFR calc Af Amer: >60 mL/min (ref >60)
GFR calc non Af Amer: >60 mL/min (ref >60)
Glucose, Bld: 116 mg/dL — ABNORMAL HIGH (ref 65–99)
Potassium: 4 mmol/L (ref 3.5–5.1)
Sodium: 133 mmol/L — ABNORMAL LOW (ref 135–145)

## 2016-02-24 NOTE — Progress Notes (Signed)
[facility] day: 02/24/2016   Chief Complaint: Kenneth Luna. is a 82 y.o. male with rectal cancer s/p neoadjuvant Xeloda and radiation who is seen for assessment on Xeloda.  HPI:  The patient was last seen in the medical oncology clinic on 02/01/2016.  At that time, he was doing well s/p robotic assisted APR with end colostomy at [facility] on 12/17/2015 at [facility].  Pathology revealed ypT4bN0M0 (stage IIC) invasive moderately differentiated adenocarcinoma.  Levator muscle was involved.  We discussed adjuvant Xeloda.  Labs at last visit included a hematocrit 30.3, hemoglobin 10.2, MCV 80.9, platelets 256,000, white count 4800 with an ANC of 3000.  Comprehensive metabolic panel was normal with a creatinine of 0.78.  Magnesium was 2.0. CEA was 2.6.  During the interim, he notes no issues.  He is currently on day 10 of cycle #1 Xeloda.  He notes a little oval rash on the side of his neck which came up 4 days after starting Xeloda.  He denies any mouth sores or tenderness, nausea, vomiting, diarrhea, or hand foot syndrome.     Past Medical History:  Diagnosis Date  . Allergic rhinitis   . Anemia   . B12 deficiency 06/29/2015  . Benign neoplasm of ascending colon   . Benign neoplasm of descending colon   . Colon polyp   . DD (diverticular disease) 06/29/2015  . Dementia    memory issues  . Fatty liver   . GERD (gastroesophageal reflux disease)   . Glaucoma   . Hyperlipidemia   . Hypertension   . Rectal adenocarcinoma ([place]) 07/15/2015  . Substance abuse    alcohol  . Traumatic intraparenchymal hemorrhage [facility])     Past Surgical History:  Procedure Laterality Date  . CATARACT EXTRACTION W/ INTRAOCULAR LENS IMPLANT    . COLONOSCOPY WITH PROPOFOL N/A 07/10/2015   Procedure: COLONOSCOPY WITH PROPOFOL;  Surgeon: Lucilla Lame, MD;  Location: [facility];  Service: Endoscopy;  Laterality: N/A;  PER CAREGIVER WAS TOLD THAT PT WOULD BE 1ST (KEEP  PT EARLY AM)  . POLYPECTOMY  07/10/2015   Procedure: POLYPECTOMY;  Surgeon: Lucilla Lame, MD;  Location: [facility];  Service: Endoscopy;;    Family History  Problem Relation Age of Onset  . Cancer Father     Prostate    Social History:  reports that he quit smoking about 37 years ago. His smoking use included Pipe. He has never used smokeless tobacco. He reports that he drinks about 16.8 oz of alcohol per week . He reports that he does not use drugs.  He drinks 3 glasses of wine a day.  The patient is widowed.  He has no children.  The patient is accompanied by his caregiver, [name], today.  Allergies: No Known Allergies  Current Medications: Current Outpatient Prescriptions  Medication Sig Dispense Refill  . brimonidine-timolol (COMBIGAN) 0.2-0.5 % ophthalmic solution Place 1 drop into both eyes 2 (two) times daily.    . capecitabine (XELODA) 500 MG tablet Take 3 tablets (1,500 mg total) by mouth 2 (two) times daily after a meal. Take on days 1-14. Repeat every 21 days. 84 tablet 1  . cetirizine (ZYRTEC) 10 MG tablet Take 10 mg by mouth daily.     . cholecalciferol (VITAMIN D) 1000 units tablet Take 1,000 Units by mouth daily.    . clobetasol cream (TEMOVATE) 0.05 % Apply topically.    . docusate sodium (COLACE) 100 MG capsule Take 100  mg by mouth daily.    Marland Kitchen donepezil (ARICEPT) 10 MG tablet Take 10 mg by mouth at bedtime.    . fluticasone (FLONASE) 50 MCG/ACT nasal spray Place 2 sprays into both nostrils daily as needed for rhinitis.     . folic acid (FOLVITE) 1 MG tablet Take 1 tablet (1 mg total) by mouth daily. 30 tablet 2  . furosemide (LASIX) 20 MG tablet Take by mouth.    . gabapentin (NEURONTIN) 100 MG capsule     . hydrocortisone (ANUSOL-HC) 25 MG suppository Place 1 suppository (25 mg total) rectally 2 (two) times daily. 12 suppository 0  . meloxicam (MOBIC) 15 MG tablet Take 15 mg by mouth.    . metoprolol succinate (TOPROL XL) 25 MG 24 hr tablet Take 1  tablet (25 mg total) by mouth daily. 30 tablet 0  . montelukast (SINGULAIR) 10 MG tablet Take 10 mg by mouth at bedtime. Reported on 08/21/2015    . mupirocin ointment (BACTROBAN) 2 % Apply 1 application topically daily as needed.   0  . omeprazole (PRILOSEC) 20 MG capsule Take 1 capsule (20 mg total) by mouth daily. 30 capsule 0  . ondansetron (ZOFRAN) 8 MG tablet Take 1 tablet (8 mg total) by mouth every 8 (eight) hours as needed for nausea or vomiting. 20 tablet 1  . ondansetron (ZOFRAN) 8 MG tablet Take 1 tablet (8 mg total) by mouth 2 (two) times daily as needed (Nausea or vomiting). 30 tablet 1  . thiamine 100 MG tablet Take 1 tablet (100 mg total) by mouth daily. 30 tablet 2  . valsartan (DIOVAN) 80 MG tablet Take 80 mg by mouth daily.    . vitamin B-12 (CYANOCOBALAMIN) 1000 MCG tablet Take 1,000 mcg by mouth daily.    Marland Kitchen lidocaine (XYLOCAINE) 5 % ointment   0   No current facility-administered medications for this visit.     Review of Systems:  GENERAL:  Feels good.  No fevers or sweats .  Weight up 1 pound. PERFORMANCE STATUS (ECOG):  2 HEENT: No visual changes, sore throat, mouth sores or tenderness. Lungs: No shortness of breath or cough.  No hemoptysis. Cardiac:  No chest pain, palpitations, orthopnea, or PND. GI:  Ostomy working well.  No nausea, vomiting, diarrhea, or constipation. GU:  No urgency, frequency, dysuria, or hematuria. Musculoskeletal:  Chronic back pain secondary to T12 compression fracture.  No pain or muscle tenderness. Extremities:  No pain or swelling. Skin:  Little rash on face.  Neuro:  Mild dementia.  Balance and coordination off (chronic).  No headache, numbness or weakness. Endocrine:  No diabetes, thyroid issues, hot flashes or night sweats. Psych:  No mood changes, depression or anxiety. Pain: Mild rectal pain. Review of systems:  All other systems reviewed and found to be negative.  Physical Exam: Blood pressure 121/70, pulse 65, temperature (!)  96.6 F (35.9 C), temperature source Tympanic, resp. rate 18, weight 133 lb 13.1 oz (60.7 kg). GENERAL:  Well developed, well nourished, elderly gentleman sitting comfortably in the exam room in no acute distress.  He has a 4 point cane at his side MENTAL STATUS:  Alert and oriented to person, place and time. HEAD: Pearline Cables hair.  Normocephalic, atraumatic, face symmetric, no Cushingoid features. EYES:  Glasses.  Blue eyes.  Pupils equal round and reactive to light and accomodation.  No conjunctivitis or scleral icterus. ENT:  Oropharynx clear without lesion.  Tongue normal.  Mucous membranes moist.  RESPIRATORY:  Clear to auscultation  without rales, wheezes or rhonchi. CARDIOVASCULAR:  Regular rate and rhythm without murmur, rub or gallop. ABDOMEN:  Ostomy.  Soft, non-tender, with active bowel sounds, and no hepatosplenomegaly.  No masses. SKIN:  Small oval erythema of erythema left neck which appears to be a healing abrasion (? s/p shaving).  EXTREMITIES:  No hand-foot syndrome.  No edema, no skin discoloration or tenderness.  No palpable cords. LYMPH NODES: No palpable cervical, supraclavicular, axillary or inguinal adenopathy  NEUROLOGICAL: Unremarkable. PSYCH:  Appropriate.   Appointment on 02/24/2016  Component Date Value Ref Range Status  . WBC 02/24/2016 4.7  3.8 - 10.6 K/uL Final  . RBC 02/24/2016 3.93* 4.40 - 5.90 MIL/uL Final  . Hemoglobin 02/24/2016 9.9* 13.0 - 18.0 g/dL Final  . HCT 02/24/2016 30.3* 40.0 - 52.0 % Final  . MCV 02/24/2016 77.1* 80.0 - 100.0 fL Final  . MCH 02/24/2016 25.1* 26.0 - 34.0 pg Final  . MCHC 02/24/2016 32.6  32.0 - 36.0 g/dL Final  . RDW 02/24/2016 16.2* 11.5 - 14.5 % Final  . Platelets 02/24/2016 244  150 - 440 K/uL Final  . Neutrophils Relative % 02/24/2016 63  % Final  . Neutro Abs 02/24/2016 3.0  1.4 - 6.5 K/uL Final  . Lymphocytes Relative 02/24/2016 21  % Final  . Lymphs Abs 02/24/2016 1.0  1.0 - 3.6 K/uL Final  . Monocytes Relative 02/24/2016  10  % Final  . Monocytes Absolute 02/24/2016 0.5  0.2 - 1.0 K/uL Final  . Eosinophils Relative 02/24/2016 5  % Final  . Eosinophils Absolute 02/24/2016 0.2  0 - 0.7 K/uL Final  . Basophils Relative 02/24/2016 1  % Final  . Basophils Absolute 02/24/2016 0.0  0 - 0.1 K/uL Final  . Sodium 02/24/2016 133* 135 - 145 mmol/L Final  . Potassium 02/24/2016 4.0  3.5 - 5.1 mmol/L Final  . Chloride 02/24/2016 100* 101 - 111 mmol/L Final  . CO2 02/24/2016 27  22 - 32 mmol/L Final  . Glucose, Bld 02/24/2016 116* 65 - 99 mg/dL Final  . BUN 02/24/2016 13  6 - 20 mg/dL Final  . Creatinine, Ser 02/24/2016 0.72  0.61 - 1.24 mg/dL Final  . Calcium 02/24/2016 9.5  8.9 - 10.3 mg/dL Final  . GFR calc non Af Amer 02/24/2016 >60  >60 mL/min Final  . GFR calc Af Amer 02/24/2016 >60  >60 mL/min Final   Comment: (NOTE) The eGFR has been calculated using the CKD EPI equation. This calculation has not been validated in all clinical situations. eGFR's persistently <60 mL/min signify possible Chronic Kidney Disease.   Georgiann Hahn gap 02/24/2016 6  5 - 15 Final    Assessment:  [name] is a 82 y.o. male with stage IIC (T4bN0M0) low rectal adenocarcinoma s/p neoadjuvant chemotherapy and radiation followed by surgery.  He presented with rectal bleeding.  Colonoscopy on 07/10/2015 revealed a 4 cm frond-like/villous ulcerated non-obstructing mass in the rectum.  Biopsy revealed adenocarcinoma.  Chest, abdomen, and pelvic CT scan on 07/27/2015 revealed a circumferential soft tissue mass within the low rectum. He was a T12 compression fracture.  CEA was 4.1 on 07/26/2015.  Flexible sigmoidoscopy and ultrasound on 08/19/2015 revealed a 3.0 x 1.4 cm hypoechoic non-circumferential mass was found in the rectum.  There was no sonographic evidence suggesting breakthrough of the muscularis propria with invasion into the perirectal fat.  No lymph nodes were seen in the perirectal region and in the left iliac region. Stage  was early T3uN0.  He completed 5 weeks of concurrent Xeloda and radiation (09/09/2015 - 10/22/2015).  He underwent robotic assisted APR with end colostomy at [facility] on 12/17/2015 at [facility].  Pathology revealed ypT4bN0M0 (stage IIC) invasive moderately differentiated adenocarcinoma.  Levator muscle was involved.  CEA has been followed:  4.1 on 07/26/2015 and 2.6 on 02/01/2016.  He was admitted to [facility] from 07/24/2015 - 07/30/2015 with acute right basal ganglia hemorrhage and left humeral head fracture s/p a fall on 07/21/2015.  He has 24 hour home care.  He is fairly independent with a walker.  He was admitted to [facility] in [place] from 08/08/2015 - 08/09/2015 with acute diverticulitis.  He was discharged home on Augmentin rather than Flagyl given his alcohol dependency.    He has a normocytic anemia. He takes oral B12 daily.  Diet is good.  He drinks alcohol daily (3 glasses of wine).  He has mild dementia.   He is currently day 10 of cycle #1 adjuvant Xeloda (planned 6 cycles).  He denies any symptoms.  Symptomatically, he notes a little rash on his neck.  Exam reveals a small area of abrasion (? from shaving).  Plan: 1.  Labs today:  CBC with diff, BMP. 2.  Complete cycle #1 Xeloda on 02/28/2016 (then 7 days off). 3.  Begin cycle #2 Xeloda on 03/07/2016. 4.  RTC in [place] on 03/07/2016 for labs (CBC with diff, CMP, Mg, B12, folate, ferritin, iron studies). 5.  RTC in [place] on 03/28/2016 for MD assessment, labs (CBC with diff, CMP), and initiation of cycle #3 Xeloda.   Lequita Asal, MD  02/24/2016, 3:55 PM

## 2016-02-24 NOTE — Progress Notes (Signed)
Patient has an area on his left neck that he is asking for clobetasol ointment prescription for.  Has used it in the past but is currently out.

## 2016-02-28 ENCOUNTER — Encounter: Payer: Self-pay | Admitting: Hematology and Oncology

## 2016-03-02 ENCOUNTER — Ambulatory Visit
Admission: RE | Admit: 2016-03-02 | Discharge: 2016-03-02 | Disposition: A | Discharge status: Home / Self Care | Payer: Medicare Other | Source: Ambulatory Visit | Attending: Radiation Oncology | Admitting: Radiation Oncology

## 2016-03-02 ENCOUNTER — Encounter: Payer: Self-pay | Admitting: Radiation Oncology

## 2016-03-02 VITALS — BP 100/65 | HR 71 | Temp 96.0°F | Wt 134.2 lb

## 2016-03-02 DIAGNOSIS — Z923 Personal history of irradiation: Secondary | ICD-10-CM | POA: Diagnosis not present

## 2016-03-02 DIAGNOSIS — C20 Malignant neoplasm of rectum: Secondary | ICD-10-CM | POA: Insufficient documentation

## 2016-03-02 DIAGNOSIS — Z9049 Acquired absence of other specified parts of digestive tract: Secondary | ICD-10-CM | POA: Diagnosis not present

## 2016-03-02 DIAGNOSIS — Z933 Colostomy status: Secondary | ICD-10-CM | POA: Insufficient documentation

## 2016-03-02 NOTE — Progress Notes (Signed)
Radiation Oncology Follow up Note  Name: Kenneth Luna.   Date:   03/02/2016 MRN:  [id number] DOB: 01-29-1933    This 82 y.o. male presents to the clinic today for 4 month follow-up status post new adjuvant chemoradiation for stage IIa rectal cancer.  REFERRING PROVIDER: Dion Body, MD  HPI: Patient is an 82 year old male now out 4 months having completed concurrent chemoradiation in a neoadjuvant mode for stage IIa (T3 N0 M0) adenocarcinoma the rectum. He is seen today in routine follow-up and is doing well. Patient has had. AP resection Resection with colostomy with surgical pathology showing residual viable invasive moderately differentiated adenocarcinoma with margins clear no tumor seen in 14 lymph nodes examined. At the present time he is doing well he's having no pain. His colostomy is functioning well. He otherwise is without complaint.  COMPLICATIONS OF TREATMENT: none  FOLLOW UP COMPLIANCE: keeps appointments   PHYSICAL EXAM:  BP 100/65   Pulse 71   Temp (!) 96 F (35.6 C)   Wt 134 lb 2.4 oz (60.9 kg)   BMI 22.32 kg/m  Abdomen is benign he has a well-functioning colostomy. Well-developed well-nourished patient in NAD. HEENT reveals PERLA, EOMI, discs not visualized.  Oral cavity is clear. No oral mucosal lesions are identified. Neck is clear without evidence of cervical or supraclavicular adenopathy. Lungs are clear to A&P. Cardiac examination is essentially unremarkable with regular rate and rhythm without murmur rub or thrill. Abdomen is benign with no organomegaly or masses noted. Motor sensory and DTR levels are equal and symmetric in the upper and lower extremities. Cranial nerves II through XII are grossly intact. Proprioception is intact. No peripheral adenopathy or edema is identified. No motor or sensory levels are noted. Crude visual fields are within normal range.  RADIOLOGY RESULTS: No current films for review  PLAN: Present time patient is doing  well status post AP resection with no evidence of lymph node involvement and clear margins. He has a well-functioning colostomy. I am please was overall progress. I've asked to see him back in 6 months for follow-up. Patient knows to call sooner with any concerns.  I would like to take this opportunity to thank you for allowing me to participate in the care of your patient.Armstead Peaks., MD

## 2016-03-03 ENCOUNTER — Telehealth: Payer: Self-pay | Admitting: *Deleted

## 2016-03-03 NOTE — Telephone Encounter (Signed)
Called Lab and spoke to [place] and dr Mike Gip released the order on a Sunday and it was aut cancel with lab. She re- entered the order for 11/20 that is the next time pt coming so  Everything is good.

## 2016-03-03 NOTE — Telephone Encounter (Signed)
-----   Message from Sabino Gasser, RN sent at 03/01/2016  2:36 PM EST ----- Regarding: lab Ferritin on this patient was released on Sunday 02/28/16 and [place] [facility] wanted to know why?  I told Tye [place] that I don't know this patient. I tried to help her the best I could.  However, it looks like a ferr was ordered for 11/20 visit.  If it's need to be done sooner, pls let someone know  Per Tye Maryland in [place] lab [id number]  Nira Conn, RN

## 2016-03-07 ENCOUNTER — Other Ambulatory Visit: Payer: Self-pay | Admitting: Hematology and Oncology

## 2016-03-07 ENCOUNTER — Telehealth: Payer: Self-pay | Admitting: *Deleted

## 2016-03-07 ENCOUNTER — Inpatient Hospital Stay: Payer: Medicare Other

## 2016-03-07 DIAGNOSIS — D509 Iron deficiency anemia, unspecified: Secondary | ICD-10-CM

## 2016-03-07 DIAGNOSIS — E538 Deficiency of other specified B group vitamins: Secondary | ICD-10-CM

## 2016-03-07 DIAGNOSIS — D649 Anemia, unspecified: Secondary | ICD-10-CM

## 2016-03-07 DIAGNOSIS — C20 Malignant neoplasm of rectum: Secondary | ICD-10-CM | POA: Diagnosis not present

## 2016-03-07 LAB — COMPREHENSIVE METABOLIC PANEL
ALT: 16 U/L — ABNORMAL LOW (ref 17–63)
AST: 30 U/L (ref 15–41)
Albumin: 4.1 g/dL (ref 3.5–5.0)
Alkaline Phosphatase: 57 U/L (ref 38–126)
Anion gap: 7 (ref 5–15)
BUN: 12 mg/dL (ref 6–20)
CO2: 28 mmol/L (ref 22–32)
Calcium: 10 mg/dL (ref 8.9–10.3)
Chloride: 103 mmol/L (ref 101–111)
Creatinine, Ser: 0.75 mg/dL (ref 0.61–1.24)
GFR calc Af Amer: >60 mL/min (ref >60)
GFR calc non Af Amer: >60 mL/min (ref >60)
Glucose, Bld: 104 mg/dL — ABNORMAL HIGH (ref 65–99)
Potassium: 4.3 mmol/L (ref 3.5–5.1)
Sodium: 138 mmol/L (ref 135–145)
Total Bilirubin: 0.7 mg/dL (ref 0.3–1.2)
Total Protein: 7.1 g/dL (ref 6.5–8.1)

## 2016-03-07 LAB — CBC WITH DIFFERENTIAL/PLATELET
Basophils Absolute: 0.1 10*3/uL (ref 0–0.1)
Basophils Relative: 2 %
Eosinophils Absolute: 0.2 10*3/uL (ref 0–0.7)
Eosinophils Relative: 5 %
HCT: 29.4 % — ABNORMAL LOW (ref 40.0–52.0)
Hemoglobin: 9.6 g/dL — ABNORMAL LOW (ref 13.0–18.0)
Lymphocytes Relative: 26 %
Lymphs Abs: 0.9 10*3/uL — ABNORMAL LOW (ref 1.0–3.6)
MCH: 25.4 pg — ABNORMAL LOW (ref 26.0–34.0)
MCHC: 32.6 g/dL (ref 32.0–36.0)
MCV: 78 fL — ABNORMAL LOW (ref 80.0–100.0)
Monocytes Absolute: 0.6 10*3/uL (ref 0.2–1.0)
Monocytes Relative: 19 %
Neutro Abs: 1.6 10*3/uL (ref 1.4–6.5)
Neutrophils Relative %: 48 %
Platelets: 213 10*3/uL (ref 150–440)
RBC: 3.77 MIL/uL — ABNORMAL LOW (ref 4.40–5.90)
RDW: 17.4 % — ABNORMAL HIGH (ref 11.5–14.5)
WBC: 3.2 10*3/uL — ABNORMAL LOW (ref 3.8–10.6)

## 2016-03-07 LAB — IRON AND TIBC
Iron: 25 ug/dL — ABNORMAL LOW (ref 45–182)
Saturation Ratios: 5 % — ABNORMAL LOW (ref 17.9–39.5)
TIBC: 469 ug/dL — ABNORMAL HIGH (ref 250–450)
UIBC: 444 ug/dL

## 2016-03-07 LAB — FOLATE: Folate: >47 ng/mL (ref >5.9)

## 2016-03-07 LAB — VITAMIN B12: Vitamin B-12: 501 pg/mL (ref 180–914)

## 2016-03-07 LAB — MAGNESIUM: Magnesium: 1.9 mg/dL (ref 1.7–2.4)

## 2016-03-07 LAB — FERRITIN: Ferritin: 13 ng/mL — ABNORMAL LOW (ref 24–336)

## 2016-03-07 NOTE — Telephone Encounter (Signed)
Called patient's caregiver, Glenda and LVM  to ask if patient is taking oral iron.  Patient has iron deficient anemia.  If he is not taking oral iron, MD recommends IV iron X 3.  Awaiting return call.

## 2016-03-07 NOTE — Telephone Encounter (Signed)
Glenda called and left message that pt not on oral iron. I called her back to see if the patient has ever been on oral iron and she states no he has never been. I will check with doctor to see if she wants him to go on oral iron or IV iron and call Glenda back and she is agreeable to the plan

## 2016-03-07 NOTE — Telephone Encounter (Signed)
Glenda called back and said that his week off xeloda was 11/13 through 11/19 and he started back on xeloda today after his blood work this am.  So today is day 1 of his cycle

## 2016-03-07 NOTE — Telephone Encounter (Signed)
  We can try oral iron first.  Start with ferrous sulfate 1 pill a day with OJ or vitamin C.  If tolerates, advance to 1 tablet twice a day.   If unable to tolerate, we will go to IV iron.  M

## 2016-03-07 NOTE — Telephone Encounter (Signed)
-----   Message from Lequita Asal, MD sent at 03/07/2016 12:56 PM EST ----- Regarding: Please call caregiver  Patient has iron deficiency anemia.  I thought he was taking oral iron.  We may need IV iron.  Venofer x 3 orders in.  M  ----- Message ----- From: Interface, Lab In [place] Sent: 03/07/2016   9:00 AM To: Lequita Asal, MD

## 2016-03-07 NOTE — Telephone Encounter (Signed)
Called patient's caregiver, Glenda and LVM to ask where patient is in his Xeloda cycle.  Asked her to call back with his start date.

## 2016-03-07 NOTE — Telephone Encounter (Signed)
-----   Message from Lequita Asal, MD sent at 03/07/2016  9:09 AM EST ----- Regarding: Please call patient  Counts ok.  Where are we in the Xeloda cycle?  M  ----- Message ----- From: Interface, Lab In [place] Sent: 03/07/2016   9:00 AM To: Lequita Asal, MD

## 2016-03-07 NOTE — Telephone Encounter (Signed)
-----   Message from Lequita Asal, MD sent at 03/07/2016 12:56 PM EST ----- Regarding: Please call caregiver  Patient has iron deficiency anemia.  I thought he was taking oral iron.  We may need IV iron.  Venofer x 3 orders in.  M  ----- Message ----- From: Interface, Lab In [place] Sent: 03/07/2016   9:00 AM To: Lequita Asal, MD

## 2016-03-07 NOTE — Telephone Encounter (Signed)
-----   Message from Lequita Asal, MD sent at 03/07/2016  9:09 AM EST ----- Regarding: Please call patient  Counts ok.  Where are we in the Xeloda cycle?  M  ----- Message ----- From: Interface, Lab In [place] Sent: 03/07/2016   9:00 AM To: Lequita Asal, MD

## 2016-03-08 NOTE — Telephone Encounter (Signed)
Called Kenneth Luna to inform her that MD recommends patient start an oral iron pill once a day to be taken with OJ or Vitamin C.  Patient cannot drink orange juice so she will purchase Vitamin C.  If patient tolerates well, advance to one pill twice a day.  If patient cannot tolerate she is advised to call us and we can give IV iron.  Verbalized understanding.

## 2016-03-26 ENCOUNTER — Other Ambulatory Visit: Payer: Self-pay | Admitting: Hematology and Oncology

## 2016-03-26 NOTE — Progress Notes (Signed)
[facility] day: 03/28/2016   Chief Complaint: Kenneth Luna. is a 82 y.o. male with rectal cancer s/p neoadjuvant Xeloda and radiation who is seen for assessment prior to cycle #3 Xeloda.  HPI:  The patient was last seen in the medical oncology clinic on 02/24/2016.  At that time, he was on day 10 of cycle #1 Xeloda.  He denied any side effects associated with Xeloda.  He had a small oval rash on his neck from shaving.  Counts included a hematocrit 30.3, hemoglobin 9.9, MCV 77.1, platelets 244,000, white count 4700 with an ANC of 3000.  He began cycle #2 Xeloda on 03/07/2016. Counts at that time included a hematocrit 29.4, hemoglobin 9.6, MCV 78, platelets 13,000, white count 3200 with an ANC of 1600.  Anemia workup at that time included a ferritin of 13, iron saturation 5% and TIBC 469 (high). B12 was 501. Folate was > 47.  Symptomatically, he notes a little discomfort (pain/itching) in his feet which started 2 days ago.  He notes a little lower extremity swelling for which he takes Lasix prn.  He denies any symptoms in his hands.  He is using heavy cream on his hands and feet.  He denies any diarrhea.  He starts cycle #3 Xeloda tomorrow.  He is taking oral iron BID.   Past Medical History:  Diagnosis Date  . Allergic rhinitis   . Anemia   . B12 deficiency 06/29/2015  . Benign neoplasm of ascending colon   . Benign neoplasm of descending colon   . Colon polyp   . DD (diverticular disease) 06/29/2015  . Dementia    memory issues  . Fatty liver   . GERD (gastroesophageal reflux disease)   . Glaucoma   . Hyperlipidemia   . Hypertension   . Rectal adenocarcinoma ([place]) 07/15/2015  . Substance abuse    alcohol  . Traumatic intraparenchymal hemorrhage [facility])     Past Surgical History:  Procedure Laterality Date  . CATARACT EXTRACTION W/ INTRAOCULAR LENS IMPLANT    . COLONOSCOPY WITH PROPOFOL N/A 07/10/2015   Procedure: COLONOSCOPY  WITH PROPOFOL;  Surgeon: Lucilla Lame, MD;  Location: [facility];  Service: Endoscopy;  Laterality: N/A;  PER CAREGIVER WAS TOLD THAT PT WOULD BE 1ST (KEEP PT EARLY AM)  . POLYPECTOMY  07/10/2015   Procedure: POLYPECTOMY;  Surgeon: Lucilla Lame, MD;  Location: [facility];  Service: Endoscopy;;    Family History  Problem Relation Age of Onset  . Cancer Father     Prostate    Social History:  reports that he quit smoking about 37 years ago. His smoking use included Pipe. He has never used smokeless tobacco. He reports that he drinks about 16.8 oz of alcohol per week . He reports that he does not use drugs.  He drinks 3 glasses of wine a day.  The patient is widowed.  He has no children.  The patient is accompanied by his caregiver, [name], today.  Allergies: No Known Allergies  Current Medications: Current Outpatient Prescriptions  Medication Sig Dispense Refill  . brimonidine-timolol (COMBIGAN) 0.2-0.5 % ophthalmic solution Place 1 drop into both eyes 2 (two) times daily.    . capecitabine (XELODA) 500 MG tablet Take 3 tablets (1,500 mg total) by mouth 2 (two) times daily after a meal. Take on days 1-14. Repeat every 21 days. 84 tablet 1  . cetirizine (ZYRTEC) 10 MG tablet Take 10 mg by mouth  daily.     . cholecalciferol (VITAMIN D) 1000 units tablet Take 1,000 Units by mouth daily.    . clobetasol cream (TEMOVATE) 0.05 % Apply topically.    . docusate sodium (COLACE) 100 MG capsule Take 100 mg by mouth daily.    Marland Kitchen donepezil (ARICEPT) 10 MG tablet Take 10 mg by mouth at bedtime.    . fluticasone (FLONASE) 50 MCG/ACT nasal spray Place 2 sprays into both nostrils daily as needed for rhinitis.     . folic acid (FOLVITE) 1 MG tablet Take 1 tablet (1 mg total) by mouth daily. 30 tablet 2  . gabapentin (NEURONTIN) 100 MG capsule     . hydrocortisone (ANUSOL-HC) 25 MG suppository Place 1 suppository (25 mg total) rectally 2 (two) times daily. 12 suppository 0  .  Iron-Vitamin C (IRON 100/C PO) Take by mouth.    . lidocaine (XYLOCAINE) 5 % ointment   0  . meloxicam (MOBIC) 15 MG tablet Take 15 mg by mouth.    . metoprolol succinate (TOPROL XL) 25 MG 24 hr tablet Take 1 tablet (25 mg total) by mouth daily. 30 tablet 0  . montelukast (SINGULAIR) 10 MG tablet Take 10 mg by mouth at bedtime. Reported on 08/21/2015    . mupirocin ointment (BACTROBAN) 2 % Apply 1 application topically daily as needed.   0  . omeprazole (PRILOSEC) 20 MG capsule Take 1 capsule (20 mg total) by mouth daily. 30 capsule 0  . ondansetron (ZOFRAN) 8 MG tablet Take 1 tablet (8 mg total) by mouth every 8 (eight) hours as needed for nausea or vomiting. 20 tablet 1  . ondansetron (ZOFRAN) 8 MG tablet Take 1 tablet (8 mg total) by mouth 2 (two) times daily as needed (Nausea or vomiting). 30 tablet 1  . thiamine 100 MG tablet Take 1 tablet (100 mg total) by mouth daily. 30 tablet 2  . valsartan (DIOVAN) 80 MG tablet Take 80 mg by mouth daily.    . vitamin B-12 (CYANOCOBALAMIN) 1000 MCG tablet Take 1,000 mcg by mouth daily.    . furosemide (LASIX) 20 MG tablet Take by mouth.     No current facility-administered medications for this visit.     Review of Systems:  GENERAL:  Feels good.  No fevers or sweats .  Weight up 5 pounds. PERFORMANCE STATUS (ECOG):  2 HEENT: No visual changes, sore throat, mouth sores or tenderness. Lungs: No shortness of breath or cough.  No hemoptysis. Cardiac:  No chest pain, palpitations, orthopnea, or PND. GI:  Ostomy.  No nausea, vomiting, diarrhea, or constipation. GU:  No urgency, frequency, dysuria, or hematuria. Musculoskeletal:  Chronic back pain secondary to T12 compression fracture.  No pain or muscle tenderness. Extremities:  Little discomfort (pain/itching) in his feet (right > left).  Mild lower extremity swelling (takes Lasix prn). Skin:  No rashes, ulcers or skin changes.  Neuro:  Mild dementia.  Balance and coordination off (chronic).  No  headache, numbness or weakness. Endocrine:  No diabetes, thyroid issues, hot flashes or night sweats. Psych:  No mood changes, depression or anxiety. Pain: Mild rectal pain. Review of systems:  All other systems reviewed and found to be negative.  Physical Exam: Blood pressure 124/71, pulse (!) 56, temperature (!) 95.3 F (35.2 C), resp. rate 18, weight 138 lb 0.1 oz (62.6 kg). GENERAL:  Well developed, well nourished, elderly gentleman sitting comfortably in the exam room in no acute distress.  He has a 4 point cane at his  side MENTAL STATUS:  Alert and oriented to person, place and time. HEAD: Pearline Cables hair.  Normocephalic, atraumatic, face symmetric, no Cushingoid features. EYES:  Glasses.  Blue eyes.  Pupils equal round and reactive to light and accomodation.  No conjunctivitis or scleral icterus. ENT:  Oropharynx clear without lesion.  Tongue normal.  Mucous membranes moist.  RESPIRATORY:  Clear to auscultation without rales, wheezes or rhonchi. CARDIOVASCULAR:  Regular rate and rhythm without murmur, rub or gallop. ABDOMEN:  Ostomy.  Soft, non-tender, with active bowel sounds, and no hepatosplenomegaly.  No masses. SKIN:  Hands unremarkable.  Feet dry with some cracking of skin (right foot). EXTREMITIES:  Possibly early grade I hand-foot syndrome.  No edema, no skin discoloration or tenderness.  No palpable cords. LYMPH NODES: No palpable cervical, supraclavicular, axillary or inguinal adenopathy  NEUROLOGICAL: Unremarkable. PSYCH:  Appropriate.   Office Visit on 03/28/2016  Component Date Value Ref Range Status  . WBC 03/28/2016 3.8  3.8 - 10.6 K/uL Final  . RBC 03/28/2016 3.54* 4.40 - 5.90 MIL/uL Final  . Hemoglobin 03/28/2016 9.7* 13.0 - 18.0 g/dL Final  . HCT 03/28/2016 29.6* 40.0 - 52.0 % Final  . MCV 03/28/2016 83.4  80.0 - 100.0 fL Final  . MCH 03/28/2016 27.4  26.0 - 34.0 pg Final  . MCHC 03/28/2016 32.9  32.0 - 36.0 g/dL Final  . RDW 03/28/2016 18.7* 11.5 - 14.5 % Final   . Platelets 03/28/2016 220  150 - 440 K/uL Final  . Neutrophils Relative % 03/28/2016 58  % Final  . Neutro Abs 03/28/2016 2.2  1.4 - 6.5 K/uL Final  . Lymphocytes Relative 03/28/2016 20  % Final  . Lymphs Abs 03/28/2016 0.8* 1.0 - 3.6 K/uL Final  . Monocytes Relative 03/28/2016 14  % Final  . Monocytes Absolute 03/28/2016 0.5  0.2 - 1.0 K/uL Final  . Eosinophils Relative 03/28/2016 6  % Final  . Eosinophils Absolute 03/28/2016 0.2  0 - 0.7 K/uL Final  . Basophils Relative 03/28/2016 2  % Final  . Basophils Absolute 03/28/2016 0.1  0 - 0.1 K/uL Final  . Sodium 03/28/2016 135  135 - 145 mmol/L Final  . Potassium 03/28/2016 3.9  3.5 - 5.1 mmol/L Final  . Chloride 03/28/2016 103  101 - 111 mmol/L Final  . CO2 03/28/2016 28  22 - 32 mmol/L Final  . Glucose, Bld 03/28/2016 110* 65 - 99 mg/dL Final  . BUN 03/28/2016 13  6 - 20 mg/dL Final  . Creatinine, Ser 03/28/2016 0.66  0.61 - 1.24 mg/dL Final  . Calcium 03/28/2016 9.5  8.9 - 10.3 mg/dL Final  . Total Protein 03/28/2016 6.4* 6.5 - 8.1 g/dL Final  . Albumin 03/28/2016 3.8  3.5 - 5.0 g/dL Final  . AST 03/28/2016 31  15 - 41 U/L Final  . ALT 03/28/2016 16* 17 - 63 U/L Final  . Alkaline Phosphatase 03/28/2016 54  38 - 126 U/L Final  . Total Bilirubin 03/28/2016 0.5  0.3 - 1.2 mg/dL Final  . GFR calc non Af Amer 03/28/2016 >60  >60 mL/min Final  . GFR calc Af Amer 03/28/2016 >60  >60 mL/min Final   Comment: (NOTE) The eGFR has been calculated using the CKD EPI equation. This calculation has not been validated in all clinical situations. eGFR's persistently <60 mL/min signify possible Chronic Kidney Disease.   Georgiann Hahn gap 03/28/2016 4* 5 - 15 Final    Assessment:  [name] is a 82 y.o. male with  stage IIC (T4bN0M0) low rectal adenocarcinoma s/p neoadjuvant chemotherapy and radiation followed by surgery.  He presented with rectal bleeding.  Colonoscopy on 07/10/2015 revealed a 4 cm frond-like/villous ulcerated  non-obstructing mass in the rectum.  Biopsy revealed adenocarcinoma.  Chest, abdomen, and pelvic CT scan on 07/27/2015 revealed a circumferential soft tissue mass within the low rectum. He was a T12 compression fracture.  CEA was 4.1 on 07/26/2015.  Flexible sigmoidoscopy and ultrasound on 08/19/2015 revealed a 3.0 x 1.4 cm hypoechoic non-circumferential mass was found in the rectum.  There was no sonographic evidence suggesting breakthrough of the muscularis propria with invasion into the perirectal fat.  No lymph nodes were seen in the perirectal region and in the left iliac region. Stage was early T3uN0.  He completed 5 weeks of concurrent Xeloda and radiation (09/09/2015 - 10/22/2015).  He underwent robotic assisted APR with end colostomy at [facility] on 12/17/2015 at [facility].  Pathology revealed [id number] (stage IIC) invasive moderately differentiated adenocarcinoma.  Levator muscle was involved.  CEA has been followed:  4.1 on 07/26/2015 and 2.6 on 02/01/2016.  He was admitted to [facility] from 07/24/2015 - 07/30/2015 with acute right basal ganglia hemorrhage and left humeral head fracture s/p a fall on 07/21/2015.  He has 24 hour home care.  He is fairly independent with a walker.  He was admitted to [facility] in [place] from 08/08/2015 - 08/09/2015 with acute diverticulitis.  He was discharged home on Augmentin rather than Flagyl given his alcohol dependency.    He has iron deficiency anemia. Anemia workup on 03/07/2016 revealed a ferritin of 13, iron saturation 5% and TIBC 469 (high).  B12 was 501. Folate was > 47. He takes oral B12 daily.  Diet is good.  He drinks alcohol daily (3 glasses of wine).  He has mild dementia.   He is s/p 2 cycles of adjuvant Xeloda (02/15/2016 - 03/07/2016) for planned 6 cycles.  He denies any symptoms.  Symptomatically, he has early grade I hand foot syndrome.  Plan: 1.  Labs today:  CBC with diff, CMP. 2.  Discuss iron deficiency.  Discuss taking oral iron BID.   Discuss iron rich foods.  Discuss IV iron if no improvement. 3.  Discuss possible early grade I hand foot syndrome.  Discuss use of heavy cream.  Discuss contacting office if symptoms worsen (may need to hold Xeloda and/or decrease dose). 4.  Begin cycle #3 Xeloda on 03/29/2016. 5.  RTC in [place] in 3 weeks for MD assessment, labs (CBC with diff, CMP, Mg, ferritin), and+/- Venofer.   Lequita Asal, MD  03/28/2016, 8:58 AM

## 2016-03-28 ENCOUNTER — Inpatient Hospital Stay: Payer: Medicare Other | Attending: Hematology and Oncology | Admitting: Hematology and Oncology

## 2016-03-28 ENCOUNTER — Encounter: Payer: Self-pay | Admitting: Hematology and Oncology

## 2016-03-28 VITALS — BP 124/71 | HR 56 | Temp 95.3°F | Resp 18 | Wt 138.0 lb

## 2016-03-28 DIAGNOSIS — D509 Iron deficiency anemia, unspecified: Secondary | ICD-10-CM | POA: Insufficient documentation

## 2016-03-28 DIAGNOSIS — E785 Hyperlipidemia, unspecified: Secondary | ICD-10-CM

## 2016-03-28 DIAGNOSIS — C20 Malignant neoplasm of rectum: Secondary | ICD-10-CM | POA: Diagnosis not present

## 2016-03-28 DIAGNOSIS — Z933 Colostomy status: Secondary | ICD-10-CM | POA: Diagnosis not present

## 2016-03-28 DIAGNOSIS — Z923 Personal history of irradiation: Secondary | ICD-10-CM | POA: Insufficient documentation

## 2016-03-28 DIAGNOSIS — Z79899 Other long term (current) drug therapy: Secondary | ICD-10-CM | POA: Diagnosis not present

## 2016-03-28 DIAGNOSIS — I10 Essential (primary) hypertension: Secondary | ICD-10-CM | POA: Diagnosis not present

## 2016-03-28 DIAGNOSIS — Z9221 Personal history of antineoplastic chemotherapy: Secondary | ICD-10-CM | POA: Diagnosis not present

## 2016-03-28 DIAGNOSIS — E538 Deficiency of other specified B group vitamins: Secondary | ICD-10-CM | POA: Insufficient documentation

## 2016-03-28 DIAGNOSIS — L299 Pruritus, unspecified: Secondary | ICD-10-CM | POA: Diagnosis not present

## 2016-03-28 DIAGNOSIS — F1020 Alcohol dependence, uncomplicated: Secondary | ICD-10-CM | POA: Insufficient documentation

## 2016-03-28 DIAGNOSIS — R600 Localized edema: Secondary | ICD-10-CM | POA: Insufficient documentation

## 2016-03-28 DIAGNOSIS — Z8781 Personal history of (healed) traumatic fracture: Secondary | ICD-10-CM

## 2016-03-28 DIAGNOSIS — K219 Gastro-esophageal reflux disease without esophagitis: Secondary | ICD-10-CM | POA: Insufficient documentation

## 2016-03-28 DIAGNOSIS — Z87891 Personal history of nicotine dependence: Secondary | ICD-10-CM | POA: Diagnosis not present

## 2016-03-28 DIAGNOSIS — R21 Rash and other nonspecific skin eruption: Secondary | ICD-10-CM | POA: Insufficient documentation

## 2016-03-28 DIAGNOSIS — F0390 Unspecified dementia without behavioral disturbance: Secondary | ICD-10-CM | POA: Diagnosis not present

## 2016-03-28 DIAGNOSIS — Z86010 Personal history of colonic polyps: Secondary | ICD-10-CM | POA: Insufficient documentation

## 2016-03-28 DIAGNOSIS — L271 Localized skin eruption due to drugs and medicaments taken internally: Secondary | ICD-10-CM

## 2016-03-28 DIAGNOSIS — Z87311 Personal history of (healed) other pathological fracture: Secondary | ICD-10-CM | POA: Diagnosis not present

## 2016-03-28 LAB — COMPREHENSIVE METABOLIC PANEL
ALT: 16 U/L — ABNORMAL LOW (ref 17–63)
AST: 31 U/L (ref 15–41)
Albumin: 3.8 g/dL (ref 3.5–5.0)
Alkaline Phosphatase: 54 U/L (ref 38–126)
Anion gap: 4 — ABNORMAL LOW (ref 5–15)
BUN: 13 mg/dL (ref 6–20)
CO2: 28 mmol/L (ref 22–32)
Calcium: 9.5 mg/dL (ref 8.9–10.3)
Chloride: 103 mmol/L (ref 101–111)
Creatinine, Ser: 0.66 mg/dL (ref 0.61–1.24)
GFR calc Af Amer: >60 mL/min (ref >60)
GFR calc non Af Amer: >60 mL/min (ref >60)
Glucose, Bld: 110 mg/dL — ABNORMAL HIGH (ref 65–99)
Potassium: 3.9 mmol/L (ref 3.5–5.1)
Sodium: 135 mmol/L (ref 135–145)
Total Bilirubin: 0.5 mg/dL (ref 0.3–1.2)
Total Protein: 6.4 g/dL — ABNORMAL LOW (ref 6.5–8.1)

## 2016-03-28 LAB — CBC WITH DIFFERENTIAL/PLATELET
Basophils Absolute: 0.1 10*3/uL (ref 0–0.1)
Basophils Relative: 2 %
Eosinophils Absolute: 0.2 10*3/uL (ref 0–0.7)
Eosinophils Relative: 6 %
HCT: 29.6 % — ABNORMAL LOW (ref 40.0–52.0)
Hemoglobin: 9.7 g/dL — ABNORMAL LOW (ref 13.0–18.0)
Lymphocytes Relative: 20 %
Lymphs Abs: 0.8 10*3/uL — ABNORMAL LOW (ref 1.0–3.6)
MCH: 27.4 pg (ref 26.0–34.0)
MCHC: 32.9 g/dL (ref 32.0–36.0)
MCV: 83.4 fL (ref 80.0–100.0)
Monocytes Absolute: 0.5 10*3/uL (ref 0.2–1.0)
Monocytes Relative: 14 %
Neutro Abs: 2.2 10*3/uL (ref 1.4–6.5)
Neutrophils Relative %: 58 %
Platelets: 220 10*3/uL (ref 150–440)
RBC: 3.54 MIL/uL — ABNORMAL LOW (ref 4.40–5.90)
RDW: 18.7 % — ABNORMAL HIGH (ref 11.5–14.5)
WBC: 3.8 10*3/uL (ref 3.8–10.6)

## 2016-03-28 NOTE — Progress Notes (Signed)
Patient states he is having some pain/itching on the bottoms of both his feet with the right one being worse.  Also has some bilateral lower extremity edema. Patient has lasix but has not been taking.  PCP told him to take on PRN basis.

## 2016-04-04 ENCOUNTER — Telehealth: Payer: Self-pay | Admitting: *Deleted

## 2016-04-04 NOTE — Telephone Encounter (Signed)
Called to report that he is having swelling in his legs bilaterally, pain in his feet and hands. She called specialty pharmacy and they told her it is most likely side effects of Xeloda. She is asking if we want to dose reduce him. Please advise.

## 2016-04-04 NOTE — Telephone Encounter (Signed)
Per Mike Gip, Hold Xeloda, if swelling up legs, will need doppler. I called and spoke with [facility] and she said the swelling is in his feet and hands mostly and that he has been taking his edema pills and wearing compression hose which is helping. She reports that his fingertips and feet are starting to peel. She will hold the Xeloda until he returns on 1/2

## 2016-04-17 DIAGNOSIS — L271 Localized skin eruption due to drugs and medicaments taken internally: Secondary | ICD-10-CM | POA: Insufficient documentation

## 2016-04-17 NOTE — Progress Notes (Signed)
[facility] day: 04/19/2016   Chief Complaint: Kenneth Luna. is a 82 y.o. male with rectal cancer s/p neoadjuvant Xeloda and radiation who is seen for assessment prior to cycle #4 Xeloda.  HPI:  The patient was last seen in the medical oncology clinic on 03/28/2016.  At that time, he had a early grade I hand foot syndrome (palmar plantar erythrodysesthesia).  Hands were normal.  He had a little bit of discomfort (pain/itching) in his feet.  Exam revealed dry feet (right > left).  He was using heavy cream.  He began cycle #3 Xeloda on 03/29/2016.  His caregiver called on 04/04/2016 noting pain in his hands and feet.  Fingertips and feet were starting to peel.  Xeloda was held.  Symptomatically, he notes that his hands are a little better.  Initially, he could barely walk secondary to foot pain with associated edema.  Day by day, off Xeloda, symptoms have improved.  His hands and feet have been peeling.  He describes itching on the bottom of his feet.  He is taking iron with OJ or vitamin C.  He denies any melena or hematochezia.   Past Medical History:  Diagnosis Date  . Allergic rhinitis   . Anemia   . B12 deficiency 06/29/2015  . Benign neoplasm of ascending colon   . Benign neoplasm of descending colon   . Colon polyp   . DD (diverticular disease) 06/29/2015  . Dementia    memory issues  . Fatty liver   . GERD (gastroesophageal reflux disease)   . Glaucoma   . Hyperlipidemia   . Hypertension   . Rectal adenocarcinoma ([name]) 07/15/2015  . Substance abuse    alcohol  . Traumatic intraparenchymal hemorrhage [facility])     Past Surgical History:  Procedure Laterality Date  . CATARACT EXTRACTION W/ INTRAOCULAR LENS IMPLANT    . COLONOSCOPY WITH PROPOFOL N/A 07/10/2015   Procedure: COLONOSCOPY WITH PROPOFOL;  Surgeon: Lucilla Lame, MD;  Location: [facility];  Service: Endoscopy;  Laterality: N/A;  PER CAREGIVER WAS TOLD THAT PT  WOULD BE 1ST (KEEP PT EARLY AM)  . POLYPECTOMY  07/10/2015   Procedure: POLYPECTOMY;  Surgeon: Lucilla Lame, MD;  Location: [place];  Service: Endoscopy;;    Family History  Problem Relation Age of Onset  . Cancer Father     Prostate    Social History:  reports that he quit smoking about 38 years ago. His smoking use included Pipe. He has never used smokeless tobacco. He reports that he drinks about 16.8 oz of alcohol per week . He reports that he does not use drugs.  He drinks 3 glasses of wine a day.  The patient is widowed.  He has no children.  The patient is accompanied by his caregiver, Kenneth Luna, today.  Allergies: No Known Allergies  Current Medications: Current Outpatient Prescriptions  Medication Sig Dispense Refill  . brimonidine-timolol (COMBIGAN) 0.2-0.5 % ophthalmic solution Place 1 drop into both eyes 2 (two) times daily.    . cetirizine (ZYRTEC) 10 MG tablet Take 10 mg by mouth daily.     . cholecalciferol (VITAMIN D) 1000 units tablet Take 1,000 Units by mouth daily.    . clobetasol cream (TEMOVATE) 0.05 % Apply topically.    . docusate sodium (COLACE) 100 MG capsule Take 100 mg by mouth daily.    Marland Kitchen donepezil (ARICEPT) 10 MG tablet Take 10 mg by mouth at bedtime.    Marland Kitchen  fluticasone (FLONASE) 50 MCG/ACT nasal spray Place 2 sprays into both nostrils daily as needed for rhinitis.     . folic acid (FOLVITE) 1 MG tablet Take 1 tablet (1 mg total) by mouth daily. 30 tablet 2  . furosemide (LASIX) 20 MG tablet Take by mouth.    . gabapentin (NEURONTIN) 100 MG capsule     . hydrocortisone (ANUSOL-HC) 25 MG suppository Place 1 suppository (25 mg total) rectally 2 (two) times daily. 12 suppository 0  . Iron-Vitamin C (IRON 100/C PO) Take by mouth.    . lidocaine (XYLOCAINE) 5 % ointment   0  . meloxicam (MOBIC) 15 MG tablet Take 15 mg by mouth.    . metoprolol succinate (TOPROL XL) 25 MG 24 hr tablet Take 1 tablet (25 mg total) by mouth daily. 30 tablet 0  .  montelukast (SINGULAIR) 10 MG tablet Take 10 mg by mouth at bedtime. Reported on 08/21/2015    . mupirocin ointment (BACTROBAN) 2 % Apply 1 application topically daily as needed.   0  . omeprazole (PRILOSEC) 20 MG capsule Take 1 capsule (20 mg total) by mouth daily. 30 capsule 0  . ondansetron (ZOFRAN) 8 MG tablet Take 1 tablet (8 mg total) by mouth every 8 (eight) hours as needed for nausea or vomiting. 20 tablet 1  . ondansetron (ZOFRAN) 8 MG tablet Take 1 tablet (8 mg total) by mouth 2 (two) times daily as needed (Nausea or vomiting). 30 tablet 1  . thiamine 100 MG tablet Take 1 tablet (100 mg total) by mouth daily. 30 tablet 2  . valsartan (DIOVAN) 80 MG tablet Take 80 mg by mouth daily.    . vitamin B-12 (CYANOCOBALAMIN) 1000 MCG tablet Take 1,000 mcg by mouth daily.    . capecitabine (XELODA) 500 MG tablet Take 3 tablets (1,500 mg total) by mouth 2 (two) times daily after a meal. Take on days 1-14. Repeat every 21 days. (Patient not taking: Reported on 04/19/2016) 84 tablet 1   No current facility-administered medications for this visit.     Review of Systems:  GENERAL:  Feels a little better.  No fevers or sweats .  Weight down 2 pounds. PERFORMANCE STATUS (ECOG):  2 HEENT: No visual changes, sore throat, mouth sores or tenderness. Lungs: No shortness of breath or cough.  No hemoptysis. Cardiac:  No chest pain, palpitations, orthopnea, or PND. GI:  Ostomy.  No nausea, vomiting, diarrhea, or constipation. GU:  No urgency, frequency, dysuria, or hematuria. Musculoskeletal:  Chronic back pain secondary to T12 compression fracture.  No pain or muscle tenderness. Extremities:  Little discomfort (pain/itching) in his feet.  Mild lower extremity swelling (takes Lasix prn). Skin:  Feet and hands peeling.  No rashes, ulcers or skin changes.  Neuro:  Mild dementia.  Balance and coordination off (chronic).  No headache, numbness or weakness. Endocrine:  No diabetes, thyroid issues, hot flashes or  night sweats. Psych:  No mood changes, depression or anxiety. Pain: Mild rectal pain. Review of systems:  All other systems reviewed and found to be negative.  Physical Exam: Blood pressure 118/61, pulse (!) 59, temperature (!) 94.7 F (34.8 C), temperature source Tympanic, resp. rate 18, weight 136 lb 3.9 oz (61.8 kg). GENERAL:  Well developed, well nourished, elderly gentleman sitting comfortably in the exam room in no acute distress.  He has a 4 point cane at his side MENTAL STATUS:  Alert and oriented to person, place and time. HEAD: Pearline Cables hair.  Normocephalic, atraumatic, face  symmetric, no Cushingoid features. EYES:  Glasses.  Blue eyes.  Pupils equal round and reactive to light and accomodation.  No conjunctivitis or scleral icterus. ENT:  Oropharynx clear without lesion.  Tongue normal.  Mucous membranes moist.  RESPIRATORY:  Clear to auscultation without rales, wheezes or rhonchi. CARDIOVASCULAR:  Regular rate and rhythm without murmur, rub or gallop. ABDOMEN:  Ostomy.  Soft, non-tender, with active bowel sounds, and no hepatosplenomegaly.  No masses. SKIN:  Hands peeling.  Feet dry with cracking of skin (right foot). EXTREMITIES:  Improving grade II hand-foot syndrome.  No edema, no skin discoloration or tenderness.  No palpable cords. LYMPH NODES: No palpable cervical, supraclavicular, axillary or inguinal adenopathy  NEUROLOGICAL: Unremarkable. PSYCH:  Appropriate.   Orders Only on 04/19/2016  Component Date Value Ref Range Status  . WBC 04/19/2016 5.0  3.8 - 10.6 K/uL Final  . RBC 04/19/2016 4.01* 4.40 - 5.90 MIL/uL Final  . Hemoglobin 04/19/2016 12.1* 13.0 - 18.0 g/dL Final  . HCT 04/19/2016 36.1* 40.0 - 52.0 % Final  . MCV 04/19/2016 90.0  80.0 - 100.0 fL Final  . MCH 04/19/2016 30.1  26.0 - 34.0 pg Final  . MCHC 04/19/2016 33.5  32.0 - 36.0 g/dL Final  . RDW 04/19/2016 35.6* 11.5 - 14.5 % Final  . Platelets 04/19/2016 224  150 - 440 K/uL Final  . Neutrophils Relative  % 04/19/2016 63  % Final  . Neutro Abs 04/19/2016 3.1  1.4 - 6.5 K/uL Final  . Lymphocytes Relative 04/19/2016 20  % Final  . Lymphs Abs 04/19/2016 1.0  1.0 - 3.6 K/uL Final  . Monocytes Relative 04/19/2016 11  % Final  . Monocytes Absolute 04/19/2016 0.6  0.2 - 1.0 K/uL Final  . Eosinophils Relative 04/19/2016 6  % Final  . Eosinophils Absolute 04/19/2016 0.3  0 - 0.7 K/uL Final  . Basophils Relative 04/19/2016 0  % Final  . Basophils Absolute 04/19/2016 0.0  0 - 0.1 K/uL Final  . Sodium 04/19/2016 134* 135 - 145 mmol/L Final  . Potassium 04/19/2016 3.7  3.5 - 5.1 mmol/L Final  . Chloride 04/19/2016 101  101 - 111 mmol/L Final  . CO2 04/19/2016 28  22 - 32 mmol/L Final  . Glucose, Bld 04/19/2016 113* 65 - 99 mg/dL Final  . BUN 04/19/2016 15  6 - 20 mg/dL Final  . Creatinine, Ser 04/19/2016 0.77  0.61 - 1.24 mg/dL Final  . Calcium 04/19/2016 9.7  8.9 - 10.3 mg/dL Final  . Total Protein 04/19/2016 7.2  6.5 - 8.1 g/dL Final  . Albumin 04/19/2016 3.9  3.5 - 5.0 g/dL Final  . AST 04/19/2016 31  15 - 41 U/L Final  . ALT 04/19/2016 17  17 - 63 U/L Final  . Alkaline Phosphatase 04/19/2016 61  38 - 126 U/L Final  . Total Bilirubin 04/19/2016 0.5  0.3 - 1.2 mg/dL Final  . GFR calc non Af Amer 04/19/2016 >60  >60 mL/min Final  . GFR calc Af Amer 04/19/2016 >60  >60 mL/min Final   Comment: (NOTE) The eGFR has been calculated using the CKD EPI equation. This calculation has not been validated in all clinical situations. eGFR's persistently <60 mL/min signify possible Chronic Kidney Disease.   . Anion gap 04/19/2016 5  5 - 15 Final  . Magnesium 04/19/2016 2.2  1.7 - 2.4 mg/dL Final    Assessment:  Kenneth Luna. is a 82 y.o. male with stage IIC (T4bN0M0) low rectal adenocarcinoma s/p  neoadjuvant chemotherapy and radiation followed by surgery.  He presented with rectal bleeding.  Colonoscopy on 07/10/2015 revealed a 4 cm frond-like/villous ulcerated non-obstructing mass in the rectum.   Biopsy revealed adenocarcinoma.  Chest, abdomen, and pelvic CT scan on 07/27/2015 revealed a circumferential soft tissue mass within the low rectum. He was a T12 compression fracture.  CEA was 4.1 on 07/26/2015.  Flexible sigmoidoscopy and ultrasound on 08/19/2015 revealed a 3.0 x 1.4 cm hypoechoic non-circumferential mass was found in the rectum.  There was no sonographic evidence suggesting breakthrough of the muscularis propria with invasion into the perirectal fat.  No lymph nodes were seen in the perirectal region and in the left iliac region. Stage was early T3uN0.  He completed 5 weeks of concurrent Xeloda and radiation (09/09/2015 - 10/22/2015).  He underwent robotic assisted APR with end colostomy at [facility] on 12/17/2015 at [facility].  Pathology revealed ypT4bN0M0 (stage IIC) invasive moderately differentiated adenocarcinoma.  Levator muscle was involved.  CEA has been followed:  4.1 on 07/26/2015 and 2.6 on 02/01/2016.  He was admitted to [facility] from 07/24/2015 - 07/30/2015 with acute right basal ganglia hemorrhage and left humeral head fracture s/p a fall on 07/21/2015.  He has 24 hour home care.  He is fairly independent with a walker.  He was admitted to [facility] in [place] from 08/08/2015 - 08/09/2015 with acute diverticulitis.  He was discharged home on Augmentin rather than Flagyl given his alcohol dependency.    He has iron deficiency anemia.  Anemia workup on 03/07/2016 revealed a ferritin of 13, iron saturation 5% and TIBC 469 (high).  B12 was 501. Folate was > 47. He takes oral B12 daily.  Diet is good.  He drinks alcohol daily (3 glasses of wine).  He has mild dementia.   He is s/p 3 cycles of adjuvant Xeloda (02/15/2016 - 03/29/2016) for planned 6 cycles.  Cycle #3 was truncated after 1 week secondary to palmar plantar erythrodysesthesia (hand foot syndrome).  Symptomatically, his hands and feet are improving.  He has a grade II hand foot syndrome.  Ferritin is 43.  Plan: 1.  Labs  today:  CBC with diff, CMP, Mg, ferritin. 2.  Discuss iron deficiency.  Discuss taking oral iron BID.  Discuss iron rich foods.  3.  Discuss dose reduction of Xeloda.  He was taking 1500 mg po BID 2 weeks on/1 week off.  Decrease dose to 1000 mg BID.  Begin in 1 week. 4.  No Venofer today secondary to improvement in hematocrit. 5.  Nurse to call patient next week (04/25/2016) to assess hand foot syndrome and +/- begin cycle #4. 6.  RTC in 4 weeks for MD assess and labs (CBC with diff, CMP, Mg).   Lequita Asal, MD  04/19/2016, 9:36 AM

## 2016-04-19 ENCOUNTER — Other Ambulatory Visit: Payer: Self-pay

## 2016-04-19 ENCOUNTER — Inpatient Hospital Stay: Payer: Medicare Other

## 2016-04-19 ENCOUNTER — Encounter: Payer: Self-pay | Admitting: Hematology and Oncology

## 2016-04-19 ENCOUNTER — Inpatient Hospital Stay: Payer: Medicare Other | Attending: Hematology and Oncology

## 2016-04-19 ENCOUNTER — Inpatient Hospital Stay (HOSPITAL_BASED_OUTPATIENT_CLINIC_OR_DEPARTMENT_OTHER): Payer: Medicare Other | Admitting: Hematology and Oncology

## 2016-04-19 VITALS — BP 118/61 | HR 59 | Temp 94.7°F | Resp 18 | Wt 136.2 lb

## 2016-04-19 DIAGNOSIS — K760 Fatty (change of) liver, not elsewhere classified: Secondary | ICD-10-CM | POA: Insufficient documentation

## 2016-04-19 DIAGNOSIS — C20 Malignant neoplasm of rectum: Secondary | ICD-10-CM | POA: Diagnosis not present

## 2016-04-19 DIAGNOSIS — G8929 Other chronic pain: Secondary | ICD-10-CM

## 2016-04-19 DIAGNOSIS — Z87891 Personal history of nicotine dependence: Secondary | ICD-10-CM | POA: Insufficient documentation

## 2016-04-19 DIAGNOSIS — L271 Localized skin eruption due to drugs and medicaments taken internally: Secondary | ICD-10-CM | POA: Insufficient documentation

## 2016-04-19 DIAGNOSIS — R600 Localized edema: Secondary | ICD-10-CM | POA: Diagnosis not present

## 2016-04-19 DIAGNOSIS — Z933 Colostomy status: Secondary | ICD-10-CM | POA: Insufficient documentation

## 2016-04-19 DIAGNOSIS — H409 Unspecified glaucoma: Secondary | ICD-10-CM

## 2016-04-19 DIAGNOSIS — E538 Deficiency of other specified B group vitamins: Secondary | ICD-10-CM | POA: Diagnosis not present

## 2016-04-19 DIAGNOSIS — D509 Iron deficiency anemia, unspecified: Secondary | ICD-10-CM

## 2016-04-19 DIAGNOSIS — I10 Essential (primary) hypertension: Secondary | ICD-10-CM | POA: Insufficient documentation

## 2016-04-19 DIAGNOSIS — Z8781 Personal history of (healed) traumatic fracture: Secondary | ICD-10-CM

## 2016-04-19 DIAGNOSIS — K219 Gastro-esophageal reflux disease without esophagitis: Secondary | ICD-10-CM | POA: Diagnosis not present

## 2016-04-19 DIAGNOSIS — Z86010 Personal history of colonic polyps: Secondary | ICD-10-CM | POA: Diagnosis not present

## 2016-04-19 DIAGNOSIS — F1020 Alcohol dependence, uncomplicated: Secondary | ICD-10-CM

## 2016-04-19 DIAGNOSIS — Z923 Personal history of irradiation: Secondary | ICD-10-CM | POA: Diagnosis not present

## 2016-04-19 DIAGNOSIS — M549 Dorsalgia, unspecified: Secondary | ICD-10-CM

## 2016-04-19 DIAGNOSIS — F0390 Unspecified dementia without behavioral disturbance: Secondary | ICD-10-CM | POA: Diagnosis not present

## 2016-04-19 DIAGNOSIS — Z9221 Personal history of antineoplastic chemotherapy: Secondary | ICD-10-CM | POA: Insufficient documentation

## 2016-04-19 DIAGNOSIS — Z79899 Other long term (current) drug therapy: Secondary | ICD-10-CM | POA: Insufficient documentation

## 2016-04-19 DIAGNOSIS — E785 Hyperlipidemia, unspecified: Secondary | ICD-10-CM

## 2016-04-19 DIAGNOSIS — M5489 Other dorsalgia: Secondary | ICD-10-CM | POA: Diagnosis not present

## 2016-04-19 LAB — CBC WITH DIFFERENTIAL/PLATELET
Basophils Absolute: 0 10*3/uL (ref 0–0.1)
Basophils Relative: 0 %
Eosinophils Absolute: 0.3 10*3/uL (ref 0–0.7)
Eosinophils Relative: 6 %
HCT: 36.1 % — ABNORMAL LOW (ref 40.0–52.0)
Hemoglobin: 12.1 g/dL — ABNORMAL LOW (ref 13.0–18.0)
Lymphocytes Relative: 20 %
Lymphs Abs: 1 10*3/uL (ref 1.0–3.6)
MCH: 30.1 pg (ref 26.0–34.0)
MCHC: 33.5 g/dL (ref 32.0–36.0)
MCV: 90 fL (ref 80.0–100.0)
Monocytes Absolute: 0.6 10*3/uL (ref 0.2–1.0)
Monocytes Relative: 11 %
Neutro Abs: 3.1 10*3/uL (ref 1.4–6.5)
Neutrophils Relative %: 63 %
Platelets: 224 10*3/uL (ref 150–440)
RBC: 4.01 MIL/uL — ABNORMAL LOW (ref 4.40–5.90)
RDW: 35.6 % — ABNORMAL HIGH (ref 11.5–14.5)
WBC: 5 10*3/uL (ref 3.8–10.6)

## 2016-04-19 LAB — COMPREHENSIVE METABOLIC PANEL
ALT: 17 U/L (ref 17–63)
AST: 31 U/L (ref 15–41)
Albumin: 3.9 g/dL (ref 3.5–5.0)
Alkaline Phosphatase: 61 U/L (ref 38–126)
Anion gap: 5 (ref 5–15)
BUN: 15 mg/dL (ref 6–20)
CO2: 28 mmol/L (ref 22–32)
Calcium: 9.7 mg/dL (ref 8.9–10.3)
Chloride: 101 mmol/L (ref 101–111)
Creatinine, Ser: 0.77 mg/dL (ref 0.61–1.24)
GFR calc Af Amer: >60 mL/min (ref >60)
GFR calc non Af Amer: >60 mL/min (ref >60)
Glucose, Bld: 113 mg/dL — ABNORMAL HIGH (ref 65–99)
Potassium: 3.7 mmol/L (ref 3.5–5.1)
Sodium: 134 mmol/L — ABNORMAL LOW (ref 135–145)
Total Bilirubin: 0.5 mg/dL (ref 0.3–1.2)
Total Protein: 7.2 g/dL (ref 6.5–8.1)

## 2016-04-19 LAB — FERRITIN: Ferritin: 43 ng/mL (ref 24–336)

## 2016-04-19 LAB — MAGNESIUM: Magnesium: 2.2 mg/dL (ref 1.7–2.4)

## 2016-04-19 NOTE — Progress Notes (Signed)
Patient currently not taking Xeloda.  Hands and feet are painful and peeling. States today is a good day because before now he has had a hard time walking.  Also states he lost a nail off his right small toe.

## 2016-04-25 ENCOUNTER — Telehealth: Payer: Self-pay | Admitting: *Deleted

## 2016-04-25 NOTE — Telephone Encounter (Signed)
Called Mr. Gerrard's home and caregiver reported that the patient had reported that his hand and foot syndrome had improved and that he had restarted his xeloda today.  Informed her to call the clinic with any other problems, voiced understanding.

## 2016-05-12 ENCOUNTER — Encounter: Payer: Self-pay | Admitting: *Deleted

## 2016-05-13 ENCOUNTER — Other Ambulatory Visit: Payer: Self-pay | Admitting: *Deleted

## 2016-05-16 NOTE — Progress Notes (Signed)
[facility] day: 05/17/2016   Chief Complaint: Janoah Menna. is a 83 y.o. male with rectal cancer s/p neoadjuvant Xeloda and radiation who is seen for assessment prior to cycle #5 Xeloda.  HPI:  The patient was last seen in the medical oncology clinic on 04/19/2016.  At that time, he was s/p a truncated cycle #3 Xeloda.  He had an improving grade II hand foot syndrome.  He was to hold his Xeloda for an extra week then begin dose reduced Xeloda (1000 mg BID).  He began cycle #4 Xeloda on 04/25/2016.    Symptomatically, he is doing "great".  His hands and feet are clearing up.  He denies nay diarrhea.  He started cycle #5 on 05/16/2016.   Past Medical History:  Diagnosis Date  . Allergic rhinitis   . Anemia   . B12 deficiency 06/29/2015  . Benign neoplasm of ascending colon   . Benign neoplasm of descending colon   . Colon polyp   . DD (diverticular disease) 06/29/2015  . Dementia    memory issues  . Fatty liver   . GERD (gastroesophageal reflux disease)   . Glaucoma   . Hyperlipidemia   . Hypertension   . Rectal adenocarcinoma ([place]) 07/15/2015  . Substance abuse    alcohol  . Traumatic intraparenchymal hemorrhage [facility])     Past Surgical History:  Procedure Laterality Date  . CATARACT EXTRACTION W/ INTRAOCULAR LENS IMPLANT    . COLONOSCOPY WITH PROPOFOL N/A 07/10/2015   Procedure: COLONOSCOPY WITH PROPOFOL;  Surgeon: Lucilla Lame, MD;  Location: [place];  Service: Endoscopy;  Laterality: N/A;  PER CAREGIVER WAS TOLD THAT PT WOULD BE 1ST (KEEP PT EARLY AM)  . POLYPECTOMY  07/10/2015   Procedure: POLYPECTOMY;  Surgeon: Lucilla Lame, MD;  Location: [place];  Service: Endoscopy;;    Family History  Problem Relation Age of Onset  . Cancer Father     Prostate    Social History:  reports that he quit smoking about 38 years ago. His smoking use included Pipe. He has never used smokeless tobacco. He reports  that he drinks about 16.8 oz of alcohol per week . He reports that he does not use drugs.  He drinks 3 glasses of wine a day.  The patient is widowed.  He has no children.  The patient is accompanied by his caregiver, [name], today.  Allergies: No Known Allergies  Current Medications: Current Outpatient Prescriptions  Medication Sig Dispense Refill  . brimonidine-timolol (COMBIGAN) 0.2-0.5 % ophthalmic solution Place 1 drop into both eyes 2 (two) times daily.    . capecitabine (XELODA) 500 MG tablet Take 3 tablets (1,500 mg total) by mouth 2 (two) times daily after a meal. Take on days 1-14. Repeat every 21 days. 84 tablet 1  . cetirizine (ZYRTEC) 10 MG tablet Take 10 mg by mouth daily.     . cholecalciferol (VITAMIN D) 1000 units tablet Take 1,000 Units by mouth daily.    . clobetasol cream (TEMOVATE) 0.05 % Apply topically.    . docusate sodium (COLACE) 100 MG capsule Take 100 mg by mouth daily.    Marland Kitchen donepezil (ARICEPT) 10 MG tablet Take 10 mg by mouth at bedtime.    . fluticasone (FLONASE) 50 MCG/ACT nasal spray Place 2 sprays into both nostrils daily as needed for rhinitis.     . folic acid (FOLVITE) 1 MG tablet Take 1 tablet (1 mg total) by  mouth daily. 30 tablet 2  . furosemide (LASIX) 20 MG tablet Take by mouth.    . gabapentin (NEURONTIN) 100 MG capsule     . hydrocortisone (ANUSOL-HC) 25 MG suppository Place 1 suppository (25 mg total) rectally 2 (two) times daily. 12 suppository 0  . Iron-Vitamin C (IRON 100/C PO) Take by mouth.    . lidocaine (XYLOCAINE) 5 % ointment   0  . meloxicam (MOBIC) 15 MG tablet Take 15 mg by mouth.    . metoprolol succinate (TOPROL XL) 25 MG 24 hr tablet Take 1 tablet (25 mg total) by mouth daily. 30 tablet 0  . montelukast (SINGULAIR) 10 MG tablet Take 10 mg by mouth at bedtime. Reported on 08/21/2015    . mupirocin ointment (BACTROBAN) 2 % Apply 1 application topically daily as needed.   0  . omeprazole (PRILOSEC) 20 MG capsule Take 1 capsule (20  mg total) by mouth daily. 30 capsule 0  . ondansetron (ZOFRAN) 8 MG tablet Take 1 tablet (8 mg total) by mouth every 8 (eight) hours as needed for nausea or vomiting. 20 tablet 1  . ondansetron (ZOFRAN) 8 MG tablet Take 1 tablet (8 mg total) by mouth 2 (two) times daily as needed (Nausea or vomiting). 30 tablet 1  . thiamine 100 MG tablet Take 1 tablet (100 mg total) by mouth daily. 30 tablet 2  . valsartan (DIOVAN) 80 MG tablet Take 80 mg by mouth daily.    . vitamin B-12 (CYANOCOBALAMIN) 1000 MCG tablet Take 1,000 mcg by mouth daily.     No current facility-administered medications for this visit.     Review of Systems:  GENERAL:  Feels great.  No fevers or sweats .  Weight stable. PERFORMANCE STATUS (ECOG):  2 HEENT: No visual changes, sore throat, mouth sores or tenderness. Lungs: No shortness of breath or cough.  No hemoptysis. Cardiac:  No chest pain, palpitations, orthopnea, or PND. GI:  Ostomy.  No nausea, vomiting, diarrhea, or constipation. GU:  No urgency, frequency, dysuria, or hematuria. Musculoskeletal:  Chronic back pain secondary to T12 compression fracture.  No pain or muscle tenderness. Extremities:  Little discomfort (pain/itching) in his feet.  Mild lower extremity swelling (takes Lasix prn). Skin:  Feet and hands are clearing up.  No rashes, ulcers or skin changes.  Neuro:  Mild dementia.  Balance and coordination off (chronic).  No headache, numbness or weakness. Endocrine:  No diabetes, thyroid issues, hot flashes or night sweats. Psych:  No mood changes, depression or anxiety. Pain: No pain. Review of systems:  All other systems reviewed and found to be negative.  Physical Exam: Blood pressure 108/65, pulse (!) 54, temperature (!) 94.8 F (34.9 C), temperature source Tympanic, resp. rate 18, weight 136 lb 14.5 oz (62.1 kg). GENERAL:  Well developed, well nourished, elderly gentleman sitting comfortably in the exam room in no acute distress.  He has a 4 point cane  at his side MENTAL STATUS:  Alert and oriented to person, place and time. HEAD: Pearline Cables hair.  Normocephalic, atraumatic, face symmetric, no Cushingoid features. EYES:  Glasses.  Blue eyes.  Pupils equal round and reactive to light and accomodation.  No conjunctivitis or scleral icterus. ENT:  Oropharynx clear without lesion.  Tongue normal.  Mucous membranes moist.  RESPIRATORY:  Clear to auscultation without rales, wheezes or rhonchi. CARDIOVASCULAR:  Regular rate and rhythm without murmur, rub or gallop. ABDOMEN:  Ostomy.  Soft, non-tender, with active bowel sounds, and no hepatosplenomegaly.  No masses.  SKIN:  Hands with new skin (palmar surface).  Feet less dry without cracking. EXTREMITIES:  Improving grade II hand-foot syndrome.  No edema, no skin discoloration or tenderness.  No palpable cords. LYMPH NODES: No palpable cervical, supraclavicular, axillary or inguinal adenopathy  NEUROLOGICAL: Unremarkable. PSYCH:  Appropriate.   Appointment on 05/17/2016  Component Date Value Ref Range Status  . WBC 05/17/2016 4.5  3.8 - 10.6 K/uL Final  . RBC 05/17/2016 3.92* 4.40 - 5.90 MIL/uL Final  . Hemoglobin 05/17/2016 12.9* 13.0 - 18.0 g/dL Final  . HCT 05/17/2016 37.7* 40.0 - 52.0 % Final  . MCV 05/17/2016 96.2  80.0 - 100.0 fL Final  . MCH 05/17/2016 32.9  26.0 - 34.0 pg Final  . MCHC 05/17/2016 34.2  32.0 - 36.0 g/dL Final  . RDW 05/17/2016 28.4* 11.5 - 14.5 % Final  . Platelets 05/17/2016 191  150 - 440 K/uL Final  . Neutrophils Relative % 05/17/2016 59  % Final  . Neutro Abs 05/17/2016 2.7  1.4 - 6.5 K/uL Final  . Lymphocytes Relative 05/17/2016 22  % Final  . Lymphs Abs 05/17/2016 1.0  1.0 - 3.6 K/uL Final  . Monocytes Relative 05/17/2016 12  % Final  . Monocytes Absolute 05/17/2016 0.5  0.2 - 1.0 K/uL Final  . Eosinophils Relative 05/17/2016 5  % Final  . Eosinophils Absolute 05/17/2016 0.2  0 - 0.7 K/uL Final  . Basophils Relative 05/17/2016 2  % Final  . Basophils Absolute  05/17/2016 0.1  0 - 0.1 K/uL Final  . Sodium 05/17/2016 136  135 - 145 mmol/L Final  . Potassium 05/17/2016 3.9  3.5 - 5.1 mmol/L Final  . Chloride 05/17/2016 103  101 - 111 mmol/L Final  . CO2 05/17/2016 26  22 - 32 mmol/L Final  . Glucose, Bld 05/17/2016 93  65 - 99 mg/dL Final  . BUN 05/17/2016 13  6 - 20 mg/dL Final  . Creatinine, Ser 05/17/2016 0.68  0.61 - 1.24 mg/dL Final  . Calcium 05/17/2016 9.8  8.9 - 10.3 mg/dL Final  . Total Protein 05/17/2016 6.7  6.5 - 8.1 g/dL Final  . Albumin 05/17/2016 3.7  3.5 - 5.0 g/dL Final  . AST 05/17/2016 29  15 - 41 U/L Final  . ALT 05/17/2016 19  17 - 63 U/L Final  . Alkaline Phosphatase 05/17/2016 63  38 - 126 U/L Final  . Total Bilirubin 05/17/2016 0.5  0.3 - 1.2 mg/dL Final  . GFR calc non Af Amer 05/17/2016 >60  >60 mL/min Final  . GFR calc Af Amer 05/17/2016 >60  >60 mL/min Final   Comment: (NOTE) The eGFR has been calculated using the CKD EPI equation. This calculation has not been validated in all clinical situations. eGFR's persistently <60 mL/min signify possible Chronic Kidney Disease.   . Anion gap 05/17/2016 7  5 - 15 Final  . Magnesium 05/17/2016 2.1  1.7 - 2.4 mg/dL Final    Assessment:  Hamlin Devine. is a 83 y.o. male with stage IIC (T4bN0M0) low rectal adenocarcinoma s/p neoadjuvant chemotherapy and radiation followed by surgery.  He presented with rectal bleeding.  Colonoscopy on 07/10/2015 revealed a 4 cm frond-like/villous ulcerated non-obstructing mass in the rectum.  Biopsy revealed adenocarcinoma.  Chest, abdomen, and pelvic CT scan on 07/27/2015 revealed a circumferential soft tissue mass within the low rectum. He was a T12 compression fracture.  CEA was 4.1 on 07/26/2015.  Flexible sigmoidoscopy and ultrasound on 08/19/2015 revealed a 3.0 x 1.4  cm hypoechoic non-circumferential mass was found in the rectum.  There was no sonographic evidence suggesting breakthrough of the muscularis propria with invasion into  the perirectal fat.  No lymph nodes were seen in the perirectal region and in the left iliac region. Stage was early T3uN0.  He completed 5 weeks of concurrent Xeloda and radiation (09/09/2015 - 10/22/2015).  He underwent robotic assisted APR with end colostomy at [facility] on 12/17/2015 at [facility].  Pathology revealed ypT4bN0M0 (stage IIC) invasive moderately differentiated adenocarcinoma.  Levator muscle was involved.  CEA has been followed:  4.1 on 07/26/2015 and 2.6 on 02/01/2016.  He was admitted to [facility] from 07/24/2015 - 07/30/2015 with acute right basal ganglia hemorrhage and left humeral head fracture s/p a fall on 07/21/2015.  He has 24 hour home care.  He is fairly independent with a walker.  He was admitted to [facility] in [place] from 08/08/2015 - 08/09/2015 with acute diverticulitis.  He was discharged home on Augmentin rather than Flagyl given his alcohol dependency.    He has iron deficiency anemia.  Anemia workup on 03/07/2016 revealed a ferritin of 13, iron saturation 5% and TIBC 469 (high).  B12 was 501. Folate was > 47.  He takes oral B12 daily.  Diet is good.  Ferritin was 43 on 04/19/2016.  He drinks alcohol daily (3 glasses of wine).  He has mild dementia.   He is s/p 4 cycles of adjuvant Xeloda (02/15/2016 -  04/25/2016) for planned 6 cycles.  Cycle #3 was truncated after 1 week secondary to palmar plantar erythrodysesthesia (hand foot syndrome).  Xeloda was dose reduced with cycle #4.  Symptomatically, his hands and feet are back to baseline.  He feels good.  Plan: 1.  Labs today:  CBC with diff, CMP, Mg. 2.  Continue cycle #5 Xeloda 2 weeks on/1 week off. 3.  Continue oral iron and iron rich foods.  4.  RTC in 3 weeks for MD assessment, labs (CBC with diff, CMP, Mg), and initiation of cycle #6 Xeloda.   Lequita Asal, MD  05/17/2016, 10:03 AM

## 2016-05-17 ENCOUNTER — Inpatient Hospital Stay: Payer: Medicare Other

## 2016-05-17 ENCOUNTER — Other Ambulatory Visit: Payer: Self-pay | Admitting: Family Medicine

## 2016-05-17 ENCOUNTER — Inpatient Hospital Stay (HOSPITAL_BASED_OUTPATIENT_CLINIC_OR_DEPARTMENT_OTHER): Payer: Medicare Other | Admitting: Hematology and Oncology

## 2016-05-17 ENCOUNTER — Encounter: Payer: Self-pay | Admitting: Hematology and Oncology

## 2016-05-17 VITALS — BP 108/65 | HR 54 | Temp 94.8°F | Resp 18 | Wt 136.9 lb

## 2016-05-17 DIAGNOSIS — L271 Localized skin eruption due to drugs and medicaments taken internally: Secondary | ICD-10-CM | POA: Diagnosis not present

## 2016-05-17 DIAGNOSIS — Z923 Personal history of irradiation: Secondary | ICD-10-CM | POA: Diagnosis not present

## 2016-05-17 DIAGNOSIS — Z87891 Personal history of nicotine dependence: Secondary | ICD-10-CM

## 2016-05-17 DIAGNOSIS — Z933 Colostomy status: Secondary | ICD-10-CM | POA: Diagnosis not present

## 2016-05-17 DIAGNOSIS — M5489 Other dorsalgia: Secondary | ICD-10-CM

## 2016-05-17 DIAGNOSIS — C20 Malignant neoplasm of rectum: Secondary | ICD-10-CM

## 2016-05-17 DIAGNOSIS — Z86010 Personal history of colonic polyps: Secondary | ICD-10-CM

## 2016-05-17 DIAGNOSIS — Z8781 Personal history of (healed) traumatic fracture: Secondary | ICD-10-CM

## 2016-05-17 DIAGNOSIS — Z9221 Personal history of antineoplastic chemotherapy: Secondary | ICD-10-CM | POA: Diagnosis not present

## 2016-05-17 DIAGNOSIS — E785 Hyperlipidemia, unspecified: Secondary | ICD-10-CM

## 2016-05-17 DIAGNOSIS — F0390 Unspecified dementia without behavioral disturbance: Secondary | ICD-10-CM | POA: Diagnosis not present

## 2016-05-17 DIAGNOSIS — D509 Iron deficiency anemia, unspecified: Secondary | ICD-10-CM

## 2016-05-17 DIAGNOSIS — G8929 Other chronic pain: Secondary | ICD-10-CM

## 2016-05-17 DIAGNOSIS — Z79899 Other long term (current) drug therapy: Secondary | ICD-10-CM

## 2016-05-17 DIAGNOSIS — H409 Unspecified glaucoma: Secondary | ICD-10-CM

## 2016-05-17 DIAGNOSIS — K760 Fatty (change of) liver, not elsewhere classified: Secondary | ICD-10-CM

## 2016-05-17 DIAGNOSIS — K219 Gastro-esophageal reflux disease without esophagitis: Secondary | ICD-10-CM

## 2016-05-17 DIAGNOSIS — E538 Deficiency of other specified B group vitamins: Secondary | ICD-10-CM

## 2016-05-17 DIAGNOSIS — I10 Essential (primary) hypertension: Secondary | ICD-10-CM | POA: Diagnosis not present

## 2016-05-17 DIAGNOSIS — F1020 Alcohol dependence, uncomplicated: Secondary | ICD-10-CM

## 2016-05-17 LAB — CBC WITH DIFFERENTIAL/PLATELET
Basophils Absolute: 0.1 10*3/uL (ref 0–0.1)
Basophils Relative: 2 %
Eosinophils Absolute: 0.2 10*3/uL (ref 0–0.7)
Eosinophils Relative: 5 %
HCT: 37.7 % — ABNORMAL LOW (ref 40.0–52.0)
Hemoglobin: 12.9 g/dL — ABNORMAL LOW (ref 13.0–18.0)
Lymphocytes Relative: 22 %
Lymphs Abs: 1 10*3/uL (ref 1.0–3.6)
MCH: 32.9 pg (ref 26.0–34.0)
MCHC: 34.2 g/dL (ref 32.0–36.0)
MCV: 96.2 fL (ref 80.0–100.0)
Monocytes Absolute: 0.5 10*3/uL (ref 0.2–1.0)
Monocytes Relative: 12 %
Neutro Abs: 2.7 10*3/uL (ref 1.4–6.5)
Neutrophils Relative %: 59 %
Platelets: 191 10*3/uL (ref 150–440)
RBC: 3.92 MIL/uL — ABNORMAL LOW (ref 4.40–5.90)
RDW: 28.4 % — ABNORMAL HIGH (ref 11.5–14.5)
WBC: 4.5 10*3/uL (ref 3.8–10.6)

## 2016-05-17 LAB — COMPREHENSIVE METABOLIC PANEL
ALT: 19 U/L (ref 17–63)
AST: 29 U/L (ref 15–41)
Albumin: 3.7 g/dL (ref 3.5–5.0)
Alkaline Phosphatase: 63 U/L (ref 38–126)
Anion gap: 7 (ref 5–15)
BUN: 13 mg/dL (ref 6–20)
CO2: 26 mmol/L (ref 22–32)
Calcium: 9.8 mg/dL (ref 8.9–10.3)
Chloride: 103 mmol/L (ref 101–111)
Creatinine, Ser: 0.68 mg/dL (ref 0.61–1.24)
GFR calc Af Amer: >60 mL/min (ref >60)
GFR calc non Af Amer: >60 mL/min (ref >60)
Glucose, Bld: 93 mg/dL (ref 65–99)
Potassium: 3.9 mmol/L (ref 3.5–5.1)
Sodium: 136 mmol/L (ref 135–145)
Total Bilirubin: 0.5 mg/dL (ref 0.3–1.2)
Total Protein: 6.7 g/dL (ref 6.5–8.1)

## 2016-05-17 LAB — MAGNESIUM: Magnesium: 2.1 mg/dL (ref 1.7–2.4)

## 2016-05-17 NOTE — Progress Notes (Signed)
Patient offers no complaints today. 

## 2016-06-03 ENCOUNTER — Other Ambulatory Visit: Payer: Self-pay | Admitting: Family Medicine

## 2016-06-07 ENCOUNTER — Inpatient Hospital Stay (HOSPITAL_BASED_OUTPATIENT_CLINIC_OR_DEPARTMENT_OTHER): Payer: Medicare Other | Admitting: Hematology and Oncology

## 2016-06-07 ENCOUNTER — Inpatient Hospital Stay: Payer: Medicare Other | Attending: Hematology and Oncology

## 2016-06-07 VITALS — BP 120/73 | HR 52 | Temp 96.0°F | Resp 18 | Wt 138.0 lb

## 2016-06-07 DIAGNOSIS — G8929 Other chronic pain: Secondary | ICD-10-CM | POA: Insufficient documentation

## 2016-06-07 DIAGNOSIS — F0390 Unspecified dementia without behavioral disturbance: Secondary | ICD-10-CM | POA: Diagnosis not present

## 2016-06-07 DIAGNOSIS — Z87311 Personal history of (healed) other pathological fracture: Secondary | ICD-10-CM | POA: Insufficient documentation

## 2016-06-07 DIAGNOSIS — I10 Essential (primary) hypertension: Secondary | ICD-10-CM | POA: Insufficient documentation

## 2016-06-07 DIAGNOSIS — Z933 Colostomy status: Secondary | ICD-10-CM

## 2016-06-07 DIAGNOSIS — Z8042 Family history of malignant neoplasm of prostate: Secondary | ICD-10-CM

## 2016-06-07 DIAGNOSIS — G629 Polyneuropathy, unspecified: Secondary | ICD-10-CM | POA: Insufficient documentation

## 2016-06-07 DIAGNOSIS — M549 Dorsalgia, unspecified: Secondary | ICD-10-CM

## 2016-06-07 DIAGNOSIS — Z87891 Personal history of nicotine dependence: Secondary | ICD-10-CM | POA: Diagnosis not present

## 2016-06-07 DIAGNOSIS — H409 Unspecified glaucoma: Secondary | ICD-10-CM

## 2016-06-07 DIAGNOSIS — Z79899 Other long term (current) drug therapy: Secondary | ICD-10-CM

## 2016-06-07 DIAGNOSIS — F1020 Alcohol dependence, uncomplicated: Secondary | ICD-10-CM | POA: Diagnosis not present

## 2016-06-07 DIAGNOSIS — Z923 Personal history of irradiation: Secondary | ICD-10-CM | POA: Diagnosis not present

## 2016-06-07 DIAGNOSIS — R600 Localized edema: Secondary | ICD-10-CM | POA: Insufficient documentation

## 2016-06-07 DIAGNOSIS — Z9221 Personal history of antineoplastic chemotherapy: Secondary | ICD-10-CM | POA: Insufficient documentation

## 2016-06-07 DIAGNOSIS — E538 Deficiency of other specified B group vitamins: Secondary | ICD-10-CM | POA: Insufficient documentation

## 2016-06-07 DIAGNOSIS — D509 Iron deficiency anemia, unspecified: Secondary | ICD-10-CM

## 2016-06-07 DIAGNOSIS — Z86010 Personal history of colonic polyps: Secondary | ICD-10-CM

## 2016-06-07 DIAGNOSIS — E785 Hyperlipidemia, unspecified: Secondary | ICD-10-CM

## 2016-06-07 DIAGNOSIS — C20 Malignant neoplasm of rectum: Secondary | ICD-10-CM | POA: Insufficient documentation

## 2016-06-07 DIAGNOSIS — K760 Fatty (change of) liver, not elsewhere classified: Secondary | ICD-10-CM | POA: Insufficient documentation

## 2016-06-07 DIAGNOSIS — K219 Gastro-esophageal reflux disease without esophagitis: Secondary | ICD-10-CM | POA: Diagnosis not present

## 2016-06-07 LAB — CBC WITH DIFFERENTIAL/PLATELET
Basophils Absolute: 0 10*3/uL (ref 0–0.1)
Basophils Relative: 0 %
Eosinophils Absolute: 0.2 10*3/uL (ref 0–0.7)
Eosinophils Relative: 4 %
HCT: 39.1 % — ABNORMAL LOW (ref 40.0–52.0)
Hemoglobin: 13.8 g/dL (ref 13.0–18.0)
Lymphocytes Relative: 21 %
Lymphs Abs: 0.9 10*3/uL — ABNORMAL LOW (ref 1.0–3.6)
MCH: 35 pg — ABNORMAL HIGH (ref 26.0–34.0)
MCHC: 35.2 g/dL (ref 32.0–36.0)
MCV: 99.3 fL (ref 80.0–100.0)
Monocytes Absolute: 0.5 10*3/uL (ref 0.2–1.0)
Monocytes Relative: 12 %
Neutro Abs: 2.7 10*3/uL (ref 1.4–6.5)
Neutrophils Relative %: 63 %
Platelets: 218 10*3/uL (ref 150–440)
RBC: 3.94 MIL/uL — ABNORMAL LOW (ref 4.40–5.90)
RDW: 20.9 % — ABNORMAL HIGH (ref 11.5–14.5)
WBC: 4.3 10*3/uL (ref 3.8–10.6)

## 2016-06-07 LAB — COMPREHENSIVE METABOLIC PANEL
ALT: 21 U/L (ref 17–63)
AST: 32 U/L (ref 15–41)
Albumin: 3.8 g/dL (ref 3.5–5.0)
Alkaline Phosphatase: 65 U/L (ref 38–126)
Anion gap: 5 (ref 5–15)
BUN: 14 mg/dL (ref 6–20)
CO2: 29 mmol/L (ref 22–32)
Calcium: 9.8 mg/dL (ref 8.9–10.3)
Chloride: 103 mmol/L (ref 101–111)
Creatinine, Ser: 0.51 mg/dL — ABNORMAL LOW (ref 0.61–1.24)
GFR calc Af Amer: >60 mL/min (ref >60)
GFR calc non Af Amer: >60 mL/min (ref >60)
Glucose, Bld: 105 mg/dL — ABNORMAL HIGH (ref 65–99)
Potassium: 4.3 mmol/L (ref 3.5–5.1)
Sodium: 137 mmol/L (ref 135–145)
Total Bilirubin: 0.5 mg/dL (ref 0.3–1.2)
Total Protein: 7.1 g/dL (ref 6.5–8.1)

## 2016-06-07 LAB — MAGNESIUM: Magnesium: 2.1 mg/dL (ref 1.7–2.4)

## 2016-06-07 NOTE — Progress Notes (Signed)
[place] [facility] day: 06/07/2016   Chief Complaint: Kenneth Luna. is a 83 y.o. male with rectal cancer s/p neoadjuvant Xeloda and radiation who is seen for assessment prior to cycle #6 Xeloda.  HPI:  The patient was last seen in the medical oncology clinic on 05/17/2016.  At that time, he had completed cycle #4 Xeloda.  He tolerated reduced dose Xeloda (1000 mg BID) without recurrence of hand foot syndrome.  He began cycle #5 Xeloda on 05/16/2016.    Symptomatically, he has been feeling "good". He denies nausea, vomiting, diarrhea or constipation. He has occasional neuropathy in feet but it is not bothersome.  He is very excited to be on his last cycle. He began Cycle # 6 of Xeloda on Monday, 06/06/2016.    His birthday is Friday, 06/10/2014.  He is going to lunch on Friday and having a party on Saturday.   Past Medical History:  Diagnosis Date  . Allergic rhinitis   . Anemia   . B12 deficiency 06/29/2015  . Benign neoplasm of ascending colon   . Benign neoplasm of descending colon   . Colon polyp   . DD (diverticular disease) 06/29/2015  . Dementia    memory issues  . Fatty liver   . GERD (gastroesophageal reflux disease)   . Glaucoma   . Hyperlipidemia   . Hypertension   . Rectal adenocarcinoma ([place]) 07/15/2015  . Substance abuse    alcohol  . Traumatic intraparenchymal hemorrhage [facility])     Past Surgical History:  Procedure Laterality Date  . CATARACT EXTRACTION W/ INTRAOCULAR LENS IMPLANT    . COLONOSCOPY WITH PROPOFOL N/A 07/10/2015   Procedure: COLONOSCOPY WITH PROPOFOL;  Surgeon: Lucilla Lame, MD;  Location: [place];  Service: Endoscopy;  Laterality: N/A;  PER CAREGIVER WAS TOLD THAT PT WOULD BE 1ST (KEEP PT EARLY AM)  . POLYPECTOMY  07/10/2015   Procedure: POLYPECTOMY;  Surgeon: Lucilla Lame, MD;  Location: [place];  Service: Endoscopy;;    Family History  Problem Relation Age of Onset  . Cancer  Father     Prostate    Social History:  reports that he quit smoking about 38 years ago. His smoking use included Pipe. He has never used smokeless tobacco. He reports that he drinks about 16.8 oz of alcohol per week . He reports that he does not use drugs.  He drinks 3 glasses of wine a day.  The patient is widowed.  He has no children.  The patient is accompanied by his caregiver, [name], today.  Allergies: No Known Allergies  Current Medications: Current Outpatient Prescriptions  Medication Sig Dispense Refill  . brimonidine-timolol (COMBIGAN) 0.2-0.5 % ophthalmic solution Place 1 drop into both eyes 2 (two) times daily.    . capecitabine (XELODA) 500 MG tablet Take 3 tablets (1,500 mg total) by mouth 2 (two) times daily after a meal. Take on days 1-14. Repeat every 21 days. 84 tablet 1  . cetirizine (ZYRTEC) 10 MG tablet Take 10 mg by mouth daily.     . cholecalciferol (VITAMIN D) 1000 units tablet Take 1,000 Units by mouth daily.    . clobetasol cream (TEMOVATE) 0.05 % Apply topically.    . docusate sodium (COLACE) 100 MG capsule Take 100 mg by mouth daily.    Marland Kitchen donepezil (ARICEPT) 10 MG tablet Take 10 mg by mouth at bedtime.    . fluticasone (FLONASE) 50 MCG/ACT nasal spray Place 2 sprays  into both nostrils daily as needed for rhinitis.     . folic acid (FOLVITE) 1 MG tablet Take 1 tablet (1 mg total) by mouth daily. 30 tablet 2  . furosemide (LASIX) 20 MG tablet Take by mouth.    . gabapentin (NEURONTIN) 100 MG capsule     . hydrocortisone (ANUSOL-HC) 25 MG suppository Place 1 suppository (25 mg total) rectally 2 (two) times daily. 12 suppository 0  . Iron-Vitamin C (IRON 100/C PO) Take by mouth.    . lidocaine (XYLOCAINE) 5 % ointment   0  . meloxicam (MOBIC) 15 MG tablet Take 15 mg by mouth.    . metoprolol succinate (TOPROL XL) 25 MG 24 hr tablet Take 1 tablet (25 mg total) by mouth daily. 30 tablet 0  . montelukast (SINGULAIR) 10 MG tablet Take 10 mg by mouth at bedtime.  Reported on 08/21/2015    . mupirocin ointment (BACTROBAN) 2 % Apply 1 application topically daily as needed.   0  . omeprazole (PRILOSEC) 20 MG capsule Take 1 capsule (20 mg total) by mouth daily. 30 capsule 0  . ondansetron (ZOFRAN) 8 MG tablet Take 1 tablet (8 mg total) by mouth every 8 (eight) hours as needed for nausea or vomiting. 20 tablet 1  . ondansetron (ZOFRAN) 8 MG tablet Take 1 tablet (8 mg total) by mouth 2 (two) times daily as needed (Nausea or vomiting). 30 tablet 1  . thiamine 100 MG tablet Take 1 tablet (100 mg total) by mouth daily. 30 tablet 2  . valsartan (DIOVAN) 80 MG tablet Take 80 mg by mouth daily.    . vitamin B-12 (CYANOCOBALAMIN) 1000 MCG tablet Take 1,000 mcg by mouth daily.     No current facility-administered medications for this visit.     Review of Systems:  GENERAL:  Feels good.  No fevers or sweats .  Weight up 2 pounds. PERFORMANCE STATUS (ECOG):  2 HEENT: No visual changes, sore throat, mouth sores or tenderness. Lungs: No shortness of breath or cough.  No hemoptysis. Cardiac:  No chest pain, palpitations, orthopnea, or PND. GI:  Ostomy.  No nausea, vomiting, diarrhea, or constipation. GU:  No urgency, frequency, dysuria, or hematuria. Musculoskeletal:  Chronic back pain secondary to T12 compression fracture.  No pain or muscle tenderness. Extremities:  Mild lower extremity swelling (takes Lasix prn).  No pain. Skin:  Feet and hands are normal.  No rashes, ulcers or skin changes.  Neuro:  Mild dementia.  Balance and coordination off (chronic).  No headache, numbness or weakness. Endocrine:  No diabetes, thyroid issues, hot flashes or night sweats. Psych:  No mood changes, depression or anxiety. Pain: No pain. Review of systems:  All other systems reviewed and found to be negative.  Physical Exam: Blood pressure 120/73, pulse (!) 52, temperature (!) 96 F (35.6 C), temperature source Tympanic, resp. rate 18, weight 138 lb 0.1 oz (62.6 kg). GENERAL:   Well developed, well nourished, elderly gentleman sitting comfortably in the exam room in no acute distress.  He has a 4 point cane at his side MENTAL STATUS:  Alert and oriented to person, place and time. HEAD: Pearline Cables hair.  Normocephalic, atraumatic, face symmetric, no Cushingoid features. EYES:  Glasses.  Blue eyes.  Pupils equal round and reactive to light and accomodation.  No conjunctivitis or scleral icterus. ENT:  Oropharynx clear without lesion.  Tongue normal.  Mucous membranes moist.  RESPIRATORY:  Clear to auscultation without rales, wheezes or rhonchi. CARDIOVASCULAR:  Regular rate  and rhythm without murmur, rub or gallop. ABDOMEN:  Ostomy.  Soft, non-tender, with active bowel sounds, and no hepatosplenomegaly.  No masses. SKIN:  Healed hands and feet.  No desquamation. EXTREMITIES: No edema, no skin discoloration or tenderness.  No palpable cords. LYMPH NODES: No palpable cervical, supraclavicular, axillary or inguinal adenopathy  NEUROLOGICAL: Unremarkable. PSYCH:  Appropriate.   Appointment on 06/07/2016  Component Date Value Ref Range Status  . WBC 06/07/2016 4.3  3.8 - 10.6 K/uL Final  . RBC 06/07/2016 3.94* 4.40 - 5.90 MIL/uL Final  . Hemoglobin 06/07/2016 13.8  13.0 - 18.0 g/dL Final  . HCT 06/07/2016 39.1* 40.0 - 52.0 % Final  . MCV 06/07/2016 99.3  80.0 - 100.0 fL Final  . MCH 06/07/2016 35.0* 26.0 - 34.0 pg Final  . MCHC 06/07/2016 35.2  32.0 - 36.0 g/dL Final  . RDW 06/07/2016 20.9* 11.5 - 14.5 % Final  . Platelets 06/07/2016 218  150 - 440 K/uL Final  . Neutrophils Relative % 06/07/2016 63  % Final  . Neutro Abs 06/07/2016 2.7  1.4 - 6.5 K/uL Final  . Lymphocytes Relative 06/07/2016 21  % Final  . Lymphs Abs 06/07/2016 0.9* 1.0 - 3.6 K/uL Final  . Monocytes Relative 06/07/2016 12  % Final  . Monocytes Absolute 06/07/2016 0.5  0.2 - 1.0 K/uL Final  . Eosinophils Relative 06/07/2016 4  % Final  . Eosinophils Absolute 06/07/2016 0.2  0 - 0.7 K/uL Final  .  Basophils Relative 06/07/2016 0  % Final  . Basophils Absolute 06/07/2016 0.0  0 - 0.1 K/uL Final  . Sodium 06/07/2016 137  135 - 145 mmol/L Final  . Potassium 06/07/2016 4.3  3.5 - 5.1 mmol/L Final  . Chloride 06/07/2016 103  101 - 111 mmol/L Final  . CO2 06/07/2016 29  22 - 32 mmol/L Final  . Glucose, Bld 06/07/2016 105* 65 - 99 mg/dL Final  . BUN 06/07/2016 14  6 - 20 mg/dL Final  . Creatinine, Ser 06/07/2016 0.51* 0.61 - 1.24 mg/dL Final  . Calcium 06/07/2016 9.8  8.9 - 10.3 mg/dL Final  . Total Protein 06/07/2016 7.1  6.5 - 8.1 g/dL Final  . Albumin 06/07/2016 3.8  3.5 - 5.0 g/dL Final  . AST 06/07/2016 32  15 - 41 U/L Final  . ALT 06/07/2016 21  17 - 63 U/L Final  . Alkaline Phosphatase 06/07/2016 65  38 - 126 U/L Final  . Total Bilirubin 06/07/2016 0.5  0.3 - 1.2 mg/dL Final  . GFR calc non Af Amer 06/07/2016 >60  >60 mL/min Final  . GFR calc Af Amer 06/07/2016 >60  >60 mL/min Final   Comment: (NOTE) The eGFR has been calculated using the CKD EPI equation. This calculation has not been validated in all clinical situations. eGFR's persistently <60 mL/min signify possible Chronic Kidney Disease.   . Anion gap 06/07/2016 5  5 - 15 Final  . Magnesium 06/07/2016 2.1  1.7 - 2.4 mg/dL Final    Assessment:  Kitt Ledet. is a 83 y.o. male with stage IIC (T4bN0M0) low rectal adenocarcinoma s/p neoadjuvant chemotherapy and radiation followed by surgery.  He presented with rectal bleeding.  Colonoscopy on 07/10/2015 revealed a 4 cm frond-like/villous ulcerated non-obstructing mass in the rectum.  Biopsy revealed adenocarcinoma.  Chest, abdomen, and pelvic CT scan on 07/27/2015 revealed a circumferential soft tissue mass within the low rectum. He was a T12 compression fracture.  CEA was 4.1 on 07/26/2015.  Flexible sigmoidoscopy and  ultrasound on 08/19/2015 revealed a 3.0 x 1.4 cm hypoechoic non-circumferential mass was found in the rectum.  There was no sonographic evidence  suggesting breakthrough of the muscularis propria with invasion into the perirectal fat.  No lymph nodes were seen in the perirectal region and in the left iliac region. Stage was early T3uN0.  He completed 5 weeks of concurrent Xeloda and radiation (09/09/2015 - 10/22/2015).  He underwent robotic assisted APR with end colostomy at [facility] on 12/17/2015 at [facility].  Pathology revealed ypT4bN0M0 (stage IIC) invasive moderately differentiated adenocarcinoma.  Levator muscle was involved.  CEA has been followed:  4.1 on 07/26/2015 and 2.6 on 02/01/2016.  He was admitted to [facility] from 07/24/2015 - 07/30/2015 with acute right basal ganglia hemorrhage and left humeral head fracture s/p a fall on 07/21/2015.  He has 24 hour home care.  He is fairly independent with a walker.  He was admitted to [facility] in [place] from 08/08/2015 - 08/09/2015 with acute diverticulitis.  He was discharged home on Augmentin rather than Flagyl given his alcohol dependency.    He has iron deficiency anemia.  Anemia workup on 03/07/2016 revealed a ferritin of 13, iron saturation 5% and TIBC 469 (high).  B12 was 501. Folate was > 47.  He takes oral B12 daily.  Diet is good.  Ferritin was 43 on 04/19/2016.  He drinks alcohol daily (3 glasses of wine).  He has mild dementia.   He is s/p 5 cycles of adjuvant Xeloda (02/15/2016 - 03/29/2016; 04/25/2016 - 05/16/2016) for planned 6 cycles.  Cycle #3 was truncated after 1 week secondary to palmar plantar erythrodysesthesia (hand foot syndrome).  Cycle #4 Xeloda was dose reduced.  Symptomatically, he denies any complaints.  Exam is unremarkable.  Hematocrit is 39.1.  Ferritin was 43 on 04/19/2016.  Plan: 1.  Labs today:  CBC with diff, CMP, Mg. 2.  Continue last cycle of Xeloda (2 weeks on/1 week off). 3.  Chest, abdomen, and pelvic CT on 07/26/2016. 4.  Labs day prior to scan (CBC with diff, CMP, CEA, ferritin). 5.  RTC after CT scan for assessment and review of imaging.  The patient  was seen and examined.  The assessment and plan was discussed with the patient.  We discussed the plan for reimaging after completion of therapy,  Several questions were asked and answered.    Faythe Casa, NP  Lequita Asal, MD  06/07/2016, 10:06 AM

## 2016-06-07 NOTE — Progress Notes (Signed)
Patient states he is having a little bit of pain on the bottom of his feet from the chemo.  Otherwise, no complaints.

## 2016-06-20 ENCOUNTER — Encounter: Payer: Self-pay | Admitting: Hematology and Oncology

## 2016-07-25 ENCOUNTER — Inpatient Hospital Stay: Payer: Medicare Other | Attending: Hematology and Oncology

## 2016-07-25 DIAGNOSIS — Z87891 Personal history of nicotine dependence: Secondary | ICD-10-CM | POA: Insufficient documentation

## 2016-07-25 DIAGNOSIS — Z933 Colostomy status: Secondary | ICD-10-CM | POA: Insufficient documentation

## 2016-07-25 DIAGNOSIS — I2510 Atherosclerotic heart disease of native coronary artery without angina pectoris: Secondary | ICD-10-CM | POA: Insufficient documentation

## 2016-07-25 DIAGNOSIS — D509 Iron deficiency anemia, unspecified: Secondary | ICD-10-CM | POA: Insufficient documentation

## 2016-07-25 DIAGNOSIS — E538 Deficiency of other specified B group vitamins: Secondary | ICD-10-CM | POA: Diagnosis not present

## 2016-07-25 DIAGNOSIS — Z86010 Personal history of colonic polyps: Secondary | ICD-10-CM | POA: Diagnosis not present

## 2016-07-25 DIAGNOSIS — G8929 Other chronic pain: Secondary | ICD-10-CM | POA: Insufficient documentation

## 2016-07-25 DIAGNOSIS — I10 Essential (primary) hypertension: Secondary | ICD-10-CM | POA: Diagnosis not present

## 2016-07-25 DIAGNOSIS — K281 Acute gastrojejunal ulcer with perforation: Secondary | ICD-10-CM | POA: Diagnosis not present

## 2016-07-25 DIAGNOSIS — E785 Hyperlipidemia, unspecified: Secondary | ICD-10-CM | POA: Insufficient documentation

## 2016-07-25 DIAGNOSIS — Z79899 Other long term (current) drug therapy: Secondary | ICD-10-CM | POA: Insufficient documentation

## 2016-07-25 DIAGNOSIS — R600 Localized edema: Secondary | ICD-10-CM | POA: Insufficient documentation

## 2016-07-25 DIAGNOSIS — M549 Dorsalgia, unspecified: Secondary | ICD-10-CM | POA: Insufficient documentation

## 2016-07-25 DIAGNOSIS — K5730 Diverticulosis of large intestine without perforation or abscess without bleeding: Secondary | ICD-10-CM | POA: Diagnosis not present

## 2016-07-25 DIAGNOSIS — K760 Fatty (change of) liver, not elsewhere classified: Secondary | ICD-10-CM | POA: Diagnosis not present

## 2016-07-25 DIAGNOSIS — Z87311 Personal history of (healed) other pathological fracture: Secondary | ICD-10-CM | POA: Diagnosis not present

## 2016-07-25 DIAGNOSIS — Z923 Personal history of irradiation: Secondary | ICD-10-CM | POA: Insufficient documentation

## 2016-07-25 DIAGNOSIS — C20 Malignant neoplasm of rectum: Secondary | ICD-10-CM | POA: Diagnosis not present

## 2016-07-25 DIAGNOSIS — Z9221 Personal history of antineoplastic chemotherapy: Secondary | ICD-10-CM | POA: Insufficient documentation

## 2016-07-25 LAB — COMPREHENSIVE METABOLIC PANEL
ALT: 20 U/L (ref 17–63)
AST: 33 U/L (ref 15–41)
Albumin: 3.8 g/dL (ref 3.5–5.0)
Alkaline Phosphatase: 59 U/L (ref 38–126)
Anion gap: 4 — ABNORMAL LOW (ref 5–15)
BUN: 12 mg/dL (ref 6–20)
CO2: 29 mmol/L (ref 22–32)
Calcium: 9.8 mg/dL (ref 8.9–10.3)
Chloride: 102 mmol/L (ref 101–111)
Creatinine, Ser: 0.7 mg/dL (ref 0.61–1.24)
GFR calc Af Amer: >60 mL/min (ref >60)
GFR calc non Af Amer: >60 mL/min (ref >60)
Glucose, Bld: 96 mg/dL (ref 65–99)
Potassium: 4.3 mmol/L (ref 3.5–5.1)
Sodium: 135 mmol/L (ref 135–145)
Total Bilirubin: 0.8 mg/dL (ref 0.3–1.2)
Total Protein: 7.1 g/dL (ref 6.5–8.1)

## 2016-07-25 LAB — CBC WITH DIFFERENTIAL/PLATELET
Basophils Absolute: 0 10*3/uL (ref 0–0.1)
Basophils Relative: 0 %
Eosinophils Absolute: 0.2 10*3/uL (ref 0–0.7)
Eosinophils Relative: 4 %
HCT: 41.4 % (ref 40.0–52.0)
Hemoglobin: 14.4 g/dL (ref 13.0–18.0)
Lymphocytes Relative: 22 %
Lymphs Abs: 1.1 10*3/uL (ref 1.0–3.6)
MCH: 35.2 pg — ABNORMAL HIGH (ref 26.0–34.0)
MCHC: 34.9 g/dL (ref 32.0–36.0)
MCV: 100.9 fL — ABNORMAL HIGH (ref 80.0–100.0)
Monocytes Absolute: 0.6 10*3/uL (ref 0.2–1.0)
Monocytes Relative: 12 %
Neutro Abs: 3.1 10*3/uL (ref 1.4–6.5)
Neutrophils Relative %: 62 %
Platelets: 213 10*3/uL (ref 150–440)
RBC: 4.1 MIL/uL — ABNORMAL LOW (ref 4.40–5.90)
RDW: 15.7 % — ABNORMAL HIGH (ref 11.5–14.5)
WBC: 5 10*3/uL (ref 3.8–10.6)

## 2016-07-25 LAB — FERRITIN: Ferritin: 99 ng/mL (ref 24–336)

## 2016-07-26 ENCOUNTER — Ambulatory Visit
Admission: RE | Admit: 2016-07-26 | Discharge: 2016-07-26 | Disposition: A | Discharge status: Home / Self Care | Payer: Medicare Other | Source: Ambulatory Visit | Attending: Hematology and Oncology | Admitting: Hematology and Oncology

## 2016-07-26 DIAGNOSIS — C20 Malignant neoplasm of rectum: Secondary | ICD-10-CM | POA: Insufficient documentation

## 2016-07-26 DIAGNOSIS — I700 Atherosclerosis of aorta: Secondary | ICD-10-CM | POA: Insufficient documentation

## 2016-07-26 DIAGNOSIS — M4856XA Collapsed vertebra, not elsewhere classified, lumbar region, initial encounter for fracture: Secondary | ICD-10-CM | POA: Diagnosis not present

## 2016-07-26 DIAGNOSIS — K5730 Diverticulosis of large intestine without perforation or abscess without bleeding: Secondary | ICD-10-CM | POA: Diagnosis not present

## 2016-07-26 DIAGNOSIS — I2510 Atherosclerotic heart disease of native coronary artery without angina pectoris: Secondary | ICD-10-CM | POA: Diagnosis not present

## 2016-07-26 LAB — CEA: CEA: 2.9 ng/mL (ref 0.0–4.7)

## 2016-07-26 MED ORDER — IOPAMIDOL (ISOVUE-300) INJECTION 61%
100.0000 mL | Freq: Once | INTRAVENOUS | Status: AC | PRN
  Administered 2016-07-26: 100 mL via INTRAVENOUS

## 2016-07-29 ENCOUNTER — Inpatient Hospital Stay (HOSPITAL_BASED_OUTPATIENT_CLINIC_OR_DEPARTMENT_OTHER): Payer: Medicare Other | Admitting: Hematology and Oncology

## 2016-07-29 ENCOUNTER — Encounter: Payer: Self-pay | Admitting: Hematology and Oncology

## 2016-07-29 VITALS — BP 121/63 | HR 49 | Temp 97.2°F | Resp 18 | Wt 143.1 lb

## 2016-07-29 DIAGNOSIS — Z9221 Personal history of antineoplastic chemotherapy: Secondary | ICD-10-CM

## 2016-07-29 DIAGNOSIS — E538 Deficiency of other specified B group vitamins: Secondary | ICD-10-CM

## 2016-07-29 DIAGNOSIS — Z933 Colostomy status: Secondary | ICD-10-CM | POA: Diagnosis not present

## 2016-07-29 DIAGNOSIS — Z87891 Personal history of nicotine dependence: Secondary | ICD-10-CM

## 2016-07-29 DIAGNOSIS — G8929 Other chronic pain: Secondary | ICD-10-CM

## 2016-07-29 DIAGNOSIS — E785 Hyperlipidemia, unspecified: Secondary | ICD-10-CM | POA: Diagnosis not present

## 2016-07-29 DIAGNOSIS — R600 Localized edema: Secondary | ICD-10-CM

## 2016-07-29 DIAGNOSIS — I2510 Atherosclerotic heart disease of native coronary artery without angina pectoris: Secondary | ICD-10-CM

## 2016-07-29 DIAGNOSIS — K281 Acute gastrojejunal ulcer with perforation: Secondary | ICD-10-CM

## 2016-07-29 DIAGNOSIS — Z87311 Personal history of (healed) other pathological fracture: Secondary | ICD-10-CM

## 2016-07-29 DIAGNOSIS — K5730 Diverticulosis of large intestine without perforation or abscess without bleeding: Secondary | ICD-10-CM

## 2016-07-29 DIAGNOSIS — Z86010 Personal history of colonic polyps: Secondary | ICD-10-CM

## 2016-07-29 DIAGNOSIS — I10 Essential (primary) hypertension: Secondary | ICD-10-CM

## 2016-07-29 DIAGNOSIS — D509 Iron deficiency anemia, unspecified: Secondary | ICD-10-CM

## 2016-07-29 DIAGNOSIS — C20 Malignant neoplasm of rectum: Secondary | ICD-10-CM

## 2016-07-29 DIAGNOSIS — Z79899 Other long term (current) drug therapy: Secondary | ICD-10-CM

## 2016-07-29 DIAGNOSIS — Z923 Personal history of irradiation: Secondary | ICD-10-CM | POA: Diagnosis not present

## 2016-07-29 DIAGNOSIS — K760 Fatty (change of) liver, not elsewhere classified: Secondary | ICD-10-CM

## 2016-07-29 DIAGNOSIS — M549 Dorsalgia, unspecified: Secondary | ICD-10-CM

## 2016-07-29 NOTE — Progress Notes (Signed)
[facility] day: 07/29/2016   Chief Complaint: Kenneth Doo. is a 83 y.o. male with rectal cancer s/p neoadjuvant Xeloda and radiation who is seen for review of end of therapy scans.   HPI:  The patient was last seen in the medical oncology clinic on 06/07/2016.  At that time, he was feeling good.  He had no recurrent of hand foot syndrome on reduced dose Xeloda.  He began his last cycle of Xeloda on 06/06/2016.  Labs on 07/25/2016 revealed a hematocrit of 41.4, hemoglobin 14.4, MCV 100.9. platelets 213,000, WBC 5000 with an ANC of 3100.  Creatinine was 0.70.  LFTs were normal.  Ferritin was 99.  Chest, abdomen, and pelvic CT scan on 07/26/2016 revealed smooth uniform soft tissue thickening in the presacral space, compatible with posttreatment change.  There were no findings suspicious for local tumor recurrence in the pelvis.  There was no evidence of metastatic disease in the chest, abdomen or pelvis.  There was moderate superior L1 vertebral compression fracture of indeterminate chronicity, new since 08/08/2015.   There was no appreciable associated osseous lesion.  There was aortic atherosclerosis, left main and 3 vessel coronary atherosclerosis, chronic T12 vertebral compression fracture and mild colonic diverticulosis.  Symptomatically, he feels pretty good. His hands and feet are fine.  He denies any bowel concerns.   Past Medical History:  Diagnosis Date  . Allergic rhinitis   . Anemia   . B12 deficiency 06/29/2015  . Benign neoplasm of ascending colon   . Benign neoplasm of descending colon   . Colon polyp   . DD (diverticular disease) 06/29/2015  . Dementia    memory issues  . Fatty liver   . GERD (gastroesophageal reflux disease)   . Glaucoma   . Hyperlipidemia   . Hypertension   . Rectal adenocarcinoma ([place]) 07/15/2015  . Substance abuse    alcohol  . Traumatic intraparenchymal hemorrhage [facility])     Past Surgical  History:  Procedure Laterality Date  . CATARACT EXTRACTION W/ INTRAOCULAR LENS IMPLANT    . COLONOSCOPY WITH PROPOFOL N/A 07/10/2015   Procedure: COLONOSCOPY WITH PROPOFOL;  Surgeon: Lucilla Lame, MD;  Location: [facility];  Service: Endoscopy;  Laterality: N/A;  PER CAREGIVER WAS TOLD THAT PT WOULD BE 1ST (KEEP PT EARLY AM)  . POLYPECTOMY  07/10/2015   Procedure: POLYPECTOMY;  Surgeon: Lucilla Lame, MD;  Location: [place] [place];  Service: Endoscopy;;    Family History  Problem Relation Age of Onset  . Cancer Father     Prostate    Social History:  reports that he quit smoking about 38 years ago. His smoking use included Pipe. He has never used smokeless tobacco. He reports that he drinks about 16.8 oz of alcohol per week . He reports that he does not use drugs.  He drinks 3 glasses of wine a day.  The patient is widowed.  He has no children.  The patient is accompanied by his caregiver, [name], today.  Allergies: No Known Allergies  Current Medications: Current Outpatient Prescriptions  Medication Sig Dispense Refill  . brimonidine-timolol (COMBIGAN) 0.2-0.5 % ophthalmic solution Place 1 drop into both eyes 2 (two) times daily.    . capecitabine (XELODA) 500 MG tablet Take 3 tablets (1,500 mg total) by mouth 2 (two) times daily after a meal. Take on days 1-14. Repeat every 21 days. 84 tablet 1  . cetirizine (ZYRTEC) 10 MG tablet Take 10 mg  by mouth daily.     . cholecalciferol (VITAMIN D) 1000 units tablet Take 1,000 Units by mouth daily.    . clobetasol cream (TEMOVATE) 0.05 % Apply topically.    . docusate sodium (COLACE) 100 MG capsule Take 100 mg by mouth daily.    Marland Kitchen donepezil (ARICEPT) 10 MG tablet Take 10 mg by mouth at bedtime.    . fluticasone (FLONASE) 50 MCG/ACT nasal spray Place 2 sprays into both nostrils daily as needed for rhinitis.     . folic acid (FOLVITE) 1 MG tablet Take 1 tablet (1 mg total) by mouth daily. 30 tablet 2  . furosemide (LASIX) 20 MG  tablet Take by mouth.    . gabapentin (NEURONTIN) 100 MG capsule     . hydrocortisone (ANUSOL-HC) 25 MG suppository Place 1 suppository (25 mg total) rectally 2 (two) times daily. 12 suppository 0  . Iron-Vitamin C (IRON 100/C PO) Take by mouth.    . lidocaine (XYLOCAINE) 5 % ointment   0  . meloxicam (MOBIC) 15 MG tablet Take 15 mg by mouth.    . metoprolol succinate (TOPROL XL) 25 MG 24 hr tablet Take 1 tablet (25 mg total) by mouth daily. 30 tablet 0  . montelukast (SINGULAIR) 10 MG tablet Take 10 mg by mouth at bedtime. Reported on 08/21/2015    . mupirocin ointment (BACTROBAN) 2 % Apply 1 application topically daily as needed.   0  . omeprazole (PRILOSEC) 20 MG capsule Take 1 capsule (20 mg total) by mouth daily. 30 capsule 0  . ondansetron (ZOFRAN) 8 MG tablet Take 1 tablet (8 mg total) by mouth every 8 (eight) hours as needed for nausea or vomiting. 20 tablet 1  . ondansetron (ZOFRAN) 8 MG tablet Take 1 tablet (8 mg total) by mouth 2 (two) times daily as needed (Nausea or vomiting). 30 tablet 1  . thiamine 100 MG tablet Take 1 tablet (100 mg total) by mouth daily. 30 tablet 2  . valsartan (DIOVAN) 80 MG tablet Take 80 mg by mouth daily.    . vitamin B-12 (CYANOCOBALAMIN) 1000 MCG tablet Take 1,000 mcg by mouth daily.     No current facility-administered medications for this visit.     Review of Systems:  GENERAL:  Feels fine.  No fevers or sweats .  Weight up 7 pounds. PERFORMANCE STATUS (ECOG):  2 HEENT: No visual changes, sore throat, mouth sores or tenderness. Lungs: No shortness of breath or cough.  No hemoptysis. Cardiac:  No chest pain, palpitations, orthopnea, or PND. GI:  Ostomy.  No nausea, vomiting, diarrhea, or constipation. GU:  No urgency, frequency, dysuria, or hematuria. Musculoskeletal:  Chronic back pain secondary to T12 compression fracture.  No pain or muscle tenderness. Extremities:  Mild lower extremity swelling (takes Lasix prn). Skin:  Feet and hands are  healed.  No rashes, ulcers or skin changes.  Neuro:  Mild dementia.  Balance and coordination off (chronic).  No headache, numbness or weakness. Endocrine:  No diabetes, thyroid issues, hot flashes or night sweats. Psych:  No mood changes, depression or anxiety. Pain: No pain. Review of systems:  All other systems reviewed and found to be negative.  Physical Exam: Blood pressure 121/63, pulse (!) 49, temperature 97.2 F (36.2 C), temperature source Tympanic, resp. rate 18, weight 143 lb 1 oz (64.9 kg). GENERAL:  Well developed, well nourished, elderly gentleman sitting comfortably in the exam room in no acute distress.  He has a 4 point cane at his side  MENTAL STATUS:  Alert and oriented to person, place and time. HEAD: Pearline Cables hair.  Normocephalic, atraumatic, face symmetric, no Cushingoid features. EYES:  Glasses.  Blue eyes.  Pupils equal round and reactive to light and accomodation.  No conjunctivitis or scleral icterus. ENT:  Oropharynx clear without lesion.  Tongue normal.  Mucous membranes moist.  RESPIRATORY:  Clear to auscultation without rales, wheezes or rhonchi. CARDIOVASCULAR:  Regular rate and rhythm without murmur, rub or gallop. ABDOMEN:  Ostomy.  Soft, non-tender, with active bowel sounds, and no hepatosplenomegaly.  No masses. SKIN:  No rashes, skin changes or ulcers.  No hand foot syndrome. EXTREMITIES:  Improving grade II hand-foot syndrome.  No edema, no skin discoloration or tenderness.  No palpable cords. LYMPH NODES: No palpable cervical, supraclavicular, axillary or inguinal adenopathy  NEUROLOGICAL: Unremarkable. PSYCH:  Appropriate.   No visits with results within 3 Day(s) from this visit.  Latest known visit with results is:  Appointment on 07/25/2016  Component Date Value Ref Range Status  . CEA 07/25/2016 2.9  0.0 - 4.7 ng/mL Final   Comment: (NOTE)       Roche ECLIA methodology       Nonsmokers  <3.9                                     Smokers      <5.6 Performed At: [facility] [place], [place] [id number] Lindon Romp MD UX:[id number]   . WBC 07/25/2016 5.0  3.8 - 10.6 K/uL Final  . RBC 07/25/2016 4.10* 4.40 - 5.90 MIL/uL Final  . Hemoglobin 07/25/2016 14.4  13.0 - 18.0 g/dL Final  . HCT 07/25/2016 41.4  40.0 - 52.0 % Final  . MCV 07/25/2016 100.9* 80.0 - 100.0 fL Final  . MCH 07/25/2016 35.2* 26.0 - 34.0 pg Final  . MCHC 07/25/2016 34.9  32.0 - 36.0 g/dL Final  . RDW 07/25/2016 15.7* 11.5 - 14.5 % Final  . Platelets 07/25/2016 213  150 - 440 K/uL Final  . Neutrophils Relative % 07/25/2016 62  % Final  . Neutro Abs 07/25/2016 3.1  1.4 - 6.5 K/uL Final  . Lymphocytes Relative 07/25/2016 22  % Final  . Lymphs Abs 07/25/2016 1.1  1.0 - 3.6 K/uL Final  . Monocytes Relative 07/25/2016 12  % Final  . Monocytes Absolute 07/25/2016 0.6  0.2 - 1.0 K/uL Final  . Eosinophils Relative 07/25/2016 4  % Final  . Eosinophils Absolute 07/25/2016 0.2  0 - 0.7 K/uL Final  . Basophils Relative 07/25/2016 0  % Final  . Basophils Absolute 07/25/2016 0.0  0 - 0.1 K/uL Final  . Sodium 07/25/2016 135  135 - 145 mmol/L Final  . Potassium 07/25/2016 4.3  3.5 - 5.1 mmol/L Final  . Chloride 07/25/2016 102  101 - 111 mmol/L Final  . CO2 07/25/2016 29  22 - 32 mmol/L Final  . Glucose, Bld 07/25/2016 96  65 - 99 mg/dL Final  . BUN 07/25/2016 12  6 - 20 mg/dL Final  . Creatinine, Ser 07/25/2016 0.70  0.61 - 1.24 mg/dL Final  . Calcium 07/25/2016 9.8  8.9 - 10.3 mg/dL Final  . Total Protein 07/25/2016 7.1  6.5 - 8.1 g/dL Final  . Albumin 07/25/2016 3.8  3.5 - 5.0 g/dL Final  . AST 07/25/2016 33  15 - 41 U/L Final  . ALT 07/25/2016 20  17 - 63  U/L Final  . Alkaline Phosphatase 07/25/2016 59  38 - 126 U/L Final  . Total Bilirubin 07/25/2016 0.8  0.3 - 1.2 mg/dL Final  . GFR calc non Af Amer 07/25/2016 >60  >60 mL/min Final  . GFR calc Af Amer 07/25/2016 >60  >60 mL/min Final   Comment: (NOTE) The eGFR has been calculated using  the CKD EPI equation. This calculation has not been validated in all clinical situations. eGFR's persistently <60 mL/min signify possible Chronic Kidney Disease.   . Anion gap 07/25/2016 4* 5 - 15 Final  . Ferritin 07/25/2016 99  24 - 336 ng/mL Final    Assessment:  Kenneth Luna. is a 83 y.o. male with stage IIC (T4bN0M0) low rectal adenocarcinoma s/p neoadjuvant chemotherapy and radiation followed by surgery.  He presented with rectal bleeding.  Colonoscopy on 07/10/2015 revealed a 4 cm frond-like/villous ulcerated non-obstructing mass in the rectum.  Biopsy revealed adenocarcinoma.  Chest, abdomen, and pelvic CT scan on 07/27/2015 revealed a circumferential soft tissue mass within the low rectum. He was a T12 compression fracture.  CEA was 4.1 on 07/26/2015.  Flexible sigmoidoscopy and ultrasound on 08/19/2015 revealed a 3.0 x 1.4 cm hypoechoic non-circumferential mass was found in the rectum.  There was no sonographic evidence suggesting breakthrough of the muscularis propria with invasion into the perirectal fat.  No lymph nodes were seen in the perirectal region and in the left iliac region. Stage was early T3uN0.  He received 5 weeks of concurrent Xeloda and radiation (09/09/2015 - 10/22/2015).  He underwent robotic assisted APR with end colostomy at [facility] on 12/17/2015 at [facility].  Pathology revealed ypT4bN0M0 (stage IIC) invasive moderately differentiated adenocarcinoma.  Levator muscle was involved.  He received 6 cycles of adjuvant Xeloda (02/15/2016 -  06/06/2016).  Cycle #3 was truncated after 1 week secondary to palmar plantar erythrodysesthesia (hand foot syndrome).  Xeloda was dose reduced with cycle #4.  Chest, abdomen, and pelvic CT on 07/26/2016 revealed smooth uniform soft tissue thickening in the presacral space, compatible with posttreatment change.  There were no findings suspicious for local tumor recurrence in the pelvis.  There was no evidence of metastatic disease in  the chest, abdomen or pelvis.  There was moderate superior L1 vertebral compression fracture of indeterminate chronicity, new since 08/08/2015.   CEA has been followed:  4.1 on 07/26/2015, 2.6 on 02/01/2016, and 2.9 on 07/25/2016.  He was admitted to [facility] from 07/24/2015 - 07/30/2015 with acute right basal ganglia hemorrhage and left humeral head fracture s/p a fall on 07/21/2015.  He has 24 hour home care.  He is fairly independent with a walker.  He was admitted to [facility] in [place] from 08/08/2015 - 08/09/2015 with acute diverticulitis.  He was discharged home on Augmentin rather than Flagyl given his alcohol dependency.    He has iron deficiency anemia.  Anemia workup on 03/07/2016 revealed a ferritin of 13, iron saturation 5% and TIBC 469 (high).  B12 was 501. Folate was > 47.  He takes oral B12 daily.  Diet is good.  Ferritin was 43 on 04/19/2016 and 99 on 07/25/2016.  He drinks alcohol daily (3 glasses of wine).  He has mild dementia.   Symptomatically, he feels good.  Exam is normal.  Plan: 1.  Review interval labs and scans.  No evidence of disease.  Discuss L1 compression fracture, unrelated to malignancy. 2.  Discontinue oral iron. 3.  RTC in 3 months for MD assess and labs (CBC with  diff, CMP, CEA, ferritin).   Lequita Asal, MD  07/29/2016, 10:46 AM

## 2016-07-29 NOTE — Progress Notes (Signed)
Patient offers no concerns today. 

## 2016-09-16 ENCOUNTER — Ambulatory Visit: Payer: Medicare Other | Admitting: Radiation Oncology

## 2016-10-03 DIAGNOSIS — Z7185 Encounter for immunization safety counseling: Secondary | ICD-10-CM | POA: Insufficient documentation

## 2016-10-03 DIAGNOSIS — Z0000 Encounter for general adult medical examination without abnormal findings: Secondary | ICD-10-CM | POA: Insufficient documentation

## 2016-10-10 ENCOUNTER — Encounter: Payer: Self-pay | Admitting: Radiation Oncology

## 2016-10-10 ENCOUNTER — Ambulatory Visit
Admission: RE | Admit: 2016-10-10 | Discharge: 2016-10-10 | Disposition: A | Discharge status: Home / Self Care | Payer: Medicare Other | Source: Ambulatory Visit | Attending: Radiation Oncology | Admitting: Radiation Oncology

## 2016-10-10 VITALS — BP 117/66 | HR 48 | Temp 94.9°F | Resp 18 | Wt 144.3 lb

## 2016-10-10 DIAGNOSIS — Z85048 Personal history of other malignant neoplasm of rectum, rectosigmoid junction, and anus: Secondary | ICD-10-CM | POA: Diagnosis not present

## 2016-10-10 DIAGNOSIS — Z923 Personal history of irradiation: Secondary | ICD-10-CM | POA: Insufficient documentation

## 2016-10-10 DIAGNOSIS — C20 Malignant neoplasm of rectum: Secondary | ICD-10-CM

## 2016-10-10 NOTE — Progress Notes (Signed)
Radiation Oncology Follow up Note  Name: Kenneth Luna.   Date:   10/10/2016 MRN:  [id number] DOB: 1932/07/13    This 83 y.o. male presents to the clinic today for 10 month follow-up status post new adjuvant treatment prior to surgery for rectal cancer.  REFERRING PROVIDER: Dion Body, MD  HPI: Patient is an 83 year old male now seen out 10 months having completed combined modality treatment with chemoradiation prior to surgical resection for rectal cancer.Marland Kitchen His initial tumor was stage IIa he is seen today in routine follow-up and is doing well. He did have an AP resection with pathology showing residual viable invasive adenocarcinoma with margins clear and 14 lymph nodes negative. He is seen today in routine follow-up is doing well his colostomy is functioning well. He had a scan back in April 2018 note showing no findings suspicious for local tumor recurrence. There was no metastatic disease in chest abdomen or pelvis.  COMPLICATIONS OF TREATMENT: none  FOLLOW UP COMPLIANCE: keeps appointments   PHYSICAL EXAM:  BP 117/66   Pulse (!) 48   Temp (!) 94.9 F (34.9 C)   Resp 18   Wt 144 lb 4.7 oz (65.5 kg)   BMI 24.01 kg/m  Elderly male in NAD with functioning colostomy. Anal scar is healed well. Well-developed well-nourished patient in NAD. HEENT reveals PERLA, EOMI, discs not visualized.  Oral cavity is clear. No oral mucosal lesions are identified. Neck is clear without evidence of cervical or supraclavicular adenopathy. Lungs are clear to A&P. Cardiac examination is essentially unremarkable with regular rate and rhythm without murmur rub or thrill. Abdomen is benign with no organomegaly or masses noted. Motor sensory and DTR levels are equal and symmetric in the upper and lower extremities. Cranial nerves II through XII are grossly intact. Proprioception is intact. No peripheral adenopathy or edema is identified. No motor or sensory levels are noted. Crude visual fields  are within normal range.  RADIOLOGY RESULTS: CT scan is reviewed and compatible with the above-stated findings  PLAN: Present time patient is doing well with no evidence of disease. I'm please was overall progress. I've asked to see him back in 1 year for follow-up. Patient is to call sooner with any concerns. He continues close follow-up care with surgeon's and medical oncology.  I would like to take this opportunity to thank you for allowing me to participate in the care of your patient.Armstead Peaks., MD

## 2016-10-28 ENCOUNTER — Inpatient Hospital Stay (HOSPITAL_BASED_OUTPATIENT_CLINIC_OR_DEPARTMENT_OTHER): Payer: Medicare Other | Admitting: Hematology and Oncology

## 2016-10-28 ENCOUNTER — Encounter: Payer: Self-pay | Admitting: Hematology and Oncology

## 2016-10-28 ENCOUNTER — Inpatient Hospital Stay: Payer: Medicare Other | Attending: Hematology and Oncology

## 2016-10-28 VITALS — BP 119/69 | HR 48 | Temp 95.9°F | Resp 18 | Wt 144.1 lb

## 2016-10-28 DIAGNOSIS — C20 Malignant neoplasm of rectum: Secondary | ICD-10-CM | POA: Diagnosis not present

## 2016-10-28 DIAGNOSIS — F0390 Unspecified dementia without behavioral disturbance: Secondary | ICD-10-CM

## 2016-10-28 DIAGNOSIS — Z923 Personal history of irradiation: Secondary | ICD-10-CM | POA: Diagnosis not present

## 2016-10-28 DIAGNOSIS — I10 Essential (primary) hypertension: Secondary | ICD-10-CM

## 2016-10-28 DIAGNOSIS — K219 Gastro-esophageal reflux disease without esophagitis: Secondary | ICD-10-CM | POA: Diagnosis not present

## 2016-10-28 DIAGNOSIS — E538 Deficiency of other specified B group vitamins: Secondary | ICD-10-CM | POA: Diagnosis not present

## 2016-10-28 DIAGNOSIS — S22080A Wedge compression fracture of T11-T12 vertebra, initial encounter for closed fracture: Secondary | ICD-10-CM

## 2016-10-28 DIAGNOSIS — E785 Hyperlipidemia, unspecified: Secondary | ICD-10-CM | POA: Insufficient documentation

## 2016-10-28 DIAGNOSIS — Z86010 Personal history of colonic polyps: Secondary | ICD-10-CM | POA: Insufficient documentation

## 2016-10-28 DIAGNOSIS — Z87891 Personal history of nicotine dependence: Secondary | ICD-10-CM | POA: Insufficient documentation

## 2016-10-28 DIAGNOSIS — Z79899 Other long term (current) drug therapy: Secondary | ICD-10-CM

## 2016-10-28 DIAGNOSIS — Z9221 Personal history of antineoplastic chemotherapy: Secondary | ICD-10-CM | POA: Insufficient documentation

## 2016-10-28 DIAGNOSIS — K760 Fatty (change of) liver, not elsewhere classified: Secondary | ICD-10-CM | POA: Insufficient documentation

## 2016-10-28 DIAGNOSIS — Z8042 Family history of malignant neoplasm of prostate: Secondary | ICD-10-CM | POA: Diagnosis not present

## 2016-10-28 DIAGNOSIS — D509 Iron deficiency anemia, unspecified: Secondary | ICD-10-CM | POA: Diagnosis not present

## 2016-10-28 LAB — COMPREHENSIVE METABOLIC PANEL
ALT: 21 U/L (ref 17–63)
AST: 31 U/L (ref 15–41)
Albumin: 3.7 g/dL (ref 3.5–5.0)
Alkaline Phosphatase: 58 U/L (ref 38–126)
Anion gap: 7 (ref 5–15)
BUN: 18 mg/dL (ref 6–20)
CO2: 27 mmol/L (ref 22–32)
Calcium: 9.7 mg/dL (ref 8.9–10.3)
Chloride: 101 mmol/L (ref 101–111)
Creatinine, Ser: 0.71 mg/dL (ref 0.61–1.24)
GFR calc Af Amer: >60 mL/min (ref >60)
GFR calc non Af Amer: >60 mL/min (ref >60)
Glucose, Bld: 83 mg/dL (ref 65–99)
Potassium: 4.3 mmol/L (ref 3.5–5.1)
Sodium: 135 mmol/L (ref 135–145)
Total Bilirubin: 0.7 mg/dL (ref 0.3–1.2)
Total Protein: 6.8 g/dL (ref 6.5–8.1)

## 2016-10-28 LAB — CBC WITH DIFFERENTIAL/PLATELET
Basophils Absolute: 0.1 10*3/uL (ref 0–0.1)
Basophils Relative: 1 %
Eosinophils Absolute: 0.2 10*3/uL (ref 0–0.7)
Eosinophils Relative: 4 %
HCT: 42.5 % (ref 40.0–52.0)
Hemoglobin: 14.8 g/dL (ref 13.0–18.0)
Lymphocytes Relative: 22 %
Lymphs Abs: 1.2 10*3/uL (ref 1.0–3.6)
MCH: 32.9 pg (ref 26.0–34.0)
MCHC: 34.9 g/dL (ref 32.0–36.0)
MCV: 94.2 fL (ref 80.0–100.0)
Monocytes Absolute: 0.7 10*3/uL (ref 0.2–1.0)
Monocytes Relative: 12 %
Neutro Abs: 3.4 10*3/uL (ref 1.4–6.5)
Neutrophils Relative %: 61 %
Platelets: 204 10*3/uL (ref 150–440)
RBC: 4.51 MIL/uL (ref 4.40–5.90)
RDW: 12.9 % (ref 11.5–14.5)
WBC: 5.6 10*3/uL (ref 3.8–10.6)

## 2016-10-28 LAB — FERRITIN: Ferritin: 50 ng/mL (ref 24–336)

## 2016-10-28 NOTE — Progress Notes (Signed)
Patient offers no complaints today. 

## 2016-10-28 NOTE — Progress Notes (Signed)
[facility] day: 10/28/2016   Chief Complaint: Kenneth Luna. is a 83 y.o. male with stage IIC rectal cancer who is seen for 3 month assessment.  HPI:  The patient was last seen in the medical oncology clinic on 07/29/2016.  At that time, he felt pretty good.  Hand foot syndrome had resolved.  CEA was normal.  Ferritin was 99.  Oral iron was discontinued.  During the interim, he has done well. He notes no problems.  He denies any abdominal complaints.  He denies any melena or hematochezia.   Past Medical History:  Diagnosis Date  . Allergic rhinitis   . Anemia   . B12 deficiency 06/29/2015  . Benign neoplasm of ascending colon   . Benign neoplasm of descending colon   . Colon polyp   . DD (diverticular disease) 06/29/2015  . Dementia    memory issues  . Fatty liver   . GERD (gastroesophageal reflux disease)   . Glaucoma   . Hyperlipidemia   . Hypertension   . Rectal adenocarcinoma ([place]) 07/15/2015  . Substance abuse    alcohol  . Traumatic intraparenchymal hemorrhage [facility])     Past Surgical History:  Procedure Laterality Date  . CATARACT EXTRACTION W/ INTRAOCULAR LENS IMPLANT    . COLONOSCOPY WITH PROPOFOL N/A 07/10/2015   Procedure: COLONOSCOPY WITH PROPOFOL;  Surgeon: Lucilla Lame, MD;  Location: [place];  Service: Endoscopy;  Laterality: N/A;  PER CAREGIVER WAS TOLD THAT PT WOULD BE 1ST (KEEP PT EARLY AM)  . POLYPECTOMY  07/10/2015   Procedure: POLYPECTOMY;  Surgeon: Lucilla Lame, MD;  Location: [place];  Service: Endoscopy;;    Family History  Problem Relation Age of Onset  . Cancer Father        Prostate    Social History:  reports that he quit smoking about 38 years ago. His smoking use included Pipe. He has never used smokeless tobacco. He reports that he drinks about 16.8 oz of alcohol per week . He reports that he does not use drugs.  He drinks 3 glasses of wine a day.  The patient is  widowed.  He has no children.  The patient is accompanied by his caregiver, [name], today.  Allergies: No Known Allergies  Current Medications: Current Outpatient Prescriptions  Medication Sig Dispense Refill  . brimonidine-timolol (COMBIGAN) 0.2-0.5 % ophthalmic solution Place 1 drop into both eyes 2 (two) times daily.    . cetirizine (ZYRTEC) 10 MG tablet Take 10 mg by mouth daily.     . cholecalciferol (VITAMIN D) 1000 units tablet Take 1,000 Units by mouth daily.    . clobetasol cream (TEMOVATE) 0.05 % Apply topically.    . docusate sodium (COLACE) 100 MG capsule Take 100 mg by mouth daily.    Marland Kitchen donepezil (ARICEPT) 10 MG tablet Take 10 mg by mouth at bedtime.    . fluticasone (FLONASE) 50 MCG/ACT nasal spray Place 2 sprays into both nostrils daily as needed for rhinitis.     . folic acid (FOLVITE) 1 MG tablet Take 1 tablet (1 mg total) by mouth daily. 30 tablet 2  . furosemide (LASIX) 20 MG tablet Take by mouth.    . gabapentin (NEURONTIN) 100 MG capsule     . hydrocortisone (ANUSOL-HC) 25 MG suppository Place 1 suppository (25 mg total) rectally 2 (two) times daily. 12 suppository 0  . Iron-Vitamin C (IRON 100/C PO) Take by mouth.    Marland Kitchen  lidocaine (XYLOCAINE) 5 % ointment   0  . metoprolol succinate (TOPROL XL) 25 MG 24 hr tablet Take 1 tablet (25 mg total) by mouth daily. 30 tablet 0  . montelukast (SINGULAIR) 10 MG tablet Take 10 mg by mouth at bedtime. Reported on 08/21/2015    . mupirocin ointment (BACTROBAN) 2 % Apply 1 application topically daily as needed.   0  . omeprazole (PRILOSEC) 20 MG capsule Take 1 capsule (20 mg total) by mouth daily. 30 capsule 0  . ondansetron (ZOFRAN) 8 MG tablet Take 1 tablet (8 mg total) by mouth every 8 (eight) hours as needed for nausea or vomiting. 20 tablet 1  . thiamine 100 MG tablet Take 1 tablet (100 mg total) by mouth daily. 30 tablet 2  . valsartan (DIOVAN) 80 MG tablet Take 80 mg by mouth daily.    . vitamin B-12 (CYANOCOBALAMIN) 1000  MCG tablet Take 1,000 mcg by mouth daily.    . capecitabine (XELODA) 500 MG tablet Take 3 tablets (1,500 mg total) by mouth 2 (two) times daily after a meal. Take on days 1-14. Repeat every 21 days. (Patient not taking: Reported on 10/28/2016) 84 tablet 1   No current facility-administered medications for this visit.     Review of Systems:  GENERAL:  Feels good.  No fevers or sweats .  Weight up 4 pounds. PERFORMANCE STATUS (ECOG):  2 HEENT: No visual changes, sore throat, mouth sores or tenderness. Lungs: No shortness of breath or cough.  No hemoptysis. Cardiac:  No chest pain, palpitations, orthopnea, or PND. GI:  Ostomy.  No nausea, vomiting, diarrhea, or constipation. GU:  No urgency, frequency, dysuria, or hematuria. Musculoskeletal:  Chronic back pain secondary to T12 compression fracture.  No pain or muscle tenderness. Extremities:  Mild lower extremity swelling (takes Lasix prn). Skin:  No rashes, ulcers or skin changes.  Neuro:  Mild dementia.  Balance and coordination off (chronic).  No headache, numbness or weakness. Endocrine:  No diabetes, thyroid issues, hot flashes or night sweats. Psych:  No mood changes, depression or anxiety. Pain: No pain. Review of systems:  All other systems reviewed and found to be negative.  Physical Exam: Blood pressure 119/69, pulse (!) 48, temperature (!) 95.9 F (35.5 C), temperature source Tympanic, resp. rate 18, weight 144 lb 1 oz (65.3 kg). GENERAL:  Well developed, well nourished, elderly gentleman sitting comfortably in the exam room in no acute distress.  He has a 4 point cane at his side MENTAL STATUS:  Alert and oriented to person, place and time. HEAD: Pearline Cables hair.  Normocephalic, atraumatic, face symmetric, no Cushingoid features. EYES:  Glasses.  Blue eyes.  Pupils equal round and reactive to light and accomodation.  No conjunctivitis or scleral icterus. ENT:  Oropharynx clear without lesion.  Tongue normal.  Mucous membranes moist.   RESPIRATORY:  Clear to auscultation without rales, wheezes or rhonchi. CARDIOVASCULAR:  Regular rate and rhythm without murmur, rub or gallop. ABDOMEN:  Ostomy.  Soft, non-tender, with active bowel sounds, and no hepatosplenomegaly.  No masses. SKIN:  No rashes, skin changes or ulcers.  No hand foot syndrome. EXTREMITIES:  No edema, no skin discoloration or tenderness.  No palpable cords. LYMPH NODES: No palpable cervical, supraclavicular, axillary or inguinal adenopathy  NEUROLOGICAL: Unremarkable. PSYCH:  Appropriate.   Appointment on 10/28/2016  Component Date Value Ref Range Status  . WBC 10/28/2016 5.6  3.8 - 10.6 K/uL Final  . RBC 10/28/2016 4.51  4.40 - 5.90 MIL/uL Final  .  Hemoglobin 10/28/2016 14.8  13.0 - 18.0 g/dL Final  . HCT 10/28/2016 42.5  40.0 - 52.0 % Final  . MCV 10/28/2016 94.2  80.0 - 100.0 fL Final  . MCH 10/28/2016 32.9  26.0 - 34.0 pg Final  . MCHC 10/28/2016 34.9  32.0 - 36.0 g/dL Final  . RDW 10/28/2016 12.9  11.5 - 14.5 % Final  . Platelets 10/28/2016 204  150 - 440 K/uL Final  . Neutrophils Relative % 10/28/2016 61  % Final  . Neutro Abs 10/28/2016 3.4  1.4 - 6.5 K/uL Final  . Lymphocytes Relative 10/28/2016 22  % Final  . Lymphs Abs 10/28/2016 1.2  1.0 - 3.6 K/uL Final  . Monocytes Relative 10/28/2016 12  % Final  . Monocytes Absolute 10/28/2016 0.7  0.2 - 1.0 K/uL Final  . Eosinophils Relative 10/28/2016 4  % Final  . Eosinophils Absolute 10/28/2016 0.2  0 - 0.7 K/uL Final  . Basophils Relative 10/28/2016 1  % Final  . Basophils Absolute 10/28/2016 0.1  0 - 0.1 K/uL Final  . Sodium 10/28/2016 135  135 - 145 mmol/L Final  . Potassium 10/28/2016 4.3  3.5 - 5.1 mmol/L Final  . Chloride 10/28/2016 101  101 - 111 mmol/L Final  . CO2 10/28/2016 27  22 - 32 mmol/L Final  . Glucose, Bld 10/28/2016 83  65 - 99 mg/dL Final  . BUN 10/28/2016 18  6 - 20 mg/dL Final  . Creatinine, Ser 10/28/2016 0.71  0.61 - 1.24 mg/dL Final  . Calcium 10/28/2016 9.7  8.9 -  10.3 mg/dL Final  . Total Protein 10/28/2016 6.8  6.5 - 8.1 g/dL Final  . Albumin 10/28/2016 3.7  3.5 - 5.0 g/dL Final  . AST 10/28/2016 31  15 - 41 U/L Final  . ALT 10/28/2016 21  17 - 63 U/L Final  . Alkaline Phosphatase 10/28/2016 58  38 - 126 U/L Final  . Total Bilirubin 10/28/2016 0.7  0.3 - 1.2 mg/dL Final  . GFR calc non Af Amer 10/28/2016 >60  >60 mL/min Final  . GFR calc Af Amer 10/28/2016 >60  >60 mL/min Final   Comment: (NOTE) The eGFR has been calculated using the CKD EPI equation. This calculation has not been validated in all clinical situations. eGFR's persistently <60 mL/min signify possible Chronic Kidney Disease.   Georgiann Hahn gap 10/28/2016 7  5 - 15 Final    Assessment:  Kenneth Luna. is a 83 y.o. male with stage IIC (T4bN0M0) low rectal adenocarcinoma s/p neoadjuvant chemotherapy and radiation followed by surgery.  He presented with rectal bleeding.  Colonoscopy on 07/10/2015 revealed a 4 cm frond-like/villous ulcerated non-obstructing mass in the rectum.  Biopsy revealed adenocarcinoma.  Chest, abdomen, and pelvic CT scan on 07/27/2015 revealed a circumferential soft tissue mass within the low rectum. He was a T12 compression fracture.  CEA was 4.1 on 07/26/2015.  Flexible sigmoidoscopy and ultrasound on 08/19/2015 revealed a 3.0 x 1.4 cm hypoechoic non-circumferential mass was found in the rectum.  There was no sonographic evidence suggesting breakthrough of the muscularis propria with invasion into the perirectal fat.  No lymph nodes were seen in the perirectal region and in the left iliac region. Stage was early T3uN0.  He received 5 weeks of concurrent Xeloda and radiation (09/09/2015 - 10/22/2015).  He underwent robotic assisted APR with end colostomy at [facility] on 12/17/2015 at [facility].  Pathology revealed ypT4bN0M0 (stage IIC) invasive moderately differentiated adenocarcinoma.  Levator muscle was involved.  He  received 6 cycles of adjuvant Xeloda (02/15/2016 -   06/06/2016).  Cycle #3 was truncated after 1 week secondary to palmar plantar erythrodysesthesia (hand foot syndrome).  Xeloda was dose reduced with cycle #4.  Chest, abdomen, and pelvic CT on 07/26/2016 revealed smooth uniform soft tissue thickening in the presacral space, compatible with posttreatment change.  There were no findings suspicious for local tumor recurrence in the pelvis.  There was no evidence of metastatic disease in the chest, abdomen or pelvis.  There was moderate superior L1 vertebral compression fracture of indeterminate chronicity, new since 08/08/2015.   CEA has been followed:  4.1 on 07/26/2015, 2.6 on 02/01/2016, and 2.9 on 07/25/2016.  He was admitted to [facility] from 07/24/2015 - 07/30/2015 with acute right basal ganglia hemorrhage and left humeral head fracture s/p a fall on 07/21/2015.  He has 24 hour home care.  He is fairly independent with a walker.  He was admitted to [facility] in [place] from 08/08/2015 - 08/09/2015 with acute diverticulitis.  He was discharged home on Augmentin rather than Flagyl given his alcohol dependency.    He has iron deficiency anemia.  Anemia workup on 03/07/2016 revealed a ferritin of 13, iron saturation 5% and TIBC 469 (high).  B12 was 501. Folate was > 47.  He takes oral B12 daily.  Diet is good.  Ferritin was 43 on 04/19/2016 and 99 on 07/25/2016.  He discontinued oral iron on 07/29/2016.  He drinks alcohol daily (3 glasses of wine).  He has mild dementia.   Symptomatically, he feels good.  Exam is normal.  Plan: 1.  Labs today:  CBC with diff, CMP, CEA, ferritin. 2.  RTC in 3 months for MD assess and labs (CBC with diff, CMP, CEA).   Lequita Asal, MD  10/28/2016, 10:50 AM

## 2016-10-29 LAB — CEA: CEA: 2.9 ng/mL (ref 0.0–4.7)

## 2017-01-27 ENCOUNTER — Encounter: Payer: Self-pay | Admitting: Hematology and Oncology

## 2017-01-27 ENCOUNTER — Inpatient Hospital Stay: Payer: Medicare Other | Attending: Hematology and Oncology | Admitting: Hematology and Oncology

## 2017-01-27 ENCOUNTER — Other Ambulatory Visit: Payer: Self-pay | Admitting: *Deleted

## 2017-01-27 ENCOUNTER — Inpatient Hospital Stay: Payer: Medicare Other

## 2017-01-27 VITALS — BP 128/75 | HR 54 | Temp 97.6°F | Resp 12 | Ht 65.0 in | Wt 144.0 lb

## 2017-01-27 DIAGNOSIS — K760 Fatty (change of) liver, not elsewhere classified: Secondary | ICD-10-CM

## 2017-01-27 DIAGNOSIS — E785 Hyperlipidemia, unspecified: Secondary | ICD-10-CM | POA: Diagnosis not present

## 2017-01-27 DIAGNOSIS — G8929 Other chronic pain: Secondary | ICD-10-CM

## 2017-01-27 DIAGNOSIS — K5790 Diverticulosis of intestine, part unspecified, without perforation or abscess without bleeding: Secondary | ICD-10-CM | POA: Insufficient documentation

## 2017-01-27 DIAGNOSIS — Z79899 Other long term (current) drug therapy: Secondary | ICD-10-CM | POA: Diagnosis not present

## 2017-01-27 DIAGNOSIS — Z87311 Personal history of (healed) other pathological fracture: Secondary | ICD-10-CM | POA: Insufficient documentation

## 2017-01-27 DIAGNOSIS — F0390 Unspecified dementia without behavioral disturbance: Secondary | ICD-10-CM

## 2017-01-27 DIAGNOSIS — Z86010 Personal history of colonic polyps: Secondary | ICD-10-CM | POA: Insufficient documentation

## 2017-01-27 DIAGNOSIS — Z87891 Personal history of nicotine dependence: Secondary | ICD-10-CM | POA: Diagnosis not present

## 2017-01-27 DIAGNOSIS — M549 Dorsalgia, unspecified: Secondary | ICD-10-CM | POA: Diagnosis not present

## 2017-01-27 DIAGNOSIS — D509 Iron deficiency anemia, unspecified: Secondary | ICD-10-CM

## 2017-01-27 DIAGNOSIS — K219 Gastro-esophageal reflux disease without esophagitis: Secondary | ICD-10-CM | POA: Diagnosis not present

## 2017-01-27 DIAGNOSIS — C20 Malignant neoplasm of rectum: Secondary | ICD-10-CM | POA: Diagnosis not present

## 2017-01-27 DIAGNOSIS — E538 Deficiency of other specified B group vitamins: Secondary | ICD-10-CM | POA: Diagnosis not present

## 2017-01-27 DIAGNOSIS — I10 Essential (primary) hypertension: Secondary | ICD-10-CM | POA: Diagnosis not present

## 2017-01-27 DIAGNOSIS — Z9221 Personal history of antineoplastic chemotherapy: Secondary | ICD-10-CM | POA: Insufficient documentation

## 2017-01-27 DIAGNOSIS — Z923 Personal history of irradiation: Secondary | ICD-10-CM

## 2017-01-27 DIAGNOSIS — S22080A Wedge compression fracture of T11-T12 vertebra, initial encounter for closed fracture: Secondary | ICD-10-CM

## 2017-01-27 LAB — COMPREHENSIVE METABOLIC PANEL
ALT: 26 U/L (ref 17–63)
AST: 35 U/L (ref 15–41)
Albumin: 3.9 g/dL (ref 3.5–5.0)
Alkaline Phosphatase: 62 U/L (ref 38–126)
Anion gap: 11 (ref 5–15)
BUN: 14 mg/dL (ref 6–20)
CO2: 25 mmol/L (ref 22–32)
Calcium: 9.6 mg/dL (ref 8.9–10.3)
Chloride: 99 mmol/L — ABNORMAL LOW (ref 101–111)
Creatinine, Ser: 0.67 mg/dL (ref 0.61–1.24)
GFR calc Af Amer: >60 mL/min (ref >60)
GFR calc non Af Amer: >60 mL/min (ref >60)
Glucose, Bld: 106 mg/dL — ABNORMAL HIGH (ref 65–99)
Potassium: 4.2 mmol/L (ref 3.5–5.1)
Sodium: 135 mmol/L (ref 135–145)
Total Bilirubin: 0.8 mg/dL (ref 0.3–1.2)
Total Protein: 7.2 g/dL (ref 6.5–8.1)

## 2017-01-27 LAB — CBC WITH DIFFERENTIAL/PLATELET
Basophils Absolute: 0.1 10*3/uL (ref 0–0.1)
Basophils Relative: 1 %
Eosinophils Absolute: 0.2 10*3/uL (ref 0–0.7)
Eosinophils Relative: 4 %
HCT: 45.1 % (ref 40.0–52.0)
Hemoglobin: 15.4 g/dL (ref 13.0–18.0)
Lymphocytes Relative: 19 %
Lymphs Abs: 1 10*3/uL (ref 1.0–3.6)
MCH: 32.8 pg (ref 26.0–34.0)
MCHC: 34.2 g/dL (ref 32.0–36.0)
MCV: 95.9 fL (ref 80.0–100.0)
Monocytes Absolute: 0.5 10*3/uL (ref 0.2–1.0)
Monocytes Relative: 9 %
Neutro Abs: 3.4 10*3/uL (ref 1.4–6.5)
Neutrophils Relative %: 67 %
Platelets: 224 10*3/uL (ref 150–440)
RBC: 4.7 MIL/uL (ref 4.40–5.90)
RDW: 13.3 % (ref 11.5–14.5)
WBC: 5.1 10*3/uL (ref 3.8–10.6)

## 2017-01-27 NOTE — Progress Notes (Signed)
Patient here for follow up no changes since his last appt.

## 2017-01-27 NOTE — Progress Notes (Signed)
[place] [facility] day: 01/27/2017   Chief Complaint: Kenneth Gilberg. is a 83 y.o. male with stage IIC rectal cancer who is seen for 3 month assessment.  HPI:  The patient was last seen in the medical oncology clinic on 10/28/2016.  At that time, he felt good.  Exam was normal.  CEA was 2.9  During the interim, patient doing well. He is pleasant and smiling today. Patient is alert and oriented. Patient denies any bleeding; no hematochezia, melena, or gross hematuria. Patient has no physical complaints. There have been no B symptoms. Patient denies interval infections. Patient eating well, with no appreciable weight loss demonstrated. Patient is active.  He "works out" on Monday, Wednesday, and Friday for 30-45 minutes. Patient continues to drink alcohol daily.   Patient sees Dr. Kathline Magic (surgeon) on Monday.   Past Medical History:  Diagnosis Date  . Allergic rhinitis   . Anemia   . B12 deficiency 06/29/2015  . Benign neoplasm of ascending colon   . Benign neoplasm of descending colon   . Colon polyp   . DD (diverticular disease) 06/29/2015  . Dementia    memory issues  . Fatty liver   . GERD (gastroesophageal reflux disease)   . Glaucoma   . Hyperlipidemia   . Hypertension   . Rectal adenocarcinoma ([place]) 07/15/2015  . Substance abuse ([place])    alcohol  . Traumatic intraparenchymal hemorrhage [facility])     Past Surgical History:  Procedure Laterality Date  . CATARACT EXTRACTION W/ INTRAOCULAR LENS IMPLANT    . COLONOSCOPY WITH PROPOFOL N/A 07/10/2015   Procedure: COLONOSCOPY WITH PROPOFOL;  Surgeon: Lucilla Lame, MD;  Location: [place];  Service: Endoscopy;  Laterality: N/A;  PER CAREGIVER WAS TOLD THAT PT WOULD BE 1ST (KEEP PT EARLY AM)  . POLYPECTOMY  07/10/2015   Procedure: POLYPECTOMY;  Surgeon: Lucilla Lame, MD;  Location: [place];  Service: Endoscopy;;    Family History  Problem Relation Age of Onset   . Cancer Father        Prostate    Social History:  reports that he quit smoking about 38 years ago. His smoking use included Pipe. He has never used smokeless tobacco. He reports that he drinks about 16.8 oz of alcohol per week . He reports that he does not use drugs.  He drinks 3 glasses of wine a day.  The patient is widowed.  He has no children.  The patient is accompanied by his caregiver, [name], today.  Allergies: No Known Allergies  Current Medications: Current Outpatient Prescriptions  Medication Sig Dispense Refill  . brimonidine-timolol (COMBIGAN) 0.2-0.5 % ophthalmic solution Place 1 drop into both eyes 2 (two) times daily.    . capecitabine (XELODA) 500 MG tablet Take 3 tablets (1,500 mg total) by mouth 2 (two) times daily after a meal. Take on days 1-14. Repeat every 21 days. 84 tablet 1  . cetirizine (ZYRTEC) 10 MG tablet Take 10 mg by mouth daily.     . cholecalciferol (VITAMIN D) 1000 units tablet Take 1,000 Units by mouth daily.    . clobetasol cream (TEMOVATE) 0.05 % Apply topically.    . docusate sodium (COLACE) 100 MG capsule Take 100 mg by mouth daily.    Marland Kitchen donepezil (ARICEPT) 10 MG tablet Take 10 mg by mouth at bedtime.    . fluticasone (FLONASE) 50 MCG/ACT nasal spray Place 2 sprays into both nostrils daily as needed for  rhinitis.     . folic acid (FOLVITE) 1 MG tablet Take 1 tablet (1 mg total) by mouth daily. 30 tablet 2  . gabapentin (NEURONTIN) 100 MG capsule     . hydrocortisone (ANUSOL-HC) 25 MG suppository Place 1 suppository (25 mg total) rectally 2 (two) times daily. 12 suppository 0  . Iron-Vitamin C (IRON 100/C PO) Take by mouth.    . lidocaine (XYLOCAINE) 5 % ointment   0  . metoprolol succinate (TOPROL XL) 25 MG 24 hr tablet Take 1 tablet (25 mg total) by mouth daily. 30 tablet 0  . montelukast (SINGULAIR) 10 MG tablet Take 10 mg by mouth at bedtime. Reported on 08/21/2015    . mupirocin ointment (BACTROBAN) 2 % Apply 1 application topically  daily as needed.   0  . omeprazole (PRILOSEC) 20 MG capsule Take 1 capsule (20 mg total) by mouth daily. 30 capsule 0  . ondansetron (ZOFRAN) 8 MG tablet Take 1 tablet (8 mg total) by mouth every 8 (eight) hours as needed for nausea or vomiting. 20 tablet 1  . thiamine 100 MG tablet Take 1 tablet (100 mg total) by mouth daily. 30 tablet 2  . valsartan (DIOVAN) 80 MG tablet Take 80 mg by mouth daily.    . vitamin B-12 (CYANOCOBALAMIN) 1000 MCG tablet Take 1,000 mcg by mouth daily.    . furosemide (LASIX) 20 MG tablet Take by mouth.     No current facility-administered medications for this visit.     Review of Systems:  GENERAL:  Feels good.  No fevers or sweats .  Weight stable. PERFORMANCE STATUS (ECOG):  2 HEENT: No visual changes, sore throat, mouth sores or tenderness. Lungs: No shortness of breath or cough.  No hemoptysis. Cardiac:  No chest pain, palpitations, orthopnea, or PND. GI:  Ostomy.  No nausea, vomiting, diarrhea, or constipation. GU:  No urgency, frequency, dysuria, or hematuria. Musculoskeletal:  Chronic back pain secondary to T12 compression fracture.  No pain or muscle tenderness. Extremities:  No lower extremity pain or swelling (takes Lasix prn). Skin:  No rashes, ulcers or skin changes.  Neuro:  Mild dementia.  Balance and coordination off (chronic).  No headache, numbness or weakness. Endocrine:  No diabetes, thyroid issues, hot flashes or night sweats. Psych:  No mood changes, depression or anxiety. Pain: No pain. Review of systems:  All other systems reviewed and found to be negative.  Physical Exam: Blood pressure 128/75, pulse (!) 54, temperature 97.6 F (36.4 C), temperature source Tympanic, resp. rate 12, height _0  (1.651 m), weight 144 lb (65.3 kg), SpO2 97 %. GENERAL:  Well developed, well nourished, elderly gentleman sitting comfortably in the exam room in no acute distress.  He has a 4 point cane at his side MENTAL STATUS:  Alert and oriented to  person, place and time. HEAD: Pearline Cables hair.  Normocephalic, atraumatic, face symmetric, no Cushingoid features. EYES:  Glasses.  Blue eyes.  Pupils equal round and reactive to light and accomodation.  No conjunctivitis or scleral icterus. ENT:  Oropharynx clear without lesion.  Tongue normal.  Mucous membranes moist.  RESPIRATORY:  Clear to auscultation without rales, wheezes or rhonchi. CARDIOVASCULAR:  Regular rate and rhythm without murmur, rub or gallop. ABDOMEN:  Ostomy.  Soft, non-tender, with active bowel sounds, and no hepatosplenomegaly.  No masses. SKIN:  No rashes, skin changes or ulcers.  No hand foot syndrome. EXTREMITIES:  No edema, no skin discoloration or tenderness.  No palpable cords. LYMPH NODES: No  palpable cervical, supraclavicular, axillary or inguinal adenopathy  NEUROLOGICAL: Unremarkable. PSYCH:  Appropriate.   Appointment on 01/27/2017  Component Date Value Ref Range Status  . WBC 01/27/2017 5.1  3.8 - 10.6 K/uL Final  . RBC 01/27/2017 4.70  4.40 - 5.90 MIL/uL Final  . Hemoglobin 01/27/2017 15.4  13.0 - 18.0 g/dL Final  . HCT 01/27/2017 45.1  40.0 - 52.0 % Final  . MCV 01/27/2017 95.9  80.0 - 100.0 fL Final  . MCH 01/27/2017 32.8  26.0 - 34.0 pg Final  . MCHC 01/27/2017 34.2  32.0 - 36.0 g/dL Final  . RDW 01/27/2017 13.3  11.5 - 14.5 % Final  . Platelets 01/27/2017 224  150 - 440 K/uL Final  . Neutrophils Relative % 01/27/2017 67  % Final  . Neutro Abs 01/27/2017 3.4  1.4 - 6.5 K/uL Final  . Lymphocytes Relative 01/27/2017 19  % Final  . Lymphs Abs 01/27/2017 1.0  1.0 - 3.6 K/uL Final  . Monocytes Relative 01/27/2017 9  % Final  . Monocytes Absolute 01/27/2017 0.5  0.2 - 1.0 K/uL Final  . Eosinophils Relative 01/27/2017 4  % Final  . Eosinophils Absolute 01/27/2017 0.2  0 - 0.7 K/uL Final  . Basophils Relative 01/27/2017 1  % Final  . Basophils Absolute 01/27/2017 0.1  0 - 0.1 K/uL Final  . Sodium 01/27/2017 135  135 - 145 mmol/L Final  . Potassium  01/27/2017 4.2  3.5 - 5.1 mmol/L Final  . Chloride 01/27/2017 99* 101 - 111 mmol/L Final  . CO2 01/27/2017 25  22 - 32 mmol/L Final  . Glucose, Bld 01/27/2017 106* 65 - 99 mg/dL Final  . BUN 01/27/2017 14  6 - 20 mg/dL Final  . Creatinine, Ser 01/27/2017 0.67  0.61 - 1.24 mg/dL Final  . Calcium 01/27/2017 9.6  8.9 - 10.3 mg/dL Final  . Total Protein 01/27/2017 7.2  6.5 - 8.1 g/dL Final  . Albumin 01/27/2017 3.9  3.5 - 5.0 g/dL Final  . AST 01/27/2017 35  15 - 41 U/L Final  . ALT 01/27/2017 26  17 - 63 U/L Final  . Alkaline Phosphatase 01/27/2017 62  38 - 126 U/L Final  . Total Bilirubin 01/27/2017 0.8  0.3 - 1.2 mg/dL Final  . GFR calc non Af Amer 01/27/2017 >60  >60 mL/min Final  . GFR calc Af Amer 01/27/2017 >60  >60 mL/min Final   Comment: (NOTE) The eGFR has been calculated using the CKD EPI equation. This calculation has not been validated in all clinical situations. eGFR's persistently <60 mL/min signify possible Chronic Kidney Disease.   . Anion gap 01/27/2017 11  5 - 15 Final    Assessment:  Kenneth Luna. is a 83 y.o. male with stage IIC (T4bN0M0) low rectal adenocarcinoma s/p neoadjuvant chemotherapy and radiation followed by surgery.  He presented with rectal bleeding.  Colonoscopy on 07/10/2015 revealed a 4 cm frond-like/villous ulcerated non-obstructing mass in the rectum.  Biopsy revealed adenocarcinoma.  Chest, abdomen, and pelvic CT scan on 07/27/2015 revealed a circumferential soft tissue mass within the low rectum. He was a T12 compression fracture.  CEA was 4.1 on 07/26/2015.  Flexible sigmoidoscopy and ultrasound on 08/19/2015 revealed a 3.0 x 1.4 cm hypoechoic non-circumferential mass was found in the rectum.  There was no sonographic evidence suggesting breakthrough of the muscularis propria with invasion into the perirectal fat.  No lymph nodes were seen in the perirectal region and in the left iliac region. Stage was  early T3uN0.  He received 5 weeks of  concurrent Xeloda and radiation (09/09/2015 - 10/22/2015).  He underwent robotic assisted APR with end colostomy at [facility] on 12/17/2015 at [facility].  Pathology revealed ypT4bN0M0 (stage IIC) invasive moderately differentiated adenocarcinoma.  Levator muscle was involved.  He received 6 cycles of adjuvant Xeloda (02/15/2016 -  06/06/2016).  Cycle #3 was truncated after 1 week secondary to palmar plantar erythrodysesthesia (hand foot syndrome).  Xeloda was dose reduced with cycle #4.  Chest, abdomen, and pelvic CT on 07/26/2016 revealed smooth uniform soft tissue thickening in the presacral space, compatible with posttreatment change.  There were no findings suspicious for local tumor recurrence in the pelvis.  There was no evidence of metastatic disease in the chest, abdomen or pelvis.  There was moderate superior L1 vertebral compression fracture of indeterminate chronicity, new since 08/08/2015.   CEA has been followed:  4.1 on 07/26/2015, 2.6 on 02/01/2016, 2.9 on 07/25/2016, and 2.9 on 10/28/2016.  He was admitted to [facility] from 07/24/2015 - 07/30/2015 with acute right basal ganglia hemorrhage and left humeral head fracture s/p a fall on 07/21/2015.  He has 24 hour home care.  He is fairly independent with a walker.  He was admitted to [facility] in [place] from 08/08/2015 - 08/09/2015 with acute diverticulitis.  He was discharged home on Augmentin rather than Flagyl given his alcohol dependency.    He has iron deficiency anemia.  Anemia workup on 03/07/2016 revealed a ferritin of 13, iron saturation 5% and TIBC 469 (high).  B12 was 501. Folate was > 47.  He takes oral B12 daily.  Diet is good.  Ferritin was 43 on 04/19/2016 and 99 on 07/25/2016.  He discontinued oral iron on 07/29/2016.  He drinks alcohol daily (3 glasses of wine).  He has mild dementia.   Symptomatically, he feels good. Patient has no physical complaints. He has no B symptoms. Exam is normal. Labs unremarkable.   Plan: 1.  Labs today:   CBC with diff, CMP, CEA, ferritin. 2.  Anticipate next scans on 07/26/2017. 3.  Follow-up with Dr. Sharol Roussel as scheduled. 4.  RTC in 3 months for MD assess and labs (CBC with diff, CMP, CEA).   Honor Loh, NP  01/27/2017, 10:32 AM   I saw and evaluated the patient, participating in the key portions of the service and reviewing pertinent diagnostic studies and records.  I reviewed the nurse practitioner's note and agree with the findings and the plan.  The assessment and plan were discussed with the patient.  A few questions were asked by the patient and answered.   Nolon Stalls, MD 01/27/2017,10:32 AM

## 2017-01-28 LAB — CEA: CEA: 3.1 ng/mL (ref 0.0–4.7)

## 2017-01-30 ENCOUNTER — Ambulatory Visit: Payer: Medicare Other | Admitting: Hematology and Oncology

## 2017-01-30 ENCOUNTER — Other Ambulatory Visit: Payer: Medicare Other

## 2017-01-30 DIAGNOSIS — J309 Allergic rhinitis, unspecified: Secondary | ICD-10-CM | POA: Insufficient documentation

## 2017-04-14 ENCOUNTER — Other Ambulatory Visit: Payer: Self-pay | Admitting: *Deleted

## 2017-04-14 ENCOUNTER — Telehealth: Payer: Self-pay | Admitting: *Deleted

## 2017-04-14 DIAGNOSIS — C20 Malignant neoplasm of rectum: Secondary | ICD-10-CM

## 2017-04-14 NOTE — Telephone Encounter (Signed)
Referral made to Dr. Allen Norris.

## 2017-04-14 NOTE — Telephone Encounter (Signed)
Glenda called and states that Dr Allen Norris did his last colonoscopy and they are requiring for [place] to send a referral for him to have another one done. Please send referral to Dr Allen Norris

## 2017-04-19 ENCOUNTER — Telehealth: Payer: Self-pay | Admitting: Gastroenterology

## 2017-04-19 ENCOUNTER — Other Ambulatory Visit: Payer: Self-pay

## 2017-04-19 DIAGNOSIS — C20 Malignant neoplasm of rectum: Secondary | ICD-10-CM

## 2017-04-19 NOTE — Telephone Encounter (Signed)
Colonoscopy has been scheduled with Dr. Allen Norris at [facility] on Monday 04/24/17

## 2017-04-19 NOTE — Telephone Encounter (Signed)
Patient left a voice message to schedule his colonoscopy. Please call

## 2017-04-20 ENCOUNTER — Other Ambulatory Visit: Payer: Self-pay

## 2017-04-20 ENCOUNTER — Encounter: Payer: Self-pay | Admitting: *Deleted

## 2017-04-21 NOTE — Discharge Instructions (Signed)
General Anesthesia, Adult, Care After °These instructions provide you with information about caring for yourself after your procedure. Your health care provider may also give you more specific instructions. Your treatment has been planned according to current medical practices, but problems sometimes occur. Call your health care provider if you have any problems or questions after your procedure. °What can I expect after the procedure? °After the procedure, it is common to have: °· Vomiting. °· A sore throat. °· Mental slowness. ° °It is common to feel: °· Nauseous. °· Cold or shivery. °· Sleepy. °· Tired. °· Sore or achy, even in parts of your body where you did not have surgery. ° °Follow these instructions at home: °For at least 24 hours after the procedure: °· Do not: °? Participate in activities where you could fall or become injured. °? Drive. °? Use heavy machinery. °? Drink alcohol. °? Take sleeping pills or medicines that cause drowsiness. °? Make important decisions or sign legal documents. °? Take care of children on your own. °· Rest. °Eating and drinking °· If you vomit, drink water, juice, or soup when you can drink without vomiting. °· Drink enough fluid to keep your urine clear or pale yellow. °· Make sure you have little or no nausea before eating solid foods. °· Follow the diet recommended by your health care provider. °General instructions °· Have a responsible adult stay with you until you are awake and alert. °· Return to your normal activities as told by your health care provider. Ask your health care provider what activities are safe for you. °· Take over-the-counter and prescription medicines only as told by your health care provider. °· If you smoke, do not smoke without supervision. °· Keep all follow-up visits as told by your health care provider. This is important. °Contact a health care provider if: °· You continue to have nausea or vomiting at home, and medicines are not helpful. °· You  cannot drink fluids or start eating again. °· You cannot urinate after 8-12 hours. °· You develop a skin rash. °· You have fever. °· You have increasing redness at the site of your procedure. °Get help right away if: °· You have difficulty breathing. °· You have chest pain. °· You have unexpected bleeding. °· You feel that you are having a life-threatening or urgent problem. °This information is not intended to replace advice given to you by your health care provider. Make sure you discuss any questions you have with your health care provider. °Document Released: 07/11/2000 Document Revised: 09/07/2015 Document Reviewed: 03/19/2015 °Elsevier Interactive Patient Education © 2018 [employer] ° °

## 2017-04-24 ENCOUNTER — Ambulatory Visit: Payer: Medicare Other | Admitting: Anesthesiology

## 2017-04-24 ENCOUNTER — Encounter
Admission: RE | Disposition: A | Discharge status: Home / Self Care | Payer: Self-pay | Source: Ambulatory Visit | Attending: Gastroenterology

## 2017-04-24 ENCOUNTER — Ambulatory Visit
Admission: RE | Admit: 2017-04-24 | Discharge: 2017-04-24 | Disposition: A | Discharge status: Home / Self Care | Payer: Medicare Other | Source: Ambulatory Visit | Attending: Gastroenterology | Admitting: Gastroenterology

## 2017-04-24 DIAGNOSIS — Z85038 Personal history of other malignant neoplasm of large intestine: Secondary | ICD-10-CM | POA: Diagnosis not present

## 2017-04-24 DIAGNOSIS — E538 Deficiency of other specified B group vitamins: Secondary | ICD-10-CM | POA: Diagnosis not present

## 2017-04-24 DIAGNOSIS — D649 Anemia, unspecified: Secondary | ICD-10-CM | POA: Insufficient documentation

## 2017-04-24 DIAGNOSIS — D122 Benign neoplasm of ascending colon: Secondary | ICD-10-CM

## 2017-04-24 DIAGNOSIS — K760 Fatty (change of) liver, not elsewhere classified: Secondary | ICD-10-CM | POA: Diagnosis not present

## 2017-04-24 DIAGNOSIS — Z9849 Cataract extraction status, unspecified eye: Secondary | ICD-10-CM | POA: Insufficient documentation

## 2017-04-24 DIAGNOSIS — E785 Hyperlipidemia, unspecified: Secondary | ICD-10-CM | POA: Diagnosis not present

## 2017-04-24 DIAGNOSIS — Z87891 Personal history of nicotine dependence: Secondary | ICD-10-CM | POA: Insufficient documentation

## 2017-04-24 DIAGNOSIS — Z79899 Other long term (current) drug therapy: Secondary | ICD-10-CM | POA: Insufficient documentation

## 2017-04-24 DIAGNOSIS — Z1211 Encounter for screening for malignant neoplasm of colon: Secondary | ICD-10-CM | POA: Diagnosis not present

## 2017-04-24 DIAGNOSIS — F0390 Unspecified dementia without behavioral disturbance: Secondary | ICD-10-CM | POA: Insufficient documentation

## 2017-04-24 DIAGNOSIS — K219 Gastro-esophageal reflux disease without esophagitis: Secondary | ICD-10-CM | POA: Insufficient documentation

## 2017-04-24 DIAGNOSIS — Z933 Colostomy status: Secondary | ICD-10-CM | POA: Insufficient documentation

## 2017-04-24 DIAGNOSIS — I10 Essential (primary) hypertension: Secondary | ICD-10-CM | POA: Insufficient documentation

## 2017-04-24 DIAGNOSIS — C20 Malignant neoplasm of rectum: Secondary | ICD-10-CM

## 2017-04-24 DIAGNOSIS — K5730 Diverticulosis of large intestine without perforation or abscess without bleeding: Secondary | ICD-10-CM | POA: Insufficient documentation

## 2017-04-24 DIAGNOSIS — Z8042 Family history of malignant neoplasm of prostate: Secondary | ICD-10-CM | POA: Diagnosis not present

## 2017-04-24 DIAGNOSIS — H409 Unspecified glaucoma: Secondary | ICD-10-CM | POA: Diagnosis not present

## 2017-04-24 HISTORY — PX: COLONOSCOPY WITH PROPOFOL: SHX5780

## 2017-04-24 HISTORY — DX: Colostomy status: Z93.3

## 2017-04-24 SURGERY — COLONOSCOPY WITH PROPOFOL
Anesthesia: General | Wound class: Contaminated

## 2017-04-24 MED ORDER — LACTATED RINGERS IV SOLN
INTRAVENOUS | Status: DC
  Administered 2017-04-24 (×2): via INTRAVENOUS

## 2017-04-24 MED ORDER — SODIUM CHLORIDE 0.9 % IV SOLN: INTRAVENOUS | Status: DC

## 2017-04-24 MED ORDER — PROPOFOL 10 MG/ML IV BOLUS
INTRAVENOUS | Status: DC | PRN
  Administered 2017-04-24: 20 mg via INTRAVENOUS
  Administered 2017-04-24: 50 mg via INTRAVENOUS
  Administered 2017-04-24 (×2): 20 mg via INTRAVENOUS
  Administered 2017-04-24: 30 mg via INTRAVENOUS
  Administered 2017-04-24: 100 mg via INTRAVENOUS
  Administered 2017-04-24: 20 mg via INTRAVENOUS

## 2017-04-24 MED ORDER — ACETAMINOPHEN 160 MG/5ML PO SOLN: 325.0000 mg | Freq: Once | ORAL | Status: DC

## 2017-04-24 MED ORDER — ACETAMINOPHEN 325 MG PO TABS: 325.0000 mg | ORAL_TABLET | Freq: Once | ORAL | Status: DC

## 2017-04-24 MED ORDER — STERILE WATER FOR IRRIGATION IR SOLN
Status: DC | PRN
  Administered 2017-04-24: 11:00:00

## 2017-04-24 MED ORDER — LIDOCAINE HCL (CARDIAC) 20 MG/ML IV SOLN
INTRAVENOUS | Status: DC | PRN
  Administered 2017-04-24: 30 mg via INTRAVENOUS

## 2017-04-24 SURGICAL SUPPLY — 23 items
CANISTER SUCT 1200ML W/VALVE (MISCELLANEOUS) ×3 IMPLANT
CLIP HMST 235XBRD CATH ROT (MISCELLANEOUS) IMPLANT
CLIP RESOLUTION 360 11X235 (MISCELLANEOUS)
FCP ESCP3.2XJMB 240X2.8X (MISCELLANEOUS)
FORCEPS BIOP RAD 4 LRG CAP 4 (CUTTING FORCEPS) ×3 IMPLANT
FORCEPS BIOP RJ4 240 W/NDL (MISCELLANEOUS)
FORCEPS ESCP3.2XJMB 240X2.8X (MISCELLANEOUS) IMPLANT
GOWN CVR UNV OPN BCK APRN NK (MISCELLANEOUS) ×2 IMPLANT
GOWN ISOL THUMB LOOP REG UNIV (MISCELLANEOUS) ×4
INJECTOR VARIJECT VIN23 (MISCELLANEOUS) IMPLANT
KIT DEFENDO VALVE AND CONN (KITS) IMPLANT
KIT ENDO PROCEDURE OLY (KITS) ×3 IMPLANT
MARKER SPOT ENDO TATTOO 5ML (MISCELLANEOUS) IMPLANT
PAD GROUND ADULT SPLIT (MISCELLANEOUS) IMPLANT
PROBE APC STR FIRE (PROBE) IMPLANT
RETRIEVER NET ROTH 2.5X230 LF (MISCELLANEOUS) IMPLANT
SNARE SHORT THROW 13M SML OVAL (MISCELLANEOUS) IMPLANT
SNARE SHORT THROW 30M LRG OVAL (MISCELLANEOUS) IMPLANT
SNARE SNG USE RND 15MM (INSTRUMENTS) IMPLANT
SPOT EX ENDOSCOPIC TATTOO (MISCELLANEOUS)
TRAP ETRAP POLY (MISCELLANEOUS) IMPLANT
VARIJECT INJECTOR VIN23 (MISCELLANEOUS)
WATER STERILE IRR 250ML POUR (IV SOLUTION) ×3 IMPLANT

## 2017-04-24 NOTE — Anesthesia Procedure Notes (Signed)
Date/Time: 04/24/2017 11:15 AM Performed by: Cameron Ali, CRNA Pre-anesthesia Checklist: Patient identified, Emergency Drugs available, Suction available, Timeout performed and Patient being monitored Patient Re-evaluated:Patient Re-evaluated prior to induction Oxygen Delivery Method: Nasal cannula Placement Confirmation: positive ETCO2

## 2017-04-24 NOTE — Anesthesia Postprocedure Evaluation (Signed)
Anesthesia Post Note  Patient: Kenneth Luna.  Procedure(s) Performed: COLONOSCOPY WITH PROPOFOL (N/A )  Patient location during evaluation: PACU Anesthesia Type: General Level of consciousness: awake and alert and oriented Pain management: satisfactory to patient Vital Signs Assessment: post-procedure vital signs reviewed and stable Respiratory status: spontaneous breathing, nonlabored ventilation and respiratory function stable Cardiovascular status: blood pressure returned to baseline and stable Postop Assessment: Adequate PO intake and No signs of nausea or vomiting Anesthetic complications: no    Raliegh Ip

## 2017-04-24 NOTE — H&P (Signed)
Lucilla Lame, MD [address] [place], [place] [address] [phone] Fax : [phone]  Primary Care Physician:  Dion Body, MD Primary Gastroenterologist:  Dr. Allen Norris  Pre-Procedure History & Physical: HPI:  Kenneth Luna. is a 84 y.o. male is here for an colonoscopy.   Past Medical History:  Diagnosis Date  . Allergic rhinitis   . Anemia   . B12 deficiency 06/29/2015  . Benign neoplasm of ascending colon   . Benign neoplasm of descending colon   . Colon polyp   . Colostomy in place [facility])   . DD (diverticular disease) 06/29/2015  . Dementia    memory issues  . Fatty liver   . GERD (gastroesophageal reflux disease)   . Glaucoma   . Hyperlipidemia   . Hypertension   . Rectal adenocarcinoma ([place]) 07/15/2015  . Substance abuse ([place])    alcohol  . Traumatic intraparenchymal hemorrhage [facility])     Past Surgical History:  Procedure Laterality Date  . ABDOMINOPERINEAL PROCTOCOLECTOMY  12/17/2015   [facility]  . CATARACT EXTRACTION W/ INTRAOCULAR LENS IMPLANT    . COLONOSCOPY WITH PROPOFOL N/A 07/10/2015   Procedure: COLONOSCOPY WITH PROPOFOL;  Surgeon: Lucilla Lame, MD;  Location: [place];  Service: Endoscopy;  Laterality: N/A;  PER CAREGIVER WAS TOLD THAT PT WOULD BE 1ST (KEEP PT EARLY AM)  . POLYPECTOMY  07/10/2015   Procedure: POLYPECTOMY;  Surgeon: Lucilla Lame, MD;  Location: [place];  Service: Endoscopy;;    Prior to Admission medications   Medication Sig Start Date End Date Taking? Authorizing Provider  brimonidine-timolol (COMBIGAN) 0.2-0.5 % ophthalmic solution Place 1 drop into both eyes 2 (two) times daily.   Yes [provider]  cetirizine (ZYRTEC) 10 MG tablet Take 10 mg by mouth daily.    Yes [provider]  cholecalciferol (VITAMIN D) 1000 units tablet Take 1,000 Units by mouth daily.   Yes [provider]  clobetasol cream (TEMOVATE) 0.05 % Apply topically. 08/31/15  Yes [provider]  docusate sodium (COLACE) 100 MG capsule Take 100 mg by mouth daily.   Yes [provider]  donepezil (ARICEPT) 10 MG tablet Take 10 mg by mouth at bedtime.   Yes [provider]  fluticasone (FLONASE) 50 MCG/ACT nasal spray Place 2 sprays into both nostrils daily as needed for rhinitis.    Yes [provider]  gabapentin (NEURONTIN) 100 MG capsule  08/18/15  Yes [provider]  lidocaine (XYLOCAINE) 5 % ointment  02/02/16  Yes [provider]  montelukast (SINGULAIR) 10 MG tablet Take 10 mg by mouth at bedtime. Reported on 08/21/2015   Yes [provider]  Multiple Vitamin (MULTIVITAMIN) tablet Take 1 tablet by mouth daily.   Yes [provider]  omeprazole (PRILOSEC) 20 MG capsule Take 1 capsule (20 mg total) by mouth daily. 06/24/15  Yes Wieting, Richard, MD  vitamin B-12 (CYANOCOBALAMIN) 1000 MCG tablet Take 1,000 mcg by mouth daily.   Yes [provider]  capecitabine (XELODA) 500 MG tablet Take 3 tablets (1,500 mg total) by mouth 2 (two) times daily after a meal. Take on days 1-14. Repeat every 21 days. Patient not taking: Reported on 04/20/2017 02/10/16   Lequita Asal, MD  folic acid (FOLVITE) 1 MG tablet Take 1 tablet (1 mg total) by mouth daily. 08/09/15   Rama, Venetia Maxon, MD  furosemide (LASIX) 20 MG tablet Take by mouth. 11/03/15 11/02/16  [provider]  hydrocortisone (ANUSOL-HC)  25 MG suppository Place 1 suppository (25 mg total) rectally 2 (two) times daily. 06/24/15   Loletha Grayer, MD  Iron-Vitamin C (IRON 100/C PO) Take by mouth.    [provider]  metoprolol succinate (TOPROL XL) 25 MG 24 hr tablet Take 1 tablet (25 mg total) by mouth daily. 06/24/15   Loletha Grayer, MD  mupirocin ointment (BACTROBAN) 2 % Apply 1 application topically daily as needed.  07/01/15   [provider]  ondansetron (ZOFRAN) 8 MG tablet Take 1 tablet (8 mg total) by mouth every 8 (eight)  hours as needed for nausea or vomiting. 09/07/15   Lequita Asal, MD  thiamine 100 MG tablet Take 1 tablet (100 mg total) by mouth daily. 08/09/15   Rama, Venetia Maxon, MD  valsartan (DIOVAN) 80 MG tablet Take 80 mg by mouth daily.    [provider]    Allergies as of 04/19/2017  . (No Known Allergies)    Family History  Problem Relation Age of Onset  . Cancer Father        Prostate    Social History   Socioeconomic History  . Marital status: Married    Spouse name: Not on file  . Number of children: Not on file  . Years of education: Not on file  . Highest education level: Not on file  Social Needs  . Financial resource strain: Not on file  . Food insecurity - worry: Not on file  . Food insecurity - inability: Not on file  . Transportation needs - medical: Not on file  . Transportation needs - non-medical: Not on file  Occupational History  . Not on file  Tobacco Use  . Smoking status: Former Smoker    Types: Pipe    Last attempt to quit: 04/18/1978    Years since quitting: 39.0  . Smokeless tobacco: Never Used  Substance and Sexual Activity  . Alcohol use: Yes    Alcohol/week: 16.8 oz    Types: 28 Glasses of wine per week    Comment: 4 glass of wine daily  . Drug use: No  . Sexual activity: Not on file  Other Topics Concern  . Not on file  Social History Narrative  . Not on file    Review of Systems: See HPI, otherwise negative ROS  Physical Exam: BP (!) 164/57   Pulse (!) 50   Temp (!) 97.5 F (36.4 C)   Ht 5\' 5"  (1.651 m)   Wt 141 lb (64 kg)   SpO2 100%   BMI 23.46 kg/m  General:   Alert,  pleasant and cooperative in NAD Head:  Normocephalic and atraumatic. Neck:  Supple; no masses or thyromegaly. Lungs:  Clear throughout to auscultation.    Heart:  Regular rate and rhythm. Abdomen:  Soft, nontender and nondistended. Normal bowel sounds, without guarding, and without rebound.  Positive colostomy Neurologic:  Alert and  oriented x4;   grossly normal neurologically.  Impression/Plan: Kenneth Luna. is here for an colonoscopy to be performed for history of rctal vcancer  Risks, benefits, limitations, and alternatives regarding  colonoscopy have been reviewed with the patient.  Questions have been answered.  All parties agreeable.   Lucilla Lame, MD  04/24/2017, 11:05 AM

## 2017-04-24 NOTE — Op Note (Signed)
[facility]astroenterology Patient Name: Kenneth Luna Procedure Date: 04/24/2017 11:10 AM MRN: [id number] Account #: [id number] Date of Birth: [birth year] Admit Type: Outpatient Age: 84 Room: [facility] OR ROOM 01 Gender: Male Note Status: Finalized Procedure:            Colonoscopy Indications:          High risk colon cancer surveillance: Personal history                        of colon cancer Providers:            Lucilla Lame MD, MD Referring MD:         Dion Body (Referring MD) Medicines:            Propofol per Anesthesia Complications:        No immediate complications. Procedure:            Pre-Anesthesia Assessment:                       - Prior to the procedure, a History and Physical was                        performed, and patient medications and allergies were                        reviewed. The patient's tolerance of previous                        anesthesia was also reviewed. The risks and benefits of                        the procedure and the sedation options and risks were                        discussed with the patient. All questions were                        answered, and informed consent was obtained. Prior                        Anticoagulants: The patient has taken no previous                        anticoagulant or antiplatelet agents. ASA Grade                        Assessment: II - A patient with mild systemic disease.                        After reviewing the risks and benefits, the patient was                        deemed in satisfactory condition to undergo the                        procedure.                       After obtaining informed consent, the colonoscope was  passed under direct vision. Throughout the procedure,                        the patient's blood pressure, pulse, and oxygen                        saturations were monitored continuously. The Olympus CF   H180AL Colonoscope (S#: [id number]) was introduced through                        the sigmoid colostomy and advanced to the the cecum,                        identified by appendiceal orifice and ileocecal valve.                        The colonoscopy was performed without difficulty. The                        patient tolerated the procedure well. The quality of                        the bowel preparation was excellent. Findings:      The perianal and digital rectal examinations were normal.      A 3 mm polyp was found in the ascending colon. The polyp was sessile.       The polyp was removed with a cold biopsy forceps. Resection and       retrieval were complete.      Multiple small-mouthed diverticula were found in the entire colon. Impression:           - One 3 mm polyp in the ascending colon, removed with a                        cold biopsy forceps. Resected and retrieved.                       - Diverticulosis in the entire examined colon. Recommendation:       - Discharge patient to home.                       - Resume previous diet.                       - Continue present medications.                       - Await pathology results. Procedure Code(s):    --- Professional ---                       [phone], Colonoscopy through stoma; with biopsy, single                        or multiple Diagnosis Code(s):    --- Professional ---                       S56.812, Personal history of other malignant neoplasm                        of large intestine  D12.2, Benign neoplasm of ascending colon CPT copyright 2016 American Medical Association. All rights reserved. The codes documented in this report are preliminary and upon coder review may  be revised to meet current compliance requirements. Lucilla Lame MD, MD 04/24/2017 11:36:19 AM This report has been signed electronically. Number of Addenda: 0 Note Initiated On: 04/24/2017 11:10 AM Scope Withdrawal Time: 0 hours 4  minutes 17 seconds  Total Procedure Duration: 0 hours 8 minutes 55 seconds       [facility]

## 2017-04-24 NOTE — Anesthesia Preprocedure Evaluation (Signed)
Anesthesia Evaluation  Patient identified by MRN, date of birth, ID band Patient awake    Reviewed: Allergy & Precautions, H&P , NPO status , Patient's Chart, lab work & pertinent test results  Airway Mallampati: II  TM Distance: >3 FB Neck ROM: full    Dental no notable dental hx.    Pulmonary former smoker,    Pulmonary exam normal breath sounds clear to auscultation       Cardiovascular hypertension, Normal cardiovascular exam Rhythm:regular Rate:Normal     Neuro/Psych PSYCHIATRIC DISORDERS    GI/Hepatic GERD  ,  Endo/Other    Renal/GU      Musculoskeletal   Abdominal   Peds  Hematology   Anesthesia Other Findings   Reproductive/Obstetrics                             Anesthesia Physical Anesthesia Plan  ASA: II  Anesthesia Plan: General   Post-op Pain Management:    Induction: Intravenous  PONV Risk Score and Plan: 2 and Treatment may vary due to age or medical condition and Propofol infusion  Airway Management Planned: Natural Airway  Additional Equipment:   Intra-op Plan:   Post-operative Plan:   Informed Consent: I have reviewed the patients History and Physical, chart, labs and discussed the procedure including the risks, benefits and alternatives for the proposed anesthesia with the patient or authorized representative who has indicated his/her understanding and acceptance.     Plan Discussed with: CRNA  Anesthesia Plan Comments:         Anesthesia Quick Evaluation

## 2017-04-24 NOTE — Transfer of Care (Signed)
Immediate Anesthesia Transfer of Care Note  Patient: Kenneth Luna.  Procedure(s) Performed: COLONOSCOPY WITH PROPOFOL (N/A )  Patient Location: PACU  Anesthesia Type: General  Level of Consciousness: awake, alert  and patient cooperative  Airway and Oxygen Therapy: Patient Spontanous Breathing and Patient connected to supplemental oxygen  Post-op Assessment: Post-op Vital signs reviewed, Patient's Cardiovascular Status Stable, Respiratory Function Stable, Patent Airway and No signs of Nausea or vomiting  Post-op Vital Signs: Reviewed and stable  Complications: No apparent anesthesia complications

## 2017-04-25 ENCOUNTER — Encounter: Payer: Self-pay | Admitting: Gastroenterology

## 2017-04-27 ENCOUNTER — Encounter: Payer: Self-pay | Admitting: Gastroenterology

## 2017-04-28 ENCOUNTER — Inpatient Hospital Stay: Payer: Medicare Other | Attending: Hematology and Oncology

## 2017-04-28 ENCOUNTER — Other Ambulatory Visit: Payer: Self-pay | Admitting: Hematology and Oncology

## 2017-04-28 ENCOUNTER — Inpatient Hospital Stay (HOSPITAL_BASED_OUTPATIENT_CLINIC_OR_DEPARTMENT_OTHER): Payer: Medicare Other | Admitting: Hematology and Oncology

## 2017-04-28 VITALS — BP 122/78 | HR 52 | Temp 96.7°F | Resp 20 | Wt 145.2 lb

## 2017-04-28 DIAGNOSIS — Z85048 Personal history of other malignant neoplasm of rectum, rectosigmoid junction, and anus: Secondary | ICD-10-CM

## 2017-04-28 DIAGNOSIS — Z86010 Personal history of colonic polyps: Secondary | ICD-10-CM | POA: Insufficient documentation

## 2017-04-28 DIAGNOSIS — Z9221 Personal history of antineoplastic chemotherapy: Secondary | ICD-10-CM

## 2017-04-28 DIAGNOSIS — C20 Malignant neoplasm of rectum: Secondary | ICD-10-CM

## 2017-04-28 DIAGNOSIS — Z862 Personal history of diseases of the blood and blood-forming organs and certain disorders involving the immune mechanism: Secondary | ICD-10-CM | POA: Diagnosis not present

## 2017-04-28 DIAGNOSIS — D509 Iron deficiency anemia, unspecified: Secondary | ICD-10-CM

## 2017-04-28 DIAGNOSIS — Z923 Personal history of irradiation: Secondary | ICD-10-CM

## 2017-04-28 LAB — COMPREHENSIVE METABOLIC PANEL
ALT: 23 U/L (ref 17–63)
AST: 31 U/L (ref 15–41)
Albumin: 4 g/dL (ref 3.5–5.0)
Alkaline Phosphatase: 56 U/L (ref 38–126)
Anion gap: 7 (ref 5–15)
BUN: 18 mg/dL (ref 6–20)
CO2: 29 mmol/L (ref 22–32)
Calcium: 9.9 mg/dL (ref 8.9–10.3)
Chloride: 100 mmol/L — ABNORMAL LOW (ref 101–111)
Creatinine, Ser: 0.69 mg/dL (ref 0.61–1.24)
GFR calc Af Amer: >60 mL/min (ref >60)
GFR calc non Af Amer: >60 mL/min (ref >60)
Glucose, Bld: 116 mg/dL — ABNORMAL HIGH (ref 65–99)
Potassium: 4.8 mmol/L (ref 3.5–5.1)
Sodium: 136 mmol/L (ref 135–145)
Total Bilirubin: 0.6 mg/dL (ref 0.3–1.2)
Total Protein: 7.4 g/dL (ref 6.5–8.1)

## 2017-04-28 LAB — CBC WITH DIFFERENTIAL/PLATELET
Basophils Absolute: 0 10*3/uL (ref 0–0.1)
Basophils Relative: 0 %
Eosinophils Absolute: 0.2 10*3/uL (ref 0–0.7)
Eosinophils Relative: 5 %
HCT: 43.9 % (ref 40.0–52.0)
Hemoglobin: 15 g/dL (ref 13.0–18.0)
Lymphocytes Relative: 26 %
Lymphs Abs: 1.2 10*3/uL (ref 1.0–3.6)
MCH: 32.5 pg (ref 26.0–34.0)
MCHC: 34.1 g/dL (ref 32.0–36.0)
MCV: 95.3 fL (ref 80.0–100.0)
Monocytes Absolute: 0.6 10*3/uL (ref 0.2–1.0)
Monocytes Relative: 12 %
Neutro Abs: 2.7 10*3/uL (ref 1.4–6.5)
Neutrophils Relative %: 57 %
Platelets: 210 10*3/uL (ref 150–440)
RBC: 4.61 MIL/uL (ref 4.40–5.90)
RDW: 13.5 % (ref 11.5–14.5)
WBC: 4.8 10*3/uL (ref 3.8–10.6)

## 2017-04-28 LAB — FERRITIN: Ferritin: 62 ng/mL (ref 24–336)

## 2017-04-28 NOTE — Progress Notes (Signed)
[facility] day: 04/28/2017   Chief Complaint: Kenneth Luna. is a 84 y.o. male with stage IIC rectal cancer who is seen for 3 month assessment.  HPI:  The patient was last seen in the medical oncology clinic on 01/27/2017.  At that time, he felt good.  Exam was normal.  CEA was 3.1.  He saw Dr. Kathline Magic at [facility] on 01/30/2017.  His parastomal hernia remained asymptomatic and did not cause him discomfort or interfere with his ostomy bag placement. Hernia size remained stable. He was not interested in pursuing surgical intervention at this time.  Plans were discussed for follow-up colonoscopy, CEA, and scans.  He underwent colonoscopy on 04/24/2017 by Dr. Allen Norris.   He had a 3 mm polyp in the ascending colon.  Pathology revealed a tubular adenoma.  He had diverticulosis throughout the colon.  During the interim, patient has been doing "pretty good". He denies any physical complaints today. Patient denies bleeding; no hematochezia, melena, or gross hematuria. Patient denies B symptoms or interval infections. Patient eating well. His weight has increased 1 pound since his last visit.   Patient continues on Xeloda with no perceived side effects.    Past Medical History:  Diagnosis Date  . Allergic rhinitis   . Anemia   . B12 deficiency 06/29/2015  . Benign neoplasm of ascending colon   . Benign neoplasm of descending colon   . Colon polyp   . Colostomy in place [facility])   . DD (diverticular disease) 06/29/2015  . Dementia    memory issues  . Fatty liver   . GERD (gastroesophageal reflux disease)   . Glaucoma   . Hyperlipidemia   . Hypertension   . Rectal adenocarcinoma ([place]) 07/15/2015  . Substance abuse ([place])    alcohol  . Traumatic intraparenchymal hemorrhage [facility])     Past Surgical History:  Procedure Laterality Date  . ABDOMINOPERINEAL PROCTOCOLECTOMY  12/17/2015   [facility]  . CATARACT EXTRACTION W/ INTRAOCULAR LENS IMPLANT    .  COLONOSCOPY WITH PROPOFOL N/A 07/10/2015   Procedure: COLONOSCOPY WITH PROPOFOL;  Surgeon: Lucilla Lame, MD;  Location: [place];  Service: Endoscopy;  Laterality: N/A;  PER CAREGIVER WAS TOLD THAT PT WOULD BE 1ST (KEEP PT EARLY AM)  . COLONOSCOPY WITH PROPOFOL N/A 04/24/2017   Procedure: COLONOSCOPY WITH PROPOFOL;  Surgeon: Lucilla Lame, MD;  Location: [place];  Service: Endoscopy;  Laterality: N/A;  . POLYPECTOMY  07/10/2015   Procedure: POLYPECTOMY;  Surgeon: Lucilla Lame, MD;  Location: [place];  Service: Endoscopy;;    Family History  Problem Relation Age of Onset  . Cancer Father        Prostate    Social History:  reports that he quit smoking about 39 years ago. His smoking use included pipe. he has never used smokeless tobacco. He reports that he drinks about 16.8 oz of alcohol per week. He reports that he does not use drugs.  He drinks 3 glasses of wine a day.  The patient is widowed.  He has no children.  The patient is accompanied by his caregiver, [name], today.  Allergies: No Known Allergies  Current Medications: Current Outpatient Medications  Medication Sig Dispense Refill  . brimonidine-timolol (COMBIGAN) 0.2-0.5 % ophthalmic solution Place 1 drop into both eyes 2 (two) times daily.    . capecitabine (XELODA) 500 MG tablet Take 3 tablets (1,500 mg total) by mouth 2 (two) times daily after  a meal. Take on days 1-14. Repeat every 21 days. 84 tablet 1  . cetirizine (ZYRTEC) 10 MG tablet Take 10 mg by mouth daily.     . cholecalciferol (VITAMIN D) 1000 units tablet Take 1,000 Units by mouth daily.    . clobetasol cream (TEMOVATE) 0.05 % Apply topically.    . docusate sodium (COLACE) 100 MG capsule Take 100 mg by mouth daily.    Marland Kitchen donepezil (ARICEPT) 10 MG tablet Take 10 mg by mouth at bedtime.    . fluticasone (FLONASE) 50 MCG/ACT nasal spray Place 2 sprays into both nostrils daily as needed for rhinitis.     . folic acid (FOLVITE) 1 MG  tablet Take 1 tablet (1 mg total) by mouth daily. 30 tablet 2  . gabapentin (NEURONTIN) 100 MG capsule     . hydrocortisone (ANUSOL-HC) 25 MG suppository Place 1 suppository (25 mg total) rectally 2 (two) times daily. 12 suppository 0  . Iron-Vitamin C (IRON 100/C PO) Take by mouth.    . lidocaine (XYLOCAINE) 5 % ointment   0  . metoprolol succinate (TOPROL XL) 25 MG 24 hr tablet Take 1 tablet (25 mg total) by mouth daily. 30 tablet 0  . montelukast (SINGULAIR) 10 MG tablet Take 10 mg by mouth at bedtime. Reported on 08/21/2015    . Multiple Vitamin (MULTIVITAMIN) tablet Take 1 tablet by mouth daily.    . mupirocin ointment (BACTROBAN) 2 % Apply 1 application topically daily as needed.   0  . omeprazole (PRILOSEC) 20 MG capsule Take 1 capsule (20 mg total) by mouth daily. 30 capsule 0  . ondansetron (ZOFRAN) 8 MG tablet Take 1 tablet (8 mg total) by mouth every 8 (eight) hours as needed for nausea or vomiting. 20 tablet 1  . thiamine 100 MG tablet Take 1 tablet (100 mg total) by mouth daily. 30 tablet 2  . valsartan (DIOVAN) 80 MG tablet Take 80 mg by mouth daily.    . vitamin B-12 (CYANOCOBALAMIN) 1000 MCG tablet Take 1,000 mcg by mouth daily.    . furosemide (LASIX) 20 MG tablet Take by mouth.     No current facility-administered medications for this visit.     Review of Systems:  GENERAL:  Feels "pretty good".  No fevers or sweats .  Weight up 1 pound. PERFORMANCE STATUS (ECOG):  2 HEENT: No visual changes, sore throat, mouth sores or tenderness. Lungs: No shortness of breath or cough.  No hemoptysis. Cardiac:  No chest pain, palpitations, orthopnea, or PND. GI:  Ostomy.  No nausea, vomiting, diarrhea, or constipation. GU:  No urgency, frequency, dysuria, or hematuria. Musculoskeletal:  Chronic back pain secondary to T12 compression fracture.  No pain or muscle tenderness. Extremities:  No lower extremity pain or swelling (takes Lasix prn). Skin:  No rashes, ulcers or skin changes.   Neuro:  Mild dementia.  Balance and coordination off (chronic).  No headache, numbness or weakness. Endocrine:  No diabetes, thyroid issues, hot flashes or night sweats. Psych:  No mood changes, depression or anxiety. Pain: No pain. Review of systems:  All other systems reviewed and found to be negative.  Physical Exam: Blood pressure 122/78, pulse (!) 52, temperature (!) 96.7 F (35.9 C), temperature source Tympanic, resp. rate 20, weight 145 lb 3 oz (65.9 kg). GENERAL:  Well developed, well nourished, elderly gentleman sitting comfortably in the exam room in no acute distress.  He has a 4 point cane at his side MENTAL STATUS:  Alert and oriented  to person, place and time. HEAD: Pearline Cables hair.  Normocephalic, atraumatic, face symmetric, no Cushingoid features. EYES:  Glasses.  Blue eyes.  Pupils equal round and reactive to light and accomodation.  No conjunctivitis or scleral icterus. ENT:  Oropharynx clear without lesion.  Tongue normal.  Mucous membranes moist.  RESPIRATORY:  Clear to auscultation without rales, wheezes or rhonchi. CARDIOVASCULAR:  Regular rate and rhythm without murmur, rub or gallop. ABDOMEN:  Ostomy.  Soft, non-tender, with active bowel sounds, and no hepatosplenomegaly.  No masses. SKIN:  No rashes, skin changes or ulcers.  No hand foot syndrome. EXTREMITIES:  No edema, no skin discoloration or tenderness.  No palpable cords. LYMPH NODES: No palpable cervical, supraclavicular, axillary or inguinal adenopathy  NEUROLOGICAL: Unremarkable. PSYCH:  Appropriate.   Appointment on 04/28/2017  Component Date Value Ref Range Status  . Sodium 04/28/2017 136  135 - 145 mmol/L Final  . Potassium 04/28/2017 4.8  3.5 - 5.1 mmol/L Final  . Chloride 04/28/2017 100* 101 - 111 mmol/L Final  . CO2 04/28/2017 29  22 - 32 mmol/L Final  . Glucose, Bld 04/28/2017 116* 65 - 99 mg/dL Final  . BUN 04/28/2017 18  6 - 20 mg/dL Final  . Creatinine, Ser 04/28/2017 0.69  0.61 - 1.24 mg/dL  Final  . Calcium 04/28/2017 9.9  8.9 - 10.3 mg/dL Final  . Total Protein 04/28/2017 7.4  6.5 - 8.1 g/dL Final  . Albumin 04/28/2017 4.0  3.5 - 5.0 g/dL Final  . AST 04/28/2017 31  15 - 41 U/L Final  . ALT 04/28/2017 23  17 - 63 U/L Final  . Alkaline Phosphatase 04/28/2017 56  38 - 126 U/L Final  . Total Bilirubin 04/28/2017 0.6  0.3 - 1.2 mg/dL Final  . GFR calc non Af Amer 04/28/2017 >60  >60 mL/min Final  . GFR calc Af Amer 04/28/2017 >60  >60 mL/min Final   Comment: (NOTE) The eGFR has been calculated using the CKD EPI equation. This calculation has not been validated in all clinical situations. eGFR's persistently <60 mL/min signify possible Chronic Kidney Disease.   Georgiann Hahn gap 04/28/2017 7  5 - 15 Final   Performed at [facility], [address] [place], [place] [address]  . WBC 04/28/2017 4.8  3.8 - 10.6 K/uL Final  . RBC 04/28/2017 4.61  4.40 - 5.90 MIL/uL Final  . Hemoglobin 04/28/2017 15.0  13.0 - 18.0 g/dL Final  . HCT 04/28/2017 43.9  40.0 - 52.0 % Final  . MCV 04/28/2017 95.3  80.0 - 100.0 fL Final  . MCH 04/28/2017 32.5  26.0 - 34.0 pg Final  . MCHC 04/28/2017 34.1  32.0 - 36.0 g/dL Final  . RDW 04/28/2017 13.5  11.5 - 14.5 % Final  . Platelets 04/28/2017 210  150 - 440 K/uL Final  . Neutrophils Relative % 04/28/2017 57  % Final  . Neutro Abs 04/28/2017 2.7  1.4 - 6.5 K/uL Final  . Lymphocytes Relative 04/28/2017 26  % Final  . Lymphs Abs 04/28/2017 1.2  1.0 - 3.6 K/uL Final  . Monocytes Relative 04/28/2017 12  % Final  . Monocytes Absolute 04/28/2017 0.6  0.2 - 1.0 K/uL Final  . Eosinophils Relative 04/28/2017 5  % Final  . Eosinophils Absolute 04/28/2017 0.2  0 - 0.7 K/uL Final  . Basophils Relative 04/28/2017 0  % Final  . Basophils Absolute 04/28/2017 0.0  0 - 0.1 K/uL Final   Performed at [facility], [address] [place], [place] [address]    Assessment:  Tahji Manchester. is a 84 y.o. male with stage IIC (T4bN0M0) low rectal  adenocarcinoma s/p neoadjuvant chemotherapy and radiation followed by surgery.  He presented with rectal bleeding.  Colonoscopy on 07/10/2015 revealed a 4 cm frond-like/villous ulcerated non-obstructing mass in the rectum.  Biopsy revealed adenocarcinoma.  Chest, abdomen, and pelvic CT scan on 07/27/2015 revealed a circumferential soft tissue mass within the low rectum. He was a T12 compression fracture.  CEA was 4.1 on 07/26/2015.  Flexible sigmoidoscopy and ultrasound on 08/19/2015 revealed a 3.0 x 1.4 cm hypoechoic non-circumferential mass was found in the rectum.  There was no sonographic evidence suggesting breakthrough of the muscularis propria with invasion into the perirectal fat.  No lymph nodes were seen in the perirectal region and in the left iliac region. Stage was early T3uN0.  He received 5 weeks of concurrent Xeloda and radiation (09/09/2015 - 10/22/2015).  He underwent robotic assisted APR with end colostomy at [facility] on 12/17/2015 at [facility].  Pathology revealed ypT4bN0M0 (stage IIC) invasive moderately differentiated adenocarcinoma.  Levator muscle was involved.  He received 6 cycles of adjuvant Xeloda (02/15/2016 -  06/06/2016).  Cycle #3 was truncated after 1 week secondary to palmar plantar erythrodysesthesia (hand foot syndrome).  Xeloda was dose reduced with cycle #4.  Chest, abdomen, and pelvic CT on 07/26/2016 revealed smooth uniform soft tissue thickening in the presacral space, compatible with posttreatment change.  There were no findings suspicious for local tumor recurrence in the pelvis.  There was no evidence of metastatic disease in the chest, abdomen or pelvis.  There was moderate superior L1 vertebral compression fracture of indeterminate chronicity, new since 08/08/2015.   CEA has been followed:  4.1 on 07/26/2015, 2.6 on 02/01/2016, 2.9 on 07/25/2016, 2.9 on 10/28/2016, 3.1 on 01/27/2017, and 2.9 on 04/28/2017.  Colonoscopy on 04/24/2017 revealed a 3 mm polpy in the  ascending colon (tubular adenoma).  He had diverticulosis throughout the colon.  He was admitted to [facility] from 07/24/2015 - 07/30/2015 with acute right basal ganglia hemorrhage and left humeral head fracture s/p a fall on 07/21/2015.  He has 24 hour home care.  He is fairly independent with a walker.  He was admitted to [facility] in [place] from 08/08/2015 - 08/09/2015 with acute diverticulitis.  He was discharged home on Augmentin rather than Flagyl given his alcohol dependency.    He has iron deficiency anemia.  Anemia workup on 03/07/2016 revealed a ferritin of 13, iron saturation 5% and TIBC 469 (high).  B12 was 501. Folate was > 47.  He takes oral B12 daily.  Diet is good.  Ferritin was 43 on 04/19/2016, 99 on 07/25/2016, and 50 on 10/28/2016.  He discontinued oral iron on 07/29/2016.  He drinks alcohol daily (3 glasses of wine).  He has mild dementia.   Symptomatically, he feels good. Patient has no physical complaints. Exam is normal. Labs are unremarkable.   Plan: 1.  Labs today:  CBC with diff, CMP, CEA, ferritin. 2.  Discuss interval colonoscopy. 3.  Schedule chest, abdomen, and pelvic CT on 07/26/2017. 4.  RTC after CT scan for MD assessment and labs (CBC with diff, CMP, CEA).  Anticipate switching to an every 4 month schedule after next appointment.    Honor Loh, NP  04/28/2017, 10:38 AM   I saw and evaluated the patient, participating in the key portions of the service and reviewing pertinent diagnostic studies and records.  I reviewed the  nurse practitioner's note and agree with the findings and the plan.  The assessment and plan were discussed with the patient.  A few questions were asked by the patient and answered.   Nolon Stalls, MD 04/28/2017,10:38 AM

## 2017-04-28 NOTE — Progress Notes (Signed)
Patient offers no complaints today. 

## 2017-04-29 LAB — CEA: CEA: 2.9 ng/mL (ref 0.0–4.7)

## 2017-04-30 ENCOUNTER — Encounter: Payer: Self-pay | Admitting: Hematology and Oncology

## 2017-06-15 ENCOUNTER — Other Ambulatory Visit: Payer: Self-pay | Admitting: Family Medicine

## 2017-06-15 DIAGNOSIS — K435 Parastomal hernia without obstruction or  gangrene: Secondary | ICD-10-CM

## 2017-06-16 ENCOUNTER — Ambulatory Visit
Admission: RE | Admit: 2017-06-16 | Discharge: 2017-06-16 | Disposition: A | Discharge status: Home / Self Care | Payer: Medicare Other | Source: Ambulatory Visit | Attending: Family Medicine | Admitting: Family Medicine

## 2017-06-16 DIAGNOSIS — Z933 Colostomy status: Secondary | ICD-10-CM | POA: Diagnosis not present

## 2017-06-16 DIAGNOSIS — K435 Parastomal hernia without obstruction or  gangrene: Secondary | ICD-10-CM | POA: Insufficient documentation

## 2017-06-16 LAB — POCT I-STAT CREATININE: Creatinine, Ser: 0.7 mg/dL (ref 0.61–1.24)

## 2017-06-16 MED ORDER — IOPAMIDOL (ISOVUE-300) INJECTION 61%
100.0000 mL | Freq: Once | INTRAVENOUS | Status: AC | PRN
  Administered 2017-06-16: 100 mL via INTRAVENOUS

## 2017-06-19 DIAGNOSIS — K435 Parastomal hernia without obstruction or  gangrene: Secondary | ICD-10-CM | POA: Insufficient documentation

## 2017-07-26 ENCOUNTER — Ambulatory Visit: Payer: Medicare Other

## 2017-08-09 ENCOUNTER — Telehealth: Payer: Self-pay | Admitting: *Deleted

## 2017-08-09 ENCOUNTER — Other Ambulatory Visit: Payer: Self-pay | Admitting: *Deleted

## 2017-08-09 ENCOUNTER — Other Ambulatory Visit: Payer: Self-pay | Admitting: Urgent Care

## 2017-08-09 DIAGNOSIS — C20 Malignant neoplasm of rectum: Secondary | ICD-10-CM

## 2017-08-09 NOTE — Telephone Encounter (Signed)
CT Dept. Called because patient was there to pick up his prep kit for CT Scan, Chest, Abdomen and Pelvis, and he was questioning why we were doing another CT Scan of his Abdomen and Pelvis since he just had one done in March, 2019..he thought we were just doing the Chest this time, and he doesn't think insurance will approve. Please call him @ [phone].     dhs

## 2017-08-09 NOTE — Telephone Encounter (Signed)
Called patient and LVM  to inform him that he does not have to have a CT of abdomen and pelvis because he recently had one.  He will need to go for CT of chest but will not need to drink the contrast.

## 2017-08-10 ENCOUNTER — Telehealth: Payer: Self-pay | Admitting: *Deleted

## 2017-08-10 NOTE — Telephone Encounter (Signed)
Called Glenda to inform her that patient had CT of abdomen/pelvis in March.  Will not need another one.  However, MD does want CT of chest.  Patient will not have to drink contrast.  She states patient had the CT of abdomen/pelvis in March due to having to have hernia surgery. Verbalized understanding.

## 2017-08-11 ENCOUNTER — Ambulatory Visit
Admission: RE | Admit: 2017-08-11 | Discharge: 2017-08-11 | Disposition: A | Discharge status: Home / Self Care | Payer: Medicare Other | Source: Ambulatory Visit | Attending: Urgent Care | Admitting: Urgent Care

## 2017-08-11 ENCOUNTER — Ambulatory Visit: Payer: Medicare Other

## 2017-08-11 DIAGNOSIS — I700 Atherosclerosis of aorta: Secondary | ICD-10-CM | POA: Diagnosis not present

## 2017-08-11 DIAGNOSIS — I2510 Atherosclerotic heart disease of native coronary artery without angina pectoris: Secondary | ICD-10-CM | POA: Diagnosis not present

## 2017-08-11 DIAGNOSIS — J439 Emphysema, unspecified: Secondary | ICD-10-CM | POA: Insufficient documentation

## 2017-08-11 DIAGNOSIS — C20 Malignant neoplasm of rectum: Secondary | ICD-10-CM

## 2017-08-11 DIAGNOSIS — K449 Diaphragmatic hernia without obstruction or gangrene: Secondary | ICD-10-CM | POA: Insufficient documentation

## 2017-08-25 ENCOUNTER — Other Ambulatory Visit: Payer: Medicare Other

## 2017-08-25 ENCOUNTER — Ambulatory Visit: Payer: Medicare Other | Admitting: Hematology and Oncology

## 2017-08-30 ENCOUNTER — Inpatient Hospital Stay: Payer: Medicare Other | Attending: Hematology and Oncology

## 2017-08-30 ENCOUNTER — Encounter: Payer: Self-pay | Admitting: Hematology and Oncology

## 2017-08-30 ENCOUNTER — Inpatient Hospital Stay (HOSPITAL_BASED_OUTPATIENT_CLINIC_OR_DEPARTMENT_OTHER): Payer: Medicare Other | Admitting: Hematology and Oncology

## 2017-08-30 ENCOUNTER — Other Ambulatory Visit: Payer: Self-pay

## 2017-08-30 VITALS — BP 144/73 | HR 63 | Temp 96.8°F | Resp 18 | Wt 140.4 lb

## 2017-08-30 DIAGNOSIS — Z7289 Other problems related to lifestyle: Secondary | ICD-10-CM

## 2017-08-30 DIAGNOSIS — C20 Malignant neoplasm of rectum: Secondary | ICD-10-CM

## 2017-08-30 DIAGNOSIS — I10 Essential (primary) hypertension: Secondary | ICD-10-CM

## 2017-08-30 DIAGNOSIS — Z86010 Personal history of colonic polyps: Secondary | ICD-10-CM | POA: Diagnosis not present

## 2017-08-30 DIAGNOSIS — Z85048 Personal history of other malignant neoplasm of rectum, rectosigmoid junction, and anus: Secondary | ICD-10-CM | POA: Insufficient documentation

## 2017-08-30 DIAGNOSIS — K469 Unspecified abdominal hernia without obstruction or gangrene: Secondary | ICD-10-CM | POA: Insufficient documentation

## 2017-08-30 DIAGNOSIS — F0390 Unspecified dementia without behavioral disturbance: Secondary | ICD-10-CM | POA: Diagnosis not present

## 2017-08-30 DIAGNOSIS — Z8042 Family history of malignant neoplasm of prostate: Secondary | ICD-10-CM | POA: Diagnosis not present

## 2017-08-30 DIAGNOSIS — Z87891 Personal history of nicotine dependence: Secondary | ICD-10-CM

## 2017-08-30 DIAGNOSIS — Z933 Colostomy status: Secondary | ICD-10-CM

## 2017-08-30 DIAGNOSIS — R945 Abnormal results of liver function studies: Secondary | ICD-10-CM

## 2017-08-30 DIAGNOSIS — Z9221 Personal history of antineoplastic chemotherapy: Secondary | ICD-10-CM | POA: Diagnosis not present

## 2017-08-30 DIAGNOSIS — Z923 Personal history of irradiation: Secondary | ICD-10-CM | POA: Diagnosis not present

## 2017-08-30 DIAGNOSIS — R7989 Other specified abnormal findings of blood chemistry: Secondary | ICD-10-CM

## 2017-08-30 LAB — COMPREHENSIVE METABOLIC PANEL
ALT: 33 U/L (ref 17–63)
AST: 45 U/L — ABNORMAL HIGH (ref 15–41)
Albumin: 4 g/dL (ref 3.5–5.0)
Alkaline Phosphatase: 67 U/L (ref 38–126)
Anion gap: 8 (ref 5–15)
BUN: 14 mg/dL (ref 6–20)
CO2: 27 mmol/L (ref 22–32)
Calcium: 10.1 mg/dL (ref 8.9–10.3)
Chloride: 96 mmol/L — ABNORMAL LOW (ref 101–111)
Creatinine, Ser: 0.68 mg/dL (ref 0.61–1.24)
GFR calc Af Amer: >60 mL/min (ref >60)
GFR calc non Af Amer: >60 mL/min (ref >60)
Glucose, Bld: 120 mg/dL — ABNORMAL HIGH (ref 65–99)
Potassium: 4.7 mmol/L (ref 3.5–5.1)
Sodium: 131 mmol/L — ABNORMAL LOW (ref 135–145)
Total Bilirubin: 0.9 mg/dL (ref 0.3–1.2)
Total Protein: 7.6 g/dL (ref 6.5–8.1)

## 2017-08-30 LAB — CBC WITH DIFFERENTIAL/PLATELET
Basophils Absolute: 0.1 10*3/uL (ref 0–0.1)
Basophils Relative: 1 %
Eosinophils Absolute: 0.2 10*3/uL (ref 0–0.7)
Eosinophils Relative: 4 %
HCT: 44.2 % (ref 40.0–52.0)
Hemoglobin: 15.1 g/dL (ref 13.0–18.0)
Lymphocytes Relative: 16 %
Lymphs Abs: 1 10*3/uL (ref 1.0–3.6)
MCH: 31.7 pg (ref 26.0–34.0)
MCHC: 34.1 g/dL (ref 32.0–36.0)
MCV: 92.8 fL (ref 80.0–100.0)
Monocytes Absolute: 0.7 10*3/uL (ref 0.2–1.0)
Monocytes Relative: 11 %
Neutro Abs: 4.1 10*3/uL (ref 1.4–6.5)
Neutrophils Relative %: 68 %
Platelets: 225 10*3/uL (ref 150–440)
RBC: 4.77 MIL/uL (ref 4.40–5.90)
RDW: 13.1 % (ref 11.5–14.5)
WBC: 6.1 10*3/uL (ref 3.8–10.6)

## 2017-08-30 NOTE — Progress Notes (Signed)
[facility] day: 08/30/2017   Chief Complaint: Kenneth Luna. is a 84 y.o. male with stage IIC rectal cancer who is seen for 4 month assessment.  HPI:  The patient was last seen in the medical oncology clinic on 04/28/2017.  At that time, he felt good. Patient had no physical complaints. Exam was normal. Labs were unremarkable.   Abdomen and pelvic CT on 06/16/2017 revealed small bowel herniated through the superior margin of the left lower quadrant colostomy defect, accounting for the patient's swelling around his colostomy stoma. There was no evidence, however, a bowel obstruction, incarceration or strangulation. There was no bowel wall thickening or inflammatory changes.  There was no acute findings within the abdomen or pelvis.  There was no evidence of locally recurrent or metastatic rectal carcinoma.  Chest CT on 08/11/2017 revealed no suspicious findings to suggest metastasis within the chest.  He has aortic atherosclerosis, emphysema, coronary artery atherosclerosis and a small hiatal hernia. Patient underwent parastomal hernia repair on 07/11/2017 at [facility] by Dr. Kathline Magic.   During the interim, patient is doing well today. There are no acute concerns. Patient denies B symptoms and interval infections. Patient is eating well. Weight has decreased by 5 pounds. Patient is active. He is working out at [employer] 3 days a week for 35 minutes. Patient drinks Pinot Grigio wine daily. He drinks 6 bottles of wince per week.   Patient denies pain in the clinic today.    Past Medical History:  Diagnosis Date  . Allergic rhinitis   . Anemia   . B12 deficiency 06/29/2015  . Benign neoplasm of ascending colon   . Benign neoplasm of descending colon   . Colon polyp   . Colostomy in place [facility])   . DD (diverticular disease) 06/29/2015  . Dementia    memory issues  . Fatty liver   . GERD (gastroesophageal reflux disease)   . Glaucoma   .  Hyperlipidemia   . Hypertension   . Rectal adenocarcinoma ([place]) 07/15/2015  . Substance abuse ([place])    alcohol  . Traumatic intraparenchymal hemorrhage [facility])     Past Surgical History:  Procedure Laterality Date  . ABDOMINOPERINEAL PROCTOCOLECTOMY  12/17/2015   [facility]  . CATARACT EXTRACTION W/ INTRAOCULAR LENS IMPLANT    . COLONOSCOPY WITH PROPOFOL N/A 07/10/2015   Procedure: COLONOSCOPY WITH PROPOFOL;  Surgeon: Lucilla Lame, MD;  Location: [place];  Service: Endoscopy;  Laterality: N/A;  PER CAREGIVER WAS TOLD THAT PT WOULD BE 1ST (KEEP PT EARLY AM)  . COLONOSCOPY WITH PROPOFOL N/A 04/24/2017   Procedure: COLONOSCOPY WITH PROPOFOL;  Surgeon: Lucilla Lame, MD;  Location: [place];  Service: Endoscopy;  Laterality: N/A;  . POLYPECTOMY  07/10/2015   Procedure: POLYPECTOMY;  Surgeon: Lucilla Lame, MD;  Location: [place];  Service: Endoscopy;;    Family History  Problem Relation Age of Onset  . Cancer Father        Prostate    Social History:  reports that he quit smoking about 39 years ago. His smoking use included pipe. He has never used smokeless tobacco. He reports that he drinks about 16.8 oz of alcohol per week. He reports that he does not use drugs.  He drinks 3 glasses of wine a day.  The patient is widowed.  He has no children.  The patient is accompanied by his caregiver, [name], today.  Allergies: No Known Allergies  Current Medications:  Current Outpatient Medications  Medication Sig Dispense Refill  . acetaminophen (TYLENOL) 325 MG tablet Take by mouth.    . bevacizumab (AVASTIN) 400 MG/16ML SOLN Administer into left eye once monthly x 3 months    . brimonidine-timolol (COMBIGAN) 0.2-0.5 % ophthalmic solution Place 1 drop into both eyes 2 (two) times daily.    . cetirizine (ZYRTEC) 10 MG tablet Take 10 mg by mouth daily.     . cholecalciferol (VITAMIN D) 1000 units tablet Take 1,000 Units by mouth daily.    Marland Kitchen docusate sodium (COLACE) 100  MG capsule Take 100 mg by mouth daily.    Marland Kitchen donepezil (ARICEPT) 10 MG tablet Take 10 mg by mouth at bedtime.    . folic acid (FOLVITE) 1 MG tablet Take 1 tablet (1 mg total) by mouth daily. 30 tablet 2  . gabapentin (NEURONTIN) 100 MG capsule     . montelukast (SINGULAIR) 10 MG tablet Take 10 mg by mouth at bedtime. Reported on 08/21/2015    . Multiple Vitamin (MULTIVITAMIN) tablet Take 1 tablet by mouth daily.    Marland Kitchen omeprazole (PRILOSEC) 20 MG capsule Take 1 capsule (20 mg total) by mouth daily. 30 capsule 0  . vitamin B-12 (CYANOCOBALAMIN) 1000 MCG tablet Take 1,000 mcg by mouth daily.    . capecitabine (XELODA) 500 MG tablet Take 3 tablets (1,500 mg total) by mouth 2 (two) times daily after a meal. Take on days 1-14. Repeat every 21 days. (Patient not taking: Reported on 08/30/2017) 84 tablet 1  . clobetasol cream (TEMOVATE) 0.05 % Apply topically.    . fluticasone (FLONASE) 50 MCG/ACT nasal spray Place 2 sprays into both nostrils daily as needed for rhinitis.     . furosemide (LASIX) 20 MG tablet Take by mouth.    . hydrocortisone (ANUSOL-HC) 25 MG suppository Place 1 suppository (25 mg total) rectally 2 (two) times daily. (Patient not taking: Reported on 08/30/2017) 12 suppository 0  . Iron-Vitamin C (IRON 100/C PO) Take by mouth.    . lidocaine (XYLOCAINE) 5 % ointment   0  . metoprolol succinate (TOPROL XL) 25 MG 24 hr tablet Take 1 tablet (25 mg total) by mouth daily. (Patient not taking: Reported on 08/30/2017) 30 tablet 0  . mupirocin ointment (BACTROBAN) 2 % Apply 1 application topically daily as needed.   0  . ondansetron (ZOFRAN) 8 MG tablet Take 1 tablet (8 mg total) by mouth every 8 (eight) hours as needed for nausea or vomiting. (Patient not taking: Reported on 08/30/2017) 20 tablet 1  . thiamine 100 MG tablet Take 1 tablet (100 mg total) by mouth daily. (Patient not taking: Reported on 08/30/2017) 30 tablet 2  . valsartan (DIOVAN) 80 MG tablet Take 80 mg by mouth daily.     No current  facility-administered medications for this visit.     Review of Systems  Constitutional: Positive for weight loss (5 pounds since last visit). Negative for diaphoresis, fever and malaise/fatigue.  HENT: Negative.  Negative for congestion, ear pain, nosebleeds, sinus pain and sore throat.   Eyes: Negative.  Negative for blurred vision and double vision.  Respiratory: Negative.  Negative for cough, hemoptysis, sputum production, shortness of breath and wheezing.   Cardiovascular: Negative.  Negative for chest pain, palpitations, orthopnea, leg swelling and PND.  Gastrointestinal: Negative.  Negative for abdominal pain, blood in stool, constipation, diarrhea, melena, nausea and vomiting.       Colostomy  Genitourinary: Negative.  Negative for dysuria, frequency, hematuria and urgency.  Musculoskeletal: Positive for back pain (chronic due to T12 compression fracture). Negative for falls, joint pain and myalgias.  Skin: Negative.  Negative for itching and rash.  Neurological: Negative.  Negative for dizziness, tremors, weakness and headaches.       Chronic balance and coordination issues  Endo/Heme/Allergies: Negative.  Does not bruise/bleed easily.  Psychiatric/Behavioral: Positive for memory loss (dementia). Negative for depression and suicidal ideas. The patient is not nervous/anxious and does not have insomnia.   All other systems reviewed and are negative.  Physical Exam: Blood pressure (!) 144/73, pulse 63, temperature (!) 96.8 F (36 C), temperature source Tympanic, resp. rate 18, weight 140 lb 6.9 oz (63.7 kg). GENERAL:  Well developed, well nourished, elderly gentleman sitting comfortably in the exam room in no acute distress.  He has a 4 point cane at his side. MENTAL STATUS:  Alert and oriented to person, place and time. HEAD:  Pearline Cables hair.  Normocephalic, atraumatic, face symmetric, no Cushingoid features. EYES:  Glasses.  Blue eyes.  Pupils equal round and reactive to light and  accomodation.  No conjunctivitis or scleral icterus. ENT:  Oropharynx clear without lesion.  Tongue normal. Mucous membranes moist.  RESPIRATORY:  Clear to auscultation without rales, wheezes or rhonchi. CARDIOVASCULAR:  Regular rate and rhythm without murmur, rub or gallop. ABDOMEN:  Soft, non-tender, with active bowel sounds, and no hepatosplenomegaly.  No masses.  Ostomy. SKIN:  No rashes, ulcers or lesions. EXTREMITIES: No edema, no skin discoloration or tenderness.  No palpable cords. LYMPH NODES: No palpable cervical, supraclavicular, axillary or inguinal adenopathy  NEUROLOGICAL: Unremarkable. PSYCH:  Appropriate.    Appointment on 08/30/2017  Component Date Value Ref Range Status  . Sodium 08/30/2017 131* 135 - 145 mmol/L Final  . Potassium 08/30/2017 4.7  3.5 - 5.1 mmol/L Final  . Chloride 08/30/2017 96* 101 - 111 mmol/L Final  . CO2 08/30/2017 27  22 - 32 mmol/L Final  . Glucose, Bld 08/30/2017 120* 65 - 99 mg/dL Final  . BUN 08/30/2017 14  6 - 20 mg/dL Final  . Creatinine, Ser 08/30/2017 0.68  0.61 - 1.24 mg/dL Final  . Calcium 08/30/2017 10.1  8.9 - 10.3 mg/dL Final  . Total Protein 08/30/2017 7.6  6.5 - 8.1 g/dL Final  . Albumin 08/30/2017 4.0  3.5 - 5.0 g/dL Final  . AST 08/30/2017 45* 15 - 41 U/L Final  . ALT 08/30/2017 33  17 - 63 U/L Final  . Alkaline Phosphatase 08/30/2017 67  38 - 126 U/L Final  . Total Bilirubin 08/30/2017 0.9  0.3 - 1.2 mg/dL Final  . GFR calc non Af Amer 08/30/2017 >60  >60 mL/min Final  . GFR calc Af Amer 08/30/2017 >60  >60 mL/min Final   Comment: (NOTE) The eGFR has been calculated using the CKD EPI equation. This calculation has not been validated in all clinical situations. eGFR's persistently <60 mL/min signify possible Chronic Kidney Disease.   Georgiann Hahn gap 08/30/2017 8  5 - 15 Final   Performed at [facility], [address], [place], [place] [address]  . WBC 08/30/2017 6.1  3.8 - 10.6 K/uL Final  . RBC 08/30/2017 4.77   4.40 - 5.90 MIL/uL Final  . Hemoglobin 08/30/2017 15.1  13.0 - 18.0 g/dL Final  . HCT 08/30/2017 44.2  40.0 - 52.0 % Final  . MCV 08/30/2017 92.8  80.0 - 100.0 fL Final  . MCH 08/30/2017 31.7  26.0 - 34.0 pg Final  . MCHC 08/30/2017  34.1  32.0 - 36.0 g/dL Final  . RDW 08/30/2017 13.1  11.5 - 14.5 % Final  . Platelets 08/30/2017 225  150 - 440 K/uL Final  . Neutrophils Relative % 08/30/2017 68  % Final  . Neutro Abs 08/30/2017 4.1  1.4 - 6.5 K/uL Final  . Lymphocytes Relative 08/30/2017 16  % Final  . Lymphs Abs 08/30/2017 1.0  1.0 - 3.6 K/uL Final  . Monocytes Relative 08/30/2017 11  % Final  . Monocytes Absolute 08/30/2017 0.7  0.2 - 1.0 K/uL Final  . Eosinophils Relative 08/30/2017 4  % Final  . Eosinophils Absolute 08/30/2017 0.2  0 - 0.7 K/uL Final  . Basophils Relative 08/30/2017 1  % Final  . Basophils Absolute 08/30/2017 0.1  0 - 0.1 K/uL Final   Performed at [facility], [address], [place], [place] [address]    Assessment:  Deane Wattenbarger. is a 84 y.o. male with stage IIC (T4bN0M0) low rectal adenocarcinoma s/p neoadjuvant chemotherapy and radiation followed by surgery.  He presented with rectal bleeding.  Colonoscopy on 07/10/2015 revealed a 4 cm frond-like/villous ulcerated non-obstructing mass in the rectum.  Biopsy revealed adenocarcinoma.  Chest, abdomen, and pelvic CT scan on 07/27/2015 revealed a circumferential soft tissue mass within the low rectum. He was a T12 compression fracture.  CEA was 4.1 on 07/26/2015.  Flexible sigmoidoscopy and ultrasound on 08/19/2015 revealed a 3.0 x 1.4 cm hypoechoic non-circumferential mass was found in the rectum.  There was no sonographic evidence suggesting breakthrough of the muscularis propria with invasion into the perirectal fat.  No lymph nodes were seen in the perirectal region and in the left iliac region. Stage was early T3uN0.  He received 5 weeks of concurrent Xeloda and radiation (09/09/2015 -  10/22/2015).  He underwent robotic assisted APR with end colostomy at [facility] on 12/17/2015 at [facility].  Pathology revealed ypT4bN0M0 (stage IIC) invasive moderately differentiated adenocarcinoma.  Levator muscle was involved.  He received 6 cycles of adjuvant Xeloda (02/15/2016 -  06/06/2016).  Cycle #3 was truncated after 1 week secondary to palmar plantar erythrodysesthesia (hand foot syndrome).  Xeloda was dose reduced with cycle #4.  Chest, abdomen, and pelvic CT on 07/26/2016 revealed smooth uniform soft tissue thickening in the presacral space, compatible with posttreatment change.  There were no findings suspicious for local tumor recurrence in the pelvis.  There was no evidence of metastatic disease in the chest, abdomen or pelvis.  There was moderate superior L1 vertebral compression fracture of indeterminate chronicity, new since 08/08/2015.   Abdomen and pelvic CT on 06/16/2017 revealed small bowel herniated through the superior margin of the left lower quadrant colostomy defect, accounting for the patient's swelling around his colostomy stoma. There was no evidence, however, a bowel obstruction, incarceration or strangulation. There was no evidence of locally recurrent or metastatic rectal carcinoma.  Chest CT on 08/11/2017 revealed no suspicious findings to suggest metastasis within the chest.    He underwent parastomal repair at [facility] on 07/11/2017.  CEA has been followed:  4.1 on 07/26/2015, 2.6 on 02/01/2016, 2.9 on 07/25/2016, 2.9 on 10/28/2016, 3.1 on 01/27/2017, and 2.9 on 04/28/2017.  Colonoscopy on 04/24/2017 revealed a 3 mm polpy in the ascending colon (tubular adenoma).  He had diverticulosis throughout the colon.  He was admitted to [facility] from 07/24/2015 - 07/30/2015 with acute right basal ganglia hemorrhage and left humeral head fracture s/p a fall on 07/21/2015.     He was admitted to [facility] in [place] from 08/08/2015 - 08/09/2015 with acute diverticulitis.  He was discharged  home on Augmentin rather than Flagyl given his alcohol dependency.    He has iron deficiency anemia.  Anemia workup on 03/07/2016 revealed a ferritin of 13, iron saturation 5% and TIBC 469 (high).  B12 was 501. Folate was > 47.  He takes oral B12 daily.  Diet is good.  Ferritin was 43 on 04/19/2016, 99 on 07/25/2016, 50 on 10/28/2016, and 62 on 04/28/2017.  He discontinued oral iron on 07/29/2016.    He drinks alcohol daily (6 bottles/week).  He has mild dementia.   Symptomatically, he feels good. Patient has no physical complaints. Exam is normal.  Patient has periodic elevations in LFTs (last 09/2015).   Plan: 1.  Labs today:  CBC with diff, CMP, CEA. 2.  Review CT scans- no evidence of metastatic disease.  Discuss small bowel herniation through stoma. Patient had hernia repair in March at [facility].  3.  RTC in 1 month for labs (LFTs). If LFTs continue to be elevated, we will proceed with hepatitis testing.  4.  RTC in 4 months for MD assessment and labs (CBC with diff, CMP, CEA).     Honor Loh, NP  08/30/2017, 3:15 PM   I saw and evaluated the patient, participating in the key portions of the service and reviewing pertinent diagnostic studies and records.  I reviewed the nurse practitioner's note and agree with the findings and the plan.  The assessment and plan were discussed with the patient.  Several questions were asked by the patient and answered.   Nolon Stalls, MD 08/30/2017,3:15 PM

## 2017-08-30 NOTE — Progress Notes (Signed)
Patient here today for follow up. Accompanied by caregiver. Pt voices no complaints.

## 2017-08-31 LAB — CEA: CEA: 4.3 ng/mL (ref 0.0–4.7)

## 2017-09-29 ENCOUNTER — Inpatient Hospital Stay: Payer: Medicare Other | Attending: Hematology and Oncology

## 2017-09-29 ENCOUNTER — Other Ambulatory Visit: Payer: Medicare Other

## 2017-09-29 DIAGNOSIS — Z85048 Personal history of other malignant neoplasm of rectum, rectosigmoid junction, and anus: Secondary | ICD-10-CM | POA: Diagnosis present

## 2017-09-29 DIAGNOSIS — C20 Malignant neoplasm of rectum: Secondary | ICD-10-CM

## 2017-09-29 LAB — HEPATIC FUNCTION PANEL
ALT: 28 U/L (ref 17–63)
AST: 40 U/L (ref 15–41)
Albumin: 4.2 g/dL (ref 3.5–5.0)
Alkaline Phosphatase: 59 U/L (ref 38–126)
Bilirubin, Direct: <0.1 mg/dL — ABNORMAL LOW (ref 0.1–0.5)
Total Bilirubin: 0.7 mg/dL (ref 0.3–1.2)
Total Protein: 7.5 g/dL (ref 6.5–8.1)

## 2017-10-09 ENCOUNTER — Ambulatory Visit
Admission: RE | Admit: 2017-10-09 | Discharge: 2017-10-09 | Disposition: A | Discharge status: Home / Self Care | Payer: Medicare Other | Source: Ambulatory Visit | Attending: Radiation Oncology | Admitting: Radiation Oncology

## 2017-10-09 ENCOUNTER — Encounter: Payer: Self-pay | Admitting: Radiation Oncology

## 2017-10-09 ENCOUNTER — Other Ambulatory Visit: Payer: Self-pay

## 2017-10-09 VITALS — BP 128/58 | HR 45 | Temp 96.6°F | Resp 18 | Wt 139.8 lb

## 2017-10-09 DIAGNOSIS — Z923 Personal history of irradiation: Secondary | ICD-10-CM | POA: Insufficient documentation

## 2017-10-09 DIAGNOSIS — C20 Malignant neoplasm of rectum: Secondary | ICD-10-CM | POA: Diagnosis not present

## 2017-10-09 DIAGNOSIS — Z9221 Personal history of antineoplastic chemotherapy: Secondary | ICD-10-CM | POA: Insufficient documentation

## 2017-10-09 DIAGNOSIS — Z933 Colostomy status: Secondary | ICD-10-CM | POA: Insufficient documentation

## 2017-10-09 NOTE — Progress Notes (Signed)
Radiation Oncology Follow up Note  Name: Kenneth Luna.   Date:   10/09/2017 MRN:  [id number] DOB: 1932-06-04    This 84 y.o. male presents to the clinic today for to year follow-up status post neoadjuvant chemoradiation prior prior to surgical resection for rectal carcinoma stage IIa.Marland Kitchen  REFERRING PROVIDER: Dion Body, MD  HPI: patient is a 84 year old male now out 2 years having completed neoadjuvant chemoradiation prior to surgical resection of rectal cancer. He is seen today in routine follow-up and is doing well. He did have a revision of his colostomy.cT scan had demonstrated herniation through the superior margin left lower quadrant colostomy defect accounting for the patient's colostomy swelling. This was revised successfully. CT scan at that time also demonstrated no evidence of metastatic disease or local recurrence. Patient is doing well specifically denies any abdominal complaints. He states his colostomy is functioning well.  COMPLICATIONS OF TREATMENT: none  FOLLOW UP COMPLIANCE: keeps appointments   PHYSICAL EXAM:  BP (!) 128/58   Pulse (!) 45   Temp (!) 96.6 F (35.9 C)   Resp 18   Wt 139 lb 12.4 oz (63.4 kg)   BMI 23.26 kg/m  Patient is active functioning colostomy. Abdomen is benign with positive bowel sounds all 4 quadrants. Well-developed well-nourished patient in NAD. HEENT reveals PERLA, EOMI, discs not visualized.  Oral cavity is clear. No oral mucosal lesions are identified. Neck is clear without evidence of cervical or supraclavicular adenopathy. Lungs are clear to A&P. Cardiac examination is essentially unremarkable with regular rate and rhythm without murmur rub or thrill. Abdomen is benign with no organomegaly or masses noted. Motor sensory and DTR levels are equal and symmetric in the upper and lower extremities. Cranial nerves II through XII are grossly intact. Proprioception is intact. No peripheral adenopathy or edema is identified. No motor  or sensory levels are noted. Crude visual fields are within normal range.  RADIOLOGY RESULTS: CT scans of abdomen chest and pelvis reviewed and compatible above-stated findings  PLAN: present time he continues to do well with no evidence of disease. I'm going to discontinue follow-up care in this patient's currently seeing multiple physicians. I've asked for him to call with any concerns or problems at any time.  I would like to take this opportunity to thank you for allowing me to participate in the care of your patient.Noreene Filbert, MD

## 2017-12-05 IMAGING — CT CT HEAD W/O CM
2 series · 16 of 30 positions shown, 18 images · non-contrast
Comparison: Brain MRI 01/08/2010.

CLINICAL DATA: Fall [REDACTED], has dizziness are after the fall

EXAM:
CT HEAD WITHOUT CONTRAST
TECHNIQUE: Contiguous axial images were obtained from the base of the skull
through the vertex without intravenous contrast.

[Series 2: head wo · axial · 0.42mm/px · z∈[+1001,+1127]mm · 8 of 34 slices shown, 10 images]
[im 4/34  brain]
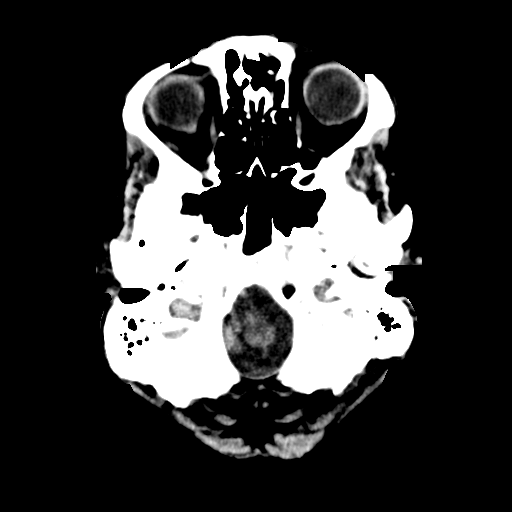
[im 4/34  bone]
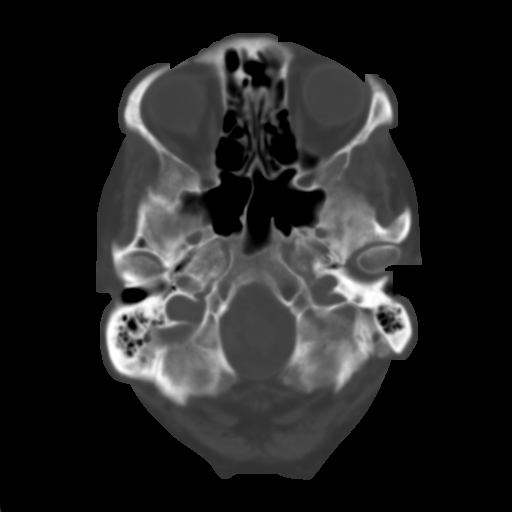
[im 8/34  brain]
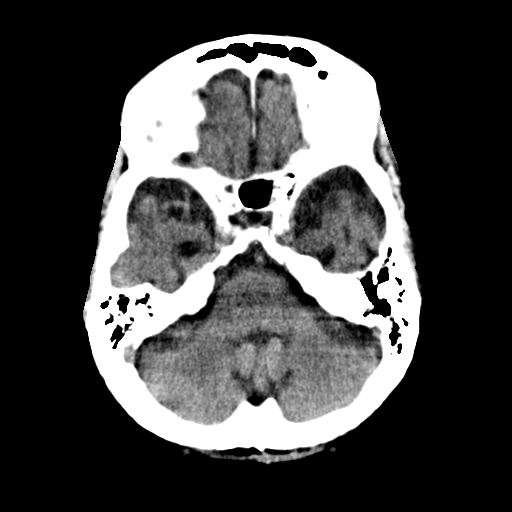
[im 12/34  brain]
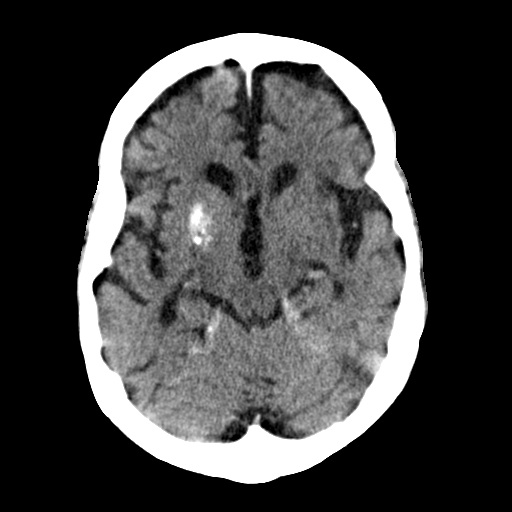
[im 15/34  brain]
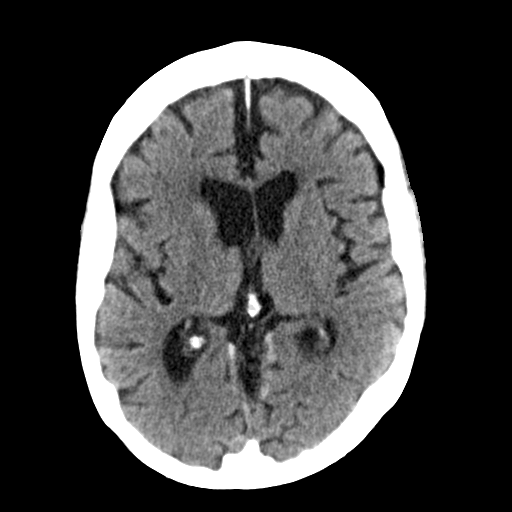
[im 19/34  brain]
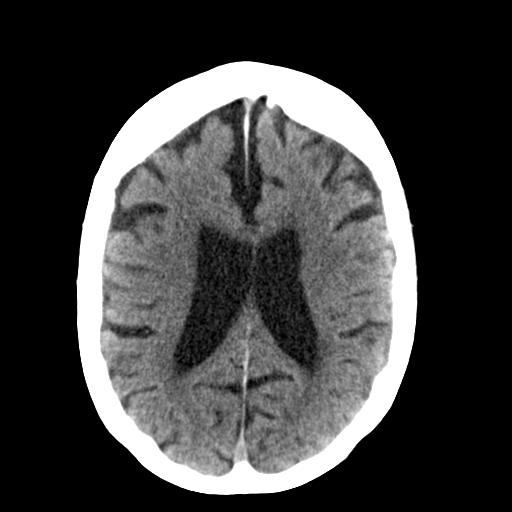
[im 19/34  bone]
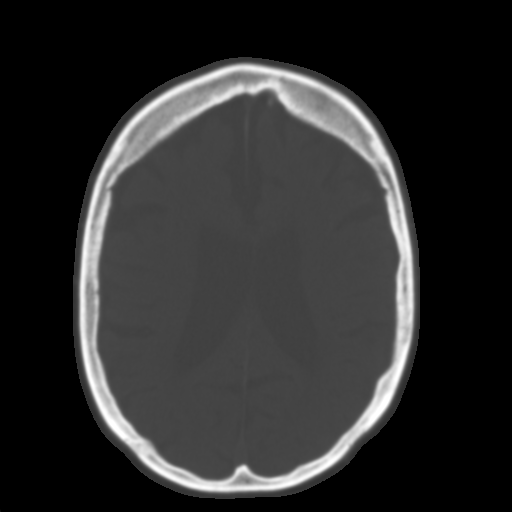
[im 23/34  brain]
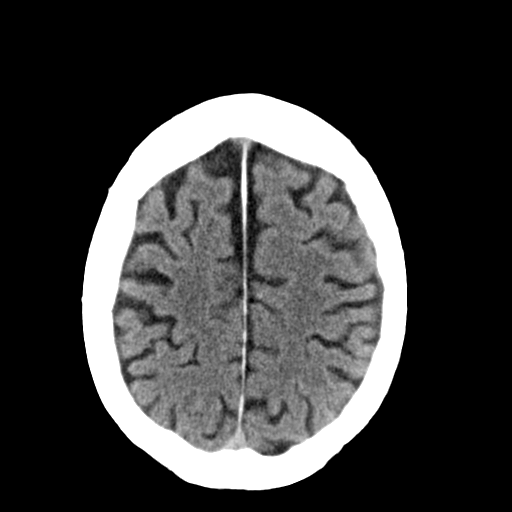
[im 26/34  brain]
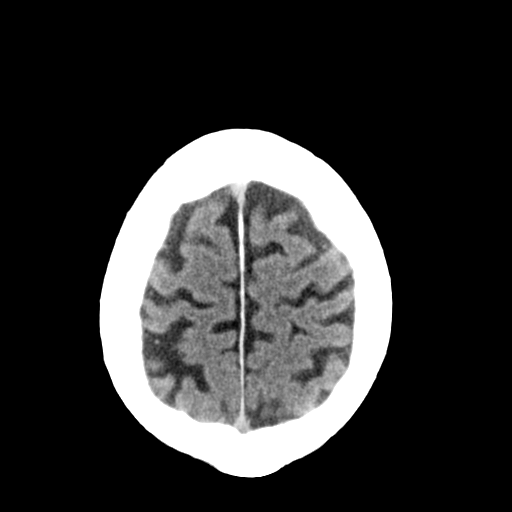
[im 30/34  brain]
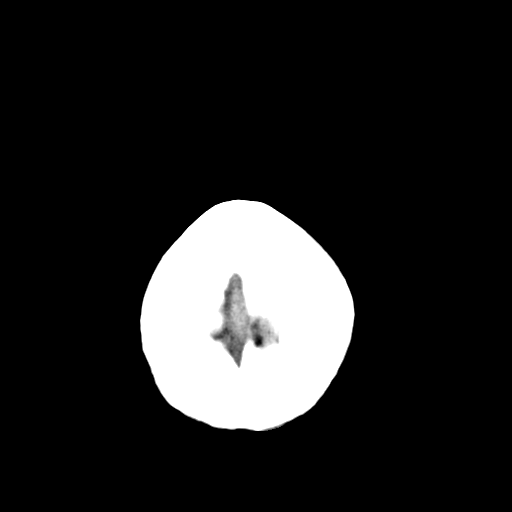

[Series 3: head bone · axial · 0.42mm/px · z∈[+1001,+1136]mm · 8 of 72 slices shown]
[im 8/72  bone]
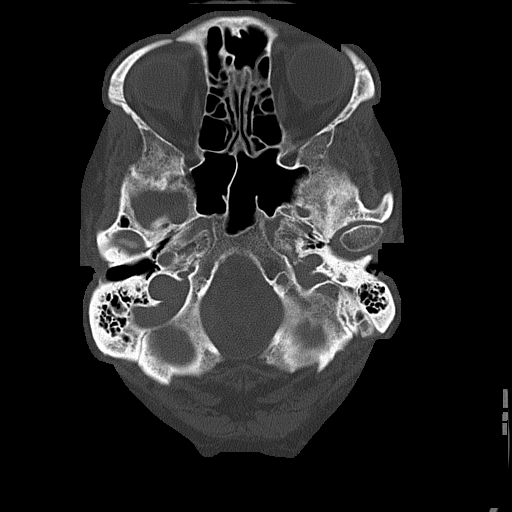
[im 15/72  bone]
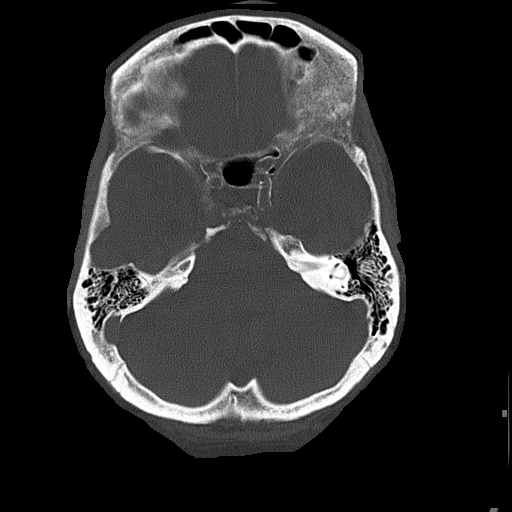
[im 23/72  bone]
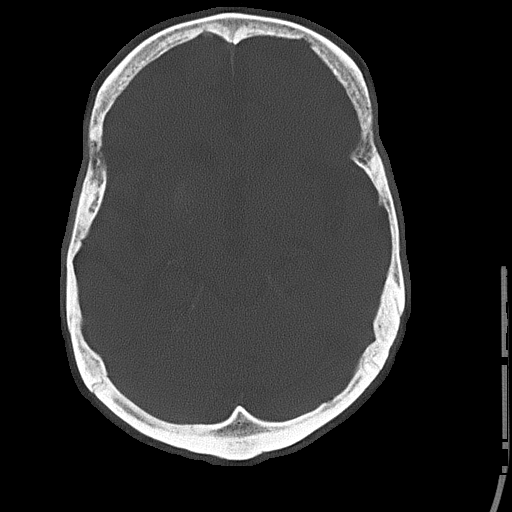
[im 30/72  bone]
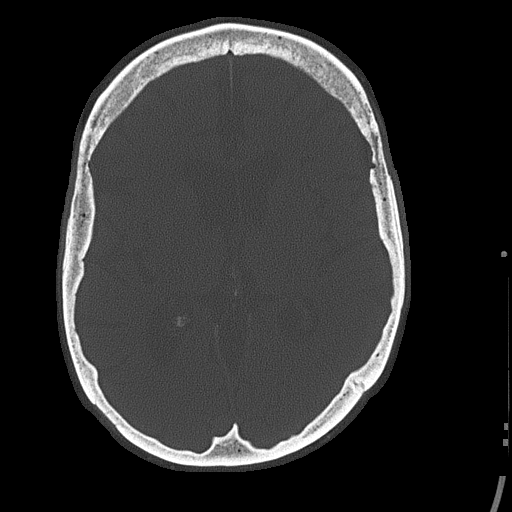
[im 42/72  bone]
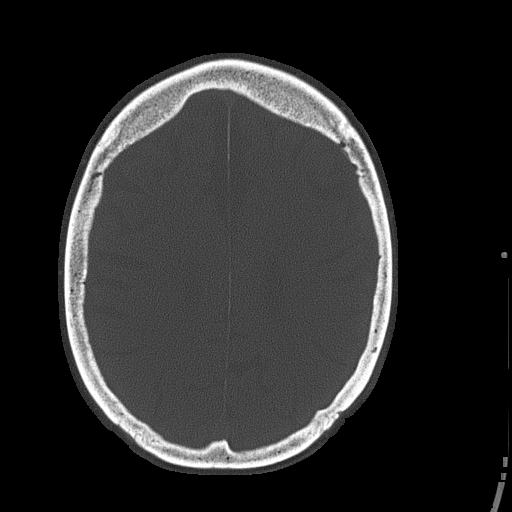
[im 49/72  bone]
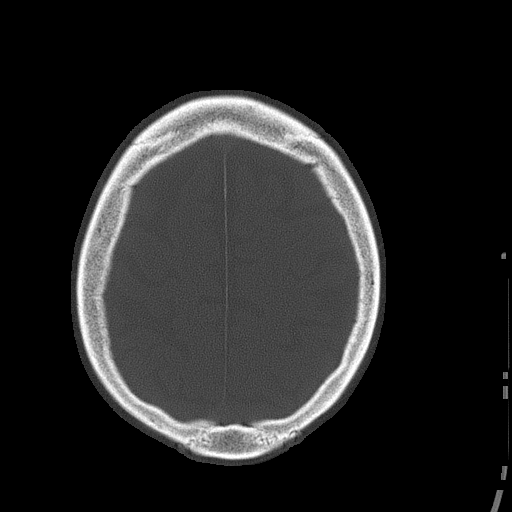
[im 57/72  bone]
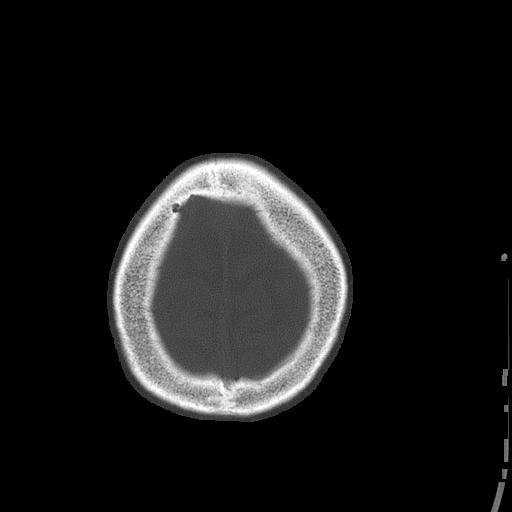
[im 64/72  bone]
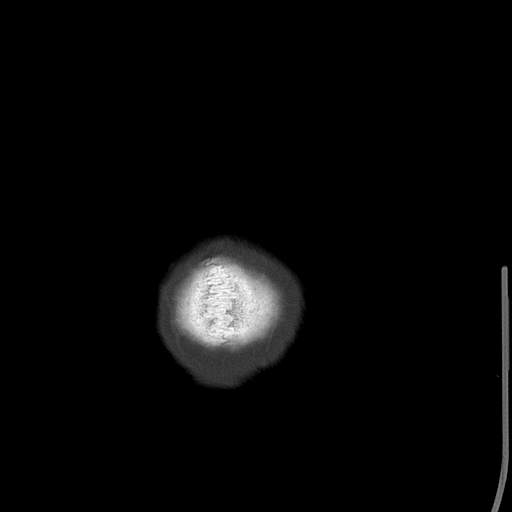

[16 of 30 positions shown; findings below may reference images not displayed]

FINDINGS: Mild cerebral atrophy is noted. Mild periventricular white matter
decreased attenuation probable due to chronic small vessel ischemic
changes. There is punctate calcification in right basal ganglia.
There is high-density material in right basal ganglia suspicious for
hemorrhage. Measures about 9.8 cm. No definite mass effect or
midline shift. A vascular malformation in right basal ganglia cannot
be excluded. Further correlation with MRI is recommended. No
intraventricular hemorrhage. No definite acute cortical infarction.
IMPRESSION: There is high-density material in right basal ganglia suspicious for
hemorrhage. Measures about 9.8 cm. No definite mass effect or
midline shift. A vascular malformation in right basal ganglia cannot
be excluded. Further correlation with MRI is recommended. No
intraventricular hemorrhage. No definite acute cortical infarction.
Mild cerebral atrophy. Mild periventricular white matter decreased
attenuation probable due to chronic small vessel ischemic changes.

These results were called by telephone at the time of interpretation
on 07/23/2015 at [DATE] to Dr. AYAHE ZAEN JONNO MON , who verbally
acknowledged these results.

## 2018-01-01 ENCOUNTER — Other Ambulatory Visit: Payer: Self-pay

## 2018-01-01 ENCOUNTER — Other Ambulatory Visit: Payer: Self-pay | Admitting: Hematology and Oncology

## 2018-01-01 ENCOUNTER — Inpatient Hospital Stay: Payer: Medicare Other

## 2018-01-01 ENCOUNTER — Inpatient Hospital Stay: Payer: Medicare Other | Attending: Hematology and Oncology | Admitting: Hematology and Oncology

## 2018-01-01 ENCOUNTER — Encounter: Payer: Self-pay | Admitting: Hematology and Oncology

## 2018-01-01 VITALS — BP 155/82 | HR 60 | Temp 98.3°F | Resp 18 | Wt 142.7 lb

## 2018-01-01 DIAGNOSIS — C20 Malignant neoplasm of rectum: Secondary | ICD-10-CM | POA: Insufficient documentation

## 2018-01-01 DIAGNOSIS — Z87891 Personal history of nicotine dependence: Secondary | ICD-10-CM

## 2018-01-01 DIAGNOSIS — E538 Deficiency of other specified B group vitamins: Secondary | ICD-10-CM | POA: Insufficient documentation

## 2018-01-01 DIAGNOSIS — Z8042 Family history of malignant neoplasm of prostate: Secondary | ICD-10-CM | POA: Diagnosis not present

## 2018-01-01 DIAGNOSIS — R7989 Other specified abnormal findings of blood chemistry: Secondary | ICD-10-CM

## 2018-01-01 DIAGNOSIS — D509 Iron deficiency anemia, unspecified: Secondary | ICD-10-CM | POA: Diagnosis not present

## 2018-01-01 LAB — CBC WITH DIFFERENTIAL/PLATELET
Basophils Absolute: 0.1 10*3/uL (ref 0–0.1)
Basophils Relative: 1 %
Eosinophils Absolute: 0.2 10*3/uL (ref 0–0.7)
Eosinophils Relative: 3 %
HCT: 44 % (ref 40.0–52.0)
Hemoglobin: 15 g/dL (ref 13.0–18.0)
Lymphocytes Relative: 18 %
Lymphs Abs: 1.2 10*3/uL (ref 1.0–3.6)
MCH: 33.1 pg (ref 26.0–34.0)
MCHC: 34 g/dL (ref 32.0–36.0)
MCV: 97.1 fL (ref 80.0–100.0)
Monocytes Absolute: 0.6 10*3/uL (ref 0.2–1.0)
Monocytes Relative: 9 %
Neutro Abs: 4.5 10*3/uL (ref 1.4–6.5)
Neutrophils Relative %: 69 %
Platelets: 219 10*3/uL (ref 150–440)
RBC: 4.53 MIL/uL (ref 4.40–5.90)
RDW: 14.4 % (ref 11.5–14.5)
WBC: 6.6 10*3/uL (ref 3.8–10.6)

## 2018-01-01 LAB — FERRITIN: Ferritin: 45 ng/mL (ref 24–336)

## 2018-01-01 LAB — FOLATE: Folate: 47 ng/mL (ref >5.9)

## 2018-01-01 LAB — COMPREHENSIVE METABOLIC PANEL
ALT: 31 U/L (ref 0–44)
AST: 36 U/L (ref 15–41)
Albumin: 4.3 g/dL (ref 3.5–5.0)
Alkaline Phosphatase: 65 U/L (ref 38–126)
Anion gap: 8 (ref 5–15)
BUN: 16 mg/dL (ref 8–23)
CO2: 27 mmol/L (ref 22–32)
Calcium: 10.3 mg/dL (ref 8.9–10.3)
Chloride: 98 mmol/L (ref 98–111)
Creatinine, Ser: 0.81 mg/dL (ref 0.61–1.24)
GFR calc Af Amer: >60 mL/min (ref >60)
GFR calc non Af Amer: >60 mL/min (ref >60)
Glucose, Bld: 114 mg/dL — ABNORMAL HIGH (ref 70–99)
Potassium: 4.4 mmol/L (ref 3.5–5.1)
Sodium: 133 mmol/L — ABNORMAL LOW (ref 135–145)
Total Bilirubin: 0.7 mg/dL (ref 0.3–1.2)
Total Protein: 7.4 g/dL (ref 6.5–8.1)

## 2018-01-01 LAB — VITAMIN B12: Vitamin B-12: 430 pg/mL (ref 180–914)

## 2018-01-01 NOTE — Progress Notes (Signed)
Here for follow up. Stated doing " well overall- I feel good " Here  w [facility] ( pt has 24/7 aides )

## 2018-01-01 NOTE — Progress Notes (Signed)
[facility] day: 01/01/2018   Chief Complaint: Kenneth Luna. is a 84 y.o. male with stage IIC rectal cancer who is seen for 4 month assessment.  HPI:  The patient was last seen in the medical oncology clinic on 08/30/2017.  At that time, he felt good. Patient had no physical complaints. Exam was normal.  Patient had periodic elevations in LFTs (last 09/2015).  CT scans revealed no evidence of metastatic disease.  CEA was 4.3.  LFTs on 09/29/2017 were normal.  During the interim, he has done well.  He voices no concerns.  He denies any abdominal pain.  No nausea or vomiting.   Past Medical History:  Diagnosis Date  . Allergic rhinitis   . Anemia   . B12 deficiency 06/29/2015  . Benign neoplasm of ascending colon   . Benign neoplasm of descending colon   . Colon polyp   . Colostomy in place [facility])   . DD (diverticular disease) 06/29/2015  . Dementia    memory issues  . Fatty liver   . GERD (gastroesophageal reflux disease)   . Glaucoma   . Hyperlipidemia   . Hypertension   . Rectal adenocarcinoma ([place]) 07/15/2015  . Substance abuse ([place])    alcohol  . Traumatic intraparenchymal hemorrhage [facility])     Past Surgical History:  Procedure Laterality Date  . ABDOMINOPERINEAL PROCTOCOLECTOMY  12/17/2015   [facility]  . CATARACT EXTRACTION W/ INTRAOCULAR LENS IMPLANT    . COLONOSCOPY WITH PROPOFOL N/A 07/10/2015   Procedure: COLONOSCOPY WITH PROPOFOL;  Surgeon: Lucilla Lame, MD;  Location: [place];  Service: Endoscopy;  Laterality: N/A;  PER CAREGIVER WAS TOLD THAT PT WOULD BE 1ST (KEEP PT EARLY AM)  . COLONOSCOPY WITH PROPOFOL N/A 04/24/2017   Procedure: COLONOSCOPY WITH PROPOFOL;  Surgeon: Lucilla Lame, MD;  Location: [place];  Service: Endoscopy;  Laterality: N/A;  . POLYPECTOMY  07/10/2015   Procedure: POLYPECTOMY;  Surgeon: Lucilla Lame, MD;  Location: [place];  Service: Endoscopy;;    Family History   Problem Relation Age of Onset  . Cancer Father        Prostate    Social History:  reports that he quit smoking about 39 years ago. His smoking use included pipe. He has never used smokeless tobacco. He reports that he drinks about 28.0 standard drinks of alcohol per week. He reports that he does not use drugs.  He drinks 3 glasses of wine a day.  The patient is widowed.  He has no children.  The patient is accompanied by his caregiver, [name], today.  Allergies: No Known Allergies  Current Medications: Current Outpatient Medications  Medication Sig Dispense Refill  . B Complex Vitamins (VITAMIN B COMPLEX 100 IJ) Take by mouth.    . bevacizumab (AVASTIN) 400 MG/16ML SOLN Administer into left eye once monthly x 3 months    . brimonidine-timolol (COMBIGAN) 0.2-0.5 % ophthalmic solution Place 1 drop into both eyes 2 (two) times daily.    . cetirizine (ZYRTEC) 10 MG tablet Take 10 mg by mouth daily.     . cholecalciferol (VITAMIN D) 1000 units tablet Take 1,000 Units by mouth daily.    Marland Kitchen docusate sodium (COLACE) 100 MG capsule Take 100 mg by mouth daily.    Marland Kitchen donepezil (ARICEPT) 10 MG tablet Take 10 mg by mouth at bedtime.    . gabapentin (NEURONTIN) 100 MG capsule Take 100 mg by mouth 2 (two) times  daily.     . Multiple Vitamin (MULTIVITAMIN) tablet Take 1 tablet by mouth daily.    . Multiple Vitamins-Minerals (ICAPS AREDS 2 PO) Take by mouth.    Marland Kitchen omeprazole (PRILOSEC) 20 MG capsule Take 1 capsule (20 mg total) by mouth daily. 30 capsule 0  . thiamine 100 MG tablet Take 1 tablet (100 mg total) by mouth daily. 30 tablet 2  . vitamin B-12 (CYANOCOBALAMIN) 1000 MCG tablet Take 1,000 mcg by mouth daily.    Marland Kitchen acetaminophen (TYLENOL) 325 MG tablet Take by mouth.    . capecitabine (XELODA) 500 MG tablet Take 3 tablets (1,500 mg total) by mouth 2 (two) times daily after a meal. Take on days 1-14. Repeat every 21 days. (Patient not taking: Reported on 08/30/2017) 84 tablet 1  . clobetasol  cream (TEMOVATE) 0.05 % Apply topically.    . fluticasone (FLONASE) 50 MCG/ACT nasal spray Place 2 sprays into both nostrils daily as needed for rhinitis.     . folic acid (FOLVITE) 1 MG tablet Take 1 tablet (1 mg total) by mouth daily. (Patient not taking: Reported on 01/01/2018) 30 tablet 2  . furosemide (LASIX) 20 MG tablet Take by mouth.    . hydrocortisone (ANUSOL-HC) 25 MG suppository Place 1 suppository (25 mg total) rectally 2 (two) times daily. (Patient not taking: Reported on 08/30/2017) 12 suppository 0  . Iron-Vitamin C (IRON 100/C PO) Take by mouth.    . lidocaine (XYLOCAINE) 5 % ointment   0  . montelukast (SINGULAIR) 10 MG tablet Take 10 mg by mouth at bedtime. Reported on 08/21/2015    . mupirocin ointment (BACTROBAN) 2 % Apply 1 application topically daily as needed.   0  . ondansetron (ZOFRAN) 8 MG tablet Take 1 tablet (8 mg total) by mouth every 8 (eight) hours as needed for nausea or vomiting. (Patient not taking: Reported on 08/30/2017) 20 tablet 1  . valsartan (DIOVAN) 80 MG tablet Take 80 mg by mouth daily.     No current facility-administered medications for this visit.     Review of Systems  Constitutional: Negative.  Negative for chills, diaphoresis, fever, malaise/fatigue and weight loss (up 2 pounds).       Feels "fine, I think".  HENT: Negative.  Negative for congestion, ear discharge, ear pain, nosebleeds, sinus pain, sore throat and tinnitus.   Eyes: Negative.  Negative for blurred vision, double vision, photophobia, pain and redness.  Respiratory: Negative.  Negative for cough, hemoptysis, sputum production, shortness of breath and wheezing.   Cardiovascular: Negative.  Negative for chest pain, palpitations, orthopnea, leg swelling and PND.  Gastrointestinal: Negative.  Negative for abdominal pain, blood in stool, constipation, diarrhea, heartburn, melena, nausea and vomiting.       Colostomy  Genitourinary: Negative.  Negative for dysuria, frequency, hematuria and  urgency.  Musculoskeletal: Negative.  Negative for back pain, falls, joint pain, myalgias and neck pain.  Skin: Negative.  Negative for itching and rash.  Neurological: Negative.  Negative for dizziness, tremors, sensory change, focal weakness, seizures, weakness and headaches.       Balance and coordination, improved.  Endo/Heme/Allergies: Negative.  Does not bruise/bleed easily.  Psychiatric/Behavioral: Positive for memory loss (dementia). Negative for depression. The patient is not nervous/anxious and does not have insomnia.   All other systems reviewed and are negative.  Physical Exam: Blood pressure (!) 155/82, pulse 60, temperature 98.3 F (36.8 C), temperature source Tympanic, resp. rate 18, weight 142 lb 11.2 oz (64.7 kg). GENERAL:  Well  developed, well nourished, elderly gentleman sitting comfortably in the exam room in no acute distress.  He has a cane at his side. MENTAL STATUS:  Alert and oriented to person, place and time. HEAD:  Pearline Cables hair.  Normocephalic, atraumatic, face symmetric, no Cushingoid features. EYES:  Glasses. Blue eyes.  Pupils equal round and reactive to light and accomodation.  No conjunctivitis or scleral icterus. ENT:  Oropharynx clear without lesion.  Tongue normal. Mucous membranes moist.  RESPIRATORY:  Clear to auscultation without rales, wheezes or rhonchi. CARDIOVASCULAR:  Regular rate and rhythm without murmur, rub or gallop. ABDOMEN:  Soft, non-tender, with active bowel sounds, and no hepatosplenomegaly.  No masses. Ostomy.  Stool liquid brown. SKIN:  No rashes, ulcers or lesions. EXTREMITIES: No edema, no skin discoloration or tenderness.  No palpable cords. LYMPH NODES: No palpable cervical, supraclavicular, axillary or inguinal adenopathy  NEUROLOGICAL: Unremarkable. PSYCH:  Appropriate.    Appointment on 01/01/2018  Component Date Value Ref Range Status  . Sodium 01/01/2018 133* 135 - 145 mmol/L Final  . Potassium 01/01/2018 4.4  3.5 - 5.1  mmol/L Final  . Chloride 01/01/2018 98  98 - 111 mmol/L Final  . CO2 01/01/2018 27  22 - 32 mmol/L Final  . Glucose, Bld 01/01/2018 114* 70 - 99 mg/dL Final  . BUN 01/01/2018 16  8 - 23 mg/dL Final  . Creatinine, Ser 01/01/2018 0.81  0.61 - 1.24 mg/dL Final  . Calcium 01/01/2018 10.3  8.9 - 10.3 mg/dL Final  . Total Protein 01/01/2018 7.4  6.5 - 8.1 g/dL Final  . Albumin 01/01/2018 4.3  3.5 - 5.0 g/dL Final  . AST 01/01/2018 36  15 - 41 U/L Final  . ALT 01/01/2018 31  0 - 44 U/L Final  . Alkaline Phosphatase 01/01/2018 65  38 - 126 U/L Final  . Total Bilirubin 01/01/2018 0.7  0.3 - 1.2 mg/dL Final  . GFR calc non Af Amer 01/01/2018 >60  >60 mL/min Final  . GFR calc Af Amer 01/01/2018 >60  >60 mL/min Final   Comment: (NOTE) The eGFR has been calculated using the CKD EPI equation. This calculation has not been validated in all clinical situations. eGFR's persistently <60 mL/min signify possible Chronic Kidney Disease.   Georgiann Hahn gap 01/01/2018 8  5 - 15 Final   Performed at [facility], [address] [place], [place] [address]  . WBC 01/01/2018 6.6  3.8 - 10.6 K/uL Final  . RBC 01/01/2018 4.53  4.40 - 5.90 MIL/uL Final  . Hemoglobin 01/01/2018 15.0  13.0 - 18.0 g/dL Final  . HCT 01/01/2018 44.0  40.0 - 52.0 % Final  . MCV 01/01/2018 97.1  80.0 - 100.0 fL Final  . MCH 01/01/2018 33.1  26.0 - 34.0 pg Final  . MCHC 01/01/2018 34.0  32.0 - 36.0 g/dL Final  . RDW 01/01/2018 14.4  11.5 - 14.5 % Final  . Platelets 01/01/2018 219  150 - 440 K/uL Final  . Neutrophils Relative % 01/01/2018 69  % Final  . Neutro Abs 01/01/2018 4.5  1.4 - 6.5 K/uL Final  . Lymphocytes Relative 01/01/2018 18  % Final  . Lymphs Abs 01/01/2018 1.2  1.0 - 3.6 K/uL Final  . Monocytes Relative 01/01/2018 9  % Final  . Monocytes Absolute 01/01/2018 0.6  0.2 - 1.0 K/uL Final  . Eosinophils Relative 01/01/2018 3  % Final  . Eosinophils Absolute 01/01/2018 0.2  0 - 0.7 K/uL Final  . Basophils Relative  01/01/2018  1  % Final  . Basophils Absolute 01/01/2018 0.1  0 - 0.1 K/uL Final   Performed at [facility], [address] [place], [place] [address]    Assessment:  Kenneth Schuh. is a 84 y.o. male with stage IIC (T4bN0M0) low rectal adenocarcinoma s/p neoadjuvant chemotherapy and radiation followed by surgery.  He presented with rectal bleeding.  Colonoscopy on 07/10/2015 revealed a 4 cm frond-like/villous ulcerated non-obstructing mass in the rectum.  Biopsy revealed adenocarcinoma.  Chest, abdomen, and pelvic CT scan on 07/27/2015 revealed a circumferential soft tissue mass within the low rectum. He was a T12 compression fracture.  CEA was 4.1 on 07/26/2015.  Flexible sigmoidoscopy and ultrasound on 08/19/2015 revealed a 3.0 x 1.4 cm hypoechoic non-circumferential mass was found in the rectum.  There was no sonographic evidence suggesting breakthrough of the muscularis propria with invasion into the perirectal fat.  No lymph nodes were seen in the perirectal region and in the left iliac region. Stage was early T3uN0.  He received 5 weeks of concurrent Xeloda and radiation (09/09/2015 - 10/22/2015).  He underwent robotic assisted APR with end colostomy at [facility] on 12/17/2015 at [facility].  Pathology revealed ypT4bN0M0 (stage IIC) invasive moderately differentiated adenocarcinoma.  Levator muscle was involved.  He received 6 cycles of adjuvant Xeloda (02/15/2016 -  06/06/2016).  Cycle #3 was truncated after 1 week secondary to palmar plantar erythrodysesthesia (hand foot syndrome).  Xeloda was dose reduced with cycle #4.  Chest, abdomen, and pelvic CT on 07/26/2016 revealed smooth uniform soft tissue thickening in the presacral space, compatible with posttreatment change.  There were no findings suspicious for local tumor recurrence in the pelvis.  There was no evidence of metastatic disease in the chest, abdomen or pelvis.  There was moderate superior L1 vertebral compression fracture of  indeterminate chronicity, new since 08/08/2015.   Abdomen and pelvic CT on 06/16/2017 revealed small bowel herniated through the superior margin of the left lower quadrant colostomy defect, accounting for the patient's swelling around his colostomy stoma. There was no evidence, however, a bowel obstruction, incarceration or strangulation. There was no evidence of locally recurrent or metastatic rectal carcinoma.  Chest CT on 08/11/2017 revealed no suspicious findings to suggest metastasis within the chest.    He underwent parastomal repair at [facility] on 07/11/2017.  CEA has been followed:  4.1 on 07/26/2015, 2.6 on 02/01/2016, 2.9 on 07/25/2016, 2.9 on 10/28/2016, 3.1 on 01/27/2017, 2.9 on 04/28/2017, and 4.3 on 08/30/2017.  Colonoscopy on 04/24/2017 revealed a 3 mm polpy in the ascending colon (tubular adenoma).  He had diverticulosis throughout the colon.  He was admitted to [facility] from 07/24/2015 - 07/30/2015 with acute right basal ganglia hemorrhage and left humeral head fracture s/p a fall on 07/21/2015.     He was admitted to [facility] in [place] from 08/08/2015 - 08/09/2015 with acute diverticulitis.  He was discharged home on Augmentin rather than Flagyl given his alcohol dependency.    He has iron deficiency anemia.  Anemia workup on 03/07/2016 revealed a ferritin of 13, iron saturation 5% and TIBC 469 (high).  B12 was 501. Folate was > 47.  He takes oral B12 daily.  B12 was 430 on 01/01/2018.  Diet is good.  Ferritin was 43 on 04/19/2016, 99 on 07/25/2016, 50 on 10/28/2016, 62 on 04/28/2017, and 45 on 01/01/2018.  He discontinued oral iron on 07/29/2016.    He drinks alcohol daily (6 bottles/week).  He has mild dementia.   Symptomatically, he  feels good.  Exam is unremarkable.  Hemoglobin is 15.  Ferritin is 45.  Plan: 1.   Labs today:  CBC with diff, CMP, CEA, ferritin, B12, folate. 2.   Stage IIC rectal carcinoma:  Clinically doing well with no evidence of disease. 3.   Intermittent  increased LFTs:  LFTs are normal today. 4.   Iron deficiency anemia:  Patient off oral iron > 1 year.  Last ferritin was 62 on 04/28/2017.  Check iron stores today. 5.   B12 deficiency:  Patient on oral B12.  Last B12 and folate level was in 02/2016.  Check B12 and folate level today. 6.  RTC in 4 months for MD assessment and labs (CBC with diff, CMP, CEA).   Lequita Asal, MD  01/01/2018, 4:01 PM

## 2018-01-02 LAB — CEA: CEA: 3.2 ng/mL (ref 0.0–4.7)

## 2018-04-25 DIAGNOSIS — K219 Gastro-esophageal reflux disease without esophagitis: Secondary | ICD-10-CM | POA: Insufficient documentation

## 2018-05-03 ENCOUNTER — Telehealth: Payer: Self-pay

## 2018-05-03 NOTE — Telephone Encounter (Signed)
Spoke with wife, Holley Raring, to remind of appt. In [address].

## 2018-05-07 ENCOUNTER — Encounter: Payer: Self-pay | Admitting: Hematology and Oncology

## 2018-05-07 ENCOUNTER — Inpatient Hospital Stay: Payer: Medicare Other | Attending: Hematology and Oncology

## 2018-05-07 ENCOUNTER — Ambulatory Visit: Payer: Medicare Other | Admitting: Hematology and Oncology

## 2018-05-07 ENCOUNTER — Inpatient Hospital Stay (HOSPITAL_BASED_OUTPATIENT_CLINIC_OR_DEPARTMENT_OTHER): Payer: Medicare Other | Admitting: Hematology and Oncology

## 2018-05-07 ENCOUNTER — Other Ambulatory Visit: Payer: Medicare Other

## 2018-05-07 VITALS — BP 166/74 | HR 59 | Temp 97.4°F | Resp 18 | Ht 65.0 in | Wt 142.2 lb

## 2018-05-07 DIAGNOSIS — C20 Malignant neoplasm of rectum: Secondary | ICD-10-CM

## 2018-05-07 DIAGNOSIS — R7989 Other specified abnormal findings of blood chemistry: Secondary | ICD-10-CM

## 2018-05-07 DIAGNOSIS — E538 Deficiency of other specified B group vitamins: Secondary | ICD-10-CM

## 2018-05-07 DIAGNOSIS — D509 Iron deficiency anemia, unspecified: Secondary | ICD-10-CM | POA: Diagnosis not present

## 2018-05-07 DIAGNOSIS — Z8042 Family history of malignant neoplasm of prostate: Secondary | ICD-10-CM

## 2018-05-07 DIAGNOSIS — Z87891 Personal history of nicotine dependence: Secondary | ICD-10-CM

## 2018-05-07 LAB — COMPREHENSIVE METABOLIC PANEL
ALT: 35 U/L (ref 0–44)
AST: 43 U/L — ABNORMAL HIGH (ref 15–41)
Albumin: 4.2 g/dL (ref 3.5–5.0)
Alkaline Phosphatase: 69 U/L (ref 38–126)
Anion gap: 9 (ref 5–15)
BUN: 17 mg/dL (ref 8–23)
CO2: 29 mmol/L (ref 22–32)
Calcium: 10.2 mg/dL (ref 8.9–10.3)
Chloride: 97 mmol/L — ABNORMAL LOW (ref 98–111)
Creatinine, Ser: 0.71 mg/dL (ref 0.61–1.24)
GFR calc Af Amer: >60 mL/min (ref >60)
GFR calc non Af Amer: >60 mL/min (ref >60)
Glucose, Bld: 104 mg/dL — ABNORMAL HIGH (ref 70–99)
Potassium: 4.6 mmol/L (ref 3.5–5.1)
Sodium: 135 mmol/L (ref 135–145)
Total Bilirubin: 0.5 mg/dL (ref 0.3–1.2)
Total Protein: 7.4 g/dL (ref 6.5–8.1)

## 2018-05-07 LAB — CBC WITH DIFFERENTIAL/PLATELET
Abs Immature Granulocytes: 0.02 10*3/uL (ref 0.00–0.07)
Basophils Absolute: 0.1 10*3/uL (ref 0.0–0.1)
Basophils Relative: 1 %
Eosinophils Absolute: 0.1 10*3/uL (ref 0.0–0.5)
Eosinophils Relative: 2 %
HCT: 44.2 % (ref 39.0–52.0)
Hemoglobin: 15.4 g/dL (ref 13.0–17.0)
Immature Granulocytes: 0 %
Lymphocytes Relative: 17 %
Lymphs Abs: 1 10*3/uL (ref 0.7–4.0)
MCH: 33.3 pg (ref 26.0–34.0)
MCHC: 34.8 g/dL (ref 30.0–36.0)
MCV: 95.7 fL (ref 80.0–100.0)
Monocytes Absolute: 0.7 10*3/uL (ref 0.1–1.0)
Monocytes Relative: 12 %
Neutro Abs: 3.7 10*3/uL (ref 1.7–7.7)
Neutrophils Relative %: 68 %
Platelets: 188 10*3/uL (ref 150–400)
RBC: 4.62 MIL/uL (ref 4.22–5.81)
RDW: 13.1 % (ref 11.5–15.5)
WBC: 5.5 10*3/uL (ref 4.0–10.5)
nRBC: 0 % (ref 0.0–0.2)

## 2018-05-07 NOTE — Progress Notes (Signed)
No new changes noted today 

## 2018-05-07 NOTE — Progress Notes (Signed)
[facility] day: 05/07/2018   Chief Complaint: Kenneth Lenis. is a 85 y.o. male with stage IIC rectal cancer who is seen for 4 month assessment.  HPI:  The patient was last seen in the medical oncology clinic on 01/01/2018.  At that time, patient was doing well.  He denied any acute concerns.  No abdominal pain, nausea, or vomiting.  He continued to consume alcohol on a daily basis.  Exam was stable.  Hemoglobin 15 and hematocrit 44.0.  Sodium low at 133 mmol/L. CEA stable at 3.2 ng/mL.  B12 normal at 430 pg/mL.  Folate normal at 47.0 ng/mL.  Ferritin 45 ng/mL.  Symptomatically, patient is doing well today. He denies any acute concerns. Patient feels generally well. He continues to enjoy daily drinks of alcohol. Patient denies any nausea, vomiting, or changes to his bowel habits. Patient has not experienced any abdominal pain. Patient denies that he has experienced any B symptoms. He denies any interval infections.   Mentation is at baseline. Patient retains the ability to independently complete all of his ADLs.   Patient advises that he maintains an adequate appetite. He is eating well. Weight today is 142 lb 3.2 oz (64.5 kg), which compared to his last visit to the clinic, represents a stable weight. Patient is no longer on oral iron supplementation.  He remains on the recommended B12 supplement daily.   Patient denies pain in the clinic today.   Past Medical History:  Diagnosis Date  . Allergic rhinitis   . Anemia   . B12 deficiency 06/29/2015  . Benign neoplasm of ascending colon   . Benign neoplasm of descending colon   . Colon polyp   . Colostomy in place [facility])   . DD (diverticular disease) 06/29/2015  . Dementia ([place])    memory issues  . Fatty liver   . GERD (gastroesophageal reflux disease)   . Glaucoma   . Hyperlipidemia   . Hypertension   . Rectal adenocarcinoma ([place]) 07/15/2015  . Substance abuse ([place])    alcohol  .  Traumatic intraparenchymal hemorrhage [facility])     Past Surgical History:  Procedure Laterality Date  . ABDOMINOPERINEAL PROCTOCOLECTOMY  12/17/2015   [facility]  . CATARACT EXTRACTION W/ INTRAOCULAR LENS IMPLANT    . COLONOSCOPY WITH PROPOFOL N/A 07/10/2015   Procedure: COLONOSCOPY WITH PROPOFOL;  Surgeon: Lucilla Lame, MD;  Location: [place];  Service: Endoscopy;  Laterality: N/A;  PER CAREGIVER WAS TOLD THAT PT WOULD BE 1ST (KEEP PT EARLY AM)  . COLONOSCOPY WITH PROPOFOL N/A 04/24/2017   Procedure: COLONOSCOPY WITH PROPOFOL;  Surgeon: Lucilla Lame, MD;  Location: [place];  Service: Endoscopy;  Laterality: N/A;  . POLYPECTOMY  07/10/2015   Procedure: POLYPECTOMY;  Surgeon: Lucilla Lame, MD;  Location: [place];  Service: Endoscopy;;    Family History  Problem Relation Age of Onset  . Cancer Father        Prostate    Social History:  reports that he quit smoking about 40 years ago. His smoking use included pipe. He has never used smokeless tobacco. He reports current alcohol use of about 28.0 standard drinks of alcohol per week. He reports that he does not use drugs.  He drinks 3 glasses of wine a day.  The patient is widowed.  He has no children.  The patient is accompanied by his caregiver, [name], today.  Allergies: No Known Allergies  Current Medications: Current Outpatient  Medications  Medication Sig Dispense Refill  . B Complex Vitamins (VITAMIN B COMPLEX 100 IJ) Take by mouth.    . bevacizumab (AVASTIN) 400 MG/16ML SOLN Administer into left eye once monthly x 3 months    . brimonidine-timolol (COMBIGAN) 0.2-0.5 % ophthalmic solution Place 1 drop into both eyes 2 (two) times daily.    . cetirizine (ZYRTEC) 10 MG tablet Take 10 mg by mouth daily.     . cholecalciferol (VITAMIN D) 1000 units tablet Take 1,000 Units by mouth daily.    Marland Kitchen docusate sodium (COLACE) 100 MG capsule Take 100 mg by mouth daily.    Marland Kitchen donepezil (ARICEPT) 10 MG tablet Take 10 mg  by mouth at bedtime.    . gabapentin (NEURONTIN) 100 MG capsule Take 100 mg by mouth 2 (two) times daily.     . Iron-Vitamin C (IRON 100/C PO) Take by mouth.    . montelukast (SINGULAIR) 10 MG tablet Take 10 mg by mouth at bedtime. Reported on 08/21/2015    . Multiple Vitamin (MULTIVITAMIN) tablet Take 1 tablet by mouth daily.    . Multiple Vitamins-Minerals (ICAPS AREDS 2 PO) Take by mouth.    Marland Kitchen omeprazole (PRILOSEC) 20 MG capsule Take 1 capsule (20 mg total) by mouth daily. 30 capsule 0  . thiamine 100 MG tablet Take 1 tablet (100 mg total) by mouth daily. 30 tablet 2  . valsartan (DIOVAN) 80 MG tablet Take 80 mg by mouth daily.    . vitamin B-12 (CYANOCOBALAMIN) 1000 MCG tablet Take 1,000 mcg by mouth daily.    Marland Kitchen acetaminophen (TYLENOL) 325 MG tablet Take by mouth.    . capecitabine (XELODA) 500 MG tablet Take 3 tablets (1,500 mg total) by mouth 2 (two) times daily after a meal. Take on days 1-14. Repeat every 21 days. (Patient not taking: Reported on 08/30/2017) 84 tablet 1  . clobetasol cream (TEMOVATE) 0.05 % Apply topically.    . fluticasone (FLONASE) 50 MCG/ACT nasal spray Place 2 sprays into both nostrils daily as needed for rhinitis.     . folic acid (FOLVITE) 1 MG tablet Take 1 tablet (1 mg total) by mouth daily. (Patient not taking: Reported on 01/01/2018) 30 tablet 2  . furosemide (LASIX) 20 MG tablet Take by mouth.    . hydrocortisone (ANUSOL-HC) 25 MG suppository Place 1 suppository (25 mg total) rectally 2 (two) times daily. (Patient not taking: Reported on 08/30/2017) 12 suppository 0  . lidocaine (XYLOCAINE) 5 % ointment   0  . mupirocin ointment (BACTROBAN) 2 % Apply 1 application topically daily as needed.   0  . ondansetron (ZOFRAN) 8 MG tablet Take 1 tablet (8 mg total) by mouth every 8 (eight) hours as needed for nausea or vomiting. (Patient not taking: Reported on 08/30/2017) 20 tablet 1   No current facility-administered medications for this visit.     Review of Systems   Constitutional: Negative.  Negative for chills, diaphoresis, fever, malaise/fatigue and weight loss (stable).       Feels "good".  HENT: Negative.  Negative for congestion, ear pain, nosebleeds, sore throat and tinnitus.   Eyes: Negative.  Negative for blurred vision, double vision, photophobia and pain.  Respiratory: Negative.  Negative for cough, hemoptysis, sputum production, shortness of breath and wheezing.   Cardiovascular: Negative.  Negative for palpitations, orthopnea, leg swelling and PND.  Gastrointestinal: Negative.  Negative for abdominal pain, blood in stool, constipation, diarrhea, heartburn, melena, nausea and vomiting.       Colostomy.  Genitourinary: Negative.  Negative for dysuria, frequency, hematuria and urgency.  Musculoskeletal: Negative.  Negative for back pain, falls, joint pain, myalgias and neck pain.  Skin: Negative.  Negative for itching and rash.  Neurological: Negative.  Negative for dizziness, tremors, sensory change, speech change, focal weakness, seizures, weakness and headaches.  Endo/Heme/Allergies: Negative.  Does not bruise/bleed easily.  Psychiatric/Behavioral: Positive for memory loss (dementia). Negative for depression. The patient is not nervous/anxious and does not have insomnia.   All other systems reviewed and are negative.  Physical Exam: Blood pressure (!) 166/74, pulse (!) 59, temperature (!) 97.4 F (36.3 C), temperature source Tympanic, resp. rate 18, height 5\' 5"  (1.651 m), weight 142 lb 3.2 oz (64.5 kg), SpO2 99 %. GENERAL:  Well developed, well nourished, elderly gentleman sitting comfortably in the exam room in no acute distress. MENTAL STATUS:  Alert and oriented to person, place and time. HEAD:  Pearline Cables hair.  Normocephalic, atraumatic, face symmetric, no Cushingoid features. EYES:  Glasses.  Blue eyes.  Pupils equal round and reactive to light and accomodation.  No conjunctivitis or scleral icterus. ENT:  Oropharynx clear without lesion.   Tongue normal. Mucous membranes moist.  RESPIRATORY:  Clear to auscultation without rales, wheezes or rhonchi. CARDIOVASCULAR:  Regular rate and rhythm without murmur, rub or gallop. ABDOMEN:  Soft, non-tender, with active bowel sounds, and no hepatosplenomegaly.  No masses.  Ostomy. SKIN:  No rashes, ulcers or lesions. EXTREMITIES: No edema, no skin discoloration or tenderness.  No palpable cords. LYMPH NODES: No palpable cervical, supraclavicular, axillary or inguinal adenopathy  NEUROLOGICAL: Unremarkable. PSYCH:  Appropriate.    Appointment on 05/07/2018  Component Date Value Ref Range Status  . Sodium 05/07/2018 135  135 - 145 mmol/L Final  . Potassium 05/07/2018 4.6  3.5 - 5.1 mmol/L Final  . Chloride 05/07/2018 97* 98 - 111 mmol/L Final  . CO2 05/07/2018 29  22 - 32 mmol/L Final  . Glucose, Bld 05/07/2018 104* 70 - 99 mg/dL Final  . BUN 05/07/2018 17  8 - 23 mg/dL Final  . Creatinine, Ser 05/07/2018 0.71  0.61 - 1.24 mg/dL Final  . Calcium 05/07/2018 10.2  8.9 - 10.3 mg/dL Final  . Total Protein 05/07/2018 7.4  6.5 - 8.1 g/dL Final  . Albumin 05/07/2018 4.2  3.5 - 5.0 g/dL Final  . AST 05/07/2018 43* 15 - 41 U/L Final  . ALT 05/07/2018 35  0 - 44 U/L Final  . Alkaline Phosphatase 05/07/2018 69  38 - 126 U/L Final  . Total Bilirubin 05/07/2018 0.5  0.3 - 1.2 mg/dL Final  . GFR calc non Af Amer 05/07/2018 >60  >60 mL/min Final  . GFR calc Af Amer 05/07/2018 >60  >60 mL/min Final  . Anion gap 05/07/2018 9  5 - 15 Final   Performed at [facility], [address], [place], [place] [address]  . WBC 05/07/2018 5.5  4.0 - 10.5 K/uL Final  . RBC 05/07/2018 4.62  4.22 - 5.81 MIL/uL Final  . Hemoglobin 05/07/2018 15.4  13.0 - 17.0 g/dL Final  . HCT 05/07/2018 44.2  39.0 - 52.0 % Final  . MCV 05/07/2018 95.7  80.0 - 100.0 fL Final  . MCH 05/07/2018 33.3  26.0 - 34.0 pg Final  . MCHC 05/07/2018 34.8  30.0 - 36.0 g/dL Final  . RDW 05/07/2018 13.1  11.5 - 15.5 % Final  .  Platelets 05/07/2018 188  150 - 400 K/uL Final  . nRBC 05/07/2018 0.0  0.0 - 0.2 % Final  . Neutrophils Relative % 05/07/2018 68  % Final  . Neutro Abs 05/07/2018 3.7  1.7 - 7.7 K/uL Final  . Lymphocytes Relative 05/07/2018 17  % Final  . Lymphs Abs 05/07/2018 1.0  0.7 - 4.0 K/uL Final  . Monocytes Relative 05/07/2018 12  % Final  . Monocytes Absolute 05/07/2018 0.7  0.1 - 1.0 K/uL Final  . Eosinophils Relative 05/07/2018 2  % Final  . Eosinophils Absolute 05/07/2018 0.1  0.0 - 0.5 K/uL Final  . Basophils Relative 05/07/2018 1  % Final  . Basophils Absolute 05/07/2018 0.1  0.0 - 0.1 K/uL Final  . Immature Granulocytes 05/07/2018 0  % Final  . Abs Immature Granulocytes 05/07/2018 0.02  0.00 - 0.07 K/uL Final   Performed at [facility], [address] [place], [place] [address]    Assessment:  Kenneth Maltos. is a 85 y.o. male with stage IIC (T4bN0M0) low rectal adenocarcinoma s/p neoadjuvant chemotherapy and radiation followed by surgery.  He presented with rectal bleeding.  Colonoscopy on 07/10/2015 revealed a 4 cm frond-like/villous ulcerated non-obstructing mass in the rectum.  Biopsy revealed adenocarcinoma.  Chest, abdomen, and pelvic CT scan on 07/27/2015 revealed a circumferential soft tissue mass within the low rectum. He was a T12 compression fracture.  CEA was 4.1 on 07/26/2015.  Flexible sigmoidoscopy and ultrasound on 08/19/2015 revealed a 3.0 x 1.4 cm hypoechoic non-circumferential mass was found in the rectum.  There was no sonographic evidence suggesting breakthrough of the muscularis propria with invasion into the perirectal fat.  No lymph nodes were seen in the perirectal region and in the left iliac region. Stage was early T3uN0.  He received 5 weeks of concurrent Xeloda and radiation (09/09/2015 - 10/22/2015).  He underwent robotic assisted APR with end colostomy [facility] on 12/17/2015 at [facility].  Pathology revealed ypT4bN0M0 (stage IIC) invasive moderately  differentiated adenocarcinoma.  Levator muscle was involved.  He received 6 cycles of adjuvant Xeloda (02/15/2016 -  06/06/2016).  Cycle #3 was truncated after 1 week secondary to palmar plantar erythrodysesthesia (hand foot syndrome).  Xeloda was dose reduced with cycle #4.  Chest, abdomen, and pelvic CT on 07/26/2016 revealed smooth uniform soft tissue thickening in the presacral space, compatible with posttreatment change.  There were no findings suspicious for local tumor recurrence in the pelvis.  There was no evidence of metastatic disease in the chest, abdomen or pelvis.  There was moderate superior L1 vertebral compression fracture of indeterminate chronicity, new since 08/08/2015.   Abdomen and pelvic CT on 06/16/2017 revealed small bowel herniated through the superior margin of the left lower quadrant colostomy defect, accounting for the patient's swelling around his colostomy stoma. There was no evidence, however, a bowel obstruction, incarceration or strangulation. There was no evidence of locally recurrent or metastatic rectal carcinoma.  Chest CT on 08/11/2017 revealed no suspicious findings to suggest metastasis within the chest.    He underwent parastomal repair at [facility] on 07/11/2017.  CEA has been followed:  4.1 on 07/26/2015, 2.6 on 02/01/2016, 2.9 on 07/25/2016, 2.9 on 10/28/2016, 3.1 on 01/27/2017, 2.9 on 04/28/2017, and 4.3 on 08/30/2017.  Colonoscopy on 04/24/2017 revealed a 3 mm polpy in the ascending colon (tubular adenoma).  He had diverticulosis throughout the colon.  He was admitted to [facility] from 07/24/2015 - 07/30/2015 with acute right basal ganglia hemorrhage and left humeral head fracture s/p a fall on 07/21/2015.     He was admitted to  [facility] in [place] from 08/08/2015 - 08/09/2015 with acute diverticulitis.  He was discharged home on Augmentin rather than Flagyl given his alcohol dependency.    He has iron deficiency anemia.  Anemia workup on 03/07/2016 revealed  a ferritin of 13, iron saturation 5% and TIBC 469 (high).  B12 was 501. Folate was > 47.  He takes oral B12 daily.  B12 was 430 on 01/01/2018.  Diet is good.  Ferritin was 43 on 04/19/2016, 99 on 07/25/2016, 50 on 10/28/2016, 62 on 04/28/2017, and 45 on 01/01/2018.  He discontinued oral iron on 07/29/2016.    He drinks alcohol daily (6 bottles/week).  He has mild dementia.   Symptomatically, he is doing well.  He denies any complaints.  Exam is stable.  Hemoglobin is 15.4.  Ferritin is 45.  Plan: 1.   Labs today:  CBC with diff, CMP, CEA, Ferritin, B12, folate. 2.   Stage IIC rectal carcinoma  He is clinically doing well.  No evidence of disease.  Discuss plan for surveillance imaging. 3.   Intermittent increased LFTs  AST 43.  ALT 35.  Continue surveillance. 4.   Iron deficiency anemia  Patient off off oral iron > 1 year.  Ferritin 45 today. 5.   B12 deficiency  Patient remains on oral B12.  B12 430 and folate 47 today. 6.   Schedule chest, abdomen, and pelvic CT on 08/13/2018. 7.   RTC in 6 months for MD assessment, labs (CBC with diff, CMP, CEA), and review of CT scans.   Honor Loh, NP  05/07/2018, 3:35 PM   I saw and evaluated the patient, participating in the key portions of the service and reviewing pertinent diagnostic studies and records.  I reviewed the nurse practitioner's note and agree with the findings and the plan.  The assessment and plan were discussed with the patient.  Several questions were asked by the patient and answered.   Nolon Stalls, MD 05/07/2018, 3:35 PM

## 2018-05-08 LAB — CEA: CEA: 3.1 ng/mL (ref 0.0–4.7)

## 2018-06-11 ENCOUNTER — Other Ambulatory Visit: Payer: Self-pay | Admitting: Family Medicine

## 2018-06-11 ENCOUNTER — Ambulatory Visit
Admission: RE | Admit: 2018-06-11 | Discharge: 2018-06-11 | Disposition: A | Discharge status: Home / Self Care | Payer: Medicare Other | Source: Ambulatory Visit | Attending: Family Medicine | Admitting: Family Medicine

## 2018-06-11 DIAGNOSIS — R232 Flushing: Secondary | ICD-10-CM | POA: Diagnosis present

## 2018-06-11 DIAGNOSIS — R2689 Other abnormalities of gait and mobility: Secondary | ICD-10-CM | POA: Insufficient documentation

## 2018-06-11 DIAGNOSIS — R4781 Slurred speech: Secondary | ICD-10-CM

## 2018-10-31 ENCOUNTER — Other Ambulatory Visit: Payer: Self-pay

## 2018-10-31 ENCOUNTER — Ambulatory Visit
Admission: RE | Admit: 2018-10-31 | Discharge: 2018-10-31 | Disposition: A | Discharge status: Home / Self Care | Payer: Medicare Other | Source: Ambulatory Visit | Attending: Urgent Care | Admitting: Urgent Care

## 2018-10-31 DIAGNOSIS — C20 Malignant neoplasm of rectum: Secondary | ICD-10-CM | POA: Insufficient documentation

## 2018-10-31 MED ORDER — IOHEXOL 300 MG/ML  SOLN
85.0000 mL | Freq: Once | INTRAMUSCULAR | Status: AC | PRN
  Administered 2018-10-31: 10:00:00 85 mL via INTRAVENOUS

## 2018-11-04 NOTE — Progress Notes (Signed)
[facility]  [address] [place], [place] [address] Phone: [phone]  Fax: [phone]   Clinic Day:  11/05/2018  Referring physician: Dion Body, MD  Chief Complaint: Kenneth Bees. is a 85 y.o. male with stage IIC rectal cancer who is seen for 6 month assessment.  HPI: The patient was last seen in the medical oncology clinic on 05/07/2018. At that time, he was doing well.  He denied any complaints.  Exam was stable. Hemoglobin was 15.4.  Ferritin was 45.  Chest, abdomen, pelvis CT on 10/31/2018 revealed unchanged postoperative findings status post abdominal perineal resection with left lower quadrant endostomy. There was a small parastomal hernia, status post repair. There was no evidence of metastatic disease in the chest, abdomen, or pelvis.  During the interim, he has been doing "pretty good." He denies any fevers, sweats, or weight loss. He denies any shortness of breath or cough. He denies any nausea, vomiting or diarrhea. He denies any blood in his stools or urine.   He has a sore on his left foot that is painful when he walks. He has slowly worsening macular degeneration in his right eye. He reports his balance issues are stable, and he uses a cane to ambulate. He denies any falls.   Weight is up 1lb today. He has not been able to exercise at the [facility] and has been doing exercises at home and walking. He continues to drink 3 glasses of wine per day.    Past Medical History:  Diagnosis Date  . Allergic rhinitis   . Anemia   . B12 deficiency 06/29/2015  . Benign neoplasm of ascending colon   . Benign neoplasm of descending colon   . Colon polyp   . Colostomy in place [facility])   . DD (diverticular disease) 06/29/2015  . Dementia ([place])    memory issues  . Fatty liver   . GERD (gastroesophageal reflux disease)   . Glaucoma   . Hyperlipidemia   . Hypertension   . Rectal adenocarcinoma ([place]) 07/15/2015  . Substance abuse  ([place])    alcohol  . Traumatic intraparenchymal hemorrhage [facility])     Past Surgical History:  Procedure Laterality Date  . ABDOMINOPERINEAL PROCTOCOLECTOMY  12/17/2015   [facility]  . CATARACT EXTRACTION W/ INTRAOCULAR LENS IMPLANT    . COLONOSCOPY WITH PROPOFOL N/A 07/10/2015   Procedure: COLONOSCOPY WITH PROPOFOL;  Surgeon: Lucilla Lame, MD;  Location: [place];  Service: Endoscopy;  Laterality: N/A;  PER CAREGIVER WAS TOLD THAT PT WOULD BE 1ST (KEEP PT EARLY AM)  . COLONOSCOPY WITH PROPOFOL N/A 04/24/2017   Procedure: COLONOSCOPY WITH PROPOFOL;  Surgeon: Lucilla Lame, MD;  Location: [place];  Service: Endoscopy;  Laterality: N/A;  . POLYPECTOMY  07/10/2015   Procedure: POLYPECTOMY;  Surgeon: Lucilla Lame, MD;  Location: [place];  Service: Endoscopy;;    Family History  Problem Relation Age of Onset  . Cancer Father        Prostate    Social History:  reports that he quit smoking about 40 years ago. His smoking use included pipe. He has never used smokeless tobacco. He reports current alcohol use of about 28.0 standard drinks of alcohol per week. He reports that he does not use drugs. He drinks 3 glasses of wine a day. The patient is widowed. He has no children. The patient is alone in the clinic today. Talked to his caregiver, [name], over the phone [phone]).  Allergies:  No Known Allergies  Current Medications: Current Outpatient Medications  Medication Sig Dispense Refill  . acetaminophen (TYLENOL) 325 MG tablet Take 325 mg by mouth every 4 (four) hours as needed.     Marland Kitchen aspirin EC 81 MG tablet Take 81 mg by mouth daily.     . B Complex Vitamins (VITAMIN B COMPLEX 100 IJ) Take 1 tablet by mouth daily.     . brimonidine-timolol (COMBIGAN) 0.2-0.5 % ophthalmic solution Place 1 drop into both eyes 2 (two) times daily.    . cetirizine (ZYRTEC) 10 MG tablet Take 10 mg by mouth daily.     . cholecalciferol (VITAMIN D) 1000 units tablet Take 1,000  Units by mouth daily.    Marland Kitchen docusate sodium (COLACE) 100 MG capsule Take 100 mg by mouth daily.    Marland Kitchen donepezil (ARICEPT) 10 MG tablet Take 10 mg by mouth at bedtime.    . fluticasone (FLONASE) 50 MCG/ACT nasal spray Place 2 sprays into both nostrils daily as needed for rhinitis.     Marland Kitchen gabapentin (NEURONTIN) 100 MG capsule Take 100 mg by mouth 2 (two) times daily.     Marland Kitchen losartan (COZAAR) 25 MG tablet Take by mouth.    . Multiple Vitamin (MULTIVITAMIN) tablet Take 1 tablet by mouth daily.    . Multiple Vitamins-Minerals (ICAPS AREDS 2 PO) Take by mouth.    . capecitabine (XELODA) 500 MG tablet Take 3 tablets (1,500 mg total) by mouth 2 (two) times daily after a meal. Take on days 1-14. Repeat every 21 days. (Patient not taking: Reported on 08/30/2017) 84 tablet 1  . folic acid (FOLVITE) 1 MG tablet Take 1 tablet (1 mg total) by mouth daily. (Patient not taking: Reported on 01/01/2018) 30 tablet 2  . Iron-Vitamin C (IRON 100/C PO) Take by mouth.    . montelukast (SINGULAIR) 10 MG tablet Take 10 mg by mouth at bedtime. Reported on 08/21/2015    . omeprazole (PRILOSEC) 20 MG capsule Take 1 capsule (20 mg total) by mouth daily. 30 capsule 0  . thiamine 100 MG tablet Take 1 tablet (100 mg total) by mouth daily. 30 tablet 2  . valsartan (DIOVAN) 80 MG tablet Take 80 mg by mouth daily.    . vitamin B-12 (CYANOCOBALAMIN) 1000 MCG tablet Take 1,000 mcg by mouth daily.     No current facility-administered medications for this visit.     Review of Systems  Constitutional: Negative.  Negative for chills, diaphoresis, fever, malaise/fatigue and weight loss (up 1 pound).       Doing "pretty good."  HENT: Positive for hearing loss. Negative for congestion, ear discharge, ear pain, nosebleeds, sinus pain, sore throat and tinnitus.   Eyes: Positive for blurred vision (macular degeneration, right eye). Negative for double vision, photophobia, pain and redness.  Respiratory: Negative.  Negative for cough, hemoptysis,  sputum production, shortness of breath and wheezing.   Cardiovascular: Negative.  Negative for chest pain, palpitations, orthopnea, leg swelling and PND.  Gastrointestinal: Negative.  Negative for abdominal pain, blood in stool, constipation, diarrhea, heartburn, melena, nausea and vomiting.       Colostomy  Genitourinary: Negative.  Negative for dysuria, frequency, hematuria and urgency.  Musculoskeletal: Negative.  Negative for back pain, falls, joint pain, myalgias and neck pain.  Skin: Negative.  Negative for itching and rash.  Neurological: Negative.  Negative for dizziness, tremors, sensory change, speech change, focal weakness, seizures, weakness and headaches.       Balance and coordination, stable.  Endo/Heme/Allergies:  Negative.  Does not bruise/bleed easily.  Psychiatric/Behavioral: Positive for memory loss (dementia). Negative for depression. The patient is not nervous/anxious and does not have insomnia.   All other systems reviewed and are negative.  Performance status (ECOG): 1  Vitals Blood pressure (!) 149/58, pulse 60, temperature 97.7 F (36.5 C), temperature source Tympanic, resp. rate 16, weight 143 lb 11.8 oz (65.2 kg), SpO2 100 %.   Physical Exam  Constitutional: He is oriented to person, place, and time. He appears well-developed and well-nourished. No distress.  HENT:  Head: Normocephalic and atraumatic.  Mouth/Throat: Oropharynx is clear and moist. No oropharyngeal exudate.  Gray hair.  Mask.  Eyes: Pupils are equal, round, and reactive to light. Conjunctivae and EOM are normal. No scleral icterus.  Glasses.  Blue eyes.   Neck: Normal range of motion. Neck supple. No JVD present.  Cardiovascular: Normal rate, regular rhythm and normal heart sounds.  No murmur heard. Pulmonary/Chest: Effort normal and breath sounds normal. No respiratory distress. He has no wheezes. He has no rales.  Abdominal: Soft. Bowel sounds are normal. He exhibits no distension and no  mass. There is no abdominal tenderness. There is no rebound and no guarding.  Ostomy.  Musculoskeletal: Normal range of motion.        General: No edema.  Lymphadenopathy:    He has no cervical adenopathy.    He has no axillary adenopathy.       Right: No supraclavicular adenopathy present.       Left: No supraclavicular adenopathy present.  Neurological: He is alert and oriented to person, place, and time. Gait (uses cane to ambulate) abnormal.  Skin: Skin is warm and dry. Lesion (left foot callused) noted. He is not diaphoretic. No erythema.  Psychiatric: He has a normal mood and affect. His behavior is normal. Judgment and thought content normal.  Nursing note and vitals reviewed.   Imaging studies: 07/27/2015:  Chest, abdomen, and pelvis CT on 07/27/2015 revealed a circumferential soft tissue mass within the low rectum. There was a T12 compression fracture.   07/26/2016:  Chest, abdomen, and pelvis CT on 07/26/2016 revealed smooth uniform soft tissue thickening in the presacral space, compatible with posttreatment change.  There were no findings suspicious for local tumor recurrence in the pelvis.  There was no evidence of metastatic disease in the chest, abdomen or pelvis.  There was moderate superior L1 vertebral compression fracture of indeterminate chronicity, new since 08/08/2015.  06/16/2017:  Abdomen and pelvis CT revealed small bowel herniated through the superior margin of the left lower quadrant colostomy defect, accounting for the patient's swelling around his colostomy stoma. There was no evidence, however, a bowel obstruction, incarceration or strangulation. There was no evidence of locally recurrent or metastatic rectal carcinoma.   08/11/2017:  Chest CT revealed no suspicious findings to suggest metastasis within the chest.   10/31/2018:  Chest, abdomen, pelvis CT revealed unchanged postoperative findings status post abdominal perineal resection with left lower quadrant  endostomy. There was a small parastomal hernia, status post repair. There was no evidence of metastatic disease in the chest, abdomen, or pelvis.   Admissions: [facility] from 07/24/2015 - 07/30/2015 with acute right basal ganglia hemorrhage and left humeral head fracture s/p a fall on 07/21/2015.     [facility] in [place] from 08/08/2015 - 08/09/2015 with acute diverticulitis.  He was discharged home on Augmentin rather than Flagyl given his alcohol dependency.    Appointment on 11/05/2018  Component Date Value Ref Range Status  .  Sodium 11/05/2018 132* 135 - 145 mmol/L Final  . Potassium 11/05/2018 4.2  3.5 - 5.1 mmol/L Final  . Chloride 11/05/2018 98  98 - 111 mmol/L Final  . CO2 11/05/2018 25  22 - 32 mmol/L Final  . Glucose, Bld 11/05/2018 106* 70 - 99 mg/dL Final  . BUN 11/05/2018 17  8 - 23 mg/dL Final  . Creatinine, Ser 11/05/2018 0.72  0.61 - 1.24 mg/dL Final  . Calcium 11/05/2018 9.9  8.9 - 10.3 mg/dL Final  . Total Protein 11/05/2018 7.4  6.5 - 8.1 g/dL Final  . Albumin 11/05/2018 4.2  3.5 - 5.0 g/dL Final  . AST 11/05/2018 33  15 - 41 U/L Final  . ALT 11/05/2018 28  0 - 44 U/L Final  . Alkaline Phosphatase 11/05/2018 68  38 - 126 U/L Final  . Total Bilirubin 11/05/2018 0.6  0.3 - 1.2 mg/dL Final  . GFR calc non Af Amer 11/05/2018 >60  >60 mL/min Final  . GFR calc Af Amer 11/05/2018 >60  >60 mL/min Final  . Anion gap 11/05/2018 9  5 - 15 Final   Performed at [facility], [address] [place], [place] [address]  . WBC 11/05/2018 5.6  4.0 - 10.5 K/uL Final  . RBC 11/05/2018 4.59  4.22 - 5.81 MIL/uL Final  . Hemoglobin 11/05/2018 14.7  13.0 - 17.0 g/dL Final  . HCT 11/05/2018 42.9  39.0 - 52.0 % Final  . MCV 11/05/2018 93.5  80.0 - 100.0 fL Final  . MCH 11/05/2018 32.0  26.0 - 34.0 pg Final  . MCHC 11/05/2018 34.3  30.0 - 36.0 g/dL Final  . RDW 11/05/2018 13.1  11.5 - 15.5 % Final  . Platelets 11/05/2018 224  150 - 400 K/uL Final  . nRBC 11/05/2018 0.0  0.0  - 0.2 % Final  . Neutrophils Relative % 11/05/2018 67  % Final  . Neutro Abs 11/05/2018 3.8  1.7 - 7.7 K/uL Final  . Lymphocytes Relative 11/05/2018 19  % Final  . Lymphs Abs 11/05/2018 1.1  0.7 - 4.0 K/uL Final  . Monocytes Relative 11/05/2018 9  % Final  . Monocytes Absolute 11/05/2018 0.5  0.1 - 1.0 K/uL Final  . Eosinophils Relative 11/05/2018 4  % Final  . Eosinophils Absolute 11/05/2018 0.2  0.0 - 0.5 K/uL Final  . Basophils Relative 11/05/2018 1  % Final  . Basophils Absolute 11/05/2018 0.1  0.0 - 0.1 K/uL Final  . Immature Granulocytes 11/05/2018 0  % Final  . Abs Immature Granulocytes 11/05/2018 0.01  0.00 - 0.07 K/uL Final   Performed at [facility], [address], [place], [place] [address]    Assessment:  Kenneth Luna. is a 85 y.o. male with stage IIC (T4bN0M0) low rectal adenocarcinoma s/p neoadjuvant chemotherapy and radiation followed by surgery.  He presented with rectal bleeding.  Colonoscopy on 07/10/2015 revealed a 4 cm frond-like/villous ulcerated non-obstructing mass in the rectum.  Biopsy revealed adenocarcinoma.  Chest, abdomen, and pelvic CT scan on 07/27/2015 revealed a circumferential soft tissue mass within the low rectum. There was a T12 compression fracture.  CEA was 4.1 on 07/26/2015.  Flexible sigmoidoscopy and ultrasound on 08/19/2015 revealed a 3.0 x 1.4 cm hypoechoic non-circumferential mass was found in the rectum.  There was no sonographic evidence suggesting breakthrough of the muscularis propria with invasion into the perirectal fat.  No lymph nodes were seen in the perirectal region and in the left iliac region.  Stage was early T3uN0.  He received 5 weeks of concurrent Xeloda and radiation (09/09/2015 - 10/22/2015).  He underwent robotic assisted APR with end colostomy at [facility] on 12/17/2015 at [facility].  Pathology revealed [id number] (stage IIC) invasive moderately differentiated adenocarcinoma.  Levator muscle was involved.  He  received 6 cycles of adjuvant Xeloda (02/15/2016 -  06/06/2016).  Cycle #3 was truncated after 1 week secondary to palmar plantar erythrodysesthesia (hand foot syndrome).  Xeloda was dose reduced with cycle #4.  He underwent parastomal repair at [facility] on 07/11/2017.  Chest, abdomen, pelvis CT on 10/31/2018 revealed no evidence of metastatic disease in the chest, abdomen, or pelvis.  CEA has been followed:  4.1 on 07/26/2015, 2.6 on 02/01/2016, 2.9 on 07/25/2016, 2.9 on 10/28/2016, 3.1 on 01/27/2017, 2.9 on 04/28/2017, 4.3 on 08/30/2017, 3.2 on 01/01/2018, 3.1 on 05/07/2018, and 3.3 on 11/05/2018.   Colonoscopy on 04/24/2017 revealed a 3 mm polpy in the ascending colon (tubular adenoma).  He had diverticulosis throughout the colon.  He has iron deficiency anemia.  Anemia workup on 03/07/2016 revealed a ferritin of 13, iron saturation 5% and TIBC 469 (high).  B12 was 501. Folate was > 47.  He takes oral B12 daily.  B12 was 430 on 01/01/2018.  Diet is good.  Ferritin was 43 on 04/19/2016, 99 on 07/25/2016, 50 on 10/28/2016, 62 on 04/28/2017, 45 on 01/01/2018, and 33 on 11/05/2018.  He discontinued oral iron on 07/29/2016.    He drinks alcohol daily (6 bottles/week).  He has mild dementia.   Symptomatically, he is doing well.  He denies any GI symptoms.  Exam is unremarkable.  He has a callus on the dorsum of his foot.  Plan: 1.   Labs today:   CBC with diff, CMP, CEA, ferritin, B12, folate. 2.   Stage IIC rectal carcinoma             Clinically, he is doing well.    Exam reveals no evidence of recurrent disease.  Review CT scans with patient from 10/31/2018.  Scans personally reviewed.  No evidence of recurrent disease.    CEA is normal.  Continue surveillance. 3.   Intermittent increased LFTs             AST 33.  ALT 28.             Continue surveillance. 4.   Iron deficiency anemia             Ferritin is 33.    He remains off oral iron.    Continue to monitor. 5.   B12 deficiency              B12 is 537 today.  Folate 60.  Continue oral B12. 6.   Left foot callus  Follow up with podiatry. 7.   RTC in 6 months for MD assessment and labs (CBC with differential, CMP, CEA).  I discussed the assessment and treatment plan with the patient.  The patient was provided an opportunity to ask questions and all were answered.  The patient agreed with the plan and demonstrated an understanding of the instructions.  The patient was advised to call back if the symptoms worsen or if the condition fails to improve as anticipated.  I provided 15 minutes of face-to-face time during this this encounter and > 50% was spent counseling as documented under my assessment and plan.    Lequita Asal, MD, PhD    11/05/2018, 3:53 PM  I, Cloyde Reams Dorshimer,  am acting as Education administrator for [employer] C. Mike Gip, MD, PhD.  I, Melissa C. Mike Gip, MD, have reviewed the above documentation for accuracy and completeness, and I agree with the above.

## 2018-11-05 ENCOUNTER — Other Ambulatory Visit: Payer: Medicare Other

## 2018-11-05 ENCOUNTER — Ambulatory Visit: Payer: Medicare Other | Admitting: Hematology and Oncology

## 2018-11-05 ENCOUNTER — Encounter: Payer: Self-pay | Admitting: Hematology and Oncology

## 2018-11-05 ENCOUNTER — Other Ambulatory Visit: Payer: Self-pay

## 2018-11-05 ENCOUNTER — Inpatient Hospital Stay: Payer: Medicare Other | Attending: Hematology and Oncology

## 2018-11-05 ENCOUNTER — Inpatient Hospital Stay (HOSPITAL_BASED_OUTPATIENT_CLINIC_OR_DEPARTMENT_OTHER): Payer: Medicare Other | Admitting: Hematology and Oncology

## 2018-11-05 ENCOUNTER — Other Ambulatory Visit: Payer: Self-pay | Admitting: Hematology and Oncology

## 2018-11-05 VITALS — BP 149/58 | HR 60 | Temp 97.7°F | Resp 16 | Wt 143.7 lb

## 2018-11-05 DIAGNOSIS — D509 Iron deficiency anemia, unspecified: Secondary | ICD-10-CM | POA: Diagnosis not present

## 2018-11-05 DIAGNOSIS — Z87891 Personal history of nicotine dependence: Secondary | ICD-10-CM | POA: Insufficient documentation

## 2018-11-05 DIAGNOSIS — F0390 Unspecified dementia without behavioral disturbance: Secondary | ICD-10-CM | POA: Diagnosis not present

## 2018-11-05 DIAGNOSIS — E538 Deficiency of other specified B group vitamins: Secondary | ICD-10-CM

## 2018-11-05 DIAGNOSIS — C20 Malignant neoplasm of rectum: Secondary | ICD-10-CM | POA: Diagnosis not present

## 2018-11-05 DIAGNOSIS — L84 Corns and callosities: Secondary | ICD-10-CM | POA: Diagnosis not present

## 2018-11-05 DIAGNOSIS — R7989 Other specified abnormal findings of blood chemistry: Secondary | ICD-10-CM | POA: Insufficient documentation

## 2018-11-05 DIAGNOSIS — Z7289 Other problems related to lifestyle: Secondary | ICD-10-CM

## 2018-11-05 LAB — CBC WITH DIFFERENTIAL/PLATELET
Abs Immature Granulocytes: 0.01 10*3/uL (ref 0.00–0.07)
Basophils Absolute: 0.1 10*3/uL (ref 0.0–0.1)
Basophils Relative: 1 %
Eosinophils Absolute: 0.2 10*3/uL (ref 0.0–0.5)
Eosinophils Relative: 4 %
HCT: 42.9 % (ref 39.0–52.0)
Hemoglobin: 14.7 g/dL (ref 13.0–17.0)
Immature Granulocytes: 0 %
Lymphocytes Relative: 19 %
Lymphs Abs: 1.1 10*3/uL (ref 0.7–4.0)
MCH: 32 pg (ref 26.0–34.0)
MCHC: 34.3 g/dL (ref 30.0–36.0)
MCV: 93.5 fL (ref 80.0–100.0)
Monocytes Absolute: 0.5 10*3/uL (ref 0.1–1.0)
Monocytes Relative: 9 %
Neutro Abs: 3.8 10*3/uL (ref 1.7–7.7)
Neutrophils Relative %: 67 %
Platelets: 224 10*3/uL (ref 150–400)
RBC: 4.59 MIL/uL (ref 4.22–5.81)
RDW: 13.1 % (ref 11.5–15.5)
WBC: 5.6 10*3/uL (ref 4.0–10.5)
nRBC: 0 % (ref 0.0–0.2)

## 2018-11-05 LAB — FERRITIN: Ferritin: 33 ng/mL (ref 24–336)

## 2018-11-05 LAB — COMPREHENSIVE METABOLIC PANEL
ALT: 28 U/L (ref 0–44)
AST: 33 U/L (ref 15–41)
Albumin: 4.2 g/dL (ref 3.5–5.0)
Alkaline Phosphatase: 68 U/L (ref 38–126)
Anion gap: 9 (ref 5–15)
BUN: 17 mg/dL (ref 8–23)
CO2: 25 mmol/L (ref 22–32)
Calcium: 9.9 mg/dL (ref 8.9–10.3)
Chloride: 98 mmol/L (ref 98–111)
Creatinine, Ser: 0.72 mg/dL (ref 0.61–1.24)
GFR calc Af Amer: >60 mL/min (ref >60)
GFR calc non Af Amer: >60 mL/min (ref >60)
Glucose, Bld: 106 mg/dL — ABNORMAL HIGH (ref 70–99)
Potassium: 4.2 mmol/L (ref 3.5–5.1)
Sodium: 132 mmol/L — ABNORMAL LOW (ref 135–145)
Total Bilirubin: 0.6 mg/dL (ref 0.3–1.2)
Total Protein: 7.4 g/dL (ref 6.5–8.1)

## 2018-11-05 LAB — VITAMIN B12: Vitamin B-12: 537 pg/mL (ref 180–914)

## 2018-11-05 LAB — FOLATE: Folate: 60 ng/mL (ref >5.9)

## 2018-11-05 NOTE — Progress Notes (Signed)
Pt here for follow up. Patient complaining of a "wart like" area on the bottom of left foot x 1 month. States it has been causing him some pain when he walks.

## 2018-11-06 LAB — CEA: CEA: 3.3 ng/mL (ref 0.0–4.7)

## 2019-04-30 ENCOUNTER — Encounter: Payer: Self-pay | Admitting: Hematology and Oncology

## 2019-05-03 ENCOUNTER — Other Ambulatory Visit: Payer: Medicare Other

## 2019-05-03 ENCOUNTER — Other Ambulatory Visit: Payer: Self-pay

## 2019-05-03 ENCOUNTER — Inpatient Hospital Stay: Payer: Medicare Other | Attending: Radiation Oncology

## 2019-05-03 DIAGNOSIS — Z85048 Personal history of other malignant neoplasm of rectum, rectosigmoid junction, and anus: Secondary | ICD-10-CM | POA: Insufficient documentation

## 2019-05-03 DIAGNOSIS — C20 Malignant neoplasm of rectum: Secondary | ICD-10-CM

## 2019-05-03 LAB — CBC WITH DIFFERENTIAL/PLATELET
Abs Immature Granulocytes: 0.01 10*3/uL (ref 0.00–0.07)
Basophils Absolute: 0.1 10*3/uL (ref 0.0–0.1)
Basophils Relative: 1 %
Eosinophils Absolute: 0.2 10*3/uL (ref 0.0–0.5)
Eosinophils Relative: 4 %
HCT: 42.7 % (ref 39.0–52.0)
Hemoglobin: 14.1 g/dL (ref 13.0–17.0)
Immature Granulocytes: 0 %
Lymphocytes Relative: 24 %
Lymphs Abs: 1 10*3/uL (ref 0.7–4.0)
MCH: 30.8 pg (ref 26.0–34.0)
MCHC: 33 g/dL (ref 30.0–36.0)
MCV: 93.2 fL (ref 80.0–100.0)
Monocytes Absolute: 0.4 10*3/uL (ref 0.1–1.0)
Monocytes Relative: 10 %
Neutro Abs: 2.6 10*3/uL (ref 1.7–7.7)
Neutrophils Relative %: 61 %
Platelets: 251 10*3/uL (ref 150–400)
RBC: 4.58 MIL/uL (ref 4.22–5.81)
RDW: 13 % (ref 11.5–15.5)
WBC: 4.3 10*3/uL (ref 4.0–10.5)
nRBC: 0 % (ref 0.0–0.2)

## 2019-05-03 LAB — COMPREHENSIVE METABOLIC PANEL
ALT: 26 U/L (ref 0–44)
AST: 32 U/L (ref 15–41)
Albumin: 4 g/dL (ref 3.5–5.0)
Alkaline Phosphatase: 66 U/L (ref 38–126)
Anion gap: 8 (ref 5–15)
BUN: 15 mg/dL (ref 8–23)
CO2: 27 mmol/L (ref 22–32)
Calcium: 10 mg/dL (ref 8.9–10.3)
Chloride: 100 mmol/L (ref 98–111)
Creatinine, Ser: 0.54 mg/dL — ABNORMAL LOW (ref 0.61–1.24)
GFR calc Af Amer: >60 mL/min (ref >60)
GFR calc non Af Amer: >60 mL/min (ref >60)
Glucose, Bld: 112 mg/dL — ABNORMAL HIGH (ref 70–99)
Potassium: 4.4 mmol/L (ref 3.5–5.1)
Sodium: 135 mmol/L (ref 135–145)
Total Bilirubin: 0.8 mg/dL (ref 0.3–1.2)
Total Protein: 7 g/dL (ref 6.5–8.1)

## 2019-05-04 LAB — CEA: CEA: 3 ng/mL (ref 0.0–4.7)

## 2019-05-05 NOTE — Progress Notes (Signed)
[facility]  [address], [address] [place], [place] [address] Phone: [phone]  Fax: [phone]   Telephone Office Visit:  05/06/2019  Referring physician: Dion Body, MD  I connected with [name] on 05/06/19 at 11:52 AM by telephone and verified that I was speaking with the correct person using 2 identifiers.  The patient was at home.  I discussed the limitations, risk, security and privacy concerns of performing an evaluation and management service by telephone and the availability of in person appointments.  I also discussed with the patient that there may be a patient responsible charge related to this service.  The patient expressed understanding and agreed to proceed.   Chief Complaint: Kenneth Luna. is a 86 y.o. male with stage IIC rectal cancer  who is seen for 6 month assessment   HPI: The patient was last seen in the medical oncology clinic on 11/05/2018. At that time, he was doing well.  He denied any GI symptoms.  Exam was unremarkable.  Hemoglobin was 14.7.  Ferritin was 33.  LFTs were normal.  CEA was 3.3.  Labs on 05/03/2019 revealed a hematocrit 42.7, hemoglobin 14.1, MCV 93.2, platelets 251,000, WBC 4300, ANC 2600.  LFTs were normal. CEA was 3.0.   During the interim, he has been doing fine. He hasn't noticed any changes. He tries to walk 2-3 times a week and some simple exercises. His weight has been stable.   Symptomatically, he voices no new concerns. He now receives injection in both of his eyes due to his macular degeneration. He continues to take his oral B-12 with no oral iron.  His memory has remained unchanged.   Past Medical History:  Diagnosis Date  . Allergic rhinitis   . Anemia   . B12 deficiency 06/29/2015  . Benign neoplasm of ascending colon   . Benign neoplasm of descending colon   . Colon polyp   . Colostomy in place [facility])   . DD (diverticular disease) 06/29/2015  . Dementia ([place])     memory issues  . Fatty liver   . GERD (gastroesophageal reflux disease)   . Glaucoma   . Hyperlipidemia   . Hypertension   . Rectal adenocarcinoma ([place]) 07/15/2015  . Substance abuse ([place])    alcohol  . Traumatic intraparenchymal hemorrhage [facility])     Past Surgical History:  Procedure Laterality Date  . ABDOMINOPERINEAL PROCTOCOLECTOMY  12/17/2015   [facility]  . CATARACT EXTRACTION W/ INTRAOCULAR LENS IMPLANT    . COLONOSCOPY WITH PROPOFOL N/A 07/10/2015   Procedure: COLONOSCOPY WITH PROPOFOL;  Surgeon: Lucilla Lame, MD;  Location: [place];  Service: Endoscopy;  Laterality: N/A;  PER CAREGIVER WAS TOLD THAT PT WOULD BE 1ST (KEEP PT EARLY AM)  . COLONOSCOPY WITH PROPOFOL N/A 04/24/2017   Procedure: COLONOSCOPY WITH PROPOFOL;  Surgeon: Lucilla Lame, MD;  Location: [place];  Service: Endoscopy;  Laterality: N/A;  . POLYPECTOMY  07/10/2015   Procedure: POLYPECTOMY;  Surgeon: Lucilla Lame, MD;  Location: [place];  Service: Endoscopy;;    Family History  Problem Relation Age of Onset  . Cancer Father        Prostate    Social History:  reports that he quit smoking about 41 years ago. His smoking use included pipe. He has never used smokeless tobacco. He reports current alcohol use of about 84.0 standard drinks of alcohol per week. He reports that he does not use drugs. drinks 3 glasses of  wine a day. The patient is widowed. He has no children. He drink 6 bottles of wine a week. His caregiver, [name], can be reached at [phone]). The patient is accompanied by his caregiver today.  Participants in the patient's visit and their role in the encounter included the patient, [name], scribe, and [name], Therapist, sports.  The intake visit was provided by [name], scribe, Waymon Budge, RN.  Allergies: No Known Allergies  Current Medications: Current Outpatient Medications  Medication Sig Dispense Refill  . acetaminophen (TYLENOL) 325 MG  tablet Take 325 mg by mouth every 4 (four) hours as needed.     Marland Kitchen aspirin EC 81 MG tablet Take 81 mg by mouth daily.     . B Complex Vitamins (VITAMIN B COMPLEX 100 IJ) Take 1 tablet by mouth daily.     . brimonidine-timolol (COMBIGAN) 0.2-0.5 % ophthalmic solution Place 1 drop into both eyes 2 (two) times daily.    . cetirizine (ZYRTEC) 10 MG tablet Take 10 mg by mouth daily.     . cholecalciferol (VITAMIN D) 1000 units tablet Take 1,000 Units by mouth daily.    Marland Kitchen docusate sodium (COLACE) 100 MG capsule Take 100 mg by mouth daily.    Marland Kitchen donepezil (ARICEPT) 10 MG tablet Take 10 mg by mouth at bedtime.    . fluticasone (FLONASE) 50 MCG/ACT nasal spray Place 2 sprays into both nostrils daily as needed for rhinitis.     . folic acid (FOLVITE) 1 MG tablet Take 1 tablet (1 mg total) by mouth daily. 30 tablet 2  . gabapentin (NEURONTIN) 100 MG capsule Take 100 mg by mouth 2 (two) times daily.     Marland Kitchen losartan (COZAAR) 25 MG tablet Take 25 mg by mouth daily.     . montelukast (SINGULAIR) 10 MG tablet Take 10 mg by mouth at bedtime. Reported on 08/21/2015    . Multiple Vitamin (MULTIVITAMIN) tablet Take 1 tablet by mouth daily.    . Multiple Vitamins-Minerals (ICAPS AREDS 2 PO) Take by mouth.    Marland Kitchen omeprazole (PRILOSEC) 20 MG capsule Take 1 capsule (20 mg total) by mouth daily. 30 capsule 0  . capecitabine (XELODA) 500 MG tablet Take 3 tablets (1,500 mg total) by mouth 2 (two) times daily after a meal. Take on days 1-14. Repeat every 21 days. (Patient not taking: Reported on 08/30/2017) 84 tablet 1   No current facility-administered medications for this visit.    Review of Systems  Constitutional: Negative.  Negative for chills, diaphoresis, fever, malaise/fatigue and weight loss (stable).       Feels "great".  HENT: Positive for hearing loss. Negative for congestion, ear discharge, ear pain, nosebleeds, sinus pain and sore throat.   Eyes: Positive for blurred vision (macular degeneration, right eye).  Negative for double vision, photophobia, pain and redness.       Receiving injections.  Respiratory: Negative.  Negative for cough, hemoptysis, sputum production, shortness of breath and wheezing.   Cardiovascular: Negative.  Negative for chest pain, palpitations, orthopnea, leg swelling and PND.  Gastrointestinal: Negative.  Negative for abdominal pain, blood in stool, constipation, diarrhea, heartburn, melena, nausea and vomiting.       Colostomy.  Genitourinary: Negative.  Negative for dysuria, frequency, hematuria and urgency.  Musculoskeletal: Negative.  Negative for back pain, falls, joint pain, myalgias and neck pain.  Skin: Negative.  Negative for itching and rash.  Neurological: Negative.  Negative for dizziness, tremors, sensory change, speech change, focal weakness, seizures, weakness and  headaches.       Balance and coordination, stable.  Endo/Heme/Allergies: Negative.  Does not bruise/bleed easily.  Psychiatric/Behavioral: Positive for memory loss (dementia). Negative for depression. The patient is not nervous/anxious and does not have insomnia.   All other systems reviewed and are negative.  Performance status (ECOG):  1  Vitals There were no vitals taken for this visit.   Physical Exam  Constitutional: He is oriented to person, place, and time.  Neurological: He is alert and oriented to person, place, and time. He has normal reflexes.  Psychiatric: He has a normal mood and affect. His behavior is normal. Judgment and thought content normal.  Nursing note reviewed.    Imaging studies: 07/27/2015:  Chest, abdomen, and pelvis CTon 07/27/2015 revealed a circumferential soft tissue mass within the low rectum. There was a T12 compression fracture.  07/26/2016:  Chest, abdomen, and pelvis CTon 07/26/2016 revealed smooth uniform soft tissue thickening in the presacral space, compatible with posttreatment change. There were no findings suspicious for local tumor recurrence in  the pelvis. There was no evidence of metastatic disease in the chest, abdomen or pelvis. There was moderate superior L1 vertebral compression fracture of indeterminate chronicity, new since 08/08/2015.  06/16/2017:  Abdomen and pelvis CTrevealed small bowel herniated through the superior margin of the left lower quadrant colostomy defect, accounting for the patient's swelling around his colostomy stoma. There was no evidence, however, a bowel obstruction, incarceration or strangulation. There was no evidence of locally recurrent or metastatic rectal carcinoma.  08/11/2017:  Chest CTrevealed no suspicious findings to suggest metastasis within the chest.  10/31/2018:  Chest, abdomen, pelvis CT revealed unchanged postoperative findings status post abdominal perineal resection with left lower quadrant endostomy. There was a small parastomal hernia, status post repair. There was no evidence of metastatic disease in the chest, abdomen, or pelvis.  [facility] ac[facility]ight basal ganglia hemorrhageandleft humeral head fractures/p a fall on 07/21/2015.  [facility]. He was discharged home on Augmentin rather than Flagyl given his alcohol dependency.    No visits with results within 3 Day(s) from this visit.  Latest known visit with results is:  Appointment on 05/03/2019  Component Date Value Ref Range Status  . CEA 05/03/2019 3.0  0.0 - 4.7 ng/mL Final   Comment: (NOTE)                             Nonsmokers          <3.9                             Smokers             <5.6 [employer] Electrochemiluminescence Immunoassay (ECLIA) Values obtained with different assay methods or kits cannot be used interchangeably.  Results cannot be interpreted as absolute evidence of the presence or absence of malignant disease. Performed At: [facility] [place],  [place] [id number] Rush Farmer MD [id number]   . Sodium 05/03/2019 135  135 - 145 mmol/L Final  . Potassium 05/03/2019 4.4  3.5 - 5.1 mmol/L Final  . Chloride 05/03/2019 100  98 - 111 mmol/L Final  . CO2 05/03/2019 27  22 - 32 mmol/L Final  . Glucose, Bld 05/03/2019 112* 70 - 99 mg/dL Final  . BUN 05/03/2019 15  8 - 23  mg/dL Final  . Creatinine, Ser 05/03/2019 0.54* 0.61 - 1.24 mg/dL Final  . Calcium 05/03/2019 10.0  8.9 - 10.3 mg/dL Final  . Total Protein 05/03/2019 7.0  6.5 - 8.1 g/dL Final  . Albumin 05/03/2019 4.0  3.5 - 5.0 g/dL Final  . AST 05/03/2019 32  15 - 41 U/L Final  . ALT 05/03/2019 26  0 - 44 U/L Final  . Alkaline Phosphatase 05/03/2019 66  38 - 126 U/L Final  . Total Bilirubin 05/03/2019 0.8  0.3 - 1.2 mg/dL Final  . GFR calc non Af Amer 05/03/2019 >60  >60 mL/min Final  . GFR calc Af Amer 05/03/2019 >60  >60 mL/min Final  . Anion gap 05/03/2019 8  5 - 15 Final   Performed at [facility], [address] [place], [place] [address]  . WBC 05/03/2019 4.3  4.0 - 10.5 K/uL Final  . RBC 05/03/2019 4.58  4.22 - 5.81 MIL/uL Final  . Hemoglobin 05/03/2019 14.1  13.0 - 17.0 g/dL Final  . HCT 05/03/2019 42.7  39.0 - 52.0 % Final  . MCV 05/03/2019 93.2  80.0 - 100.0 fL Final  . MCH 05/03/2019 30.8  26.0 - 34.0 pg Final  . MCHC 05/03/2019 33.0  30.0 - 36.0 g/dL Final  . RDW 05/03/2019 13.0  11.5 - 15.5 % Final  . Platelets 05/03/2019 251  150 - 400 K/uL Final  . nRBC 05/03/2019 0.0  0.0 - 0.2 % Final  . Neutrophils Relative % 05/03/2019 61  % Final  . Neutro Abs 05/03/2019 2.6  1.7 - 7.7 K/uL Final  . Lymphocytes Relative 05/03/2019 24  % Final  . Lymphs Abs 05/03/2019 1.0  0.7 - 4.0 K/uL Final  . Monocytes Relative 05/03/2019 10  % Final  . Monocytes Absolute 05/03/2019 0.4  0.1 - 1.0 K/uL Final  . Eosinophils Relative 05/03/2019 4  % Final  . Eosinophils Absolute 05/03/2019 0.2  0.0 - 0.5 K/uL Final  . Basophils Relative 05/03/2019 1  % Final  . Basophils  Absolute 05/03/2019 0.1  0.0 - 0.1 K/uL Final  . Immature Granulocytes 05/03/2019 0  % Final  . Abs Immature Granulocytes 05/03/2019 0.01  0.00 - 0.07 K/uL Final   Performed at [facility], [address] [place], [place] [address]    Assessment:  Alberto Vanderkamp. is a 86 y.o. male with stage IIC (T4bN0M0) low rectal adenocarcinoma s/p neoadjuvant chemotherapyand radiationfollowed by surgery. He presented with rectal bleeding. Colonoscopy on 07/10/2015 revealed a 4 cm frond-like/villous ulcerated non-obstructing mass in the rectum. Biopsy revealed adenocarcinoma.  Chest, abdomen, and pelvic CTscan on 07/27/2015 revealed a circumferential soft tissue mass within the low rectum. There was a T12 compression fracture. CEAwas 4.1 on 07/26/2015.  Flexible sigmoidoscopyand ultrasound on 08/19/2015 revealed a 3.0 x 1.4 cm hypoechoic non-circumferential mass was found in the rectum. There was no sonographic evidence suggesting breakthrough of the muscularis propria with invasion into the perirectal fat. No lymph nodes were seen in the perirectal region and in the left iliac region. Stage was early T3uN0.  He received 5 weeks of concurrent Xeloda and radiation(09/09/2015 - 10/22/2015). He underwent robotic assisted APRwith end colostomy [facility] on 12/17/2015 at [facility]. Pathology revealed ypT4bN0M0 (stage IIC) invasive moderately differentiated adenocarcinoma. Levator muscle was involved.  He received 6 cycles of adjuvant Xeloda(02/15/2016 - 06/06/2016). Cycle #3 was truncated after 1 week secondary to palmar plantar erythrodysesthesia (hand foot syndrome). Xeloda was dose reduced with cycle #4.  [facility]  Chest, abdomen, pelvis CT on 10/31/2018 revealed no evidence of metastatic disease in the chest, abdomen, or pelvis.  CEAhas been followed: 4.1 on 07/26/2015, 2.6 on 02/01/2016, 2.9 on 07/25/2016, 2.9 on 10/28/2016, 3.1  on 01/27/2017, 2.9 on 04/28/2017, 4.3 on 08/30/2017, 3.2 on 01/01/2018, 3.1 on 05/07/2018, 3.3 on 11/05/2018, and 3.0 on 05/03/2019.   Colonoscopyon 04/24/2017 revealed a 3 mm polpy in the ascending colon (tubular adenoma). He had diverticulosis throughout the colon.  He has iron deficiencyanemia. Anemia workup on 03/07/2016 revealed a ferritin of 13, iron saturation 5% and TIBC 469 (high). B12 was 501. Folate was >47. He takes oral B12 daily. B12 was 430 on 01/01/2018. Dietis good. Ferritin was 43 on 04/19/2016, 99 on 07/25/2016, 50 on 10/28/2016, 62 on 04/28/2017, 45 on 01/01/2018, and 33 on 11/05/2018. He discontinued oral iron on 07/29/2016.   He drinksalcohol daily(6 bottles/week). He has mild dementia.   Symptomatically, he is doing well.  He voices no concerns.  Plan: 1.    Review labs from 05/03/2019 2. Stage IIC rectal carcinoma Clinically, he appears to be doing well.               Exam on 07/202/2020 revealed no evidence of recurrent disease.             Chest, abdomen, and pelvis CT on 10/31/2018 revealed no evidence of recurrent disease.               CEA is 3.0 (normal).             Discuss plans for yearly CT scans.  Continue surveillance. 3. Intermittent increased LFTs AST 32. ALT 26. Continue surveillance. 4. Iron deficiency anemia Ferritin was 33 on 11/05/2018.               He remains off oral iron.               Continue to monitor. 5. B12 deficiency B12 was 537 and folate 60 on 11/05/2018.             Continue oral B12.  Check folate annually. 6.   Schedule chest, abdomen, and pelvis CT on 10/31/2019. 7.   RTC after CT scan for MD assessment, labs (CBC with diff, CMP, ferritin, CEA- can be drawn before CT), and review of imaging.  I discussed the assessment and treatment plan with the patient.  The patient was provided an opportunity to ask questions and all were answered.   The patient agreed with the plan and demonstrated an understanding of the instructions.  The patient was advised to call back if the symptoms worsen or if the condition fails to improve as anticipated.  I provided 26 minutes (11:40 AM - 12:06 PM) of non face-to-face time during this this encounter and > 50% was spent counseling as documented under my assessment and plan.    Lequita Asal, MD, PhD    05/06/2019, 12:06 PM  I, [name], am acting as a scribe for Lequita Asal, MD.  I, [place] Mike Gip, MD, have reviewed the above documentation for accuracy and completeness, and I agree with the above.

## 2019-05-06 ENCOUNTER — Other Ambulatory Visit: Payer: Medicare Other

## 2019-05-06 ENCOUNTER — Encounter: Payer: Self-pay | Admitting: Hematology and Oncology

## 2019-05-06 ENCOUNTER — Other Ambulatory Visit: Payer: Self-pay

## 2019-05-06 ENCOUNTER — Inpatient Hospital Stay: Payer: Medicare Other

## 2019-05-06 ENCOUNTER — Inpatient Hospital Stay (HOSPITAL_BASED_OUTPATIENT_CLINIC_OR_DEPARTMENT_OTHER): Payer: Medicare Other | Admitting: Hematology and Oncology

## 2019-05-06 DIAGNOSIS — E538 Deficiency of other specified B group vitamins: Secondary | ICD-10-CM | POA: Diagnosis not present

## 2019-05-06 DIAGNOSIS — D509 Iron deficiency anemia, unspecified: Secondary | ICD-10-CM

## 2019-05-06 DIAGNOSIS — C20 Malignant neoplasm of rectum: Secondary | ICD-10-CM

## 2019-05-06 NOTE — Progress Notes (Signed)
Confirmed Name and DOB. Denies any concerns. Assessment completed with assistance from caregiver, Holley Raring.

## 2019-05-16 ENCOUNTER — Ambulatory Visit: Payer: Medicare Other

## 2019-05-24 ENCOUNTER — Ambulatory Visit: Payer: Medicare Other

## 2019-05-27 ENCOUNTER — Ambulatory Visit: Payer: Medicare Other

## 2019-05-30 ENCOUNTER — Ambulatory Visit: Payer: Medicare Other

## 2019-10-28 ENCOUNTER — Other Ambulatory Visit: Payer: Self-pay

## 2019-10-28 ENCOUNTER — Inpatient Hospital Stay: Payer: Medicare Other | Attending: Radiation Oncology

## 2019-10-28 DIAGNOSIS — Z8042 Family history of malignant neoplasm of prostate: Secondary | ICD-10-CM | POA: Insufficient documentation

## 2019-10-28 DIAGNOSIS — Z85048 Personal history of other malignant neoplasm of rectum, rectosigmoid junction, and anus: Secondary | ICD-10-CM | POA: Diagnosis present

## 2019-10-28 DIAGNOSIS — Z933 Colostomy status: Secondary | ICD-10-CM | POA: Diagnosis not present

## 2019-10-28 DIAGNOSIS — E538 Deficiency of other specified B group vitamins: Secondary | ICD-10-CM | POA: Insufficient documentation

## 2019-10-28 DIAGNOSIS — F0390 Unspecified dementia without behavioral disturbance: Secondary | ICD-10-CM | POA: Diagnosis not present

## 2019-10-28 DIAGNOSIS — Z87891 Personal history of nicotine dependence: Secondary | ICD-10-CM | POA: Diagnosis not present

## 2019-10-28 DIAGNOSIS — Z86010 Personal history of colonic polyps: Secondary | ICD-10-CM | POA: Insufficient documentation

## 2019-10-28 DIAGNOSIS — C20 Malignant neoplasm of rectum: Secondary | ICD-10-CM

## 2019-10-28 DIAGNOSIS — Z7289 Other problems related to lifestyle: Secondary | ICD-10-CM | POA: Insufficient documentation

## 2019-10-28 DIAGNOSIS — K469 Unspecified abdominal hernia without obstruction or gangrene: Secondary | ICD-10-CM | POA: Insufficient documentation

## 2019-10-28 DIAGNOSIS — D509 Iron deficiency anemia, unspecified: Secondary | ICD-10-CM | POA: Insufficient documentation

## 2019-10-28 DIAGNOSIS — H3530 Unspecified macular degeneration: Secondary | ICD-10-CM | POA: Insufficient documentation

## 2019-10-28 LAB — CBC WITH DIFFERENTIAL/PLATELET
Abs Immature Granulocytes: 0.01 10*3/uL (ref 0.00–0.07)
Basophils Absolute: 0.1 10*3/uL (ref 0.0–0.1)
Basophils Relative: 1 %
Eosinophils Absolute: 0.2 10*3/uL (ref 0.0–0.5)
Eosinophils Relative: 3 %
HCT: 39.6 % (ref 39.0–52.0)
Hemoglobin: 13.8 g/dL (ref 13.0–17.0)
Immature Granulocytes: 0 %
Lymphocytes Relative: 23 %
Lymphs Abs: 1.2 10*3/uL (ref 0.7–4.0)
MCH: 31.2 pg (ref 26.0–34.0)
MCHC: 34.8 g/dL (ref 30.0–36.0)
MCV: 89.4 fL (ref 80.0–100.0)
Monocytes Absolute: 0.4 10*3/uL (ref 0.1–1.0)
Monocytes Relative: 8 %
Neutro Abs: 3.2 10*3/uL (ref 1.7–7.7)
Neutrophils Relative %: 65 %
Platelets: 210 10*3/uL (ref 150–400)
RBC: 4.43 MIL/uL (ref 4.22–5.81)
RDW: 13.4 % (ref 11.5–15.5)
WBC: 5 10*3/uL (ref 4.0–10.5)
nRBC: 0 % (ref 0.0–0.2)

## 2019-10-28 LAB — COMPREHENSIVE METABOLIC PANEL
ALT: 24 U/L (ref 0–44)
AST: 31 U/L (ref 15–41)
Albumin: 3.9 g/dL (ref 3.5–5.0)
Alkaline Phosphatase: 61 U/L (ref 38–126)
Anion gap: 8 (ref 5–15)
BUN: 14 mg/dL (ref 8–23)
CO2: 28 mmol/L (ref 22–32)
Calcium: 9.7 mg/dL (ref 8.9–10.3)
Chloride: 96 mmol/L — ABNORMAL LOW (ref 98–111)
Creatinine, Ser: 0.7 mg/dL (ref 0.61–1.24)
GFR calc Af Amer: >60 mL/min (ref >60)
GFR calc non Af Amer: >60 mL/min (ref >60)
Glucose, Bld: 119 mg/dL — ABNORMAL HIGH (ref 70–99)
Potassium: 4.8 mmol/L (ref 3.5–5.1)
Sodium: 132 mmol/L — ABNORMAL LOW (ref 135–145)
Total Bilirubin: 0.8 mg/dL (ref 0.3–1.2)
Total Protein: 6.8 g/dL (ref 6.5–8.1)

## 2019-10-28 LAB — FERRITIN: Ferritin: 39 ng/mL (ref 24–336)

## 2019-10-29 ENCOUNTER — Telehealth: Payer: Self-pay

## 2019-10-29 ENCOUNTER — Other Ambulatory Visit: Payer: Medicare Other

## 2019-10-29 LAB — CEA: CEA: 3 ng/mL (ref 0.0–4.7)

## 2019-10-29 NOTE — Telephone Encounter (Signed)
Labs results has been routed to the patient PCP office.

## 2019-10-31 ENCOUNTER — Other Ambulatory Visit: Payer: Self-pay

## 2019-10-31 ENCOUNTER — Ambulatory Visit
Admission: RE | Admit: 2019-10-31 | Discharge: 2019-10-31 | Disposition: A | Discharge status: Home / Self Care | Payer: Medicare Other | Source: Ambulatory Visit | Attending: Hematology and Oncology | Admitting: Hematology and Oncology

## 2019-10-31 DIAGNOSIS — C20 Malignant neoplasm of rectum: Secondary | ICD-10-CM | POA: Insufficient documentation

## 2019-10-31 MED ORDER — IOHEXOL 300 MG/ML  SOLN
85.0000 mL | Freq: Once | INTRAMUSCULAR | Status: AC | PRN
  Administered 2019-10-31: 85 mL via INTRAVENOUS

## 2019-11-01 ENCOUNTER — Telehealth: Payer: Self-pay

## 2019-11-01 NOTE — Telephone Encounter (Signed)
Scans has been routed to the patient PCP office.

## 2019-11-03 ENCOUNTER — Encounter: Payer: Self-pay | Admitting: Hematology and Oncology

## 2019-11-03 NOTE — Progress Notes (Signed)
[facility]  [address], [address], [place] [address] Phone: [phone]  Fax: [phone]   Office visit:  11/04/2019  Referring physician: Dion Body, MD  Chief Complaint: [name] is a 86 y.o. male with stage IIC rectal cancer  who is seen for 6 month assessment.   HPI: The patient was last seen in the medical oncology clinic on 04/26/2019 for a telemedicine visit. At that time, he was doing well.  He voiced no concerns. We discussed a follow up chest, abdomen and pelvis CT for 10/31/2019. He continued on oral B12.   Chest, abdomen and pelvis CT on 10/31/2019 revealed no findings of recurrent malignancy. Left lower quadrant peristomal hernia had enlarged and contained loops of small bowel without complicating feature. Other imaging findings of potential clinical significance: coronary atherosclerosis, small type 1 hiatal hernia, cholelithiasis, APR with left colostomy, stable 50% anterior compression fracture at L1 and 40% compression at T12, stable grade 1 anterolisthesis at L4-5 and L5-S1 with associated rib right foraminal impingement at both levels.  Labs followed: 05/03/2019: Hematocrit 42.7, hemoglobin 14.1, platelets 251,000, WBC 4,300. Creatinine 0.54. CEA 3.0.  10/28/2019: Hematocrit 39.6, hemoglobin 13.8, platelets 210,000, WBC 5,000. Ferritin 39. Sodium 132. CEA 3.0.   During the interim, he was doing fine. He is taking oral vitamin B complex. He is off oral iron. He has no bone or joint pain. He denies any GI symptoms. His weight is down 4 pounds but he is eating well. I encouraged him to push fluids due to a sodium of 132.   Patient had blurred vision secondary to macular degeneration of the right eye. He continues to receive eye injections and has a follow up in 2 weeks. He has no balance or coordination issues. He continues to use a cane. The caregiver feels like his memory is stable.   Physical exam revealed a  hernia. The caregiver would like for him to be referred to a surgeon. The patient reported that he received a call yesterday regarding surgery assessment next month.    Past Medical History:  Diagnosis Date  . Allergic rhinitis   . Anemia   . B12 deficiency 06/29/2015  . Benign neoplasm of ascending colon   . Benign neoplasm of descending colon   . Colon polyp   . Colostomy in place [facility])   . DD (diverticular disease) 06/29/2015  . Dementia ([place])    memory issues  . Fatty liver   . GERD (gastroesophageal reflux disease)   . Glaucoma   . Hyperlipidemia   . Hypertension   . Rectal adenocarcinoma ([place]) 07/15/2015  . Substance abuse ([place])    alcohol  . Traumatic intraparenchymal hemorrhage [facility])     Past Surgical History:  Procedure Laterality Date  . ABDOMINOPERINEAL PROCTOCOLECTOMY  12/17/2015   [facility]  . CATARACT EXTRACTION W/ INTRAOCULAR LENS IMPLANT    . COLONOSCOPY WITH PROPOFOL N/A 07/10/2015   Procedure: COLONOSCOPY WITH PROPOFOL;  Surgeon: Lucilla Lame, MD;  Location: [place];  Service: Endoscopy;  Laterality: N/A;  PER CAREGIVER WAS TOLD THAT PT WOULD BE 1ST (KEEP PT EARLY AM)  . COLONOSCOPY WITH PROPOFOL N/A 04/24/2017   Procedure: COLONOSCOPY WITH PROPOFOL;  Surgeon: Lucilla Lame, MD;  Location: [place];  Service: Endoscopy;  Laterality: N/A;  . POLYPECTOMY  07/10/2015   Procedure: POLYPECTOMY;  Surgeon: Lucilla Lame, MD;  Location: [place];  Service: Endoscopy;;    Family History  Problem Relation Age of Onset  .  Cancer Father        Prostate    Social History:  reports that he quit smoking about 41 years ago. His smoking use included pipe. He has never used smokeless tobacco. He reports current alcohol use of about 28.0 standard drinks of alcohol per week. He reports that he does not use drugs. drinks 3 glasses of wine a day. The patient is widowed. He has no children. He drink 6 bottles of wine a week. His caregiver, [name], can  be reached at [phone]). The patient is accompanied by his caregiver on the iPad today.   Allergies: No Known Allergies  Current Medications: Current Outpatient Medications  Medication Sig Dispense Refill  . acetaminophen (TYLENOL) 325 MG tablet Take 325 mg by mouth every 4 (four) hours as needed.     Marland Kitchen aspirin EC 81 MG tablet Take 81 mg by mouth daily.     . B Complex Vitamins (VITAMIN B COMPLEX 100 IJ) Take 1 tablet by mouth daily.     . brimonidine-timolol (COMBIGAN) 0.2-0.5 % ophthalmic solution Place 1 drop into both eyes 2 (two) times daily.    . capecitabine (XELODA) 500 MG tablet Take 3 tablets (1,500 mg total) by mouth 2 (two) times daily after a meal. Take on days 1-14. Repeat every 21 days. 84 tablet 1  . cetirizine (ZYRTEC) 10 MG tablet Take 10 mg by mouth daily.     . cholecalciferol (VITAMIN D) 1000 units tablet Take 1,000 Units by mouth daily.    Marland Kitchen docusate sodium (COLACE) 100 MG capsule Take 100 mg by mouth daily.    Marland Kitchen donepezil (ARICEPT) 10 MG tablet Take 10 mg by mouth at bedtime.    . fluticasone (FLONASE) 50 MCG/ACT nasal spray Place 2 sprays into both nostrils daily as needed for rhinitis.     . folic acid (FOLVITE) 1 MG tablet Take 1 tablet (1 mg total) by mouth daily. 30 tablet 2  . gabapentin (NEURONTIN) 100 MG capsule Take 100 mg by mouth 2 (two) times daily.     Marland Kitchen losartan (COZAAR) 25 MG tablet Take 25 mg by mouth daily.     . montelukast (SINGULAIR) 10 MG tablet Take 10 mg by mouth at bedtime. Reported on 08/21/2015    . Multiple Vitamin (MULTIVITAMIN) tablet Take 1 tablet by mouth daily.    . Multiple Vitamins-Minerals (ICAPS AREDS 2 PO) Take by mouth.    Marland Kitchen omeprazole (PRILOSEC) 20 MG capsule Take 1 capsule (20 mg total) by mouth daily. 30 capsule 0   No current facility-administered medications for this visit.    Review of Systems  Constitutional: Positive for weight loss (4 lbs). Negative for chills, diaphoresis, fever and malaise/fatigue.       Doing  "fine".  HENT: Positive for hearing loss. Negative for congestion, ear discharge, ear pain, nosebleeds, sinus pain and sore throat.   Eyes: Positive for blurred vision (macular degeneration, right eye). Negative for double vision, photophobia, pain and redness.       Receiving injections.  Respiratory: Negative.  Negative for cough, hemoptysis, sputum production, shortness of breath and wheezing.   Cardiovascular: Negative.  Negative for chest pain, palpitations, orthopnea, leg swelling and PND.  Gastrointestinal: Negative.  Negative for abdominal pain, blood in stool, constipation, diarrhea, heartburn, melena, nausea and vomiting.       Colostomy. Eating well.  Genitourinary: Negative.  Negative for dysuria, frequency, hematuria and urgency.  Musculoskeletal: Negative.  Negative for back pain, falls, joint pain, myalgias and  neck pain.  Skin: Negative.  Negative for itching and rash.  Neurological: Negative.  Negative for dizziness, tremors, sensory change, speech change, focal weakness, seizures, weakness and headaches.       Balance and coordination, stable. Ambulates with cane.  Endo/Heme/Allergies: Negative.  Does not bruise/bleed easily.  Psychiatric/Behavioral: Positive for memory loss (dementia). Negative for depression. The patient is not nervous/anxious and does not have insomnia.   All other systems reviewed and are negative.  Performance status (ECOG):  1  Vitals Blood pressure 107/61, pulse 65, temperature (!) 97 F (36.1 C), temperature source Tympanic, weight 139 lb 8.8 oz (63.3 kg), SpO2 100 %.   Physical Exam Vitals and nursing note reviewed.  Constitutional:      General: He is not in acute distress.    Appearance: Normal appearance.     Interventions: Face mask in place.  HENT:     Head: Normocephalic and atraumatic.     Comments: Short gray hair.    Mouth/Throat:     Mouth: Mucous membranes are moist.     Pharynx: Oropharynx is clear.  Eyes:     General: No  scleral icterus.    Extraocular Movements: Extraocular movements intact.     Conjunctiva/sclera: Conjunctivae normal.     Pupils: Pupils are equal, round, and reactive to light.     Comments: Glasses.  Blue eyes.  Cardiovascular:     Rate and Rhythm: Normal rate and regular rhythm.     Pulses: Normal pulses.     Heart sounds: Normal heart sounds. No murmur heard.   Pulmonary:     Effort: Pulmonary effort is normal. No respiratory distress.     Breath sounds: Normal breath sounds. No wheezing or rales.  Chest:     Chest wall: No tenderness.  Abdominal:     General: Bowel sounds are normal.     Palpations: Abdomen is soft. There is no mass.     Tenderness: There is no abdominal tenderness. There is no guarding.     Hernia: A hernia (peristomal) is present.  Musculoskeletal:        General: No swelling or tenderness. Normal range of motion.     Cervical back: Normal range of motion and neck supple.  Lymphadenopathy:     Head:     Right side of head: No preauricular, posterior auricular or occipital adenopathy.     Left side of head: No preauricular, posterior auricular or occipital adenopathy.     Cervical: No cervical adenopathy.     Upper Body:     Right upper body: No supraclavicular or axillary adenopathy.     Left upper body: No supraclavicular or axillary adenopathy.     Lower Body: No right inguinal adenopathy. No left inguinal adenopathy.  Skin:    General: Skin is warm and dry.  Neurological:     Mental Status: He is alert and oriented to person, place, and time. Mental status is at baseline.     Deep Tendon Reflexes: Reflexes are normal and symmetric.  Psychiatric:        Mood and Affect: Mood normal.        Behavior: Behavior normal.        Thought Content: Thought content normal.        Judgment: Judgment normal.     Imaging studies: 07/27/2015:  Chest, abdomen, and pelvis CTon 07/27/2015 revealed a circumferential soft tissue mass within the low rectum. There  was a T12 compression fracture.  07/26/2016:  Chest, abdomen, and pelvis CTon 07/26/2016 revealed smooth uniform soft tissue thickening in the presacral space, compatible with posttreatment change. There were no findings suspicious for local tumor recurrence in the pelvis. There was no evidence of metastatic disease in the chest, abdomen or pelvis. There was moderate superior L1 vertebral compression fracture of indeterminate chronicity, new since 08/08/2015.  06/16/2017:  Abdomen and pelvis CTrevealed small bowel herniated through the superior margin of the left lower quadrant colostomy defect, accounting for the patient's swelling around his colostomy stoma. There was no evidence, however, a bowel obstruction, incarceration or strangulation. There was no evidence of locally recurrent or metastatic rectal carcinoma.  08/11/2017:  Chest CTrevealed no suspicious findings to suggest metastasis within the chest.  10/31/2018:  Chest, abdomen, and pelvis CT revealed unchanged postoperative findings status post abdominal perineal resection with left lower quadrant endostomy. There was a small parastomal hernia, status post repair. There was no evidence of metastatic disease in the chest, abdomen, or pelvis. 10/31/2019:  Chest, abdomen and pelvis CT revealed no findings of recurrent malignancy. Left lower quadrant peristomal hernia had enlarged and contained loops of small bowel without complicating feature. Other imaging findings of potential clinical significance: coronary atherosclerosis, small type 1 hiatal hernia, cholelithiasis, APR with left colostomy, stable 50% anterior compression fracture at L1 and 40% compression at T12, stable grade 1 anterolisthesis at L4-5 and L5-S1 with associated rib right foraminal impingement at both levels.   [facility] ac[facility]ight basal ganglia hemorrhageandleft humeral head fractures/p a fall on 07/21/2015.  [facility] [place]. He was discharged home on Augmentin rather than Flagyl given his alcohol dependency.    No visits with results within 3 Day(s) from this visit.  Latest known visit with results is:  Appointment on 10/28/2019  Component Date Value Ref Range Status  . CEA 10/28/2019 3.0  0.0 - 4.7 ng/mL Final   Comment: (NOTE)                             Nonsmokers          <3.9                             Smokers             <5.6 Roche [employer] Electrochemiluminescence Immunoassay (ECLIA) Values obtained with different assay methods or kits cannot be used interchangeably.  Results cannot be interpreted as absolute evidence of the presence or absence of malignant disease. Performed At: [facility] [place], [place] [id number] Rush Farmer MD FB:[id number]   . Ferritin 10/28/2019 39  24 - 336 ng/mL Final   Performed at [facility], [address] [place], [place] [address]  . Sodium 10/28/2019 132* 135 - 145 mmol/L Final  . Potassium 10/28/2019 4.8  3.5 - 5.1 mmol/L Final  . Chloride 10/28/2019 96* 98 - 111 mmol/L Final  . CO2 10/28/2019 28  22 - 32 mmol/L Final  . Glucose, Bld 10/28/2019 119* 70 - 99 mg/dL Final   Glucose reference range applies only to samples taken after fasting for at least 8 hours.  . BUN 10/28/2019 14  8 - 23 mg/dL Final  . Creatinine, Ser 10/28/2019 0.70  0.61 - 1.24 mg/dL Final  . Calcium 10/28/2019 9.7  8.9 - 10.3 mg/dL Final  . Total Protein 10/28/2019  6.8  6.5 - 8.1 g/dL Final  . Albumin 10/28/2019 3.9  3.5 - 5.0 g/dL Final  . AST 10/28/2019 31  15 - 41 U/L Final  . ALT 10/28/2019 24  0 - 44 U/L Final  . Alkaline Phosphatase 10/28/2019 61  38 - 126 U/L Final  . Total Bilirubin 10/28/2019 0.8  0.3 - 1.2 mg/dL Final  . GFR calc non Af Amer 10/28/2019 >60  >60 mL/min Final  . GFR calc Af Amer 10/28/2019 >60  >60 mL/min Final  . Anion gap 10/28/2019 8  5 -  15 Final   Performed at [facility], [address], [place], [place] [address]  . WBC 10/28/2019 5.0  4.0 - 10.5 K/uL Final  . RBC 10/28/2019 4.43  4.22 - 5.81 MIL/uL Final  . Hemoglobin 10/28/2019 13.8  13.0 - 17.0 g/dL Final  . HCT 10/28/2019 39.6  39 - 52 % Final  . MCV 10/28/2019 89.4  80.0 - 100.0 fL Final  . MCH 10/28/2019 31.2  26.0 - 34.0 pg Final  . MCHC 10/28/2019 34.8  30.0 - 36.0 g/dL Final  . RDW 10/28/2019 13.4  11.5 - 15.5 % Final  . Platelets 10/28/2019 210  150 - 400 K/uL Final  . nRBC 10/28/2019 0.0  0.0 - 0.2 % Final  . Neutrophils Relative % 10/28/2019 65  % Final  . Neutro Abs 10/28/2019 3.2  1.7 - 7.7 K/uL Final  . Lymphocytes Relative 10/28/2019 23  % Final  . Lymphs Abs 10/28/2019 1.2  0.7 - 4.0 K/uL Final  . Monocytes Relative 10/28/2019 8  % Final  . Monocytes Absolute 10/28/2019 0.4  0 - 1 K/uL Final  . Eosinophils Relative 10/28/2019 3  % Final  . Eosinophils Absolute 10/28/2019 0.2  0 - 0 K/uL Final  . Basophils Relative 10/28/2019 1  % Final  . Basophils Absolute 10/28/2019 0.1  0 - 0 K/uL Final  . Immature Granulocytes 10/28/2019 0  % Final  . Abs Immature Granulocytes 10/28/2019 0.01  0.00 - 0.07 K/uL Final   Performed at [facility], [address] [place], [place] [address]    Assessment:  Nazier Neyhart. is a 86 y.o. male with stage IIC (T4bN0M0) low rectal adenocarcinoma s/p neoadjuvant chemotherapyand radiationfollowed by surgery. He presented with rectal bleeding. Colonoscopy on 07/10/2015 revealed a 4 cm frond-like/villous ulcerated non-obstructing mass in the rectum. Biopsy revealed adenocarcinoma.  Chest, abdomen, and pelvic CTscan on 07/27/2015 revealed a circumferential soft tissue mass within the low rectum. There was a T12 compression fracture. CEAwas 4.1 on 07/26/2015.  Flexible sigmoidoscopyand ultrasound on 08/19/2015 revealed a 3.0 x 1.4 cm hypoechoic non-circumferential mass was found in the rectum.  There was no sonographic evidence suggesting breakthrough of the muscularis propria with invasion into the perirectal fat. No lymph nodes were seen in the perirectal region and in the left iliac region. Stage was early T3uN0.  He received 5 weeks of concurrent Xeloda and radiation(09/09/2015 - 10/22/2015). He underwent robotic assisted APRwith end colostomy at [facility] on 12/17/2015 at [facility] Pathology revealed ypT4bN0M0 (stage IIC) invasive moderately differentiated adenocarcinoma. Levator muscle was involved.  He received 6 cycles of adjuvant Xeloda(02/15/2016 - 06/06/2016). Cycle #3 was truncated after 1 week secondary to palmar plantar erythrodysesthesia (hand foot syndrome). Xeloda was dose reduced with cycle #4.  [facility]  Chest, abdomen, pelvis CT on 10/31/2019 revealed no findings of recurrent malignancy. Left lower quadrant peristomal hernia had enlarged and contained  loops of small bowel without complicating feature.   CEAhas been followed: 4.1 on 07/26/2015, 2.6 on 02/01/2016, 2.9 on 07/25/2016, 2.9 on 10/28/2016, 3.1 on 01/27/2017, 2.9 on 04/28/2017, 4.3 on 08/30/2017, 3.2 on 01/01/2018, 3.1 on 05/07/2018, 3.3 on 11/05/2018, 3.0 on 05/03/2019, and 3.0 on 10/28/2019.   Colonoscopyon 04/24/2017 revealed a 3 mm polpy in the ascending colon (tubular adenoma). He had diverticulosis throughout the colon.  He has iron deficiencyanemia. Anemia workup on 03/07/2016 revealed a ferritin of 13, iron saturation 5% and TIBC 469 (high). B12 was 501. Folate was >47. He takes oral B12 daily. B12 was 430 on 01/01/2018. Dietis good. Ferritin was 43 on 04/19/2016, 99 on 07/25/2016, 50 on 10/28/2016, 62 on 04/28/2017, 45 on 01/01/2018, 33 on 11/05/2018, and 39 on 10/28/2019. He discontinued oral iron on 07/29/2016.   He drinksalcohol daily(6 bottles/week). He has mild dementia.   Symptomatically, he is doing well.  He voices no  concerns.  Exam is stable.  Plan: 1.    Review labs from 10/28/2019. 2. Stage IIC rectal carcinoma Clinically, he is doing well.              Exam reveals no evidence of recurrent disease.             Chest, abdomen and pelvis CT on 10/31/2019 was personally reviewed.  Agree with radiology interpretation.   He has no evidence of recurrent disease.               CEA remains 3.0 (normal).           Continue yearly scans for surveillance. 3. Intermittent increased LFTs, resolved AST  31. ALT  24. Continue surveillance. 4. Iron deficiency anemia Ferritin was 39 on 10/28/2019.               He is off oral iron.               Continue to monitor. 5. B12 deficiency B12 was 537 and folate 60 on 11/05/2018.             He remains on oral B12.  Check B12 and folate annually. 6.   RTC in 6 months for MD assessment and labs (CBC with diff, CMP, ferritin, CEA).  I discussed the assessment and treatment plan with the patient.  The patient was provided an opportunity to ask questions and all were answered.  The patient agreed with the plan and demonstrated an understanding of the instructions.  The patient was advised to call back if the symptoms worsen or if the condition fails to improve as anticipated.   Lequita Asal, MD, PhD    11/04/2019, 9:44 AM  I, Selena Batten, am acting as scribe for [employer]. Mike Gip, MD, PhD.  I, Naysha Sholl C. Mike Gip, MD, have reviewed the above documentation for accuracy and completeness, and I agree with the above.

## 2019-11-03 NOTE — Progress Notes (Signed)
No new changes noted today. The patient Name and DOB has been verified by phone today. 

## 2019-11-04 ENCOUNTER — Inpatient Hospital Stay: Payer: Medicare Other | Admitting: Hematology and Oncology

## 2019-11-04 ENCOUNTER — Other Ambulatory Visit: Payer: Self-pay

## 2019-11-04 ENCOUNTER — Inpatient Hospital Stay (HOSPITAL_BASED_OUTPATIENT_CLINIC_OR_DEPARTMENT_OTHER): Payer: Medicare Other | Admitting: Hematology and Oncology

## 2019-11-04 ENCOUNTER — Encounter: Payer: Self-pay | Admitting: Hematology and Oncology

## 2019-11-04 VITALS — BP 107/61 | HR 65 | Temp 97.0°F | Wt 139.6 lb

## 2019-11-04 DIAGNOSIS — D509 Iron deficiency anemia, unspecified: Secondary | ICD-10-CM

## 2019-11-04 DIAGNOSIS — C20 Malignant neoplasm of rectum: Secondary | ICD-10-CM

## 2019-11-04 DIAGNOSIS — Z85048 Personal history of other malignant neoplasm of rectum, rectosigmoid junction, and anus: Secondary | ICD-10-CM | POA: Diagnosis not present

## 2019-11-04 DIAGNOSIS — E538 Deficiency of other specified B group vitamins: Secondary | ICD-10-CM | POA: Diagnosis not present

## 2019-11-12 ENCOUNTER — Other Ambulatory Visit
Admission: RE | Admit: 2019-11-12 | Discharge: 2019-11-12 | Disposition: A | Discharge status: Home / Self Care | Payer: Medicare Other | Attending: Family Medicine | Admitting: Family Medicine

## 2019-11-12 DIAGNOSIS — R0602 Shortness of breath: Secondary | ICD-10-CM | POA: Insufficient documentation

## 2019-11-12 LAB — BRAIN NATRIURETIC PEPTIDE: B Natriuretic Peptide: 773.7 pg/mL — ABNORMAL HIGH (ref 0.0–100.0)

## 2019-11-15 DIAGNOSIS — I4819 Other persistent atrial fibrillation: Secondary | ICD-10-CM | POA: Insufficient documentation

## 2019-12-25 DIAGNOSIS — R55 Syncope and collapse: Secondary | ICD-10-CM | POA: Insufficient documentation

## 2020-01-03 ENCOUNTER — Other Ambulatory Visit
Admission: RE | Admit: 2020-01-03 | Discharge: 2020-01-03 | Disposition: A | Discharge status: Home / Self Care | Payer: Medicare Other | Source: Ambulatory Visit | Attending: Internal Medicine | Admitting: Internal Medicine

## 2020-01-03 ENCOUNTER — Other Ambulatory Visit: Payer: Self-pay

## 2020-01-03 DIAGNOSIS — Z01812 Encounter for preprocedural laboratory examination: Secondary | ICD-10-CM | POA: Diagnosis present

## 2020-01-03 DIAGNOSIS — Z20822 Contact with and (suspected) exposure to covid-19: Secondary | ICD-10-CM | POA: Diagnosis not present

## 2020-01-04 LAB — SARS CORONAVIRUS 2 (TAT 6-24 HRS): SARS Coronavirus 2: NEGATIVE

## 2020-01-07 ENCOUNTER — Observation Stay: Payer: Medicare Other

## 2020-01-07 ENCOUNTER — Observation Stay: Payer: Medicare Other | Admitting: Anesthesiology

## 2020-01-07 ENCOUNTER — Other Ambulatory Visit: Payer: Self-pay

## 2020-01-07 ENCOUNTER — Encounter: Payer: Self-pay | Admitting: Cardiology

## 2020-01-07 ENCOUNTER — Encounter
Admission: RE | Disposition: A | Discharge status: Home / Self Care | Payer: Self-pay | Source: Home / Self Care | Attending: Cardiology

## 2020-01-07 ENCOUNTER — Observation Stay
Admission: RE | Admit: 2020-01-07 | Discharge: 2020-01-08 | Disposition: A | Discharge status: Home / Self Care | Payer: Medicare Other | Attending: Cardiology | Admitting: Cardiology

## 2020-01-07 DIAGNOSIS — Z7982 Long term (current) use of aspirin: Secondary | ICD-10-CM | POA: Insufficient documentation

## 2020-01-07 DIAGNOSIS — I4891 Unspecified atrial fibrillation: Secondary | ICD-10-CM | POA: Diagnosis not present

## 2020-01-07 DIAGNOSIS — I442 Atrioventricular block, complete: Secondary | ICD-10-CM

## 2020-01-07 DIAGNOSIS — I495 Sick sinus syndrome: Secondary | ICD-10-CM | POA: Diagnosis not present

## 2020-01-07 DIAGNOSIS — Z006 Encounter for examination for normal comparison and control in clinical research program: Secondary | ICD-10-CM | POA: Insufficient documentation

## 2020-01-07 DIAGNOSIS — I482 Chronic atrial fibrillation, unspecified: Secondary | ICD-10-CM

## 2020-01-07 DIAGNOSIS — R55 Syncope and collapse: Secondary | ICD-10-CM

## 2020-01-07 DIAGNOSIS — R2241 Localized swelling, mass and lump, right lower limb: Secondary | ICD-10-CM

## 2020-01-07 DIAGNOSIS — Z7901 Long term (current) use of anticoagulants: Secondary | ICD-10-CM | POA: Diagnosis not present

## 2020-01-07 DIAGNOSIS — Z950 Presence of cardiac pacemaker: Secondary | ICD-10-CM

## 2020-01-07 DIAGNOSIS — I871 Compression of vein: Secondary | ICD-10-CM | POA: Diagnosis not present

## 2020-01-07 HISTORY — PX: PACEMAKER LEADLESS INSERTION: EP1219

## 2020-01-07 HISTORY — PX: PACEMAKER INSERTION: SHX728

## 2020-01-07 HISTORY — PX: PERIPHERAL VASCULAR BALLOON ANGIOPLASTY: CATH118281

## 2020-01-07 SURGERY — PACEMAKER LEADLESS INSERTION
Anesthesia: Moderate Sedation

## 2020-01-07 SURGERY — INSERTION, CARDIAC PACEMAKER
Anesthesia: General | Laterality: Left

## 2020-01-07 MED ORDER — CEFAZOLIN SODIUM-DEXTROSE 2-4 GM/100ML-% IV SOLN
2.0000 g | INTRAVENOUS | Status: AC
  Administered 2020-01-07: 2 g via INTRAVENOUS
  Filled 2020-01-07: qty 100

## 2020-01-07 MED ORDER — ONDANSETRON HCL 4 MG/2ML IJ SOLN: 4.0000 mg | Freq: Once | INTRAMUSCULAR | Status: DC | PRN

## 2020-01-07 MED ORDER — SODIUM CHLORIDE 0.9 % IV SOLN
80.0000 mg | INTRAVENOUS | Status: DC
  Filled 2020-01-07: qty 2

## 2020-01-07 MED ORDER — LACTATED RINGERS IV SOLN: INTRAVENOUS | Status: DC | PRN

## 2020-01-07 MED ORDER — ACETAMINOPHEN 325 MG PO TABS: 325.0000 mg | ORAL_TABLET | ORAL | Status: DC | PRN

## 2020-01-07 MED ORDER — SODIUM CHLORIDE 0.9 % IV SOLN
250.0000 mL | INTRAVENOUS | Status: DC | PRN
  Administered 2020-01-07: 25 mL via INTRAVENOUS

## 2020-01-07 MED ORDER — IOHEXOL 300 MG/ML  SOLN
INTRAMUSCULAR | Status: DC | PRN
  Administered 2020-01-07 (×2): 20 mL via INTRAVENOUS

## 2020-01-07 MED ORDER — MIDAZOLAM HCL 2 MG/2ML IJ SOLN
INTRAMUSCULAR | Status: AC
  Filled 2020-01-07: qty 2

## 2020-01-07 MED ORDER — PROPOFOL 10 MG/ML IV BOLUS
INTRAVENOUS | Status: DC | PRN
  Administered 2020-01-07: 50 mg via INTRAVENOUS

## 2020-01-07 MED ORDER — DEXMEDETOMIDINE HCL IN NACL 400 MCG/100ML IV SOLN
INTRAVENOUS | Status: DC | PRN
  Administered 2020-01-07: 4 ug via INTRAVENOUS

## 2020-01-07 MED ORDER — SODIUM CHLORIDE 0.9 % IV SOLN: INTRAVENOUS | Status: DC

## 2020-01-07 MED ORDER — LOSARTAN POTASSIUM 25 MG PO TABS
25.0000 mg | ORAL_TABLET | Freq: Every day | ORAL | Status: DC
  Administered 2020-01-07 – 2020-01-08 (×2): 25 mg via ORAL
  Filled 2020-01-07 (×2): qty 1

## 2020-01-07 MED ORDER — PHENYLEPHRINE HCL (PRESSORS) 10 MG/ML IV SOLN
INTRAVENOUS | Status: DC | PRN
  Administered 2020-01-07 (×4): 100 ug via INTRAVENOUS

## 2020-01-07 MED ORDER — ONDANSETRON HCL 4 MG/2ML IJ SOLN: 4.0000 mg | Freq: Four times a day (QID) | INTRAMUSCULAR | Status: DC | PRN

## 2020-01-07 MED ORDER — MONTELUKAST SODIUM 10 MG PO TABS
10.0000 mg | ORAL_TABLET | Freq: Every day | ORAL | Status: DC
  Administered 2020-01-07: 10 mg via ORAL
  Filled 2020-01-07: qty 1

## 2020-01-07 MED ORDER — SODIUM CHLORIDE 0.9% FLUSH: 3.0000 mL | INTRAVENOUS | Status: DC | PRN

## 2020-01-07 MED ORDER — CEFAZOLIN SODIUM-DEXTROSE 1-4 GM/50ML-% IV SOLN
1.0000 g | Freq: Four times a day (QID) | INTRAVENOUS | Status: AC
  Administered 2020-01-07 – 2020-01-08 (×3): 1 g via INTRAVENOUS
  Filled 2020-01-07 (×7): qty 50

## 2020-01-07 MED ORDER — FUROSEMIDE 20 MG PO TABS
20.0000 mg | ORAL_TABLET | Freq: Every day | ORAL | Status: DC
  Administered 2020-01-07 – 2020-01-08 (×2): 20 mg via ORAL
  Filled 2020-01-07 (×2): qty 1

## 2020-01-07 MED ORDER — HEPARIN (PORCINE) IN NACL 1000-0.9 UT/500ML-% IV SOLN
INTRAVENOUS | Status: AC
  Filled 2020-01-07: qty 1000

## 2020-01-07 MED ORDER — PROPOFOL 500 MG/50ML IV EMUL
INTRAVENOUS | Status: AC
  Filled 2020-01-07: qty 50

## 2020-01-07 MED ORDER — ACETAMINOPHEN 325 MG PO TABS
650.0000 mg | ORAL_TABLET | ORAL | Status: DC | PRN
  Administered 2020-01-07 – 2020-01-08 (×2): 650 mg via ORAL
  Filled 2020-01-07 (×2): qty 2

## 2020-01-07 MED ORDER — SODIUM CHLORIDE 0.9% FLUSH
3.0000 mL | Freq: Two times a day (BID) | INTRAVENOUS | Status: DC
  Administered 2020-01-07 – 2020-01-08 (×3): 3 mL via INTRAVENOUS

## 2020-01-07 MED ORDER — GENTAMICIN SULFATE 40 MG/ML IJ SOLN
INTRAMUSCULAR | Status: AC
  Filled 2020-01-07: qty 2

## 2020-01-07 MED ORDER — HEPARIN (PORCINE) IN NACL 1000-0.9 UT/500ML-% IV SOLN
INTRAVENOUS | Status: DC | PRN
  Administered 2020-01-07 (×2): 500 mL

## 2020-01-07 MED ORDER — SODIUM CHLORIDE 0.45 % IV SOLN: INTRAVENOUS | Status: DC

## 2020-01-07 MED ORDER — PROPOFOL 500 MG/50ML IV EMUL
INTRAVENOUS | Status: DC | PRN
  Administered 2020-01-07: 125 ug/kg/min via INTRAVENOUS

## 2020-01-07 MED ORDER — LIDOCAINE HCL (PF) 1 % IJ SOLN
INTRAMUSCULAR | Status: AC
  Filled 2020-01-07: qty 30

## 2020-01-07 MED ORDER — LIDOCAINE 1 % OPTIME INJ - NO CHARGE
INTRAMUSCULAR | Status: DC | PRN
  Administered 2020-01-07: 30 mL

## 2020-01-07 MED ORDER — HEPARIN SODIUM (PORCINE) 1000 UNIT/ML IJ SOLN
INTRAMUSCULAR | Status: DC | PRN
  Administered 2020-01-07: 4000 [IU] via INTRAVENOUS
  Administered 2020-01-07: 2000 [IU] via INTRAVENOUS

## 2020-01-07 MED ORDER — FENTANYL CITRATE (PF) 100 MCG/2ML IJ SOLN
INTRAMUSCULAR | Status: AC
  Filled 2020-01-07: qty 2

## 2020-01-07 MED ORDER — HEPARIN SODIUM (PORCINE) 1000 UNIT/ML IJ SOLN
INTRAMUSCULAR | Status: AC
  Filled 2020-01-07: qty 1

## 2020-01-07 MED ORDER — GABAPENTIN 100 MG PO CAPS
100.0000 mg | ORAL_CAPSULE | Freq: Two times a day (BID) | ORAL | Status: DC
  Administered 2020-01-07 – 2020-01-08 (×2): 100 mg via ORAL
  Filled 2020-01-07 (×2): qty 1

## 2020-01-07 MED ORDER — FENTANYL CITRATE (PF) 100 MCG/2ML IJ SOLN: 25.0000 ug | INTRAMUSCULAR | Status: DC | PRN

## 2020-01-07 MED ORDER — SODIUM CHLORIDE 0.9 % IV SOLN
INTRAVENOUS | Status: DC | PRN
  Administered 2020-01-07: 120 mL

## 2020-01-07 MED ORDER — DONEPEZIL HCL 5 MG PO TABS
10.0000 mg | ORAL_TABLET | Freq: Every day | ORAL | Status: DC
  Administered 2020-01-07: 10 mg via ORAL
  Filled 2020-01-07 (×3): qty 2

## 2020-01-07 MED ORDER — FENTANYL CITRATE (PF) 100 MCG/2ML IJ SOLN
INTRAMUSCULAR | Status: DC | PRN
  Administered 2020-01-07 (×4): 25 ug via INTRAVENOUS

## 2020-01-07 MED ORDER — MONTELUKAST SODIUM 5 MG PO CHEW
10.0000 mg | CHEWABLE_TABLET | Freq: Every day | ORAL | Status: DC
  Filled 2020-01-07: qty 2

## 2020-01-07 MED ORDER — MIDAZOLAM HCL 2 MG/2ML IJ SOLN
INTRAMUSCULAR | Status: DC | PRN
  Administered 2020-01-07 (×2): 0.5 mg via INTRAVENOUS
  Administered 2020-01-07: 1 mg via INTRAVENOUS

## 2020-01-07 MED ORDER — IOHEXOL 300 MG/ML  SOLN
INTRAMUSCULAR | Status: DC | PRN
  Administered 2020-01-07: 100 mL

## 2020-01-07 SURGICAL SUPPLY — 30 items
BALLN ATG 12X4X80 (BALLOONS) ×3
BALLN ATLAS 14X40X75 (BALLOONS) ×3
BALLN DORADO 10X80X80 (BALLOONS) ×3
BALLN DORADO 8X60X80 (BALLOONS) ×3
BALLOON ATG 12X4X80 (BALLOONS) ×1 IMPLANT
BALLOON ATLAS 14X40X75 (BALLOONS) ×1 IMPLANT
BALLOON DORADO 10X80X80 (BALLOONS) ×1 IMPLANT
BALLOON DORADO 8X60X80 (BALLOONS) ×1 IMPLANT
CATH BEACON 5 .035 65 KMP TIP (CATHETERS) ×3 IMPLANT
DILATOR VESSEL 22 FR (INTRODUCER) ×3 IMPLANT
DILATOR VESSEL 38 20CM 12FR (INTRODUCER) ×3 IMPLANT
DILATOR VESSEL 38 20CM 14FR (INTRODUCER) ×3 IMPLANT
DILATOR VESSEL 38 20CM 18FR (INTRODUCER) ×3 IMPLANT
DILATOR VESSEL 38 20CM 8FR (INTRODUCER) ×3 IMPLANT
DRYSEAL FLEXSHEATH 18FR 33CM (SHEATH) ×2
GUIDEWIRE SUPER STIFF .035X180 (WIRE) ×6 IMPLANT
INTRODUCER 7FR 23CM (INTRODUCER) ×3 IMPLANT
KIT ENCORE 26 ADVANTAGE (KITS) ×3 IMPLANT
MICRA AV TRANSCATH PACING SYS (Pacemaker) IMPLANT
MICRA INTRODUCER SHEATH (SHEATH) ×3
NEEDLE PERC 18GX7CM (NEEDLE) ×3 IMPLANT
PACK CARDIAC CATH (CUSTOM PROCEDURE TRAY) ×3 IMPLANT
SHEATH AVANTI 7FRX11 (SHEATH) ×3 IMPLANT
SHEATH DRYSEAL FLEX 18FR 33CM (SHEATH) ×1 IMPLANT
SHEATH INTRODUCER MICRA (SHEATH) ×1 IMPLANT
SHEATH PINNACLE 11FRX10 (SHEATH) ×3 IMPLANT
SYSTEM PACING TRNSCTH AV MICRA (Pacemaker) IMPLANT
WIRE GUIDERIGHT .035X150 (WIRE) ×3 IMPLANT
WIRE HITORQ VERSACORE ST 145CM (WIRE) ×3 IMPLANT
WIRE STIFF LUNDERQUIST 260MM (WIRE) ×3 IMPLANT

## 2020-01-07 SURGICAL SUPPLY — 38 items
BAG DECANTER FOR FLEXI CONT (MISCELLANEOUS) ×3 IMPLANT
BRUSH SCRUB EZ  4% CHG (MISCELLANEOUS) ×2
BRUSH SCRUB EZ 4% CHG (MISCELLANEOUS) ×1 IMPLANT
CABLE SURG 12 DISP A/V CHANNEL (MISCELLANEOUS) ×3 IMPLANT
CANISTER SUCT 1200ML W/VALVE (MISCELLANEOUS) ×3 IMPLANT
CHLORAPREP W/TINT 26 (MISCELLANEOUS) ×3 IMPLANT
COVER LIGHT HANDLE STERIS (MISCELLANEOUS) ×6 IMPLANT
COVER MAYO STAND REUSABLE (DRAPES) ×3 IMPLANT
COVER WAND RF STERILE (DRAPES) ×3 IMPLANT
DRAPE C-ARM XRAY 36X54 (DRAPES) ×3 IMPLANT
DRSG TEGADERM 4X4.75 (GAUZE/BANDAGES/DRESSINGS) ×3 IMPLANT
DRSG TELFA 4X3 1S NADH ST (GAUZE/BANDAGES/DRESSINGS) ×3 IMPLANT
ELECT REM PT RETURN 9FT ADLT (ELECTROSURGICAL) ×3
ELECTRODE REM PT RTRN 9FT ADLT (ELECTROSURGICAL) ×1 IMPLANT
GLIDEWIRE STIFF .35X180X3 HYDR (WIRE) IMPLANT
GLOVE BIO SURGEON STRL SZ7.5 (GLOVE) ×9 IMPLANT
GLOVE BIO SURGEON STRL SZ8 (GLOVE) ×9 IMPLANT
GOWN STRL REUS W/ TWL LRG LVL3 (GOWN DISPOSABLE) ×1 IMPLANT
GOWN STRL REUS W/ TWL XL LVL3 (GOWN DISPOSABLE) ×1 IMPLANT
GOWN STRL REUS W/TWL LRG LVL3 (GOWN DISPOSABLE) ×2
GOWN STRL REUS W/TWL XL LVL3 (GOWN DISPOSABLE) ×2
IMMOBILIZER SHDR MD LX WHT (SOFTGOODS) IMPLANT
IMMOBILIZER SHDR XL LX WHT (SOFTGOODS) IMPLANT
INTRO PACEMAKR LEAD 9FR 13CM (INTRODUCER) ×3
INTRO PACEMKR SHEATH II 7FR (MISCELLANEOUS) ×6
INTRODUCER PACEMKR LD 9FR 13CM (INTRODUCER) ×1 IMPLANT
INTRODUCER PACEMKR SHTH II 7FR (MISCELLANEOUS) ×2 IMPLANT
IPG PACE AZUR XT SR MRI W1SR01 (Pacemaker) ×1 IMPLANT
IV NS 500ML (IV SOLUTION) ×2
IV NS 500ML BAXH (IV SOLUTION) ×1 IMPLANT
KIT TURNOVER KIT A (KITS) ×3 IMPLANT
LABEL OR SOLS (LABEL) ×3 IMPLANT
LEAD CAPSURE NOVUS 5076-52CM (Lead) ×3 IMPLANT
MARKER SKIN DUAL TIP RULER LAB (MISCELLANEOUS) ×3 IMPLANT
PACE AZURE XT SR MRI W1SR01 (Pacemaker) ×3 IMPLANT
PACK PACE INSERTION (MISCELLANEOUS) ×3 IMPLANT
PAD ONESTEP ZOLL R SERIES ADT (MISCELLANEOUS) ×3 IMPLANT
SUT SILK 0 SH 30 (SUTURE) ×9 IMPLANT

## 2020-01-07 NOTE — Anesthesia Procedure Notes (Signed)
Procedure Name: MAC Date/Time: 01/07/2020 1:51 PM Performed by: Jerrye Noble, CRNA Pre-anesthesia Checklist: Patient identified, Emergency Drugs available, Suction available and Patient being monitored Oxygen Delivery Method: Simple face mask

## 2020-01-07 NOTE — Op Note (Signed)
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  Contrast injection right common femoral and iliac veins  2.  Percutaneous transluminal angioplasty of the external iliac and common iliac veins right side to 14 mm    Anesthesia: Conscious sedation was administered by the cardiology RN under Dr. Keith Rake' supervision.   Sheath: 23 French Medtronic  Contrast: Approximately 20 cc   Fluoroscopy Time: Per the cardiology record minutes  Indications: I am called into the case to assist with placement of the sheath as they were unable to pass the sheath over the Amplatz wire once they had gained venous access.  Procedure:  [facility] a 86 y.o. male who was identified and appropriate procedural time out was performed.  The patient was then placed supine on the table and prepped and draped in the usual sterile fashion.     Initially trying to advance the sheath was not successful.  Serial balloon dilatation of the distal external iliac vein on the right as well as the common iliac vein beginning with a 12 mm balloon and extending it to a 14 mm balloon.  Multiple inflations with both balloons were made.  The Amplatz wire was exchanged for a Lunderquist wire over a Kumpe catheter and positioned in the superior vena cava.  Ultimately after multiple attempts I was able to get the sheath positioned at the junction of the right atrium and the inferior vena cava.  At this point I broke scrub and Dr. Neldon Newport shows will continue with pacemaker implantation.  Findings:   There is a high-grade stricture greater than 80% of the distal external iliac vein as well as secondary strictures of greater than 70% within the  common iliac vein on the right.  Of note reflux of contrast demonstrates the left common iliac vein is quite small and would not be suitable for advancement of the sheath from the left femoral approach.  Following angioplasty there is less than 20% residual stenosis and I was able to advance the sheath.    Disposition: Patient was taken to the recovery room in stable condition having tolerated the procedure well.  Belenda Cruise Sharlet Notaro 01/07/2020,9:50 AM

## 2020-01-07 NOTE — Op Note (Signed)
[facility]   01/07/2020                     3:15 PM  PATIENT:  [facility].    PRE-OPERATIVE DIAGNOSIS:  small electronitc device placed below collarbone  POST-OPERATIVE DIAGNOSIS:  Same  PROCEDURE:  INSERTION PACEMAKER  SURGEON:  Isaias Cowman, MD    ANESTHESIA:     PREOPERATIVE INDICATIONS:  [facility]. is a  86 y.o. male with a diagnosis of small electronitc device placed below collarbone who failed conservative measures and elected for surgical management.    The risks benefits and alternatives were discussed with the patient preoperatively including but not limited to the risks of infection, bleeding, cardiopulmonary complications, the need for revision surgery, among others, and the patient was willing to proceed.   OPERATIVE PROCEDURE: The patient was brought to the operating room in a fasting state.  The left pectoral region was prepped and draped in usual sterile manner.  Anesthesia was then 1% lidocaine locally.  6 cm incision was performed the left pectoral region.  The pacemaker pocket was generated by electrocautery and blunt dissection.  Access was obtained the left subclavian vein under fluoroscopic guidance.  MRI compatible lead was positioned in the right ventricular apical septum ( Medtronic [id number] CapSure fix P [id number]).  The lead was connected to a single-chamber rate responsive pacemaker generator (Medtronic Azure XT SR MRI W [id number]).  This pacemaker pocket was irrigated gentamicin solution.  The pacemaker generator was positioned into the pocket and the pocket was closed with 2-0 and 4-0 Vicryl, respectively.  Steri-Strips and a pressure dressing were applied.  Postprocedural interrogation revealed appropriate ventricular sensing and pacing thresholds.

## 2020-01-07 NOTE — Anesthesia Postprocedure Evaluation (Signed)
Anesthesia Post Note  Patient: Kenneth Luna.  Procedure(s) Performed: INSERTION PACEMAKER (Left )  Patient location during evaluation: PACU Anesthesia Type: General Level of consciousness: awake and alert Pain management: pain level controlled Vital Signs Assessment: post-procedure vital signs reviewed and stable Respiratory status: spontaneous breathing and respiratory function stable Cardiovascular status: stable Anesthetic complications: no   No complications documented.   Last Vitals:  Vitals:   01/07/20 1527 01/07/20 1532  BP:  (!) 143/78  Pulse: (!) 55 (!) 58  Resp: 17 15  Temp:    SpO2: 98% 98%    Last Pain:  Vitals:   01/07/20 1532  TempSrc:   PainSc: 0-No pain                 Shaelyn Decarli,Taiwo K

## 2020-01-07 NOTE — Anesthesia Preprocedure Evaluation (Addendum)
Anesthesia Evaluation  Patient identified by MRN, date of birth, ID band Patient awake    Reviewed: Allergy & Precautions, H&P , NPO status , Patient's Chart, lab work & pertinent test results  History of Anesthesia Complications Negative for: history of anesthetic complications  Airway Mallampati: II  TM Distance: >3 FB     Dental  (+) Teeth Intact   Pulmonary neg sleep apnea, neg COPD, former smoker,    breath sounds clear to auscultation       Cardiovascular hypertension, (-) angina(-) Past MI and (-) Cardiac Stents + dysrhythmias (sick sinus syndrome)  Rate:Normal     Neuro/Psych PSYCHIATRIC DISORDERS Dementia CVA    GI/Hepatic GERD  Controlled,(+)     substance abuse  alcohol use,   Endo/Other  negative endocrine ROS  Renal/GU negative Renal ROS  negative genitourinary   Musculoskeletal   Abdominal   Peds  Hematology negative hematology ROS (+)   Anesthesia Other Findings Past Medical History: No date: Allergic rhinitis No date: Anemia 06/29/2015: B12 deficiency No date: Benign neoplasm of ascending colon No date: Benign neoplasm of descending colon No date: Colon polyp No date: Colostomy in place [facility]) 06/29/2015: DD (diverticular disease) No date: Dementia ([place])     Comment:  memory issues No date: Fatty liver No date: GERD (gastroesophageal reflux disease) No date: Glaucoma No date: Hyperlipidemia No date: Hypertension 07/15/2015: Rectal adenocarcinoma ([place]) No date: Substance abuse ([place])     Comment:  alcohol No date: Traumatic intraparenchymal hemorrhage ([place])  Past Surgical History: 12/17/2015: ABDOMINOPERINEAL PROCTOCOLECTOMY     Comment:  [facility] No date: CATARACT EXTRACTION W/ INTRAOCULAR LENS IMPLANT 07/10/2015: COLONOSCOPY WITH PROPOFOL; N/A     Comment:  Procedure: COLONOSCOPY WITH PROPOFOL;  Surgeon: Lucilla Lame, MD;  Location: [place];  Service:                Endoscopy;  Laterality: N/A;  PER CAREGIVER WAS TOLD THAT              PT WOULD BE 1ST (KEEP PT EARLY AM) 04/24/2017: COLONOSCOPY WITH PROPOFOL; N/A     Comment:  Procedure: COLONOSCOPY WITH PROPOFOL;  Surgeon: Lucilla Lame, MD;  Location: [place];  Service:               Endoscopy;  Laterality: N/A; 07/10/2015: POLYPECTOMY     Comment:  Procedure: POLYPECTOMY;  Surgeon: Lucilla Lame, MD;                Location: [place];  Service: Endoscopy;;  BMI    Body Mass Index: 23.30 kg/m      Reproductive/Obstetrics negative OB ROS                          Anesthesia Physical Anesthesia Plan  ASA: III  Anesthesia Plan: General   Post-op Pain Management:    Induction:   PONV Risk Score and Plan: Propofol infusion and TIVA  Airway Management Planned: Simple Face Mask  Additional Equipment:   Intra-op Plan:   Post-operative Plan:   Informed Consent: I have reviewed the patients History and Physical, chart, labs and discussed the procedure including the risks, benefits and alternatives for the proposed anesthesia with the patient or authorized representative who has indicated his/her understanding and acceptance.  Dental Advisory Given  Plan Discussed with: Anesthesiologist, CRNA and Surgeon  Anesthesia Plan Comments:        Anesthesia Quick Evaluation

## 2020-01-07 NOTE — Progress Notes (Signed)
Plan for pt to go to OR for procedure at 1300. Spoke to OR staff and awaiting a callback from [place] charge nurse to give report.

## 2020-01-07 NOTE — Transfer of Care (Signed)
Immediate Anesthesia Transfer of Care Note  Patient: Kenneth Luna.  Procedure(s) Performed: INSERTION PACEMAKER (Left )  Patient Location: PACU  Anesthesia Type:General  Level of Consciousness: drowsy  Airway & Oxygen Therapy: Patient Spontanous Breathing and Patient connected to face mask oxygen  Post-op Assessment: Report given to RN and Post -op Vital signs reviewed and stable  Post vital signs: Reviewed and stable  Last Vitals:  Vitals Value Taken Time  BP 137/65 01/07/20 1519  Temp 36.4 C 01/07/20 1519  Pulse    Resp 14 01/07/20 1521  SpO2 91 % 01/07/20 1519  Vitals shown include unvalidated device data.  Last Pain:  Vitals:   01/07/20 1224  TempSrc: Tympanic  PainSc:          Complications: No complications documented.

## 2020-01-08 ENCOUNTER — Encounter: Payer: Self-pay | Admitting: Cardiology

## 2020-01-08 ENCOUNTER — Observation Stay: Payer: Medicare Other

## 2020-01-08 DIAGNOSIS — I495 Sick sinus syndrome: Secondary | ICD-10-CM | POA: Diagnosis not present

## 2020-01-08 HISTORY — PX: INSERT / REPLACE / REMOVE PACEMAKER: SUR710

## 2020-01-08 LAB — BASIC METABOLIC PANEL
Anion gap: 10 (ref 5–15)
BUN: 14 mg/dL (ref 8–23)
CO2: 24 mmol/L (ref 22–32)
Calcium: 9.3 mg/dL (ref 8.9–10.3)
Chloride: 96 mmol/L — ABNORMAL LOW (ref 98–111)
Creatinine, Ser: 0.57 mg/dL — ABNORMAL LOW (ref 0.61–1.24)
GFR calc Af Amer: >60 mL/min (ref >60)
GFR calc non Af Amer: >60 mL/min (ref >60)
Glucose, Bld: 105 mg/dL — ABNORMAL HIGH (ref 70–99)
Potassium: 4.1 mmol/L (ref 3.5–5.1)
Sodium: 130 mmol/L — ABNORMAL LOW (ref 135–145)

## 2020-01-08 LAB — CBC
HCT: 36.8 % — ABNORMAL LOW (ref 39.0–52.0)
Hemoglobin: 12.9 g/dL — ABNORMAL LOW (ref 13.0–17.0)
MCH: 30.8 pg (ref 26.0–34.0)
MCHC: 35.1 g/dL (ref 30.0–36.0)
MCV: 87.8 fL (ref 80.0–100.0)
Platelets: 168 10*3/uL (ref 150–400)
RBC: 4.19 MIL/uL — ABNORMAL LOW (ref 4.22–5.81)
RDW: 13.5 % (ref 11.5–15.5)
WBC: 8.2 10*3/uL (ref 4.0–10.5)
nRBC: 0 % (ref 0.0–0.2)

## 2020-01-08 LAB — MAGNESIUM: Magnesium: 2 mg/dL (ref 1.7–2.4)

## 2020-01-08 LAB — PHOSPHORUS: Phosphorus: 2.8 mg/dL (ref 2.5–4.6)

## 2020-01-08 MED ORDER — LORAZEPAM 1 MG PO TABS
1.0000 mg | ORAL_TABLET | ORAL | Status: DC | PRN
  Filled 2020-01-08: qty 2

## 2020-01-08 MED ORDER — CEPHALEXIN 250 MG PO CAPS: 500.0000 mg | ORAL_CAPSULE | Freq: Two times a day (BID) | ORAL | 0 refills | Status: DC

## 2020-01-08 MED ORDER — THIAMINE HCL 100 MG/ML IJ SOLN: 100.0000 mg | Freq: Every day | INTRAMUSCULAR | Status: DC

## 2020-01-08 MED ORDER — ADULT MULTIVITAMIN W/MINERALS CH: 1.0000 | ORAL_TABLET | Freq: Every day | ORAL | Status: DC

## 2020-01-08 MED ORDER — LORAZEPAM 2 MG/ML IJ SOLN: 1.0000 mg | INTRAMUSCULAR | Status: DC | PRN

## 2020-01-08 MED ORDER — APIXABAN 5 MG PO TABS
5.0000 mg | ORAL_TABLET | Freq: Two times a day (BID) | ORAL | Status: DC
  Administered 2020-01-08: 5 mg via ORAL
  Filled 2020-01-08: qty 1

## 2020-01-08 MED ORDER — FOLIC ACID 1 MG PO TABS: 1.0000 mg | ORAL_TABLET | Freq: Every day | ORAL | Status: DC

## 2020-01-08 MED ORDER — THIAMINE HCL 100 MG PO TABS: 100.0000 mg | ORAL_TABLET | Freq: Every day | ORAL | Status: DC

## 2020-01-08 NOTE — Discharge Summary (Signed)
Physician Discharge Summary  Patient ID: Kenneth Luna. MRN: [id number] DOB/AGE: 1932/05/25 86 y.o.  Admit date: 01/07/2020 Discharge date: 01/08/2020  Primary Discharge Diagnosis sick sinus syndrome Secondary Discharge Diagnosis atrial fibrillation  Significant Diagnostic Studies: yes  Consults: None  Hospital Course: The patient is scheduled to undergo elective Micra leadless implantation on 01/07/2020.  Access was obtained to the right femoral vein.  Difficulty was encountered inserting 27 French hydrophilic sheath for leadless pacemaker implantation.  The patient underwent multiple PTA of the right external iliac and common iliac veins to 14 mm.  43 French sheath was introduced however we were unable to pass the Micra leadless delivery catheter through the sheath due to tortuosity and kinking of the sheath.  Decision was made to abort leadless pacemaker implantation procedure and to proceed with traditional chamber pacemaker implantation.  Patient underwent single-chamber pacemaker implantation in the OR successfully, without complication.  He is returned to cardiac medical progressive care unit he had an uncomplicated hospital stay.  Limits revealed predominant atrial fibrillation with intermittent ventricular pacing.  On the morning of 01/08/2020 patient was able to ambulate without difficulty and was discharged home in stable condition.    Discharge Exam: Blood pressure 137/81, pulse 91, temperature 98.6 F (37 C), temperature source Oral, resp. rate 18, height 5\' 6"  (1.676 m), weight 62.2 kg, SpO2 98 %.   General appearance: alert Head: Normocephalic, without obvious abnormality, atraumatic Eyes: conjunctivae/corneas clear. PERRL, EOM's intact. Fundi benign. Ears: normal TM's and external ear canals both ears Nose: Nares normal. Septum midline. Mucosa normal. No drainage or sinus tenderness. Neck: no adenopathy, no carotid bruit, no JVD, supple, symmetrical, trachea  midline and thyroid not enlarged, symmetric, no tenderness/mass/nodules Back: symmetric, no curvature. ROM normal. No CVA tenderness. Resp: clear to auscultation bilaterally Chest wall: no tenderness Cardio: regular rate and rhythm, S1, S2 normal, no murmur, click, rub or gallop GI: soft, non-tender; bowel sounds normal; no masses,  no organomegaly Extremities: extremities normal, atraumatic, no cyanosis or edema Pulses: 2+ and symmetric Labs:   Lab Results  Component Value Date   WBC 5.0 10/28/2019   HGB 13.8 10/28/2019   HCT 39.6 10/28/2019   MCV 89.4 10/28/2019   PLT 210 10/28/2019    Recent Labs  Lab 01/08/20 0503  NA 130*  K 4.1  CL 96*  CO2 24  BUN 14  CREATININE 0.57*  CALCIUM 9.3  GLUCOSE 105*      Radiology: No pneumothorax EKG: Atrial fibrillation with controlled rate  FOLLOW UP PLANS AND APPOINTMENTS  Allergies as of 01/08/2020   No Known Allergies     Medication List    STOP taking these medications   acetaminophen 325 MG tablet Commonly known as: TYLENOL   aspirin EC 81 MG tablet   capecitabine 500 MG tablet Commonly known as: XELODA   cholecalciferol 25 MCG (1000 UNIT) tablet Commonly known as: VITAMIN D   folic acid 1 MG tablet Commonly known as: FOLVITE   VITAMIN B COMPLEX 100 IJ     TAKE these medications   B-100 B-Complex Tabs Take 1 tablet by mouth daily.   cephALEXin 250 MG capsule Commonly known as: Keflex Take 2 capsules (500 mg total) by mouth 2 (two) times daily.   cetirizine 10 MG tablet Commonly known as: ZYRTEC Take 10 mg by mouth daily.   Combigan 0.2-0.5 % ophthalmic solution Generic drug: brimonidine-timolol Place 1 drop into both eyes 2 (two) times daily.   docusate sodium 100 MG  capsule Commonly known as: COLACE Take 100 mg by mouth daily.   donepezil 10 MG tablet Commonly known as: ARICEPT Take 10 mg by mouth at bedtime.   Eliquis 5 MG Tabs tablet Generic drug: apixaban Take 5 mg by mouth 2 (two)  times daily.   fluticasone 50 MCG/ACT nasal spray Commonly known as: FLONASE Place 2 sprays into both nostrils daily as needed for allergies or rhinitis.   furosemide 20 MG tablet Commonly known as: LASIX Take 20 mg by mouth every other day.   gabapentin 100 MG capsule Commonly known as: NEURONTIN Take 100 mg by mouth 2 (two) times daily.   ICAPS AREDS 2 PO Take 1 capsule by mouth in the morning and at bedtime.   losartan 25 MG tablet Commonly known as: COZAAR Take 25 mg by mouth daily.   montelukast 10 MG tablet Commonly known as: SINGULAIR Take 10 mg by mouth at bedtime.   multivitamin tablet Take 1 tablet by mouth daily.   omeprazole 20 MG capsule Commonly known as: PriLOSEC Take 1 capsule (20 mg total) by mouth daily.       Follow-up Information    Corey Skains, MD Follow up in 10 day(s).   Specialty: Cardiology Contact information: [address] [facility] [place] [address] [phone]               BRING ALL MEDICATIONS WITH YOU TO FOLLOW UP APPOINTMENTS  Time spent with patient to include physician time: 25 minutes Signed:  Isaias Cowman MD, PhD, [facility] 01/08/2020, 7:55 AM

## 2020-01-08 NOTE — Progress Notes (Signed)
[facility] Daily Progress Note   Subjective: 01/07/20:             1.  Contrast injection right common femoral and iliac veins             2.  Percutaneous transluminal angioplasty of the external iliac and common iliac veins right side to 14 mm  Patient with right lower extremity swelling / discomfort with ambulation this AM. No SOB or chest pain.   01/08/20: POSITIVE for extensive occlusive DVT in right peroneal, popliteal, and femoral veins as above.  Objective: Vitals:   01/08/20 0326 01/08/20 0326 01/08/20 0829 01/08/20 1142  BP:  137/81 (!) 144/64 (!) 142/74  Pulse:  91 86 72  Resp:  18  18  Temp:  98.6 F (37 C) 98.5 F (36.9 C) 98.1 F (36.7 C)  TempSrc:  Oral Oral Oral  SpO2:  98% 99% 99%  Weight: 62.2 kg     Height:        Intake/Output Summary (Last 24 hours) at 01/08/2020 1349 Last data filed at 01/08/2020 1142 Gross per 24 hour  Intake 1206.6 ml  Output 1860 ml  Net -653.4 ml   Physical Exam: A&Ox3, NAD CV: RRR Pulmonary: CTA Bilaterally Abdomen: Soft, Nontender, Nondistended Vascular:   Laboratory: CBC    Component Value Date/Time   WBC 8.2 01/08/2020 1256   HGB 12.9 (L) 01/08/2020 1256   HCT 36.8 (L) 01/08/2020 1256   PLT 168 01/08/2020 1256   BMET    Component Value Date/Time   NA 130 (L) 01/08/2020 0503   K 4.1 01/08/2020 0503   CL 96 (L) 01/08/2020 0503   CO2 24 01/08/2020 0503   GLUCOSE 105 (H) 01/08/2020 0503   BUN 14 01/08/2020 0503   CREATININE 0.57 (L) 01/08/2020 0503   CALCIUM 9.3 01/08/2020 0503   GFRNONAA >60 01/08/2020 0503   GFRAA >60 01/08/2020 0503   Assessment/Planning: The patient is an 86 year old s/p leadless pacemaker insertion, now with extensive right lower extremity DVT  1) patient with extensive right lower extremity DVT, symptomatic (swelling/discomfort) when ambulating. 2) recommend restarting Eliquis 5 mg p.o. twice daily 3) we will see the patient in our clinic in 1 week to assess for  any propagation. 4) we do have a 2-week.  To move forward with venous bacteremia/thrombolysis if the patient does not find his symptoms improve with conservative management including elevation and oral anticoagulation.  Discussed with Dr. Ellis Parents Maurianna Benard PA-C 01/08/2020 1:49 PM

## 2020-01-08 NOTE — Progress Notes (Signed)
[facility] Card[facility]    SUBJECTIVE: The patient reports that he was doing well with no complaints, preparing for discharge, then suddenly upon ambulation to the restroom, noticed right lower leg swelling, tightness, and discomfort. The nurse sent a message to cardiology to evaluate the patient.   Vitals:   01/07/20 2356 01/08/20 0326 01/08/20 0326 01/08/20 0829  BP: 131/71  137/81 (!) 144/64  Pulse: 79  91 86  Resp: 20  18   Temp: 98.8 F (37.1 C)  98.6 F (37 C) 98.5 F (36.9 C)  TempSrc: Oral  Oral Oral  SpO2: 97%  98% 99%  Weight:  62.2 kg    Height:         Intake/Output Summary (Last 24 hours) at 01/08/2020 1128 Last data filed at 01/08/2020 8341 Gross per 24 hour  Intake 1206.6 ml  Output 1860 ml  Net -653.4 ml      PHYSICAL EXAM  General: Well developed, well nourished, in no acute distress HEENT:  Normocephalic and atramatic Neck:  No JVD.  Lungs: normal effort of breathing of room air. Heart: HRRR  Msk:  Back normal, gait not assessed.  Extremities: right lower leg with warmth, purplish discoloration that is well demarcated from knee to foot (right upper leg normal skin color), and swelling of lower leg  Neuro: Alert and oriented X 3. Psych:  Good affect, responds appropriately   LABS: Basic Metabolic Panel: Recent Labs    01/08/20 0503  NA 130*  K 4.1  CL 96*  CO2 24  GLUCOSE 105*  BUN 14  CREATININE 0.57*  CALCIUM 9.3   Liver Function Tests: No results for input(s): AST, ALT, ALKPHOS, BILITOT, PROT, ALBUMIN in the last 72 hours. No results for input(s): LIPASE, AMYLASE in the last 72 hours. CBC: No results for input(s): WBC, NEUTROABS, HGB, HCT, MCV, PLT in the last 72 hours. Cardiac Enzymes: No results for input(s): CKTOTAL, CKMB, CKMBINDEX, TROPONINI in the last 72 hours. BNP: Invalid input(s): POCBNP D-Dimer: No results for input(s): DDIMER in the last 72 hours. Hemoglobin A1C: No results for input(s): HGBA1C in the last 72 hours. Fasting  Lipid Panel: No results for input(s): CHOL, HDL, LDLCALC, TRIG, CHOLHDL, LDLDIRECT in the last 72 hours. Thyroid Function Tests: No results for input(s): TSH, T4TOTAL, T3FREE, THYROIDAB in the last 72 hours.  Invalid input(s): FREET3 Anemia Panel: No results for input(s): VITAMINB12, FOLATE, FERRITIN, TIBC, IRON, RETICCTPCT in the last 72 hours.  X-ray chest PA or AP  Result Date: 01/07/2020 CLINICAL DATA:  Status post pacemaker insertion. EXAM: CHEST  1 VIEW COMPARISON:  CT chest 10/31/2019 FINDINGS: Mild enlargement the cardiac silhouette. Interval placement of a left subclavian approach pacemaker with single lead projecting over the right ventricle. No consolidation. No visible pleural effusions or pneumothorax. The visualized skeletal structures are unremarkable. Small hiatal hernia, better characterized on prior CT. IMPRESSION: 1. Interval placement of a left subclavian approach pacemaker with single lead projecting over the right ventricle. 2. No acute cardiopulmonary abnormality. 3. Mild cardiomegaly. Electronically Signed   By: Margaretha Sheffield MD   On: 01/07/2020 15:52   EP PPM/ICD IMPLANT  Result Date: 01/07/2020 1. Unsuccessful Micra leadless pacemaker implantation Recommendations Single-chamber pacemaker implantation scheduled for later today  PERIPHERAL VASCULAR CATHETERIZATION  Result Date: 01/07/2020 See op note  PERIPHERAL VASCULAR CATHETERIZATION  Result Date: 01/07/2020 See op note  DG C-Arm 1-60 Min-No Report  Result Date: 01/07/2020 Fluoroscopy was utilized by the requesting physician.  No radiographic interpretation.  ASSESSMENT AND PLAN:  Active Problems:   Sick sinus syndrome ([place])    1. Acute right lower leg edema, warmth, discomfort, and discoloration that occurred upon ambulation this morning status post failed attempt at [place] pacemaker implantation on 01/01/412, which was complicated by venous stricture secondary to DVT requiring percutaneous  transluminal angioplasty of the external iliac and common iliac veins of the right side. Due to high grade stricture, advancement of sheath from left femoral approach was not suitable, and leadless pacemaker procedure was aborted. Right dorsalis pedis pulse palpable. Popliteal pulse difficult to appreciate. 2. Status post dual-chamber pacemaker implantation, 01/07/2020, anticipated discharge today until patient acutely developed right lower leg swelling, warmth, discoloration, and discomfort. 3. Alcohol abuse, caregiver reports patient appears to be withdrawing   Recommendations: 1. US venous right lower extremity to rule out DVT 2. I have contacted Dr. Lucky Cowboy who will evaluate the patient as well. 3. CIWA protocol ordered per nurse request  Clabe Seal, PA-C 01/08/2020 11:28 AM

## 2020-01-08 NOTE — Discharge Instructions (Signed)
Do not lift left arm above head.  Patient may shower 01/09/2020.  Leave Steri-Strips on.  Resume Eliquis on 01/09/2020.

## 2020-01-10 ENCOUNTER — Telehealth (INDEPENDENT_AMBULATORY_CARE_PROVIDER_SITE_OTHER): Payer: Self-pay

## 2020-01-10 NOTE — Telephone Encounter (Signed)
Patient friend Holley Raring left a voicemail stating that the patient is having a lot of pain with his right leg . The patient had surgery (pacemaker insertion) on 01/07/20 and blood clots were found in the right leg. The patient was place on blood thinner (Eliquis) and schedule for appointment on 01/17/20. Patient friend was seeing if the patient could be seen sooner. I spoke with Dr Lucky Cowboy and he recommended for the patient appointment be moved up. The patient has being reschedule to come in 01/14/20

## 2020-01-14 ENCOUNTER — Encounter (INDEPENDENT_AMBULATORY_CARE_PROVIDER_SITE_OTHER): Payer: Self-pay

## 2020-01-14 ENCOUNTER — Other Ambulatory Visit: Payer: Self-pay

## 2020-01-14 ENCOUNTER — Encounter (INDEPENDENT_AMBULATORY_CARE_PROVIDER_SITE_OTHER): Payer: Self-pay | Admitting: Vascular Surgery

## 2020-01-14 ENCOUNTER — Telehealth (INDEPENDENT_AMBULATORY_CARE_PROVIDER_SITE_OTHER): Payer: Self-pay

## 2020-01-14 ENCOUNTER — Ambulatory Visit (INDEPENDENT_AMBULATORY_CARE_PROVIDER_SITE_OTHER): Payer: Medicare Other | Admitting: Vascular Surgery

## 2020-01-14 VITALS — BP 122/64 | HR 65 | Resp 16 | Wt 147.8 lb

## 2020-01-14 DIAGNOSIS — I82411 Acute embolism and thrombosis of right femoral vein: Secondary | ICD-10-CM | POA: Diagnosis not present

## 2020-01-14 DIAGNOSIS — I10 Essential (primary) hypertension: Secondary | ICD-10-CM

## 2020-01-14 DIAGNOSIS — I82409 Acute embolism and thrombosis of unspecified deep veins of unspecified lower extremity: Secondary | ICD-10-CM | POA: Insufficient documentation

## 2020-01-14 DIAGNOSIS — I495 Sick sinus syndrome: Secondary | ICD-10-CM

## 2020-01-14 NOTE — Assessment & Plan Note (Signed)
blood pressure control important in reducing the progression of atherosclerotic disease. On appropriate oral medications.  

## 2020-01-14 NOTE — Progress Notes (Signed)
MRN : [id number]  Kenneth Luna. is a 86 y.o. (1933/03/09) male who presents with chief complaint of  Chief Complaint  Patient presents with  . Follow-up    [facility] 1wk le dvt  .  History of Present Illness: Patient returns today in follow up of his right leg pain and swelling.  The patient had an attempted percutaneous pacemaker placement at [facility] a week ago, but venous strictures in the femoral and iliac veins precluded safe placement and he ultimately had to be converted to a traditional pacemaker.  Postoperatively, it was noted his right leg was markedly swollen and painful.  Follow-up duplex demonstrated an extensive DVT.  Given his age and frail status, it was elected to start him on anticoagulation and see how he did before proceeding with anything invasive immediately after his pacemaker placement.  Since going home, his pain and swelling really have not improved.  He denies any ulceration or infection.  No fevers or chills.  He is not having chest pain or shortness of breath beyond baseline.  Current Outpatient Medications  Medication Sig Dispense Refill  . apixaban (ELIQUIS) 5 MG TABS tablet Take 5 mg by mouth 2 (two) times daily.    . B Complex-Biotin-FA (B-100 B-COMPLEX) TABS Take 1 tablet by mouth daily.    . brimonidine-timolol (COMBIGAN) 0.2-0.5 % ophthalmic solution Place 1 drop into both eyes 2 (two) times daily.    . cephALEXin (KEFLEX) 250 MG capsule Take 2 capsules (500 mg total) by mouth 2 (two) times daily. 14 capsule 0  . cetirizine (ZYRTEC) 10 MG tablet Take 10 mg by mouth daily.     Marland Kitchen docusate sodium (COLACE) 100 MG capsule Take 100 mg by mouth daily.    Marland Kitchen donepezil (ARICEPT) 10 MG tablet Take 10 mg by mouth at bedtime.    . fluticasone (FLONASE) 50 MCG/ACT nasal spray Place 2 sprays into both nostrils daily as needed for allergies or rhinitis.     . furosemide (LASIX) 20 MG tablet Take 20 mg by mouth every other day.    . gabapentin (NEURONTIN) 100 MG capsule  Take 100 mg by mouth 2 (two) times daily.     . montelukast (SINGULAIR) 10 MG tablet Take 10 mg by mouth at bedtime.     . Multiple Vitamin (MULTIVITAMIN) tablet Take 1 tablet by mouth daily.    . Multiple Vitamins-Minerals (ICAPS AREDS 2 PO) Take 1 capsule by mouth in the morning and at bedtime.     Marland Kitchen omeprazole (PRILOSEC) 20 MG capsule Take 1 capsule (20 mg total) by mouth daily. 30 capsule 0  . losartan (COZAAR) 25 MG tablet Take 25 mg by mouth daily.      No current facility-administered medications for this visit.    Past Medical History:  Diagnosis Date  . Allergic rhinitis   . Anemia   . B12 deficiency 06/29/2015  . Benign neoplasm of ascending colon   . Benign neoplasm of descending colon   . Colon polyp   . Colostomy in place [facility])   . DD (diverticular disease) 06/29/2015  . Dementia ([place])    memory issues  . Fatty liver   . GERD (gastroesophageal reflux disease)   . Glaucoma   . Hyperlipidemia   . Hypertension   . Rectal adenocarcinoma ([place]) 07/15/2015  . Substance abuse ([place])    alcohol  . Traumatic intraparenchymal hemorrhage [facility])     Past Surgical History:  Procedure Laterality Date  . ABDOMINOPERINEAL PROCTOCOLECTOMY  12/17/2015   [facility]  . CATARACT EXTRACTION W/ INTRAOCULAR LENS IMPLANT    . COLONOSCOPY WITH PROPOFOL N/A 07/10/2015   Procedure: COLONOSCOPY WITH PROPOFOL;  Surgeon: Lucilla Lame, MD;  Location: [place];  Service: Endoscopy;  Laterality: N/A;  PER CAREGIVER WAS TOLD THAT PT WOULD BE 1ST (KEEP PT EARLY AM)  . COLONOSCOPY WITH PROPOFOL N/A 04/24/2017   Procedure: COLONOSCOPY WITH PROPOFOL;  Surgeon: Lucilla Lame, MD;  Location: [place];  Service: Endoscopy;  Laterality: N/A;  . PACEMAKER INSERTION Left 01/07/2020   Procedure: INSERTION PACEMAKER;  Surgeon: Isaias Cowman, MD;  Location: [facility];  Service: Cardiovascular;  Laterality: Left;  . PACEMAKER LEADLESS INSERTION N/A 01/07/2020   Procedure: PACEMAKER LEADLESS  INSERTION;  Surgeon: Isaias Cowman, MD;  Location: [place] CV LAB;  Service: Cardiovascular;  Laterality: N/A;  . PERIPHERAL VASCULAR BALLOON ANGIOPLASTY N/A 01/07/2020   Procedure: PERIPHERAL VASCULAR BALLOON ANGIOPLASTY;  Surgeon: Algernon Huxley, MD;  Location: [place] CV LAB;  Service: Cardiovascular;  Laterality: N/A;  . PERIPHERAL VASCULAR BALLOON ANGIOPLASTY N/A 01/07/2020   Procedure: PERIPHERAL VASCULAR BALLOON ANGIOPLASTY;  Surgeon: Katha Cabal, MD;  Location: [place] CV LAB;  Service: Cardiovascular;  Laterality: N/A;  . POLYPECTOMY  07/10/2015   Procedure: POLYPECTOMY;  Surgeon: Lucilla Lame, MD;  Location: [place];  Service: Endoscopy;;     Social History   Tobacco Use  . Smoking status: Former Smoker    Types: Pipe    Quit date: 04/18/1978    Years since quitting: 41.7  . Smokeless tobacco: Never Used  Vaping Use  . Vaping Use: Never used  Substance Use Topics  . Alcohol use: Yes    Alcohol/week: 28.0 standard drinks    Types: 28 Glasses of wine per week    Comment: 4 glass of wine daily  . Drug use: No      Family History  Problem Relation Age of Onset  . Cancer Father        Prostate  No bleeding disorders or clotting disorders No aneurysms No autoimmune diseases  No Known Allergies   REVIEW OF SYSTEMS (Negative unless checked)  Constitutional: [] Weight loss  [] Fever  [] Chills Cardiac: [] Chest pain   [] Chest pressure   [x] Palpitations   [] Shortness of breath when laying flat   [] Shortness of breath at rest   [x] Shortness of breath with exertion. Vascular:  [] Pain in legs with walking   [] Pain in legs at rest   [] Pain in legs when laying flat   [] Claudication   [] Pain in feet when walking  [] Pain in feet at rest  [] Pain in feet when laying flat   [x] History of DVT   [x] Phlebitis   [x] Swelling in legs   [] Varicose veins   [] Non-healing ulcers Pulmonary:   [] Uses home oxygen   [] Productive cough   [] Hemoptysis   [] Wheeze   [] COPD   [] Asthma Neurologic:  [] Dizziness  [] Blackouts   [] Seizures   [] History of stroke   [] History of TIA  [] Aphasia   [] Temporary blindness   [] Dysphagia   [] Weakness or numbness in arms   [] Weakness or numbness in legs X positive for memory issues and cognitive issues Musculoskeletal:  [x] Arthritis   [] Joint swelling   [x] Joint pain   [] Low back pain Hematologic:  [] Easy bruising  [] Easy bleeding   [] Hypercoagulable state   [] Anemic   Gastrointestinal:  [] Blood in stool   [] Vomiting blood  [] Gastroesophageal reflux/heartburn   [] Abdominal pain Genitourinary:  [] Chronic kidney disease   []   Difficult urination  [] Frequent urination  [] Burning with urination   [] Hematuria Skin:  [] Rashes   [] Ulcers   [] Wounds Psychological:  [] History of anxiety   []  History of major depression.  Physical Examination  BP 122/64 (BP Location: Right Arm)   Pulse 65   Resp 16   Wt 147 lb 12.8 oz (67 kg)   BMI 23.86 kg/m  Gen:  WD/WN, NAD Head: [place]/AT, No temporalis wasting. Ear/Nose/Throat: Hearing grossly intact, nares w/o erythema or drainage Eyes: Conjunctiva clear. Sclera non-icteric Neck: Supple.  Trachea midline Pulmonary:  Good air movement, no use of accessory muscles.  Cardiac: Irregular Vascular:  Vessel Right Left  Radial Palpable Palpable           Musculoskeletal: M/S 5/5 throughout.  No deformity or atrophy.  3+ right lower extremity edema, 1+ left lower extremity edema. Neurologic: Sensation grossly intact in extremities.  Symmetrical.  Speech is fluent.  Psychiatric: Judgment and insight are fair at best Dermatologic: No rashes or ulcers noted.  No cellulitis or open wounds.       Labs Recent Results (from the past 2160 hour(s))  CEA     Status: None   Collection Time: 10/28/19  9:01 AM  Result Value Ref Range   CEA 3.0 0.0 - 4.7 ng/mL    Comment: (NOTE)                             Nonsmokers          <3.9                             Smokers             <5.6 Roche  Diagnostics Electrochemiluminescence Immunoassay (ECLIA) Values obtained with different assay methods or kits cannot be used interchangeably.  Results cannot be interpreted as absolute evidence of the presence or absence of malignant disease. Performed At: [facility] [place], [place] [id number] Rush Farmer MD OJ:[id number]   Ferritin     Status: None   Collection Time: 10/28/19  9:01 AM  Result Value Ref Range   Ferritin 39 24 - 336 ng/mL    Comment: Performed at [facility], [address] [place], [place] [address]  Comprehensive metabolic panel     Status: Abnormal   Collection Time: 10/28/19  9:01 AM  Result Value Ref Range   Sodium 132 (L) 135 - 145 mmol/L   Potassium 4.8 3.5 - 5.1 mmol/L   Chloride 96 (L) 98 - 111 mmol/L   CO2 28 22 - 32 mmol/L   Glucose, Bld 119 (H) 70 - 99 mg/dL    Comment: Glucose reference range applies only to samples taken after fasting for at least 8 hours.   BUN 14 8 - 23 mg/dL   Creatinine, Ser 0.70 0.61 - 1.24 mg/dL   Calcium 9.7 8.9 - 10.3 mg/dL   Total Protein 6.8 6.5 - 8.1 g/dL   Albumin 3.9 3.5 - 5.0 g/dL   AST 31 15 - 41 U/L   ALT 24 0 - 44 U/L   Alkaline Phosphatase 61 38 - 126 U/L   Total Bilirubin 0.8 0.3 - 1.2 mg/dL   GFR calc non Af Amer >60 >60 mL/min   GFR calc Af Amer >60 >60 mL/min   Anion gap 8 5 - 15    Comment: Performed at [facility], [address] [place], [place] [address]  CBC with Differential/Platelet     Status: None   Collection Time: 10/28/19  9:01 AM  Result Value Ref Range   WBC 5.0 4.0 - 10.5 K/uL   RBC 4.43 4.22 - 5.81 MIL/uL   Hemoglobin 13.8 13.0 - 17.0 g/dL   HCT 39.6 39 - 52 %   MCV 89.4 80.0 - 100.0 fL   MCH 31.2 26.0 - 34.0 pg   MCHC 34.8 30.0 - 36.0 g/dL   RDW 13.4 11.5 - 15.5 %   Platelets 210 150 - 400 K/uL   nRBC 0.0 0.0 - 0.2 %   Neutrophils Relative % 65 %   Neutro Abs 3.2 1.7 - 7.7 K/uL   Lymphocytes Relative 23 %   Lymphs Abs 1.2 0.7 -  4.0 K/uL   Monocytes Relative 8 %   Monocytes Absolute 0.4 0 - 1 K/uL   Eosinophils Relative 3 %   Eosinophils Absolute 0.2 0 - 0 K/uL   Basophils Relative 1 %   Basophils Absolute 0.1 0 - 0 K/uL   Immature Granulocytes 0 %   Abs Immature Granulocytes 0.01 0.00 - 0.07 K/uL    Comment: Performed at [facility], [address] [place], [place] [address]  Brain natriuretic peptide     Status: Abnormal   Collection Time: 11/12/19  5:29 PM  Result Value Ref Range   B Natriuretic Peptide 773.7 (H) 0.0 - 100.0 pg/mL    Comment: Performed at [facility], [address] [place], [place] [address]  SARS CORONAVIRUS 2 (TAT 6-24 HRS) Nasopharyngeal Nasopharyngeal Swab     Status: None   Collection Time: 01/03/20 12:18 PM   Specimen: Nasopharyngeal Swab  Result Value Ref Range   SARS Coronavirus 2 NEGATIVE NEGATIVE    Comment: (NOTE) SARS-CoV-2 target nucleic acids are NOT DETECTED.  The SARS-CoV-2 RNA is generally detectable in upper and lower respiratory specimens during the acute phase of infection. Negative results do not preclude SARS-CoV-2 infection, do not rule out co-infections with other pathogens, and should not be used as the sole basis for treatment or other patient management decisions. Negative results must be combined with clinical observations, patient history, and epidemiological information. The expected result is Negative.  Fact Sheet for Patients: SugarRoll.be  Fact Sheet for Healthcare Providers: https://www.woods-mathews.com/  This test is not yet approved or cleared by the [place] FDA and  has been authorized for detection and/or diagnosis of SARS-CoV-2 by FDA under an Emergency Use Authorization (EUA). This EUA will remain  in effect (meaning this test can be used) for the duration of the COVID-19 declaration under Se ction 564(b)(1) of the Act, 21 U.S.C. section 360bbb-3(b)(1), unless the  authorization is terminated or revoked sooner.  Performed at [facility], [place] [address] [place], [place] [address]   Basic metabolic panel     Status: Abnormal   Collection Time: 01/08/20  5:03 AM  Result Value Ref Range   Sodium 130 (L) 135 - 145 mmol/L   Potassium 4.1 3.5 - 5.1 mmol/L   Chloride 96 (L) 98 - 111 mmol/L   CO2 24 22 - 32 mmol/L   Glucose, Bld 105 (H) 70 - 99 mg/dL    Comment: Glucose reference range applies only to samples taken after fasting for at least 8 hours.   BUN 14 8 - 23 mg/dL   Creatinine, Ser 0.57 (L) 0.61 - 1.24 mg/dL   Calcium 9.3 8.9 -  10.3 mg/dL   GFR calc non Af Amer >60 >60 mL/min   GFR calc Af Amer >60 >60 mL/min   Anion gap 10 5 - 15    Comment: Performed at [facility], [address] [place], [place] [address]  Magnesium     Status: None   Collection Time: 01/08/20 12:56 PM  Result Value Ref Range   Magnesium 2.0 1.7 - 2.4 mg/dL    Comment: Performed at [facility], [address] [place], [place] [address]  Phosphorus     Status: None   Collection Time: 01/08/20 12:56 PM  Result Value Ref Range   Phosphorus 2.8 2.5 - 4.6 mg/dL    Comment: Performed at [facility], [address] [place], [place] [address]  CBC     Status: Abnormal   Collection Time: 01/08/20 12:56 PM  Result Value Ref Range   WBC 8.2 4.0 - 10.5 K/uL   RBC 4.19 (L) 4.22 - 5.81 MIL/uL   Hemoglobin 12.9 (L) 13.0 - 17.0 g/dL   HCT 36.8 (L) 39 - 52 %   MCV 87.8 80.0 - 100.0 fL   MCH 30.8 26.0 - 34.0 pg   MCHC 35.1 30.0 - 36.0 g/dL   RDW 13.5 11.5 - 15.5 %   Platelets 168 150 - 400 K/uL   nRBC 0.0 0.0 - 0.2 %    Comment: Performed at [facility], [address] [place], [place] [address]    Radiology X-ray chest PA or AP  Result Date: 01/07/2020 CLINICAL DATA:  Status post pacemaker insertion. EXAM: CHEST  1 VIEW COMPARISON:  CT chest 10/31/2019 FINDINGS: Mild enlargement the cardiac silhouette. Interval  placement of a left subclavian approach pacemaker with single lead projecting over the right ventricle. No consolidation. No visible pleural effusions or pneumothorax. The visualized skeletal structures are unremarkable. Small hiatal hernia, better characterized on prior CT. IMPRESSION: 1. Interval placement of a left subclavian approach pacemaker with single lead projecting over the right ventricle. 2. No acute cardiopulmonary abnormality. 3. Mild cardiomegaly. Electronically Signed   By: Margaretha Sheffield MD   On: 01/07/2020 15:52   EP PPM/ICD IMPLANT  Result Date: 01/07/2020 1. Unsuccessful Micra leadless pacemaker implantation Recommendations Single-chamber pacemaker implantation scheduled for later today  PERIPHERAL VASCULAR CATHETERIZATION  Result Date: 01/07/2020 See op note  PERIPHERAL VASCULAR CATHETERIZATION  Result Date: 01/07/2020 See op note  US Venous Img Lower Unilateral Right (DVT)  Result Date: 01/08/2020 CLINICAL DATA:  Swelling, pain.   pacemaker placement. EXAM: RIGHT LOWER EXTREMITY VENOUS DOPPLER ULTRASOUND TECHNIQUE: Gray-scale sonography with compression, as well as color and duplex ultrasound, were performed to evaluate the deep venous system(s) from the level of the common femoral vein through the popliteal and proximal calf veins. COMPARISON:  None. FINDINGS: VENOUS Hypoechoic occlusive thrombus through the right femoral and common femoral veins, noncompressible without significant flow signal. Some flow signal noted at the saphenofemoral junction. Thrombus in the deep femoral vein. Partially occlusive hypoechoic thrombus in the popliteal vein with some color Doppler signal. Noncompressible thrombus in peroneal veins. Posterior tibial vein patent. Limited views of the contralateral common femoral vein are unremarkable. OTHER None. Limitations: none IMPRESSION: 1. POSITIVE for extensive occlusive DVT in right peroneal, popliteal, and femoral veins as above. Electronically  Signed   By: Lucrezia Europe M.D.   On: 01/08/2020 12:43   DG C-Arm 1-60 Min-No Report  Result Date: 01/07/2020 Fluoroscopy was utilized by the requesting physician.  No radiographic interpretation.  Assessment/Plan  Sick sinus syndrome [facility]) Status post pacemaker placement.  Attempts at percutaneous pacemaker from a femoral approach were not successful due to his venous stenoses.  Essential (primary) hypertension blood pressure control important in reducing the progression of atherosclerotic disease. On appropriate oral medications.   DVT (deep venous thrombosis) ([place]) The patient has an extensive right lower extremity DVT with pronounced right leg pain and swelling.  He had known venous stenoses in the femoral and iliac veins and has thrombosed below these levels.  He was started on anticoagulation and has tolerated this but it has not improved his symptoms.  Given this, he should have venous thrombectomy to improve the pain and swelling in the right leg both now on long-term.  He probably would not be ideal for thrombolytic therapy given his advanced age and recent pacemaker placement, but percutaneous thrombectomy without TPA would be a safe and reasonable option.  I discussed the risks and benefits of the procedure.  I discussed how it is performed.  He and his caregiver are agreeable to proceed.    [name], MD  01/14/2020 3:59 PM    This note was created with Dragon medical transcription system.  Any errors from dictation are purely unintentional

## 2020-01-14 NOTE — Patient Instructions (Signed)

## 2020-01-14 NOTE — Telephone Encounter (Signed)
The patient was seen in office today and scheduled for a right leg thrombectomy with Dr. Lucky Cowboy on 010/04/21 with a 9:15 am arrival time to the [facility]. Covid testing is on 01/16/20 between 8-1 pm at the [facility]. Pre-procedure instructions were discussed and handed to the patient.

## 2020-01-14 NOTE — H&P (View-Only) (Signed)
MRN : [id number]  Kenneth Luna. is a 86 y.o. (23-Oct-1932) male who presents with chief complaint of  Chief Complaint  Patient presents with  . Follow-up    [facility] 1wk le dvt  .  History of Present Illness: Patient returns today in follow up of his right leg pain and swelling.  The patient had an attempted percutaneous pacemaker placement at [facility] a week ago, but venous strictures in the femoral and iliac veins precluded safe placement and he ultimately had to be converted to a traditional pacemaker.  Postoperatively, it was noted his right leg was markedly swollen and painful.  Follow-up duplex demonstrated an extensive DVT.  Given his age and frail status, it was elected to start him on anticoagulation and see how he did before proceeding with anything invasive immediately after his pacemaker placement.  Since going home, his pain and swelling really have not improved.  He denies any ulceration or infection.  No fevers or chills.  He is not having chest pain or shortness of breath beyond baseline.  Current Outpatient Medications  Medication Sig Dispense Refill  . apixaban (ELIQUIS) 5 MG TABS tablet Take 5 mg by mouth 2 (two) times daily.    . B Complex-Biotin-FA (B-100 B-COMPLEX) TABS Take 1 tablet by mouth daily.    . brimonidine-timolol (COMBIGAN) 0.2-0.5 % ophthalmic solution Place 1 drop into both eyes 2 (two) times daily.    . cephALEXin (KEFLEX) 250 MG capsule Take 2 capsules (500 mg total) by mouth 2 (two) times daily. 14 capsule 0  . cetirizine (ZYRTEC) 10 MG tablet Take 10 mg by mouth daily.     Marland Kitchen docusate sodium (COLACE) 100 MG capsule Take 100 mg by mouth daily.    Marland Kitchen donepezil (ARICEPT) 10 MG tablet Take 10 mg by mouth at bedtime.    . fluticasone (FLONASE) 50 MCG/ACT nasal spray Place 2 sprays into both nostrils daily as needed for allergies or rhinitis.     . furosemide (LASIX) 20 MG tablet Take 20 mg by mouth every other day.    . gabapentin (NEURONTIN) 100 MG capsule  Take 100 mg by mouth 2 (two) times daily.     . montelukast (SINGULAIR) 10 MG tablet Take 10 mg by mouth at bedtime.     . Multiple Vitamin (MULTIVITAMIN) tablet Take 1 tablet by mouth daily.    . Multiple Vitamins-Minerals (ICAPS AREDS 2 PO) Take 1 capsule by mouth in the morning and at bedtime.     Marland Kitchen omeprazole (PRILOSEC) 20 MG capsule Take 1 capsule (20 mg total) by mouth daily. 30 capsule 0  . losartan (COZAAR) 25 MG tablet Take 25 mg by mouth daily.      No current facility-administered medications for this visit.    Past Medical History:  Diagnosis Date  . Allergic rhinitis   . Anemia   . B12 deficiency 06/29/2015  . Benign neoplasm of ascending colon   . Benign neoplasm of descending colon   . Colon polyp   . Colostomy in place [facility])   . DD (diverticular disease) 06/29/2015  . Dementia ([place])    memory issues  . Fatty liver   . GERD (gastroesophageal reflux disease)   . Glaucoma   . Hyperlipidemia   . Hypertension   . Rectal adenocarcinoma ([place]) 07/15/2015  . Substance abuse ([place])    alcohol  . Traumatic intraparenchymal hemorrhage [facility])     Past Surgical History:  Procedure Laterality Date  . ABDOMINOPERINEAL PROCTOCOLECTOMY  12/17/2015   [facility]  . CATARACT EXTRACTION W/ INTRAOCULAR LENS IMPLANT    . COLONOSCOPY WITH PROPOFOL N/A 07/10/2015   Procedure: COLONOSCOPY WITH PROPOFOL;  Surgeon: Lucilla Lame, MD;  Location: [place];  Service: Endoscopy;  Laterality: N/A;  PER CAREGIVER WAS TOLD THAT PT WOULD BE 1ST (KEEP PT EARLY AM)  . COLONOSCOPY WITH PROPOFOL N/A 04/24/2017   Procedure: COLONOSCOPY WITH PROPOFOL;  Surgeon: Lucilla Lame, MD;  Location: [place];  Service: Endoscopy;  Laterality: N/A;  . PACEMAKER INSERTION Left 01/07/2020   Procedure: INSERTION PACEMAKER;  Surgeon: Isaias Cowman, MD;  Location: [facility];  Service: Cardiovascular;  Laterality: Left;  . PACEMAKER LEADLESS INSERTION N/A 01/07/2020   Procedure: PACEMAKER LEADLESS  INSERTION;  Surgeon: Isaias Cowman, MD;  Location: [place] CV LAB;  Service: Cardiovascular;  Laterality: N/A;  . PERIPHERAL VASCULAR BALLOON ANGIOPLASTY N/A 01/07/2020   Procedure: PERIPHERAL VASCULAR BALLOON ANGIOPLASTY;  Surgeon: Algernon Huxley, MD;  Location: [place] [facility];  Service: Cardiovascular;  Laterality: N/A;  . PERIPHERAL VASCULAR BALLOON ANGIOPLASTY N/A 01/07/2020   Procedure: PERIPHERAL VASCULAR BALLOON ANGIOPLASTY;  Surgeon: Katha Cabal, MD;  Location: [place] CV [facility];  Service: Cardiovascular;  Laterality: N/A;  . POLYPECTOMY  07/10/2015   Procedure: POLYPECTOMY;  Surgeon: Lucilla Lame, MD;  Location: [place];  Service: Endoscopy;;     Social History   Tobacco Use  . Smoking status: Former Smoker    Types: Pipe    Quit date: 04/18/1978    Years since quitting: 41.7  . Smokeless tobacco: Never Used  Vaping Use  . Vaping Use: Never used  Substance Use Topics  . Alcohol use: Yes    Alcohol/week: 28.0 standard drinks    Types: 28 Glasses of wine per week    Comment: 4 glass of wine daily  . Drug use: No      Family History  Problem Relation Age of Onset  . Cancer Father        Prostate  No bleeding disorders or clotting disorders No aneurysms No autoimmune diseases  No Known Allergies   REVIEW OF SYSTEMS (Negative unless checked)  Constitutional: [] Weight loss  [] Fever  [] Chills Cardiac: [] Chest pain   [] Chest pressure   [x] Palpitations   [] Shortness of breath when laying flat   [] Shortness of breath at rest   [x] Shortness of breath with exertion. Vascular:  [] Pain in legs with walking   [] Pain in legs at rest   [] Pain in legs when laying flat   [] Claudication   [] Pain in feet when walking  [] Pain in feet at rest  [] Pain in feet when laying flat   [x] History of DVT   [x] Phlebitis   [x] Swelling in legs   [] Varicose veins   [] Non-healing ulcers Pulmonary:   [] Uses home oxygen   [] Productive cough   [] Hemoptysis   [] Wheeze   [] COPD   [] Asthma Neurologic:  [] Dizziness  [] Blackouts   [] Seizures   [] History of stroke   [] History of TIA  [] Aphasia   [] Temporary blindness   [] Dysphagia   [] Weakness or numbness in arms   [] Weakness or numbness in legs X positive for memory issues and cognitive issues Musculoskeletal:  [x] Arthritis   [] Joint swelling   [x] Joint pain   [] Low back pain Hematologic:  [] Easy bruising  [] Easy bleeding   [] Hypercoagulable state   [] Anemic   Gastrointestinal:  [] Blood in stool   [] Vomiting blood  [] Gastroesophageal reflux/heartburn   [] Abdominal pain Genitourinary:  [] Chronic kidney disease   []   Difficult urination  [] Frequent urination  [] Burning with urination   [] Hematuria Skin:  [] Rashes   [] Ulcers   [] Wounds Psychological:  [] History of anxiety   []  History of major depression.  Physical Examination  BP 122/64 (BP Location: Right Arm)   Pulse 65   Resp 16   Wt 147 lb 12.8 oz (67 kg)   BMI 23.86 kg/m  Gen:  WD/WN, NAD Head: [place]/AT, No temporalis wasting. Ear/Nose/Throat: Hearing grossly intact, nares w/o erythema or drainage Eyes: Conjunctiva clear. Sclera non-icteric Neck: Supple.  Trachea midline Pulmonary:  Good air movement, no use of accessory muscles.  Cardiac: Irregular Vascular:  Vessel Right Left  Radial Palpable Palpable           Musculoskeletal: M/S 5/5 throughout.  No deformity or atrophy.  3+ right lower extremity edema, 1+ left lower extremity edema. Neurologic: Sensation grossly intact in extremities.  Symmetrical.  Speech is fluent.  Psychiatric: Judgment and insight are fair at best Dermatologic: No rashes or ulcers noted.  No cellulitis or open wounds.       Labs Recent Results (from the past 2160 hour(s))  CEA     Status: None   Collection Time: 10/28/19  9:01 AM  Result Value Ref Range   CEA 3.0 0.0 - 4.7 ng/mL    Comment: (NOTE)                             Nonsmokers          <3.9                             Smokers             <5.6 Roche  Diagnostics Electrochemiluminescence Immunoassay (ECLIA) Values obtained with different assay methods or kits cannot be used interchangeably.  Results cannot be interpreted as absolute evidence of the presence or absence of malignant disease. Performed At: [facility] [place], [place] [id number] Rush Farmer MD NF:[id number]   Ferritin     Status: None   Collection Time: 10/28/19  9:01 AM  Result Value Ref Range   Ferritin 39 24 - 336 ng/mL    Comment: Performed at [facility], [address] [place], [place] [address]  Comprehensive metabolic panel     Status: Abnormal   Collection Time: 10/28/19  9:01 AM  Result Value Ref Range   Sodium 132 (L) 135 - 145 mmol/L   Potassium 4.8 3.5 - 5.1 mmol/L   Chloride 96 (L) 98 - 111 mmol/L   CO2 28 22 - 32 mmol/L   Glucose, Bld 119 (H) 70 - 99 mg/dL    Comment: Glucose reference range applies only to samples taken after fasting for at least 8 hours.   BUN 14 8 - 23 mg/dL   Creatinine, Ser 0.70 0.61 - 1.24 mg/dL   Calcium 9.7 8.9 - 10.3 mg/dL   Total Protein 6.8 6.5 - 8.1 g/dL   Albumin 3.9 3.5 - 5.0 g/dL   AST 31 15 - 41 U/L   ALT 24 0 - 44 U/L   Alkaline Phosphatase 61 38 - 126 U/L   Total Bilirubin 0.8 0.3 - 1.2 mg/dL   GFR calc non Af Amer >60 >60 mL/min   GFR calc Af Amer >60 >60 mL/min   Anion gap 8 5 - 15    Comment: Performed at [facility], [address] [place], [place] [address]  CBC with Differential/Platelet     Status: None   Collection Time: 10/28/19  9:01 AM  Result Value Ref Range   WBC 5.0 4.0 - 10.5 K/uL   RBC 4.43 4.22 - 5.81 MIL/uL   Hemoglobin 13.8 13.0 - 17.0 g/dL   HCT 39.6 39 - 52 %   MCV 89.4 80.0 - 100.0 fL   MCH 31.2 26.0 - 34.0 pg   MCHC 34.8 30.0 - 36.0 g/dL   RDW 13.4 11.5 - 15.5 %   Platelets 210 150 - 400 K/uL   nRBC 0.0 0.0 - 0.2 %   Neutrophils Relative % 65 %   Neutro Abs 3.2 1.7 - 7.7 K/uL   Lymphocytes Relative 23 %   Lymphs Abs 1.2 0.7 -  4.0 K/uL   Monocytes Relative 8 %   Monocytes Absolute 0.4 0 - 1 K/uL   Eosinophils Relative 3 %   Eosinophils Absolute 0.2 0 - 0 K/uL   Basophils Relative 1 %   Basophils Absolute 0.1 0 - 0 K/uL   Immature Granulocytes 0 %   Abs Immature Granulocytes 0.01 0.00 - 0.07 K/uL    Comment: Performed at [facility], [address] [place], [place] [address]  Brain natriuretic peptide     Status: Abnormal   Collection Time: 11/12/19  5:29 PM  Result Value Ref Range   B Natriuretic Peptide 773.7 (H) 0.0 - 100.0 pg/mL    Comment: Performed at [facility], [address] [place], [place] [address]  SARS CORONAVIRUS 2 (TAT 6-24 HRS) Nasopharyngeal Nasopharyngeal Swab     Status: None   Collection Time: 01/03/20 12:18 PM   Specimen: Nasopharyngeal Swab  Result Value Ref Range   SARS Coronavirus 2 NEGATIVE NEGATIVE    Comment: (NOTE) SARS-CoV-2 target nucleic acids are NOT DETECTED.  The SARS-CoV-2 RNA is generally detectable in upper and lower respiratory specimens during the acute phase of infection. Negative results do not preclude SARS-CoV-2 infection, do not rule out co-infections with other pathogens, and should not be used as the sole basis for treatment or other patient management decisions. Negative results must be combined with clinical observations, patient history, and epidemiological information. The expected result is Negative.  Fact Sheet for Patients: SugarRoll.be  Fact Sheet for Healthcare Providers: https://www.woods-mathews.com/  This test is not yet approved or cleared by the [place] FDA and  has been authorized for detection and/or diagnosis of SARS-CoV-2 by FDA under an Emergency Use Authorization (EUA). This EUA will remain  in effect (meaning this test can be used) for the duration of the COVID-19 declaration under Se ction 564(b)(1) of the Act, 21 U.S.C. section 360bbb-3(b)(1), unless the  authorization is terminated or revoked sooner.  Performed at [facility], [place] [address], [place], [place] [address]   Basic metabolic panel     Status: Abnormal   Collection Time: 01/08/20  5:03 AM  Result Value Ref Range   Sodium 130 (L) 135 - 145 mmol/L   Potassium 4.1 3.5 - 5.1 mmol/L   Chloride 96 (L) 98 - 111 mmol/L   CO2 24 22 - 32 mmol/L   Glucose, Bld 105 (H) 70 - 99 mg/dL    Comment: Glucose reference range applies only to samples taken after fasting for at least 8 hours.   BUN 14 8 - 23 mg/dL   Creatinine, Ser 0.57 (L) 0.61 - 1.24 mg/dL   Calcium 9.3 8.9 -  10.3 mg/dL   GFR calc non Af Amer >60 >60 mL/min   GFR calc Af Amer >60 >60 mL/min   Anion gap 10 5 - 15    Comment: Performed at [facility], [address] [place], [place] [address]  Magnesium     Status: None   Collection Time: 01/08/20 12:56 PM  Result Value Ref Range   Magnesium 2.0 1.7 - 2.4 mg/dL    Comment: Performed at [facility], [address] [place], [place] [address]  Phosphorus     Status: None   Collection Time: 01/08/20 12:56 PM  Result Value Ref Range   Phosphorus 2.8 2.5 - 4.6 mg/dL    Comment: Performed at [facility], [address] [place], [place] [address]  CBC     Status: Abnormal   Collection Time: 01/08/20 12:56 PM  Result Value Ref Range   WBC 8.2 4.0 - 10.5 K/uL   RBC 4.19 (L) 4.22 - 5.81 MIL/uL   Hemoglobin 12.9 (L) 13.0 - 17.0 g/dL   HCT 36.8 (L) 39 - 52 %   MCV 87.8 80.0 - 100.0 fL   MCH 30.8 26.0 - 34.0 pg   MCHC 35.1 30.0 - 36.0 g/dL   RDW 13.5 11.5 - 15.5 %   Platelets 168 150 - 400 K/uL   nRBC 0.0 0.0 - 0.2 %    Comment: Performed at [facility], [address] [place], [place] [address]    Radiology X-ray chest PA or AP  Result Date: 01/07/2020 CLINICAL DATA:  Status post pacemaker insertion. EXAM: CHEST  1 VIEW COMPARISON:  CT chest 10/31/2019 FINDINGS: Mild enlargement the cardiac silhouette. Interval  placement of a left subclavian approach pacemaker with single lead projecting over the right ventricle. No consolidation. No visible pleural effusions or pneumothorax. The visualized skeletal structures are unremarkable. Small hiatal hernia, better characterized on prior CT. IMPRESSION: 1. Interval placement of a left subclavian approach pacemaker with single lead projecting over the right ventricle. 2. No acute cardiopulmonary abnormality. 3. Mild cardiomegaly. Electronically Signed   By: Margaretha Sheffield MD   On: 01/07/2020 15:52   EP PPM/ICD IMPLANT  Result Date: 01/07/2020 1. Unsuccessful Micra leadless pacemaker implantation Recommendations Single-chamber pacemaker implantation scheduled for later today  PERIPHERAL VASCULAR CATHETERIZATION  Result Date: 01/07/2020 See op note  PERIPHERAL VASCULAR CATHETERIZATION  Result Date: 01/07/2020 See op note  US Venous Img Lower Unilateral Right (DVT)  Result Date: 01/08/2020 CLINICAL DATA:  Swelling, pain.   pacemaker placement. EXAM: RIGHT LOWER EXTREMITY VENOUS DOPPLER ULTRASOUND TECHNIQUE: Gray-scale sonography with compression, as well as color and duplex ultrasound, were performed to evaluate the deep venous system(s) from the level of the common femoral vein through the popliteal and proximal calf veins. COMPARISON:  None. FINDINGS: VENOUS Hypoechoic occlusive thrombus through the right femoral and common femoral veins, noncompressible without significant flow signal. Some flow signal noted at the saphenofemoral junction. Thrombus in the deep femoral vein. Partially occlusive hypoechoic thrombus in the popliteal vein with some color Doppler signal. Noncompressible thrombus in peroneal veins. Posterior tibial vein patent. Limited views of the contralateral common femoral vein are unremarkable. OTHER None. Limitations: none IMPRESSION: 1. POSITIVE for extensive occlusive DVT in right peroneal, popliteal, and femoral veins as above. Electronically  Signed   By: Lucrezia Europe M.D.   On: 01/08/2020 12:43   DG C-Arm 1-60 Min-No Report  Result Date: 01/07/2020 Fluoroscopy was utilized by the requesting physician.  No radiographic interpretation.  Assessment/Plan  Sick sinus syndrome [facility]) Status post pacemaker placement.  Attempts at percutaneous pacemaker from a femoral approach were not successful due to his venous stenoses.  Essential (primary) hypertension blood pressure control important in reducing the progression of atherosclerotic disease. On appropriate oral medications.   DVT (deep venous thrombosis) ([place]) The patient has an extensive right lower extremity DVT with pronounced right leg pain and swelling.  He had known venous stenoses in the femoral and iliac veins and has thrombosed below these levels.  He was started on anticoagulation and has tolerated this but it has not improved his symptoms.  Given this, he should have venous thrombectomy to improve the pain and swelling in the right leg both now on long-term.  He probably would not be ideal for thrombolytic therapy given his advanced age and recent pacemaker placement, but percutaneous thrombectomy without TPA would be a safe and reasonable option.  I discussed the risks and benefits of the procedure.  I discussed how it is performed.  He and his caregiver are agreeable to proceed.    [name], MD  01/14/2020 3:59 PM    This note was created with Dragon medical transcription system.  Any errors from dictation are purely unintentional

## 2020-01-14 NOTE — Assessment & Plan Note (Signed)
The patient has an extensive right lower extremity DVT with pronounced right leg pain and swelling.  He had known venous stenoses in the femoral and iliac veins and has thrombosed below these levels.  He was started on anticoagulation and has tolerated this but it has not improved his symptoms.  Given this, he should have venous thrombectomy to improve the pain and swelling in the right leg both now on long-term.  He probably would not be ideal for thrombolytic therapy given his advanced age and recent pacemaker placement, but percutaneous thrombectomy without TPA would be a safe and reasonable option.  I discussed the risks and benefits of the procedure.  I discussed how it is performed.  He and his caregiver are agreeable to proceed.

## 2020-01-14 NOTE — Assessment & Plan Note (Signed)
Status post pacemaker placement.  Attempts at percutaneous pacemaker from a femoral approach were not successful due to his venous stenoses.

## 2020-01-16 ENCOUNTER — Other Ambulatory Visit
Admission: RE | Admit: 2020-01-16 | Discharge: 2020-01-16 | Disposition: A | Discharge status: Home / Self Care | Payer: Medicare Other | Source: Ambulatory Visit | Attending: Vascular Surgery | Admitting: Vascular Surgery

## 2020-01-16 ENCOUNTER — Other Ambulatory Visit: Payer: Self-pay

## 2020-01-16 DIAGNOSIS — Z20822 Contact with and (suspected) exposure to covid-19: Secondary | ICD-10-CM | POA: Insufficient documentation

## 2020-01-16 DIAGNOSIS — Z01812 Encounter for preprocedural laboratory examination: Secondary | ICD-10-CM | POA: Diagnosis present

## 2020-01-16 LAB — SARS CORONAVIRUS 2 (TAT 6-24 HRS): SARS Coronavirus 2: NEGATIVE

## 2020-01-17 ENCOUNTER — Ambulatory Visit (INDEPENDENT_AMBULATORY_CARE_PROVIDER_SITE_OTHER): Payer: Self-pay | Admitting: Nurse Practitioner

## 2020-01-17 ENCOUNTER — Other Ambulatory Visit (INDEPENDENT_AMBULATORY_CARE_PROVIDER_SITE_OTHER): Payer: Self-pay | Admitting: Nurse Practitioner

## 2020-01-17 ENCOUNTER — Encounter (INDEPENDENT_AMBULATORY_CARE_PROVIDER_SITE_OTHER): Payer: Self-pay

## 2020-01-17 DIAGNOSIS — I82419 Acute embolism and thrombosis of unspecified femoral vein: Secondary | ICD-10-CM | POA: Insufficient documentation

## 2020-01-20 ENCOUNTER — Encounter: Payer: Self-pay | Admitting: Vascular Surgery

## 2020-01-20 ENCOUNTER — Ambulatory Visit
Admission: RE | Admit: 2020-01-20 | Discharge: 2020-01-20 | Disposition: A | Discharge status: Home / Self Care | Payer: Medicare Other | Attending: Vascular Surgery | Admitting: Vascular Surgery

## 2020-01-20 ENCOUNTER — Encounter
Admission: RE | Disposition: A | Discharge status: Home / Self Care | Payer: Self-pay | Source: Home / Self Care | Attending: Vascular Surgery

## 2020-01-20 ENCOUNTER — Other Ambulatory Visit: Payer: Self-pay

## 2020-01-20 ENCOUNTER — Telehealth (INDEPENDENT_AMBULATORY_CARE_PROVIDER_SITE_OTHER): Payer: Self-pay | Admitting: Vascular Surgery

## 2020-01-20 DIAGNOSIS — Z7901 Long term (current) use of anticoagulants: Secondary | ICD-10-CM | POA: Diagnosis not present

## 2020-01-20 DIAGNOSIS — F0390 Unspecified dementia without behavioral disturbance: Secondary | ICD-10-CM | POA: Diagnosis not present

## 2020-01-20 DIAGNOSIS — I824Z1 Acute embolism and thrombosis of unspecified deep veins of right distal lower extremity: Secondary | ICD-10-CM | POA: Insufficient documentation

## 2020-01-20 DIAGNOSIS — I10 Essential (primary) hypertension: Secondary | ICD-10-CM | POA: Insufficient documentation

## 2020-01-20 DIAGNOSIS — H409 Unspecified glaucoma: Secondary | ICD-10-CM | POA: Insufficient documentation

## 2020-01-20 DIAGNOSIS — Z87891 Personal history of nicotine dependence: Secondary | ICD-10-CM | POA: Insufficient documentation

## 2020-01-20 DIAGNOSIS — Z79899 Other long term (current) drug therapy: Secondary | ICD-10-CM | POA: Insufficient documentation

## 2020-01-20 DIAGNOSIS — K219 Gastro-esophageal reflux disease without esophagitis: Secondary | ICD-10-CM | POA: Insufficient documentation

## 2020-01-20 DIAGNOSIS — I82421 Acute embolism and thrombosis of right iliac vein: Secondary | ICD-10-CM | POA: Insufficient documentation

## 2020-01-20 DIAGNOSIS — K760 Fatty (change of) liver, not elsewhere classified: Secondary | ICD-10-CM | POA: Diagnosis not present

## 2020-01-20 DIAGNOSIS — I495 Sick sinus syndrome: Secondary | ICD-10-CM | POA: Diagnosis not present

## 2020-01-20 DIAGNOSIS — E785 Hyperlipidemia, unspecified: Secondary | ICD-10-CM | POA: Insufficient documentation

## 2020-01-20 DIAGNOSIS — Z950 Presence of cardiac pacemaker: Secondary | ICD-10-CM | POA: Diagnosis not present

## 2020-01-20 DIAGNOSIS — I82409 Acute embolism and thrombosis of unspecified deep veins of unspecified lower extremity: Secondary | ICD-10-CM

## 2020-01-20 DIAGNOSIS — I82411 Acute embolism and thrombosis of right femoral vein: Secondary | ICD-10-CM | POA: Insufficient documentation

## 2020-01-20 DIAGNOSIS — I82491 Acute embolism and thrombosis of other specified deep vein of right lower extremity: Secondary | ICD-10-CM

## 2020-01-20 HISTORY — PX: PERIPHERAL VASCULAR THROMBECTOMY: CATH118306

## 2020-01-20 LAB — BUN: BUN: 11 mg/dL (ref 8–23)

## 2020-01-20 LAB — CREATININE, SERUM
Creatinine, Ser: 0.62 mg/dL (ref 0.61–1.24)
GFR calc Af Amer: >60 mL/min (ref >60)
GFR calc non Af Amer: >60 mL/min (ref >60)

## 2020-01-20 SURGERY — PERIPHERAL VASCULAR THROMBECTOMY
Anesthesia: Moderate Sedation | Laterality: Right

## 2020-01-20 MED ORDER — FENTANYL CITRATE (PF) 100 MCG/2ML IJ SOLN
INTRAMUSCULAR | Status: DC | PRN
Start: 2020-01-20 — End: 2020-01-20
  Administered 2020-01-20: 50 ug via INTRAVENOUS
  Administered 2020-01-20: 25 ug via INTRAVENOUS

## 2020-01-20 MED ORDER — HEPARIN SODIUM (PORCINE) 1000 UNIT/ML IJ SOLN
INTRAMUSCULAR | Status: AC
  Filled 2020-01-20: qty 1

## 2020-01-20 MED ORDER — FENTANYL CITRATE (PF) 100 MCG/2ML IJ SOLN
INTRAMUSCULAR | Status: AC
  Filled 2020-01-20: qty 2

## 2020-01-20 MED ORDER — CEFAZOLIN SODIUM-DEXTROSE 2-4 GM/100ML-% IV SOLN
INTRAVENOUS | Status: AC
  Administered 2020-01-20: 2 g via INTRAVENOUS
  Filled 2020-01-20: qty 100

## 2020-01-20 MED ORDER — MIDAZOLAM HCL 2 MG/2ML IJ SOLN
INTRAMUSCULAR | Status: DC | PRN
  Administered 2020-01-20: 1 mg via INTRAVENOUS
  Administered 2020-01-20: 2 mg via INTRAVENOUS

## 2020-01-20 MED ORDER — MIDAZOLAM HCL 5 MG/5ML IJ SOLN
INTRAMUSCULAR | Status: AC
  Filled 2020-01-20: qty 5

## 2020-01-20 MED ORDER — HYDROMORPHONE HCL 1 MG/ML IJ SOLN: 1.0000 mg | Freq: Once | INTRAMUSCULAR | Status: DC | PRN

## 2020-01-20 MED ORDER — FAMOTIDINE 20 MG PO TABS: 40.0000 mg | ORAL_TABLET | Freq: Once | ORAL | Status: DC | PRN

## 2020-01-20 MED ORDER — DIPHENHYDRAMINE HCL 50 MG/ML IJ SOLN: 50.0000 mg | Freq: Once | INTRAMUSCULAR | Status: DC | PRN

## 2020-01-20 MED ORDER — CEFAZOLIN SODIUM-DEXTROSE 2-4 GM/100ML-% IV SOLN: 2.0000 g | Freq: Once | INTRAVENOUS | Status: AC

## 2020-01-20 MED ORDER — MIDAZOLAM HCL 2 MG/ML PO SYRP: 8.0000 mg | ORAL_SOLUTION | Freq: Once | ORAL | Status: DC | PRN

## 2020-01-20 MED ORDER — ONDANSETRON HCL 4 MG/2ML IJ SOLN: 4.0000 mg | Freq: Four times a day (QID) | INTRAMUSCULAR | Status: DC | PRN

## 2020-01-20 MED ORDER — METHYLPREDNISOLONE SODIUM SUCC 125 MG IJ SOLR: 125.0000 mg | Freq: Once | INTRAMUSCULAR | Status: DC | PRN

## 2020-01-20 MED ORDER — SODIUM CHLORIDE 0.9 % IV SOLN: INTRAVENOUS | Status: DC

## 2020-01-20 MED ORDER — HEPARIN SODIUM (PORCINE) 1000 UNIT/ML IJ SOLN
INTRAMUSCULAR | Status: DC | PRN
  Administered 2020-01-20: 3000 [IU] via INTRAVENOUS

## 2020-01-20 SURGICAL SUPPLY — 13 items
BALLN ATG 16X6X80 (BALLOONS) ×3
BALLN ULTRVRSE 12X80X75 (BALLOONS) ×3
BALLOON ATG 16X6X80 (BALLOONS) ×1 IMPLANT
BALLOON ULTRVRSE 12X80X75 (BALLOONS) ×1 IMPLANT
CANISTER PENUMBRA ENGINE (MISCELLANEOUS) ×3 IMPLANT
CANNULA 5F STIFF (CANNULA) ×3 IMPLANT
CATH BEACON 5 .035 65 KMP TIP (CATHETERS) ×3 IMPLANT
CATH INDIGO 12XTORQ 100 (CATHETERS) ×3 IMPLANT
GLIDEWIRE ADV .035X180CM (WIRE) ×3 IMPLANT
KIT ENCORE 26 ADVANTAGE (KITS) ×3 IMPLANT
PACK ANGIOGRAPHY (CUSTOM PROCEDURE TRAY) ×3 IMPLANT
SHEATH PINNACLE 11FRX10 (SHEATH) ×3 IMPLANT
WIRE J 3MM .035X145CM (WIRE) ×3 IMPLANT

## 2020-01-20 NOTE — Progress Notes (Signed)
HOB elevated, pt tolerated lunch.

## 2020-01-20 NOTE — Telephone Encounter (Signed)
The pt wants to know do we have anymore appointments available spots  on 10/26 other that 7:30.AM they are scheduled to have a U/S can you please look on you calender and  for this date an let me know?

## 2020-01-20 NOTE — Telephone Encounter (Signed)
Is it ok for this Kenneth Luna to wait till 11/9 to be seen due to them not wanting to seen at 7:30 AM the Kenneth Luna wanted to know would this date be ok with Dr. Lucky Cowboy as well.

## 2020-01-20 NOTE — Telephone Encounter (Signed)
[facility] has already scheduled patient

## 2020-01-20 NOTE — Progress Notes (Signed)
Dr. Lucky Cowboy at bedside, speaking to patient and caregiver:  Kenneth Luna  Regarding procedure, both verbalize understanding of procedure, follow up.

## 2020-01-20 NOTE — Telephone Encounter (Signed)
That's fine

## 2020-01-20 NOTE — Op Note (Signed)
[facility]   OPERATIVE NOTE   PRE-OPERATIVE DIAGNOSIS: extensive RLE DVT  POST-OPERATIVE DIAGNOSIS: same   PROCEDURE: 1.   US guidance for vascular access to right popliteal vein 2.   Catheter placement into right common iliac vein from right popliteal approach 3.   IVC gram and right leg venogram 4.   Mechanical thrombectomy to right superficial femoral vein, common femoral vein, external and common iliac veins with the penumbra CAT 12 device 5.   PTA of right common femoral vein with 12 and 14 mm balloons 6.   PTA of right external and common iliac veins with 12 and 14 mm balloon    SURGEON: [name], MD  ASSISTANT(S): none  ANESTHESIA: local with moderate conscious sedation for 41 minutes using 3 mg of Versed and 75 mcg of Fentanyl  ESTIMATED BLOOD LOSS: 250 cc  FINDING(S): 1.   Extensive right lower extremity DVT in the superficial femoral vein and common femoral vein as well as occlusion of the iliac veins with both thrombus and stenosis  SPECIMEN(S):  none  INDICATIONS:    Patient is a 86 y.o. male who presents with extensive RLE.  Patient has marked leg swelling and pain.  Venous intervention is performed to reduce the symtpoms and avoid long term postphlebitic symptoms.    DESCRIPTION: After obtaining full informed written consent, the patient was brought back to the vascular suite and placed supine upon the table. Moderate conscious sedation was administered during a face to face encounter with the patient throughout the procedure with my supervision of the RN administering medicines and monitoring the patient's vital signs, pulse oximetry, telemetry and mental status throughout from the start of the procedure until the patient was taken to the recovery room.  After obtaining adequate anesthesia, the patient was prepped and draped in the standard fashion.    The patient was then placed into the prone position.  The right popliteal vein was then  accessed under direct ultrasound guidance without difficulty with a micropuncture needle and a permanent image was recorded.  I then upsized to an 11Fr sheath over a J wire.  3000 units of heparin were then given.  Imaging showed extensive DVT with minimal flow starting in the mid to proximal superficial femoral vein.  A Kumpe catheter and Magic tourque wire were then advanced into the CFV and images were performed.   There was no flow in the iliac veins.  I was able to cross the thrombus and stenosis and advance into the common iliac vein which was small but did have flow.   Thrombolytic therapy was not used due to his recent surgical procedure and his age.  I used the Penumbra Cat 12 catheter and evacuated about 150 cc of effluent with mechanical thrombectomy throughout the iliac veins, CFV, and SFV.  This cleared the thrombus in the superficial femoral vein but the common femoral vein remained highly narrowed.  The iliac veins now did have some flow but they remain narrowed with some thrombus.  I then treated the CFV with 12 mm diameter angioplasty balloon to open a channel.   I also advance the 12 mm diameter by 8 cm length balloon up into the external and common iliac veins with 2 inflations there.  Each of these inflations were about 6 atm for 1 minute.  This resulted in some resolution of the thrombus and improved flow but narrowing remained in both the iliac veins and the common femoral vein.  I  then ran the penumbra CAT 12 catheter again.  Large chunks of thrombus were removed and following this there was not a lot of residual thrombus but there remained stenosis.  I then upsized to a 14 mm diameter by 6 cm length balloon and inflated this in the common femoral vein and the distal external iliac vein up to 4 atm for 1 minute.  Was then advanced up and treated the iliac veins with 2 inflations to 4 atm for 1 minute.  Completion imaging showed markedly improved flow.  There was no significant residual  thrombus.  There is only about a 30% residual stenosis in the common femoral vein and distal external iliac vein.  The degree of stenosis in the common iliac vein was approaching 50%, but did not appear high-grade and we really did not have an appropriate stent to treat this today.  I then elected to terminate the procedure.  The sheath was removed and a dressing was placed.  She was taken to the recovery room in stable condition having tolerated the procedure well.    COMPLICATIONS: None  CONDITION: Stable  [name] 01/20/2020 12:01 PM

## 2020-01-20 NOTE — Interval H&P Note (Signed)
History and Physical Interval Note:  01/20/2020 9:37 AM  Kenneth Luna.  has presented today for surgery, with the diagnosis of RT leg thrombectomy   DVT Covid  Sept 30.  The various methods of treatment have been discussed with the patient and family. After consideration of risks, benefits and other options for treatment, the patient has consented to  Procedure(s): PERIPHERAL VASCULAR THROMBECTOMY (Right) as a surgical intervention.  The patient's history has been reviewed, patient examined, no change in status, stable for surgery.  I have reviewed the patient's chart and labs.  Questions were answered to the patient's satisfaction.     [name]

## 2020-01-20 NOTE — Telephone Encounter (Signed)
caregiver called and stated "aint nobody coming to your office at 7:30 in the morming and requested that the appt be rescheduled. I gave her the next available that fit the time frame she wanted to come which ended up being 11/9. She questioned whether Dr. Lucky Cowboy would be ok with that date I advised that I did not know and that it would prolly be best that she stay within the parameter that was requested. She called back and requested to be put back in the 7:30 slot until doctor can be questioned. Please advise

## 2020-01-21 ENCOUNTER — Encounter: Payer: Self-pay | Admitting: Vascular Surgery

## 2020-01-21 NOTE — Telephone Encounter (Signed)
Per  the NP its fine.

## 2020-02-03 ENCOUNTER — Telehealth (INDEPENDENT_AMBULATORY_CARE_PROVIDER_SITE_OTHER): Payer: Self-pay

## 2020-02-03 NOTE — Telephone Encounter (Signed)
Ms Holley Raring has being made aware with medical advice and will make the patient aware

## 2020-02-03 NOTE — Telephone Encounter (Signed)
Following thrombectomy the patient can continue to have some swelling present.  Depending on the extent of the swelling initially it may take some time for the swelling to go down.  The patient should be sure to be taking his eliquis as prescribed, his leg should be elevated above his heart and compression should be worn daily (from the time you wake to before bed, do not sleep in stockings). If the tylenol is not helping for pain we can give him some tramadol

## 2020-02-11 ENCOUNTER — Encounter (INDEPENDENT_AMBULATORY_CARE_PROVIDER_SITE_OTHER): Payer: Medicare Other

## 2020-02-11 ENCOUNTER — Ambulatory Visit (INDEPENDENT_AMBULATORY_CARE_PROVIDER_SITE_OTHER): Payer: Medicare Other | Admitting: Vascular Surgery

## 2020-02-12 ENCOUNTER — Other Ambulatory Visit (INDEPENDENT_AMBULATORY_CARE_PROVIDER_SITE_OTHER): Payer: Self-pay | Admitting: Vascular Surgery

## 2020-02-12 DIAGNOSIS — Z9862 Peripheral vascular angioplasty status: Secondary | ICD-10-CM

## 2020-02-12 DIAGNOSIS — I82411 Acute embolism and thrombosis of right femoral vein: Secondary | ICD-10-CM

## 2020-02-14 ENCOUNTER — Other Ambulatory Visit: Payer: Self-pay

## 2020-02-14 ENCOUNTER — Encounter (INDEPENDENT_AMBULATORY_CARE_PROVIDER_SITE_OTHER): Payer: Self-pay | Admitting: Nurse Practitioner

## 2020-02-14 ENCOUNTER — Ambulatory Visit (INDEPENDENT_AMBULATORY_CARE_PROVIDER_SITE_OTHER): Payer: Medicare Other

## 2020-02-14 ENCOUNTER — Ambulatory Visit (INDEPENDENT_AMBULATORY_CARE_PROVIDER_SITE_OTHER): Payer: Medicare Other | Admitting: Nurse Practitioner

## 2020-02-14 VITALS — BP 115/69 | HR 80 | Resp 16 | Wt 139.6 lb

## 2020-02-14 DIAGNOSIS — I10 Essential (primary) hypertension: Secondary | ICD-10-CM

## 2020-02-14 DIAGNOSIS — Z9862 Peripheral vascular angioplasty status: Secondary | ICD-10-CM | POA: Diagnosis not present

## 2020-02-14 DIAGNOSIS — I82411 Acute embolism and thrombosis of right femoral vein: Secondary | ICD-10-CM

## 2020-02-16 ENCOUNTER — Encounter (INDEPENDENT_AMBULATORY_CARE_PROVIDER_SITE_OTHER): Payer: Self-pay | Admitting: Nurse Practitioner

## 2020-02-16 NOTE — Progress Notes (Signed)
Subjective:    Patient ID: Kenneth Bees., male    DOB: Feb 05, 1933, 86 y.o.   MRN: [id number] Chief Complaint  Patient presents with  . Follow-up    [facility] 3wk ultrasound follow up    The patient presents today after right lower extremity thrombectomy on 01/20/2020.  His thrombus initially occurred following intervention for a pacemaker placement.  The patient has severe swelling and pain.  Following his thrombectomy the patient notes that the swelling has decreased greatly however some still remains in his thigh area.  The patient is doing well with continuing with anticoagulation.  He denies any postphlebitic symptoms.  He denies any chest pain or shortness of breath.  Overall he is doing well.  Today noninvasive studies show no evidence of deep vein thrombosis in the right lower extremity.  No evidence of left common femoral vein obstruction.   Review of Systems  Cardiovascular: Positive for leg swelling.  Hematological: Bruises/bleeds easily.  All other systems reviewed and are negative.      Objective:   Physical Exam Vitals reviewed.  HENT:     Head: Normocephalic.  Pulmonary:     Effort: Pulmonary effort is normal.  Musculoskeletal:     Right lower leg: Edema present.  Skin:    General: Skin is warm and dry.  Neurological:     Mental Status: He is alert and oriented to person, place, and time. Mental status is at baseline.     Motor: Weakness present.     Gait: Gait abnormal.  Psychiatric:        Mood and Affect: Mood normal.        Behavior: Behavior normal.        Thought Content: Thought content normal.        Judgment: Judgment normal.     BP 115/69 (BP Location: Right Arm)   Pulse 80   Resp 16   Wt 139 lb 9.6 oz (63.3 kg)   BMI 22.53 kg/m   Past Medical History:  Diagnosis Date  . Allergic rhinitis   . Anemia   . B12 deficiency 06/29/2015  . Benign neoplasm of ascending colon   . Benign neoplasm of descending colon   . Colon polyp   .  Colostomy in place [facility])   . DD (diverticular disease) 06/29/2015  . Dementia ([place])    memory issues  . Fatty liver   . GERD (gastroesophageal reflux disease)   . Glaucoma   . Hyperlipidemia   . Hypertension   . Rectal adenocarcinoma ([place]) 07/15/2015  . Substance abuse ([place])    alcohol  . Traumatic intraparenchymal hemorrhage (HCC)     Social History   Socioeconomic History  . Marital status: Widowed    Spouse name: Not on file  . Number of children: Not on file  . Years of education: Not on file  . Highest education level: Not on file  Occupational History  . Occupation: retired  Tobacco Use  . Smoking status: Former Smoker    Types: Pipe    Quit date: 04/18/1978    Years since quitting: 41.8  . Smokeless tobacco: Never Used  Vaping Use  . Vaping Use: Never used  Substance and Sexual Activity  . Alcohol use: Yes    Alcohol/week: 28.0 standard drinks    Types: 28 Glasses of wine per week    Comment: 4 glass of wine daily  . Drug use: No  . Sexual activity: Not on file  Other Topics  Concern  . Not on file  Social History Narrative  . Not on file   Social Determinants of Health   Financial Resource Strain:   . Difficulty of Paying Living Expenses: Not on file  Food Insecurity:   . Worried About Charity fundraiser in the Last Year: Not on file  . Ran Out of Food in the Last Year: Not on file  Transportation Needs:   . Lack of Transportation (Medical): Not on file  . Lack of Transportation (Non-Medical): Not on file  Physical Activity:   . Days of Exercise per Week: Not on file  . Minutes of Exercise per Session: Not on file  Stress:   . Feeling of Stress : Not on file  Social Connections:   . Frequency of Communication with Friends and Family: Not on file  . Frequency of Social Gatherings with Friends and Family: Not on file  . Attends Religious Services: Not on file  . Active Member of Clubs or Organizations: Not on file  . Attends Archivist  Meetings: Not on file  . Marital Status: Not on file  Intimate Partner Violence:   . Fear of Current or Ex-Partner: Not on file  . Emotionally Abused: Not on file  . Physically Abused: Not on file  . Sexually Abused: Not on file    Past Surgical History:  Procedure Laterality Date  . ABDOMINOPERINEAL PROCTOCOLECTOMY  12/17/2015   [facility]  . CATARACT EXTRACTION W/ INTRAOCULAR LENS IMPLANT    . COLONOSCOPY WITH PROPOFOL N/A 07/10/2015   Procedure: COLONOSCOPY WITH PROPOFOL;  Surgeon: Lucilla Lame, MD;  Location: [place];  Service: Endoscopy;  Laterality: N/A;  PER CAREGIVER WAS TOLD THAT PT WOULD BE 1ST (KEEP PT EARLY AM)  . COLONOSCOPY WITH PROPOFOL N/A 04/24/2017   Procedure: COLONOSCOPY WITH PROPOFOL;  Surgeon: Lucilla Lame, MD;  Location: [place];  Service: Endoscopy;  Laterality: N/A;  . INSERT / REPLACE / REMOVE PACEMAKER  01/08/2020  . PACEMAKER INSERTION Left 01/07/2020   Procedure: INSERTION PACEMAKER;  Surgeon: Isaias Cowman, MD;  Location: [facility];  Service: Cardiovascular;  Laterality: Left;  . PACEMAKER LEADLESS INSERTION N/A 01/07/2020   Procedure: PACEMAKER LEADLESS INSERTION;  Surgeon: Isaias Cowman, MD;  Location: [place] [facility];  Service: Cardiovascular;  Laterality: N/A;  . PERIPHERAL VASCULAR BALLOON ANGIOPLASTY N/A 01/07/2020   Procedure: PERIPHERAL VASCULAR BALLOON ANGIOPLASTY;  Surgeon: Algernon Huxley, MD;  Location: [place] [facility];  Service: Cardiovascular;  Laterality: N/A;  . PERIPHERAL VASCULAR BALLOON ANGIOPLASTY N/A 01/07/2020   Procedure: PERIPHERAL VASCULAR BALLOON ANGIOPLASTY;  Surgeon: Katha Cabal, MD;  Location: [place] [facility];  Service: Cardiovascular;  Laterality: N/A;  . PERIPHERAL VASCULAR THROMBECTOMY Right 01/20/2020   Procedure: PERIPHERAL VASCULAR THROMBECTOMY;  Surgeon: Algernon Huxley, MD;  Location: [place] [facility];  Service: Cardiovascular;  Laterality: Right;  . POLYPECTOMY  07/10/2015    Procedure: POLYPECTOMY;  Surgeon: Lucilla Lame, MD;  Location: [place];  Service: Endoscopy;;    Family History  Problem Relation Age of Onset  . Cancer Father        Prostate    No Known Allergies  CBC Latest Ref Rng & Units 01/08/2020 10/28/2019 05/03/2019  WBC 4.0 - 10.5 K/uL 8.2 5.0 4.3  Hemoglobin 13.0 - 17.0 g/dL 12.9(L) 13.8 14.1  Hematocrit 39 - 52 % 36.8(L) 39.6 42.7  Platelets 150 - 400 K/uL 168 210 251      CMP  Component Value Date/Time   NA 130 (L) 01/08/2020 0503   K 4.1 01/08/2020 0503   CL 96 (L) 01/08/2020 0503   CO2 24 01/08/2020 0503   GLUCOSE 105 (H) 01/08/2020 0503   BUN 11 01/20/2020 1008   CREATININE 0.62 01/20/2020 1008   CALCIUM 9.3 01/08/2020 0503   PROT 6.8 10/28/2019 0901   ALBUMIN 3.9 10/28/2019 0901   AST 31 10/28/2019 0901   ALT 24 10/28/2019 0901   ALKPHOS 61 10/28/2019 0901   BILITOT 0.8 10/28/2019 0901   GFRNONAA >60 01/20/2020 1008   GFRAA >60 01/20/2020 1008          Assessment & Plan:   1. Deep vein thrombosis (DVT) of femoral vein of right lower extremity, unspecified chronicity (HCC) Today noninvasive study showed no evidence of residual thrombus in the right lower extremity.  The patient will continue with anticoagulation, not for his thrombus but for his cardiovascular issues.  Further treatment with anticoagulation will be deferred to his cardiologist.  Patient is also advised to continue to utilize medical grade 1 compression stockings daily to help with swelling and possible postphlebitic symptoms.  Barring any issues the patient will follow up with [place] in 1 year.  2. Essential (primary) hypertension Continue antihypertensive medications as already ordered, these medications have been reviewed and there are no changes at this time.    Current Outpatient Medications on File Prior to Visit  Medication Sig Dispense Refill  . apixaban (ELIQUIS) 5 MG TABS tablet Take 5 mg by mouth 2 (two) times daily.    . B  Complex-Biotin-FA (B-100 B-COMPLEX) TABS Take 1 tablet by mouth daily.    . brimonidine-timolol (COMBIGAN) 0.2-0.5 % ophthalmic solution Place 1 drop into both eyes 2 (two) times daily.    . cetirizine (ZYRTEC) 10 MG tablet Take 10 mg by mouth daily.     Marland Kitchen docusate sodium (COLACE) 100 MG capsule Take 100 mg by mouth daily.    Marland Kitchen donepezil (ARICEPT) 10 MG tablet Take 10 mg by mouth at bedtime.    . fluticasone (FLONASE) 50 MCG/ACT nasal spray Place 2 sprays into both nostrils daily as needed for allergies or rhinitis.     . furosemide (LASIX) 20 MG tablet Take 20 mg by mouth every other day.    . gabapentin (NEURONTIN) 100 MG capsule Take 100 mg by mouth 2 (two) times daily.     . montelukast (SINGULAIR) 10 MG tablet Take 10 mg by mouth at bedtime.     . Multiple Vitamin (MULTIVITAMIN) tablet Take 1 tablet by mouth daily.    . Multiple Vitamins-Minerals (ICAPS AREDS 2 PO) Take 1 capsule by mouth in the morning and at bedtime.     Marland Kitchen omeprazole (PRILOSEC) 20 MG capsule Take 1 capsule (20 mg total) by mouth daily. 30 capsule 0  . cephALEXin (KEFLEX) 250 MG capsule Take 2 capsules (500 mg total) by mouth 2 (two) times daily. (Patient not taking: Reported on 01/20/2020) 14 capsule 0  . losartan (COZAAR) 25 MG tablet Take 25 mg by mouth daily.      No current facility-administered medications on file prior to visit.    There are no Patient Instructions on file for this visit. No follow-ups on file.   Kris Hartmann, NP

## 2020-02-25 ENCOUNTER — Encounter (INDEPENDENT_AMBULATORY_CARE_PROVIDER_SITE_OTHER): Payer: Medicare Other

## 2020-02-25 ENCOUNTER — Ambulatory Visit (INDEPENDENT_AMBULATORY_CARE_PROVIDER_SITE_OTHER): Payer: Medicare Other | Admitting: Vascular Surgery

## 2020-04-29 ENCOUNTER — Inpatient Hospital Stay: Payer: Medicare Other | Attending: Hematology and Oncology

## 2020-04-29 ENCOUNTER — Other Ambulatory Visit: Payer: Self-pay

## 2020-04-29 DIAGNOSIS — D509 Iron deficiency anemia, unspecified: Secondary | ICD-10-CM | POA: Insufficient documentation

## 2020-04-29 DIAGNOSIS — Z7901 Long term (current) use of anticoagulants: Secondary | ICD-10-CM | POA: Diagnosis not present

## 2020-04-29 DIAGNOSIS — Z923 Personal history of irradiation: Secondary | ICD-10-CM | POA: Diagnosis not present

## 2020-04-29 DIAGNOSIS — E538 Deficiency of other specified B group vitamins: Secondary | ICD-10-CM | POA: Diagnosis not present

## 2020-04-29 DIAGNOSIS — F0390 Unspecified dementia without behavioral disturbance: Secondary | ICD-10-CM | POA: Insufficient documentation

## 2020-04-29 DIAGNOSIS — Z7289 Other problems related to lifestyle: Secondary | ICD-10-CM | POA: Diagnosis not present

## 2020-04-29 DIAGNOSIS — Z87891 Personal history of nicotine dependence: Secondary | ICD-10-CM | POA: Diagnosis not present

## 2020-04-29 DIAGNOSIS — Z86010 Personal history of colonic polyps: Secondary | ICD-10-CM | POA: Insufficient documentation

## 2020-04-29 DIAGNOSIS — Z950 Presence of cardiac pacemaker: Secondary | ICD-10-CM | POA: Diagnosis not present

## 2020-04-29 DIAGNOSIS — Z933 Colostomy status: Secondary | ICD-10-CM | POA: Insufficient documentation

## 2020-04-29 DIAGNOSIS — Z9221 Personal history of antineoplastic chemotherapy: Secondary | ICD-10-CM | POA: Insufficient documentation

## 2020-04-29 DIAGNOSIS — C20 Malignant neoplasm of rectum: Secondary | ICD-10-CM

## 2020-04-29 DIAGNOSIS — G8929 Other chronic pain: Secondary | ICD-10-CM | POA: Insufficient documentation

## 2020-04-29 DIAGNOSIS — Z85048 Personal history of other malignant neoplasm of rectum, rectosigmoid junction, and anus: Secondary | ICD-10-CM | POA: Insufficient documentation

## 2020-04-29 DIAGNOSIS — Z86718 Personal history of other venous thrombosis and embolism: Secondary | ICD-10-CM | POA: Diagnosis not present

## 2020-04-29 LAB — COMPREHENSIVE METABOLIC PANEL
ALT: 20 U/L (ref 0–44)
AST: 30 U/L (ref 15–41)
Albumin: 3.9 g/dL (ref 3.5–5.0)
Alkaline Phosphatase: 73 U/L (ref 38–126)
Anion gap: 6 (ref 5–15)
BUN: 19 mg/dL (ref 8–23)
CO2: 29 mmol/L (ref 22–32)
Calcium: 9.8 mg/dL (ref 8.9–10.3)
Chloride: 97 mmol/L — ABNORMAL LOW (ref 98–111)
Creatinine, Ser: 0.77 mg/dL (ref 0.61–1.24)
GFR, Estimated: >60 mL/min (ref >60)
Glucose, Bld: 73 mg/dL (ref 70–99)
Potassium: 4.5 mmol/L (ref 3.5–5.1)
Sodium: 132 mmol/L — ABNORMAL LOW (ref 135–145)
Total Bilirubin: 0.6 mg/dL (ref 0.3–1.2)
Total Protein: 7 g/dL (ref 6.5–8.1)

## 2020-04-29 LAB — CBC WITH DIFFERENTIAL/PLATELET
Abs Immature Granulocytes: 0.02 10*3/uL (ref 0.00–0.07)
Basophils Absolute: 0.1 10*3/uL (ref 0.0–0.1)
Basophils Relative: 1 %
Eosinophils Absolute: 0.2 10*3/uL (ref 0.0–0.5)
Eosinophils Relative: 3 %
HCT: 35.3 % — ABNORMAL LOW (ref 39.0–52.0)
Hemoglobin: 11.2 g/dL — ABNORMAL LOW (ref 13.0–17.0)
Immature Granulocytes: 0 %
Lymphocytes Relative: 19 %
Lymphs Abs: 1.2 10*3/uL (ref 0.7–4.0)
MCH: 24.9 pg — ABNORMAL LOW (ref 26.0–34.0)
MCHC: 31.7 g/dL (ref 30.0–36.0)
MCV: 78.4 fL — ABNORMAL LOW (ref 80.0–100.0)
Monocytes Absolute: 0.8 10*3/uL (ref 0.1–1.0)
Monocytes Relative: 12 %
Neutro Abs: 4.1 10*3/uL (ref 1.7–7.7)
Neutrophils Relative %: 65 %
Platelets: 223 10*3/uL (ref 150–400)
RBC: 4.5 MIL/uL (ref 4.22–5.81)
RDW: 15.9 % — ABNORMAL HIGH (ref 11.5–15.5)
WBC: 6.2 10*3/uL (ref 4.0–10.5)
nRBC: 0 % (ref 0.0–0.2)

## 2020-04-29 LAB — FERRITIN: Ferritin: 15 ng/mL — ABNORMAL LOW (ref 24–336)

## 2020-04-29 NOTE — Progress Notes (Signed)
Called and spoke to caregiver Glenda for patient to begin oral iron, ferrous sulfate with orange juice or vitamin C. She will inform us at next apt 1/19 if patient unable to tolerate oral iron. She verbalizes understanding.

## 2020-04-30 LAB — CEA: CEA: 2.9 ng/mL (ref 0.0–4.7)

## 2020-05-05 NOTE — Progress Notes (Signed)
[facility]  [address] [place], [place] [address] Phone: [phone]  Fax: [phone]   Office visit:  05/06/2020  Referring physician: Marisue Ivan, MD  Chief Complaint: Kenneth Luna. is a 87 y.o. male with stage IIC rectal cancer  who is seen for 6 month assessment.   HPI: The patient was last seen in the medical oncology clinic on 11/04/2019. At that time, he was doing well.  He voiced no concerns.  Exam was stable. Hematocrit was 39.6, hemoglobin 13.8, platelets 210,000, WBC 5,000. Sodium was 132. Ferritin was 39. CEA was 3.0. He continued oral B12.  We discussed continued surveillance with yearly scans.  The patient established care with Dr. Gwen Pounds on 11/15/2019 for persistent atrial fibrillation.  Holter monitor and echocardiogram were ordered. Leadless pacemaker access was aborted secondary to stenosis in the right femoral vein.  Single-chamber pacemaker implantation in the OR was performed on 01/07/2020.   After the procedure, the patient developed significant lower extremity swelling.  He has known venous stenosis in the femoral and iliac veins.  Right lower extremity venous duplex was positive for extensive occlusive DVT in right peroneal, popliteal, and femoral veins. He was started on Eliquis and underwent peripheral vascular thrombectomy on 01/20/2020 by Dr. Wyn Quaker.  The patient saw Sheppard Plumber, NP on 02/14/2020. Noninvasive studies showed no evidence of residual thrombus in the right lower extremity. He was to continue Eliquis for his cardiovascular issues. Follow up was planned for 1 year.  Labs on 04/29/2020 revealed a hematocrit of 35.3, hemoglobin 11.2, MCV 78.4, platelets 223,000, WBC 6,200. Sodium was 132. Ferritin was 15. CEA was 2.9. Patient was contacted and instructed to start oral iron.  He began oral iron once daily with orange juice on 04/29/2020. He denies bleeding of any kind. He will try to take it BID in  order to avoid IV iron. He takes Miralax.  During the interim, he has been "pretty good." He denies any symptoms head to toe, though he does sleep a lot. His vision is stable. He is eating well and denies nausea, vomiting, and diarrhea. His balance is pretty good. His leg swelling has resolved.   The patient is going to be on Eliquis for the rest of his life. He wears compression stockings.   Past Medical History:  Diagnosis Date  . Allergic rhinitis   . Anemia   . B12 deficiency 06/29/2015  . Benign neoplasm of ascending colon   . Benign neoplasm of descending colon   . Colon polyp   . Colostomy in place [facility])   . DD (diverticular disease) 06/29/2015  . Dementia (HCC)    memory issues  . Fatty liver   . GERD (gastroesophageal reflux disease)   . Glaucoma   . Hyperlipidemia   . Hypertension   . Rectal adenocarcinoma (HCC) 07/15/2015  . Substance abuse (HCC)    alcohol  . Traumatic intraparenchymal hemorrhage [facility])     Past Surgical History:  Procedure Laterality Date  . ABDOMINOPERINEAL PROCTOCOLECTOMY  12/17/2015   [facility]  . CATARACT EXTRACTION W/ INTRAOCULAR LENS IMPLANT    . COLONOSCOPY WITH PROPOFOL N/A 07/10/2015   Procedure: COLONOSCOPY WITH PROPOFOL;  Surgeon: Midge Minium, MD;  Location: [facility];  Service: Endoscopy;  Laterality: N/A;  PER CAREGIVER WAS TOLD THAT PT WOULD BE 1ST (KEEP PT EARLY AM)  . COLONOSCOPY WITH PROPOFOL N/A 04/24/2017   Procedure: COLONOSCOPY WITH PROPOFOL;  Surgeon: Midge Minium, MD;  Location: [facility];  Service: Endoscopy;  Laterality: N/A;  . INSERT / REPLACE / REMOVE PACEMAKER  01/08/2020  . PACEMAKER INSERTION Left 01/07/2020   Procedure: INSERTION PACEMAKER;  Surgeon: Isaias Cowman, MD;  Location: [facility];  Service: Cardiovascular;  Laterality: Left;  . PACEMAKER LEADLESS INSERTION N/A 01/07/2020   Procedure: PACEMAKER LEADLESS INSERTION;  Surgeon: Isaias Cowman, MD;  Location: [place] [facility];  Service:  Cardiovascular;  Laterality: N/A;  . PERIPHERAL VASCULAR BALLOON ANGIOPLASTY N/A 01/07/2020   Procedure: PERIPHERAL VASCULAR BALLOON ANGIOPLASTY;  Surgeon: Algernon Huxley, MD;  Location: [place] [facility];  Service: Cardiovascular;  Laterality: N/A;  . PERIPHERAL VASCULAR BALLOON ANGIOPLASTY N/A 01/07/2020   Procedure: PERIPHERAL VASCULAR BALLOON ANGIOPLASTY;  Surgeon: Katha Cabal, MD;  Location: [place] [facility];  Service: Cardiovascular;  Laterality: N/A;  . PERIPHERAL VASCULAR THROMBECTOMY Right 01/20/2020   Procedure: PERIPHERAL VASCULAR THROMBECTOMY;  Surgeon: Algernon Huxley, MD;  Location: [place] [facility];  Service: Cardiovascular;  Laterality: Right;  . POLYPECTOMY  07/10/2015   Procedure: POLYPECTOMY;  Surgeon: Lucilla Lame, MD;  Location: [place];  Service: Endoscopy;;    Family History  Problem Relation Age of Onset  . Cancer Father        Prostate    Social History:  reports that he quit smoking about 42 years ago. His smoking use included pipe. He has never used smokeless tobacco. He reports current alcohol use of about 28.0 standard drinks of alcohol per week. He reports that he does not use drugs. drinks 3 glasses of wine a day. The patient is widowed. He has no children. He drink 6 bottles of wine a week. His caregiver, [name], can be reached at [phone]). The patient is accompanied by his caregiver in person today.   Allergies: No Known Allergies  Current Medications: Current Outpatient Medications  Medication Sig Dispense Refill  . apixaban (ELIQUIS) 5 MG TABS tablet Take 5 mg by mouth 2 (two) times daily.    . B Complex-Biotin-FA (B-100 B-COMPLEX) TABS Take 1 tablet by mouth daily.    . brimonidine-timolol (COMBIGAN) 0.2-0.5 % ophthalmic solution Place 1 drop into both eyes 2 (two) times daily.    . cetirizine (ZYRTEC) 10 MG tablet Take 10 mg by mouth daily.     Marland Kitchen docusate sodium (COLACE) 100 MG capsule Take 100 mg by mouth daily.     Marland Kitchen donepezil (ARICEPT) 10 MG tablet Take 10 mg by mouth at bedtime.    . ferrous sulfate 325 (65 FE) MG tablet Take 325 mg by mouth daily with breakfast.    . fluticasone (FLONASE) 50 MCG/ACT nasal spray Place 2 sprays into both nostrils daily as needed for allergies or rhinitis.     . furosemide (LASIX) 20 MG tablet Take 20 mg by mouth every other day.    . gabapentin (NEURONTIN) 100 MG capsule Take 100 mg by mouth daily.    Marland Kitchen losartan (COZAAR) 25 MG tablet Take 25 mg by mouth daily.     . montelukast (SINGULAIR) 10 MG tablet Take 10 mg by mouth at bedtime.     . Multiple Vitamin (MULTIVITAMIN) tablet Take 1 tablet by mouth daily.    . Multiple Vitamins-Minerals (ICAPS AREDS 2 PO) Take 1 capsule by mouth in the morning and at bedtime.     Marland Kitchen omeprazole (PRILOSEC) 20 MG capsule Take 1 capsule (20 mg total) by mouth daily. 30 capsule 0   No current facility-administered medications for this visit.    Review  of Systems  Constitutional: Positive for malaise/fatigue (sleeps a lot). Negative for chills, diaphoresis, fever and weight loss (up 1 lb).       Doing "pretty good."  HENT: Positive for hearing loss. Negative for congestion, ear discharge, ear pain, nosebleeds, sinus pain and sore throat.   Eyes: Positive for blurred vision (macular degeneration, right eye). Negative for double vision, photophobia, pain and redness.       Receiving injections.  Respiratory: Negative.  Negative for cough, hemoptysis, sputum production, shortness of breath and wheezing.   Cardiovascular: Negative.  Negative for chest pain, palpitations, orthopnea, leg swelling and PND.  Gastrointestinal: Negative.  Negative for abdominal pain, blood in stool, constipation, diarrhea, heartburn, melena, nausea and vomiting.       Colostomy. Eating well.  Genitourinary: Negative.  Negative for dysuria, frequency, hematuria and urgency.  Musculoskeletal: Negative.  Negative for back pain, falls, joint pain, myalgias and neck  pain.  Skin: Negative.  Negative for itching and rash.  Neurological: Negative.  Negative for dizziness, tremors, sensory change, speech change, focal weakness, seizures, weakness and headaches.       Balance is pretty good.  Endo/Heme/Allergies: Negative.  Does not bruise/bleed easily.  Psychiatric/Behavioral: Positive for memory loss (dementia). Negative for depression. The patient is not nervous/anxious and does not have insomnia.   All other systems reviewed and are negative.  Performance status (ECOG):  1  Vitals Blood pressure 114/68, pulse 60, temperature (!) 96 F (35.6 C), temperature source Tympanic, resp. rate 16, weight 140 lb 3.4 oz (63.6 kg), SpO2 99 %.   Physical Exam Vitals and nursing note reviewed.  Constitutional:      General: He is not in acute distress.    Appearance: Normal appearance.     Interventions: Face mask in place.  HENT:     Head: Normocephalic and atraumatic.     Comments: Short gray hair.    Mouth/Throat:     Mouth: Mucous membranes are moist.     Pharynx: Oropharynx is clear.  Eyes:     General: No scleral icterus.    Extraocular Movements: Extraocular movements intact.     Conjunctiva/sclera: Conjunctivae normal.     Pupils: Pupils are equal, round, and reactive to light.     Comments: Glasses.  Blue eyes.  Cardiovascular:     Rate and Rhythm: Normal rate. Rhythm irregular.     Pulses: Normal pulses.     Heart sounds: Normal heart sounds. No murmur heard.     Comments: Pacemaker Pulmonary:     Effort: Pulmonary effort is normal. No respiratory distress.     Breath sounds: Normal breath sounds. No wheezing or rales.  Chest:     Chest wall: No tenderness.  Breasts:     Right: No axillary adenopathy or supraclavicular adenopathy.     Left: No axillary adenopathy or supraclavicular adenopathy.    Abdominal:     General: Bowel sounds are normal. There is no distension.     Palpations: Abdomen is soft. There is no mass.     Tenderness:  There is no abdominal tenderness. There is no guarding or rebound.     Hernia: A hernia (peristomal) is present.  Musculoskeletal:        General: No swelling or tenderness. Normal range of motion.     Cervical back: Normal range of motion and neck supple.     Right lower leg: Edema (trace) present.  Lymphadenopathy:     Head:     Right  side of head: No preauricular, posterior auricular or occipital adenopathy.     Left side of head: No preauricular, posterior auricular or occipital adenopathy.     Cervical: No cervical adenopathy.     Upper Body:     Right upper body: No supraclavicular or axillary adenopathy.     Left upper body: No supraclavicular or axillary adenopathy.     Lower Body: No right inguinal adenopathy. No left inguinal adenopathy.  Skin:    General: Skin is warm and dry.  Neurological:     Mental Status: He is alert and oriented to person, place, and time. Mental status is at baseline.     Deep Tendon Reflexes: Reflexes are normal and symmetric.  Psychiatric:        Mood and Affect: Mood normal.        Behavior: Behavior normal.        Thought Content: Thought content normal.        Judgment: Judgment normal.     Imaging studies: 07/27/2015:  Chest, abdomen, and pelvis CTon 07/27/2015 revealed a circumferential soft tissue mass within the low rectum. There was a T12 compression fracture.  07/26/2016:  Chest, abdomen, and pelvis CTon 07/26/2016 revealed smooth uniform soft tissue thickening in the presacral space, compatible with posttreatment change. There were no findings suspicious for local tumor recurrence in the pelvis. There was no evidence of metastatic disease in the chest, abdomen or pelvis. There was moderate superior L1 vertebral compression fracture of indeterminate chronicity, new since 08/08/2015.  06/16/2017:  Abdomen and pelvis CTrevealed small bowel herniated through the superior margin of the left lower quadrant colostomy defect, accounting for  the patient's swelling around his colostomy stoma. There was no evidence, however, a bowel obstruction, incarceration or strangulation. There was no evidence of locally recurrent or metastatic rectal carcinoma.  08/11/2017:  Chest CTrevealed no suspicious findings to suggest metastasis within the chest.  10/31/2018:  Chest, abdomen, and pelvis CT revealed unchanged postoperative findings status post abdominal perineal resection with left lower quadrant endostomy. There was a small parastomal hernia, status post repair. There was no evidence of metastatic disease in the chest, abdomen, or pelvis. 10/31/2019:  Chest, abdomen and pelvis CT revealed no findings of recurrent malignancy. Left lower quadrant peristomal hernia had enlarged and contained loops of small bowel without complicating feature. Other imaging findings of potential clinical significance: coronary atherosclerosis, small type 1 hiatal hernia, cholelithiasis, APR with left colostomy, stable 50% anterior compression fracture at L1 and 40% compression at T12, stable grade 1 anterolisthesis at L4-5 and L5-S1 with associated rib right foraminal impingement at both levels.   [facility] acuteright basal ganglia hemorrhageandleft humeral head fractures/p a fall on 07/21/2015.  [facility]. He was discharged home on Augmentin rather than Flagyl given his alcohol dependency.    No visits with results within 3 Day(s) from this visit.  Latest known visit with results is:  Clinical Support on 04/29/2020  Component Date Value Ref Range Status  . Ferritin 04/29/2020 15* 24 - 336 ng/mL Final   Performed at [facility], [address] [place], [place] [address]  . CEA 04/29/2020 2.9  0.0 - 4.7 ng/mL Final   Comment: (NOTE)                             Nonsmokers          <  3.9                             Smokers              <5.6 [employer] Electrochemiluminescence Immunoassay (ECLIA) Values obtained with different assay methods or kits cannot be used interchangeably.  Results cannot be interpreted as absolute evidence of the presence or absence of malignant disease. Performed At: [facility] [place], [place] [id number] Rush Farmer MD [id number]   . Sodium 04/29/2020 132* 135 - 145 mmol/L Final  . Potassium 04/29/2020 4.5  3.5 - 5.1 mmol/L Final  . Chloride 04/29/2020 97* 98 - 111 mmol/L Final  . CO2 04/29/2020 29  22 - 32 mmol/L Final  . Glucose, Bld 04/29/2020 73  70 - 99 mg/dL Final   Glucose reference range applies only to samples taken after fasting for at least 8 hours.  . BUN 04/29/2020 19  8 - 23 mg/dL Final  . Creatinine, Ser 04/29/2020 0.77  0.61 - 1.24 mg/dL Final  . Calcium 04/29/2020 9.8  8.9 - 10.3 mg/dL Final  . Total Protein 04/29/2020 7.0  6.5 - 8.1 g/dL Final  . Albumin 04/29/2020 3.9  3.5 - 5.0 g/dL Final  . AST 04/29/2020 30  15 - 41 U/L Final  . ALT 04/29/2020 20  0 - 44 U/L Final  . Alkaline Phosphatase 04/29/2020 73  38 - 126 U/L Final  . Total Bilirubin 04/29/2020 0.6  0.3 - 1.2 mg/dL Final  . GFR, Estimated 04/29/2020 >60  >60 mL/min Final   Comment: (NOTE) Calculated using the CKD-EPI Creatinine Equation (2021)   . Anion gap 04/29/2020 6  5 - 15 Final   Performed at [facility], [address] [place], [place] [address]  . WBC 04/29/2020 6.2  4.0 - 10.5 K/uL Final  . RBC 04/29/2020 4.50  4.22 - 5.81 MIL/uL Final  . Hemoglobin 04/29/2020 11.2* 13.0 - 17.0 g/dL Final  . HCT 04/29/2020 35.3* 39.0 - 52.0 % Final  . MCV 04/29/2020 78.4* 80.0 - 100.0 fL Final  . MCH 04/29/2020 24.9* 26.0 - 34.0 pg Final  . MCHC 04/29/2020 31.7  30.0 - 36.0 g/dL Final  . RDW 04/29/2020 15.9* 11.5 - 15.5 % Final  . Platelets 04/29/2020 223  150 - 400 K/uL Final  . nRBC 04/29/2020 0.0  0.0 - 0.2 % Final  . Neutrophils Relative % 04/29/2020 65  %  Final  . Neutro Abs 04/29/2020 4.1  1.7 - 7.7 K/uL Final  . Lymphocytes Relative 04/29/2020 19  % Final  . Lymphs Abs 04/29/2020 1.2  0.7 - 4.0 K/uL Final  . Monocytes Relative 04/29/2020 12  % Final  . Monocytes Absolute 04/29/2020 0.8  0.1 - 1.0 K/uL Final  . Eosinophils Relative 04/29/2020 3  % Final  . Eosinophils Absolute 04/29/2020 0.2  0.0 - 0.5 K/uL Final  . Basophils Relative 04/29/2020 1  % Final  . Basophils Absolute 04/29/2020 0.1  0.0 - 0.1 K/uL Final  . Immature Granulocytes 04/29/2020 0  % Final  . Abs Immature Granulocytes 04/29/2020 0.02  0.00 - 0.07 K/uL Final   Performed at [facility], [address] [place], [place] [address]    Assessment:  Ch[place] Cawood. is a 87 y.o. male with stage IIC (T4bN0M0) low rectal adenocarcinoma s/p neoadjuvant chemotherapyand radiationfollowed by surgery. He presented with rectal bleeding. Colonoscopy on 07/10/2015 revealed a 4  cm frond-like/villous ulcerated non-obstructing mass in the rectum. Biopsy revealed adenocarcinoma.  Chest, abdomen, and pelvic CTscan on 07/27/2015 revealed a circumferential soft tissue mass within the low rectum. There was a T12 compression fracture. CEAwas 4.1 on 07/26/2015.  Flexible sigmoidoscopyand ultrasound on 08/19/2015 revealed a 3.0 x 1.4 cm hypoechoic non-circumferential mass was found in the rectum. There was no sonographic evidence suggesting breakthrough of the muscularis propria with invasion into the perirectal fat. No lymph nodes were seen in the perirectal region and in the left iliac region. Stage was early T3uN0.  He received 5 weeks of concurrent Xeloda and radiation(09/09/2015 - 10/22/2015). He underwent robotic assisted APRwith end col[facility] on 12/17/2015 at [facility]. Pathology revealed ypT4bN0M0 (stage IIC) invasive moderately differentiated adenocarcinoma. Levator muscle was involved.  He received 6 cycles of adjuvant Xeloda(02/15/2016 - 06/06/2016).  Cycle #3 was truncated after 1 week secondary to palmar plantar erythrodysesthesia (hand foot syndrome). Xeloda was dose reduced with cycle #4.  [facility]  Chest, abdomen, pelvis CT on 10/31/2019 revealed no findings of recurrent malignancy. Left lower quadrant peristomal hernia had enlarged and contained loops of small bowel without complicating feature.   CEAhas been followed: 4.1 on 07/26/2015, 2.6 on 02/01/2016, 2.9 on 07/25/2016, 2.9 on 10/28/2016, 3.1 on 01/27/2017, 2.9 on 04/28/2017, 4.3 on 08/30/2017, 3.2 on 01/01/2018, 3.1 on 05/07/2018, 3.3 on 11/05/2018, 3.0 on 05/03/2019, 3.0 on 10/28/2019, and 2.9 on 04/29/2020.   Colonoscopyon 04/24/2017 revealed a 3 mm polpy in the ascending colon (tubular adenoma). He had diverticulosis throughout the colon.  He has iron deficiencyanemia. Anemia workup on 03/07/2016 revealed a ferritin of 13, iron saturation 5% and TIBC 469 (high). B12 was 501. Folate was >47. He takes oral B12 daily. B12 was 430 on 01/01/2018. Dietis good. Ferritin was 43 on 04/19/2016, 99 on 07/25/2016, 50 on 10/28/2016, 62 on 04/28/2017, 45 on 01/01/2018, 33 on 11/05/2018, 39 on 10/28/2019, and 15 on 04/29/2020. He discontinued oral iron on 07/29/2016. Oral iron was restarted on 04/29/2020.  He developed a RLE DVT followed right femoral venous access for a pacemaker placement on 01/08/2020.  Right lower extremity duplex was positive for extensive occlusive DVT in right peroneal, popliteal, and femoral veins. He was started on Eliquis and underwent peripheral vascular thrombectomy on 01/20/2020.  He drinksalcohol daily(6 bottles/week). He has mild dementia.   Symptomatically, he fels "pretty good."  He denies any symptoms head to toe, though he does sleep a lot.  He denies any bleeding.  Exam is stable.   Plan: 1.   Review labs from 04/29/2020. 2. Stage IIC rectal carcinoma Clinically, he  continues to do well .             Exam feels no evidence of recurrent disease.             Chest, abdomen and pelvis CT on 10/31/2019 revealed no evidence of recurrent disease.               CEA was 2.9 on 04/29/2020.            Continue yearly surveillance scans. 3. Intermittent increased LFTs, resolved AST 30. ALT 20.  Continue to monitor. 4. Iron deficiency anemia Hematocrit 35.3.  Hemoglobin 11.2.  MCV 78.4 on 04/29/2020.     Ferritin 15.               Patient began oral iron on 04/29/2020.  Patient denies any bleeding. 5. B12 deficiency B12 was 537 and folate 60 on 11/05/2018.             He continues oral B12.  Monitor B12 and folate annually. 6.   Right lower extremity DVT  He developed a DVT following right femoral access for pacemaker on 01/08/2020.  He required thrombectomy on 01/20/2020.  He is on Eliquis. 7.   RTC in 4-6 weeks for labs (CBC, ferritin, B12, folate, Factor V Leiden, prothrombin gene mutation, lupus anticoagulant panel, anticardiolipin antibodies, beta2-glycoprotein antibodies). 8.   Chest, abdomen, and pelvis CT on 10/30/2020. 9.   Labs prior to scan- CBC with diff, CMP, ferritin, CEA. 10.   RTC after CT scans for MD assessment and review of labs and imaging.  I discussed the assessment and treatment plan with the patient.  The patient was provided an opportunity to ask questions and all were answered.  The patient agreed with the plan and demonstrated an understanding of the instructions.  The patient was advised to call back if the symptoms worsen or if the condition fails to improve as anticipated.  I provided 16 minutes of face-to-face time during this this encounter and > 50% was spent counseling as documented under my assessment and plan.  An additional 10-15 minutes were spent reviewing his chart (Epic and Care Everywhere) including notes, labs, and imaging studies.    Lequita Asal, MD, PhD    05/06/2020, 10:41 AM  I, Mirian Mo Tufford, am acting as Education administrator for [employer]. Mike Gip, MD, PhD.  I, Zanai Mallari C. Mike Gip, MD, have reviewed the above documentation for accuracy and completeness, and I agree with the above.

## 2020-05-06 ENCOUNTER — Other Ambulatory Visit: Payer: Medicare Other

## 2020-05-06 ENCOUNTER — Inpatient Hospital Stay: Payer: Medicare Other | Admitting: Hematology and Oncology

## 2020-05-06 ENCOUNTER — Other Ambulatory Visit: Payer: Self-pay

## 2020-05-06 ENCOUNTER — Encounter: Payer: Self-pay | Admitting: Hematology and Oncology

## 2020-05-06 VITALS — BP 114/68 | HR 60 | Temp 96.0°F | Resp 16 | Wt 140.2 lb

## 2020-05-06 DIAGNOSIS — C20 Malignant neoplasm of rectum: Secondary | ICD-10-CM

## 2020-05-06 DIAGNOSIS — I82411 Acute embolism and thrombosis of right femoral vein: Secondary | ICD-10-CM

## 2020-05-06 DIAGNOSIS — E538 Deficiency of other specified B group vitamins: Secondary | ICD-10-CM | POA: Diagnosis not present

## 2020-05-06 DIAGNOSIS — D509 Iron deficiency anemia, unspecified: Secondary | ICD-10-CM | POA: Diagnosis not present

## 2020-05-06 DIAGNOSIS — Z85048 Personal history of other malignant neoplasm of rectum, rectosigmoid junction, and anus: Secondary | ICD-10-CM | POA: Diagnosis not present

## 2020-05-06 NOTE — Patient Instructions (Signed)
  Increase iron to 1 pill twice a day with orange juice of vitamin C.

## 2020-06-10 ENCOUNTER — Inpatient Hospital Stay: Payer: Medicare Other | Attending: Hematology and Oncology

## 2020-06-10 DIAGNOSIS — Z7901 Long term (current) use of anticoagulants: Secondary | ICD-10-CM | POA: Diagnosis not present

## 2020-06-10 DIAGNOSIS — C20 Malignant neoplasm of rectum: Secondary | ICD-10-CM | POA: Diagnosis not present

## 2020-06-10 DIAGNOSIS — E538 Deficiency of other specified B group vitamins: Secondary | ICD-10-CM

## 2020-06-10 DIAGNOSIS — D509 Iron deficiency anemia, unspecified: Secondary | ICD-10-CM | POA: Diagnosis present

## 2020-06-10 DIAGNOSIS — Z86718 Personal history of other venous thrombosis and embolism: Secondary | ICD-10-CM | POA: Insufficient documentation

## 2020-06-10 LAB — CBC
HCT: 44.9 % (ref 39.0–52.0)
Hemoglobin: 14.6 g/dL (ref 13.0–17.0)
MCH: 28.2 pg (ref 26.0–34.0)
MCHC: 32.5 g/dL (ref 30.0–36.0)
MCV: 86.7 fL (ref 80.0–100.0)
Platelets: 209 10*3/uL (ref 150–400)
RBC: 5.18 MIL/uL (ref 4.22–5.81)
WBC: 5.4 10*3/uL (ref 4.0–10.5)
nRBC: 0 % (ref 0.0–0.2)

## 2020-06-10 LAB — FOLATE: Folate: 38 ng/mL (ref >5.9)

## 2020-06-10 LAB — VITAMIN B12: Vitamin B-12: 415 pg/mL (ref 180–914)

## 2020-06-10 LAB — FERRITIN: Ferritin: 76 ng/mL (ref 24–336)

## 2020-06-11 LAB — LUPUS ANTICOAGULANT PANEL
DRVVT: 42 s (ref 0.0–47.0)
PTT Lupus Anticoagulant: 43.5 s (ref 0.0–51.9)

## 2020-06-12 LAB — CARDIOLIPIN ANTIBODIES, IGG, IGM, IGA
Anticardiolipin IgA: 9 APL U/mL (ref 0–11)
Anticardiolipin IgG: <9 GPL U/mL (ref 0–14)
Anticardiolipin IgM: 59 MPL U/mL — ABNORMAL HIGH (ref 0–12)

## 2020-06-12 LAB — BETA-2-GLYCOPROTEIN I ABS, IGG/M/A
Beta-2 Glyco I IgG: <9 GPI IgG units (ref 0–20)
Beta-2-Glycoprotein I IgA: <9 GPI IgA units (ref 0–25)
Beta-2-Glycoprotein I IgM: <9 GPI IgM units (ref 0–32)

## 2020-06-16 LAB — FACTOR 5 LEIDEN

## 2020-06-17 LAB — PROTHROMBIN GENE MUTATION

## 2020-06-22 ENCOUNTER — Telehealth: Payer: Self-pay | Admitting: Hematology and Oncology

## 2020-06-22 NOTE — Telephone Encounter (Signed)
Returned VM to Ritchey regarding lab work for the patient. Asked to return call to clinic for clarification of what she is requesting.

## 2020-10-28 ENCOUNTER — Other Ambulatory Visit: Payer: Self-pay | Admitting: *Deleted

## 2020-10-28 DIAGNOSIS — C20 Malignant neoplasm of rectum: Secondary | ICD-10-CM

## 2020-10-28 DIAGNOSIS — D509 Iron deficiency anemia, unspecified: Secondary | ICD-10-CM

## 2020-10-29 ENCOUNTER — Other Ambulatory Visit: Payer: Medicare Other

## 2020-10-29 ENCOUNTER — Inpatient Hospital Stay: Payer: Medicare Other | Attending: Oncology

## 2020-10-29 ENCOUNTER — Other Ambulatory Visit: Payer: Self-pay

## 2020-10-29 DIAGNOSIS — Z86010 Personal history of colonic polyps: Secondary | ICD-10-CM | POA: Diagnosis not present

## 2020-10-29 DIAGNOSIS — Z862 Personal history of diseases of the blood and blood-forming organs and certain disorders involving the immune mechanism: Secondary | ICD-10-CM | POA: Diagnosis not present

## 2020-10-29 DIAGNOSIS — Z952 Presence of prosthetic heart valve: Secondary | ICD-10-CM | POA: Insufficient documentation

## 2020-10-29 DIAGNOSIS — Z85048 Personal history of other malignant neoplasm of rectum, rectosigmoid junction, and anus: Secondary | ICD-10-CM | POA: Insufficient documentation

## 2020-10-29 DIAGNOSIS — Z7901 Long term (current) use of anticoagulants: Secondary | ICD-10-CM | POA: Diagnosis not present

## 2020-10-29 DIAGNOSIS — Z9221 Personal history of antineoplastic chemotherapy: Secondary | ICD-10-CM | POA: Insufficient documentation

## 2020-10-29 DIAGNOSIS — D6861 Antiphospholipid syndrome: Secondary | ICD-10-CM | POA: Diagnosis not present

## 2020-10-29 DIAGNOSIS — D509 Iron deficiency anemia, unspecified: Secondary | ICD-10-CM

## 2020-10-29 DIAGNOSIS — Z923 Personal history of irradiation: Secondary | ICD-10-CM | POA: Insufficient documentation

## 2020-10-29 DIAGNOSIS — Z86718 Personal history of other venous thrombosis and embolism: Secondary | ICD-10-CM | POA: Insufficient documentation

## 2020-10-29 DIAGNOSIS — Z933 Colostomy status: Secondary | ICD-10-CM | POA: Diagnosis not present

## 2020-10-29 DIAGNOSIS — Z7289 Other problems related to lifestyle: Secondary | ICD-10-CM | POA: Diagnosis not present

## 2020-10-29 DIAGNOSIS — C20 Malignant neoplasm of rectum: Secondary | ICD-10-CM

## 2020-10-29 LAB — COMPREHENSIVE METABOLIC PANEL
ALT: 25 U/L (ref 0–44)
AST: 34 U/L (ref 15–41)
Albumin: 4 g/dL (ref 3.5–5.0)
Alkaline Phosphatase: 75 U/L (ref 38–126)
Anion gap: 4 — ABNORMAL LOW (ref 5–15)
BUN: 15 mg/dL (ref 8–23)
CO2: 30 mmol/L (ref 22–32)
Calcium: 10.2 mg/dL (ref 8.9–10.3)
Chloride: 97 mmol/L — ABNORMAL LOW (ref 98–111)
Creatinine, Ser: 0.67 mg/dL (ref 0.61–1.24)
GFR, Estimated: >60 mL/min (ref >60)
Glucose, Bld: 91 mg/dL (ref 70–99)
Potassium: 5.1 mmol/L (ref 3.5–5.1)
Sodium: 131 mmol/L — ABNORMAL LOW (ref 135–145)
Total Bilirubin: 0.9 mg/dL (ref 0.3–1.2)
Total Protein: 6.8 g/dL (ref 6.5–8.1)

## 2020-10-29 LAB — CBC WITH DIFFERENTIAL/PLATELET
Abs Immature Granulocytes: 0.01 10*3/uL (ref 0.00–0.07)
Basophils Absolute: 0.1 10*3/uL (ref 0.0–0.1)
Basophils Relative: 1 %
Eosinophils Absolute: 0.2 10*3/uL (ref 0.0–0.5)
Eosinophils Relative: 3 %
HCT: 40.8 % (ref 39.0–52.0)
Hemoglobin: 14.1 g/dL (ref 13.0–17.0)
Immature Granulocytes: 0 %
Lymphocytes Relative: 24 %
Lymphs Abs: 1.2 10*3/uL (ref 0.7–4.0)
MCH: 33.8 pg (ref 26.0–34.0)
MCHC: 34.6 g/dL (ref 30.0–36.0)
MCV: 97.8 fL (ref 80.0–100.0)
Monocytes Absolute: 0.7 10*3/uL (ref 0.1–1.0)
Monocytes Relative: 14 %
Neutro Abs: 2.8 10*3/uL (ref 1.7–7.7)
Neutrophils Relative %: 58 %
Platelets: 178 10*3/uL (ref 150–400)
RBC: 4.17 MIL/uL — ABNORMAL LOW (ref 4.22–5.81)
RDW: 12.8 % (ref 11.5–15.5)
WBC: 4.9 10*3/uL (ref 4.0–10.5)
nRBC: 0 % (ref 0.0–0.2)

## 2020-10-29 LAB — FERRITIN: Ferritin: 228 ng/mL (ref 24–336)

## 2020-10-30 ENCOUNTER — Ambulatory Visit
Admission: RE | Admit: 2020-10-30 | Discharge: 2020-10-30 | Disposition: A | Discharge status: Home / Self Care | Payer: Medicare Other | Source: Ambulatory Visit | Attending: Hematology and Oncology | Admitting: Hematology and Oncology

## 2020-10-30 DIAGNOSIS — C20 Malignant neoplasm of rectum: Secondary | ICD-10-CM | POA: Diagnosis not present

## 2020-10-30 LAB — CEA: CEA: 3.5 ng/mL (ref 0.0–4.7)

## 2020-10-30 MED ORDER — IOHEXOL 300 MG/ML  SOLN
100.0000 mL | Freq: Once | INTRAMUSCULAR | Status: AC | PRN
  Administered 2020-10-30: 100 mL via INTRAVENOUS

## 2020-11-02 ENCOUNTER — Ambulatory Visit: Payer: Medicare Other

## 2020-11-04 ENCOUNTER — Inpatient Hospital Stay: Payer: Medicare Other

## 2020-11-04 ENCOUNTER — Telehealth: Payer: Self-pay

## 2020-11-04 ENCOUNTER — Other Ambulatory Visit: Payer: Self-pay

## 2020-11-04 ENCOUNTER — Inpatient Hospital Stay (HOSPITAL_BASED_OUTPATIENT_CLINIC_OR_DEPARTMENT_OTHER): Payer: Medicare Other | Admitting: Oncology

## 2020-11-04 VITALS — BP 98/63 | HR 60 | Temp 97.7°F | Resp 18 | Wt 130.1 lb

## 2020-11-04 DIAGNOSIS — Z85048 Personal history of other malignant neoplasm of rectum, rectosigmoid junction, and anus: Secondary | ICD-10-CM | POA: Diagnosis not present

## 2020-11-04 DIAGNOSIS — R760 Raised antibody titer: Secondary | ICD-10-CM

## 2020-11-04 DIAGNOSIS — D509 Iron deficiency anemia, unspecified: Secondary | ICD-10-CM

## 2020-11-04 NOTE — Telephone Encounter (Signed)
error 

## 2020-11-04 NOTE — Progress Notes (Signed)
Hematology/Oncology Consult note [facility]  Telephone:([phone] [phone]  Patient Care Team: Dion Body, MD as PCP - General (Family Medicine) Noreene Filbert, MD as Referring Physician (Radiation Oncology) Hessie Knows, MD as Consulting Physician (Orthopedic Surgery) Kathline Magic, MD as Referring Physician (Student)   Name of the patient: Kenneth Luna  [id number]  28-Mar-1933   Date of visit: 11/04/20  Diagnosis- 1.  History of stage II rectal cancer in 2017 2.  History of iron deficiency anemia 3.  History of right lower extremity thrombosis and A. fib currently on Eliquis  Chief complaint/ Reason for visit-routine follow-up for about issues  Heme/Onc history: [name]. is a 87 y.o. male with stage IIC (T4bN0M0) low rectal adenocarcinoma s/p neoadjuvant chemotherapy and radiation followed by surgery.  He presented with rectal bleeding.  Colonoscopy on 07/10/2015 revealed a 4 cm frond-like/villous ulcerated non-obstructing mass in the rectum.  Biopsy revealed adenocarcinoma.Stage was early T3uN0.   He received 5 weeks of concurrent Xeloda and radiation (09/09/2015 - 10/22/2015).  He underwent robotic assisted APR with end colostomy at [facility] on 12/17/2015 at [facility].  Pathology revealed ypT4bN0M0 (stage IIC) invasive moderately differentiated adenocarcinoma.  Levator muscle was involved.   He received 6 cycles of adjuvant Xeloda (02/15/2016 -  06/06/2016).  Cycle #3 was truncated after 1 week secondary to palmar plantar erythrodysesthesia (hand foot syndrome).  Xeloda was dose reduced with cycle #4.   He underwent parastomal repair at [facility] on 07/11/2017.  Iron deficiency anemia: He has received IV iron in the past   He developed a RLE DVT followed right femoral venous access for a pacemaker placement on 01/08/2020.  Right lower extremity duplex was positive for extensive occlusive DVT in right peroneal, popliteal, and  femoral veins. He was started on Eliquis and underwent peripheral vascular thrombectomy on 01/20/2020.    Interval history-patient is here with his caregiver. He reports doing well presently.  Denies any specific complaints at this time  ECOG PS- 1 Pain scale- 0   Review of systems- Review of Systems  Constitutional:  Positive for malaise/fatigue. Negative for chills, fever and weight loss.  HENT:  Negative for congestion, ear discharge and nosebleeds.   Eyes:  Negative for blurred vision.       Chronic vision issues from macular degeneration  Respiratory:  Negative for cough, hemoptysis, sputum production, shortness of breath and wheezing.   Cardiovascular:  Negative for chest pain, palpitations, orthopnea and claudication.  Gastrointestinal:  Negative for abdominal pain, blood in stool, constipation, diarrhea, heartburn, melena, nausea and vomiting.  Genitourinary:  Negative for dysuria, flank pain, frequency, hematuria and urgency.  Musculoskeletal:  Negative for back pain, joint pain and myalgias.  Skin:  Negative for rash.  Neurological:  Negative for dizziness, tingling, focal weakness, seizures, weakness and headaches.  Endo/Heme/Allergies:  Does not bruise/bleed easily.  Psychiatric/Behavioral:  Negative for depression and suicidal ideas. The patient does not have insomnia.     No Known Allergies   Past Medical History:  Diagnosis Date   Allergic rhinitis    Anemia    B12 deficiency 06/29/2015   Benign neoplasm of ascending colon    Benign neoplasm of descending colon    Colon polyp    Colostomy in place [facility])    DD (diverticular disease) 06/29/2015   Dementia ([place])    memory issues   Fatty liver    GERD (gastroesophageal reflux disease)    Glaucoma    Hyperlipidemia  Hypertension    Rectal adenocarcinoma ([place]) 07/15/2015   Substance abuse ([place])    alcohol   Traumatic intraparenchymal hemorrhage ([place])      Past Surgical History:  Procedure Laterality Date    ABDOMINOPERINEAL PROCTOCOLECTOMY  12/17/2015   [facility]   CATARACT EXTRACTION W/ INTRAOCULAR LENS IMPLANT     COLONOSCOPY WITH PROPOFOL N/A 07/10/2015   Procedure: COLONOSCOPY WITH PROPOFOL;  Surgeon: Lucilla Lame, MD;  Location: [place];  Service: Endoscopy;  Laterality: N/A;  PER CAREGIVER WAS TOLD THAT PT WOULD BE 1ST (KEEP PT EARLY AM)   COLONOSCOPY WITH PROPOFOL N/A 04/24/2017   Procedure: COLONOSCOPY WITH PROPOFOL;  Surgeon: Lucilla Lame, MD;  Location: [place];  Service: Endoscopy;  Laterality: N/A;   INSERT / REPLACE / REMOVE PACEMAKER  01/08/2020   PACEMAKER INSERTION Left 01/07/2020   Procedure: INSERTION PACEMAKER;  Surgeon: Isaias Cowman, MD;  Location: [facility];  Service: Cardiovascular;  Laterality: Left;   PACEMAKER LEADLESS INSERTION N/A 01/07/2020   Procedure: PACEMAKER LEADLESS INSERTION;  Surgeon: Isaias Cowman, MD;  Location: [place] CV LAB;  Service: Cardiovascular;  Laterality: N/A;   PERIPHERAL VASCULAR BALLOON ANGIOPLASTY N/A 01/07/2020   Procedure: PERIPHERAL VASCULAR BALLOON ANGIOPLASTY;  Surgeon: Algernon Huxley, MD;  Location: [place] CV LAB;  Service: Cardiovascular;  Laterality: N/A;   PERIPHERAL VASCULAR BALLOON ANGIOPLASTY N/A 01/07/2020   Procedure: PERIPHERAL VASCULAR BALLOON ANGIOPLASTY;  Surgeon: Katha Cabal, MD;  Location: [place] CV LAB;  Service: Cardiovascular;  Laterality: N/A;   PERIPHERAL VASCULAR THROMBECTOMY Right 01/20/2020   Procedure: PERIPHERAL VASCULAR THROMBECTOMY;  Surgeon: Algernon Huxley, MD;  Location: [place] CV LAB;  Service: Cardiovascular;  Laterality: Right;   POLYPECTOMY  07/10/2015   Procedure: POLYPECTOMY;  Surgeon: Lucilla Lame, MD;  Location: [place];  Service: Endoscopy;;    Social History   Socioeconomic History   Marital status: Widowed    Spouse name: Not on file   Number of children: Not on file   Years of education: Not on file   Highest education level: Not  on file  Occupational History   Occupation: retired  Tobacco Use   Smoking status: Former    Types: Pipe    Quit date: 04/18/1978    Years since quitting: 42.5   Smokeless tobacco: Never  Vaping Use   Vaping Use: Never used  Substance and Sexual Activity   Alcohol use: Yes    Alcohol/week: 28.0 standard drinks    Types: 28 Glasses of wine per week    Comment: 4 glass of wine daily   Drug use: No   Sexual activity: Not on file  Other Topics Concern   Not on file  Social History Narrative   Not on file   Social Determinants of Health   Financial Resource Strain: Not on file  Food Insecurity: Not on file  Transportation Needs: Not on file  Physical Activity: Not on file  Stress: Not on file  Social Connections: Not on file  Intimate Partner Violence: Not on file    Family History  Problem Relation Age of Onset   Cancer Father        Prostate     Current Outpatient Medications:    acetaminophen (TYLENOL) 325 MG tablet, Take by mouth., Disp: , Rfl:    apixaban (ELIQUIS) 5 MG TABS tablet, Take 5 mg by mouth 2 (two) times daily., Disp: , Rfl:    B Complex-Biotin-FA (B-100 B-COMPLEX) TABS, Take 1 tablet by mouth  daily., Disp: , Rfl:    Bevacizumab (AVASTIN) 100 MG/4ML SOLN, Administer into right & left eye once monthly x 3 months, Disp: , Rfl:    brimonidine-timolol (COMBIGAN) 0.2-0.5 % ophthalmic solution, Place 1 drop into both eyes 2 (two) times daily., Disp: , Rfl:    cetirizine (ZYRTEC) 10 MG tablet, Take 10 mg by mouth daily. , Disp: , Rfl:    Cholecalciferol 25 MCG (1000 UT) tablet, Take by mouth., Disp: , Rfl:    docusate sodium (COLACE) 100 MG capsule, Take 100 mg by mouth daily., Disp: , Rfl:    donepezil (ARICEPT) 10 MG tablet, Take 10 mg by mouth at bedtime., Disp: , Rfl:    ferrous sulfate 325 (65 FE) MG tablet, Take 325 mg by mouth daily with breakfast., Disp: , Rfl:    fluticasone (FLONASE) 50 MCG/ACT nasal spray, Place 2 sprays into both nostrils daily as  needed for allergies or rhinitis. , Disp: , Rfl:    furosemide (LASIX) 20 MG tablet, Take 20 mg by mouth every other day., Disp: , Rfl:    gabapentin (NEURONTIN) 100 MG capsule, Take 100 mg by mouth daily., Disp: , Rfl:    losartan (COZAAR) 25 MG tablet, Take 1 tablet by mouth daily., Disp: , Rfl:    montelukast (SINGULAIR) 10 MG tablet, Take 10 mg by mouth at bedtime. , Disp: , Rfl:    Multiple Vitamins-Minerals (ICAPS AREDS 2 PO), Take 1 capsule by mouth in the morning and at bedtime. , Disp: , Rfl:    omeprazole (PRILOSEC) 20 MG capsule, Take 1 capsule (20 mg total) by mouth daily., Disp: 30 capsule, Rfl: 0  Physical exam:  Vitals:   11/04/20 1006 11/04/20 1011  BP:  98/63  Pulse:  60  Resp:  18  Temp:  97.7 F (36.5 C)  TempSrc:  Tympanic  SpO2:  100%  Weight: 130 lb 1.1 oz (59 kg)    Physical Exam Constitutional:      Comments: Ambulates with a cane. Appears in no acute distress  Cardiovascular:     Rate and Rhythm: Normal rate. Rhythm irregular.     Heart sounds: Normal heart sounds.  Pulmonary:     Effort: Pulmonary effort is normal.     Breath sounds: Normal breath sounds.  Abdominal:     General: Bowel sounds are normal.     Palpations: Abdomen is soft.  Skin:    General: Skin is warm and dry.  Neurological:     Mental Status: He is alert and oriented to person, place, and time.     CMP Latest Ref Rng & Units 10/29/2020  Glucose 70 - 99 mg/dL 91  BUN 8 - 23 mg/dL 15  Creatinine 0.61 - 1.24 mg/dL 0.67  Sodium 135 - 145 mmol/L 131(L)  Potassium 3.5 - 5.1 mmol/L 5.1  Chloride 98 - 111 mmol/L 97(L)  CO2 22 - 32 mmol/L 30  Calcium 8.9 - 10.3 mg/dL 10.2  Total Protein 6.5 - 8.1 g/dL 6.8  Total Bilirubin 0.3 - 1.2 mg/dL 0.9  Alkaline Phos 38 - 126 U/L 75  AST 15 - 41 U/L 34  ALT 0 - 44 U/L 25   CBC Latest Ref Rng & Units 10/29/2020  WBC 4.0 - 10.5 K/uL 4.9  Hemoglobin 13.0 - 17.0 g/dL 14.1  Hematocrit 39.0 - 52.0 % 40.8  Platelets 150 - 400 K/uL 178    No  images are attached to the encounter.  CT CHEST ABDOMEN PELVIS W CONTRAST  Result Date: 10/30/2020 CLINICAL DATA:  Rectal carcinoma.  Asymptomatic.  Surveillance. EXAM: CT CHEST, ABDOMEN, AND PELVIS WITH CONTRAST TECHNIQUE: Multidetector CT imaging of the chest, abdomen and pelvis was performed following the standard protocol during bolus administration of intravenous contrast. CONTRAST:  186mL OMNIPAQUE IOHEXOL 300 MG/ML  SOLN COMPARISON:  10/31/2019 FINDINGS: CT CHEST FINDINGS Cardiovascular: Pacer with tip at right ventricle. Aortic atherosclerosis. Tortuous thoracic aorta. Mild cardiomegaly. Three vessel coronary artery calcification. No central pulmonary embolism, on this non-dedicated study. Mediastinum/Nodes: 8 mm left thyroid nodule Not clinically significant; no follow-up imaging recommended No mediastinal or hilar adenopathy. (ref: J Am Coll Radiol. 2015 Feb;12(2): 143-50). Lungs/Pleura: No pleural fluid.  mild centrilobular emphysema. Mild reticulonodular opacity in the left upper lobe including on 61/4 is similar and likely post infectious or inflammatory scarring. Musculoskeletal: Similar mild T12 compression deformity. Remote bilateral rib fractures CT ABDOMEN PELVIS FINDINGS Hepatobiliary: Calcification in the liver is likely related to old granulomatous disease. No suspicious liver lesion. Tiny gallstone. No acute cholecystitis or biliary duct dilatation. Pancreas: Normal, without mass or ductal dilatation. Spleen: Normal in size, without focal abnormality. Adrenals/Urinary Tract: Normal adrenal glands. Normal kidneys, without hydronephrosis. The bladder appears mildly thick walled, but is underdistended. Stomach/Bowel: Proximal gastric underdistention. Status post abdominal perineal resection with small bowel containing parastomal hernia. No obstruction or other acute complication. Normal terminal ileum. Otherwise normal small bowel. Vascular/Lymphatic: Aortic atherosclerosis. No abdominopelvic  adenopathy. Reproductive: Normal prostate. Other: No significant free fluid. Presacral soft tissue thickening is unchanged. No evidence of omental or peritoneal disease. Musculoskeletal: Osteopenia. Lumbosacral spondylosis with grade 1 L4-5 anterolisthesis. Moderate L1 compression deformity with mild ventral canal encroachment is similar. S-shaped thoracolumbar spine curvature. IMPRESSION: 1. Status post abdominal perineal resection, without evidence of recurrent or metastatic disease. 2. 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 3. Aortic atherosclerosis (ICD10-I70.0), coronary artery atherosclerosis and emphysema (ICD10-J43.9). 4. Cholelithiasis. 5. Possible bladder wall thickening versus artifactual in the setting of underdistention. Correlate with any symptoms of bladder outlet obstruction or cystitis. Electronically Signed   By: Abigail Miyamoto M.D.   On: 10/30/2020 14:37     Assessment and plan- Patient is a 87 y.o. male who is here for follow-up of following medical issues:  History of stage II rectal cancer: This was back in 2017 and he is s/p chemoradiation and surgery.  Most recent CT chest abdomen and pelvis with contrast on 10/30/2020 did not show any evidence of Recurrence.  He is now at 5-year mark post diagnosis and therefore does not require any surveillance imaging moving forward. History of iron deficiency anemia: Patient is presently not anemic and his ferritin is presently normal at 228.  He does not require any IV iron at this time History of right femoral/popliteal and peroneal vein thrombosis: This was in October 2021 following right femoral access for pacemaker placement.  He underwent thrombectomy and has been on Eliquis since then.  Dr. Mike Gip had ordered his hypercoagulable work-up including antiphospholipid antibody panel which showsAn elevated anticardiolipin antibody IgM titer of 59.  This will need to be checked again at this time to confirm of the levels  remain more than 40 which would technically qualify him for diagnosis of antiphospholipid antibody syndrome. There is evidence to say that an antiphospholipid antibody syndrome Coumadin is better than Eliquis to prevent future episodes of thromboembolism.  Depending on the results of his blood work today I will reach out to Dr. Nehemiah Massed who is his cardiologist to decide about anticoagulation for him.  He is presently on Eliquis   I will see him back in 6 months with labs   Visit Diagnosis 1. Anticardiolipin antibody positive   2. Iron deficiency anemia, unspecified iron deficiency anemia type   3. History of rectal cancer      Dr. Randa Evens, MD, MPH [facility] at [facility] [phone] 11/04/2020 11:01 AM

## 2020-11-09 LAB — BETA-2-GLYCOPROTEIN I ABS, IGG/M/A
Beta-2 Glyco I IgG: <9 GPI IgG units (ref 0–20)
Beta-2-Glycoprotein I IgA: <9 GPI IgA units (ref 0–25)
Beta-2-Glycoprotein I IgM: <9 GPI IgM units (ref 0–32)

## 2020-11-10 LAB — CARDIOLIPIN ANTIBODIES, IGM+IGG
Anticardiolipin IgG: <9 GPL U/mL (ref 0–14)
Anticardiolipin IgM: 27 MPL U/mL — ABNORMAL HIGH (ref 0–12)

## 2021-02-09 ENCOUNTER — Other Ambulatory Visit: Payer: Self-pay

## 2021-02-09 ENCOUNTER — Ambulatory Visit (INDEPENDENT_AMBULATORY_CARE_PROVIDER_SITE_OTHER): Payer: Medicare Other | Admitting: Vascular Surgery

## 2021-02-09 ENCOUNTER — Encounter (INDEPENDENT_AMBULATORY_CARE_PROVIDER_SITE_OTHER): Payer: Self-pay | Admitting: Vascular Surgery

## 2021-02-09 VITALS — BP 125/71 | HR 59 | Ht 65.0 in | Wt 127.0 lb

## 2021-02-09 DIAGNOSIS — I495 Sick sinus syndrome: Secondary | ICD-10-CM

## 2021-02-09 DIAGNOSIS — I10 Essential (primary) hypertension: Secondary | ICD-10-CM

## 2021-02-09 DIAGNOSIS — I82411 Acute embolism and thrombosis of right femoral vein: Secondary | ICD-10-CM

## 2021-02-09 NOTE — Assessment & Plan Note (Signed)
Status post thrombectomy for extensive right lower extremity DVT with marked swelling about a year ago.  He is doing great.  He is tolerating anticoagulation.  He has no residual swelling and he does wear his compression stockings daily.  At this point, I think we can see him back as needed.

## 2021-02-09 NOTE — Progress Notes (Signed)
MRN : [id number]  Kenneth Luna. is a 87 y.o. ([birth year]) male who presents with chief complaint of  Chief Complaint  Patient presents with   Follow-up    76yr no studies  .  History of Present Illness: Patient returns today in follow up of his right lower extremity DVT.  He is about a year status post thrombectomy for extensive right lower extremity DVT with marked leg swelling.  He has done great.  He is tolerating anticoagulation.  He has no pain or swelling in his legs at all at this point.  He does wear his compression stockings daily and is wearing them today.  Current Outpatient Medications  Medication Sig Dispense Refill   acetaminophen (TYLENOL) 325 MG tablet Take by mouth.     apixaban (ELIQUIS) 5 MG TABS tablet Take 5 mg by mouth 2 (two) times daily.     B Complex-Biotin-FA (B-100 B-COMPLEX) TABS Take 1 tablet by mouth daily.     Bevacizumab (AVASTIN) 100 MG/4ML SOLN Administer into right & left eye once monthly x 3 months     brimonidine-timolol (COMBIGAN) 0.2-0.5 % ophthalmic solution Place 1 drop into both eyes 2 (two) times daily.     cetirizine (ZYRTEC) 10 MG tablet Take 10 mg by mouth daily.      Cholecalciferol 25 MCG (1000 UT) tablet Take by mouth.     docusate sodium (COLACE) 100 MG capsule Take 100 mg by mouth daily.     donepezil (ARICEPT) 10 MG tablet Take 10 mg by mouth at bedtime.     ferrous sulfate 325 (65 FE) MG tablet Take 325 mg by mouth daily with breakfast.     fluticasone (FLONASE) 50 MCG/ACT nasal spray Place 2 sprays into both nostrils daily as needed for allergies or rhinitis.      furosemide (LASIX) 20 MG tablet Take 20 mg by mouth every other day.     gabapentin (NEURONTIN) 100 MG capsule Take 100 mg by mouth daily.     losartan (COZAAR) 25 MG tablet Take 1 tablet by mouth daily.     montelukast (SINGULAIR) 10 MG tablet Take 10 mg by mouth at bedtime.      Multiple Vitamins-Minerals (ICAPS AREDS 2 PO) Take 1 capsule by mouth in the  morning and at bedtime.      omeprazole (PRILOSEC) 20 MG capsule Take 1 capsule (20 mg total) by mouth daily. 30 capsule 0   No current facility-administered medications for this visit.    Past Medical History:  Diagnosis Date   Allergic rhinitis    Anemia    B12 deficiency 06/29/2015   Benign neoplasm of ascending colon    Benign neoplasm of descending colon    Colon polyp    Colostomy in place [facility])    DD (diverticular disease) 06/29/2015   Dementia ([place])    memory issues   Fatty liver    GERD (gastroesophageal reflux disease)    Glaucoma    Hyperlipidemia    Hypertension    Rectal adenocarcinoma ([place]) 07/15/2015   Substance abuse ([place])    alcohol   Traumatic intraparenchymal hemorrhage     Past Surgical History:  Procedure Laterality Date   ABDOMINOPERINEAL PROCTOCOLECTOMY  12/17/2015   [facility]   CATARACT EXTRACTION W/ INTRAOCULAR LENS IMPLANT     COLONOSCOPY WITH PROPOFOL N/A 07/10/2015   Procedure: COLONOSCOPY WITH PROPOFOL;  Surgeon: Lucilla Lame, MD;  Location: [place];  Service: Endoscopy;  Laterality: N/A;  PER CAREGIVER  WAS TOLD THAT PT WOULD BE 1ST (KEEP PT EARLY AM)   COLONOSCOPY WITH PROPOFOL N/A 04/24/2017   Procedure: COLONOSCOPY WITH PROPOFOL;  Surgeon: Lucilla Lame, MD;  Location: [place];  Service: Endoscopy;  Laterality: N/A;   INSERT / REPLACE / REMOVE PACEMAKER  01/08/2020   PACEMAKER INSERTION Left 01/07/2020   Procedure: INSERTION PACEMAKER;  Surgeon: Isaias Cowman, MD;  Location: [facility];  Service: Cardiovascular;  Laterality: Left;   PACEMAKER LEADLESS INSERTION N/A 01/07/2020   Procedure: PACEMAKER LEADLESS INSERTION;  Surgeon: Isaias Cowman, MD;  Location: [place] CV LAB;  Service: Cardiovascular;  Laterality: N/A;   PERIPHERAL VASCULAR BALLOON ANGIOPLASTY N/A 01/07/2020   Procedure: PERIPHERAL VASCULAR BALLOON ANGIOPLASTY;  Surgeon: Algernon Huxley, MD;  Location: [place] CV LAB;  Service: Cardiovascular;   Laterality: N/A;   PERIPHERAL VASCULAR BALLOON ANGIOPLASTY N/A 01/07/2020   Procedure: PERIPHERAL VASCULAR BALLOON ANGIOPLASTY;  Surgeon: Katha Cabal, MD;  Location: [place] CV LAB;  Service: Cardiovascular;  Laterality: N/A;   PERIPHERAL VASCULAR THROMBECTOMY Right 01/20/2020   Procedure: PERIPHERAL VASCULAR THROMBECTOMY;  Surgeon: Algernon Huxley, MD;  Location: [place] CV LAB;  Service: Cardiovascular;  Laterality: Right;   POLYPECTOMY  07/10/2015   Procedure: POLYPECTOMY;  Surgeon: Lucilla Lame, MD;  Location: [place];  Service: Endoscopy;;     Social History   Tobacco Use   Smoking status: Former    Types: Pipe    Quit date: 04/18/1978    Years since quitting: 42.8   Smokeless tobacco: Never  Vaping Use   Vaping Use: Never used  Substance Use Topics   Alcohol use: Yes    Alcohol/week: 28.0 standard drinks    Types: 28 Glasses of wine per week    Comment: 4 glass of wine daily   Drug use: No      Family History  Problem Relation Age of Onset   Cancer Father        Prostate     No Known Allergies  REVIEW OF SYSTEMS (Negative unless checked)   Constitutional: [] Weight loss  [] Fever  [] Chills Cardiac: [] Chest pain   [] Chest pressure   [x] Palpitations   [] Shortness of breath when laying flat   [] Shortness of breath at rest   [x] Shortness of breath with exertion. Vascular:  [] Pain in legs with walking   [] Pain in legs at rest   [] Pain in legs when laying flat   [] Claudication   [] Pain in feet when walking  [] Pain in feet at rest  [] Pain in feet when laying flat   [x] History of DVT   [x] Phlebitis   [x] Swelling in legs   [] Varicose veins   [] Non-healing ulcers Pulmonary:   [] Uses home oxygen   [] Productive cough   [] Hemoptysis   [] Wheeze  [] COPD   [] Asthma Neurologic:  [] Dizziness  [] Blackouts   [] Seizures   [] History of stroke   [] History of TIA  [] Aphasia   [] Temporary blindness   [] Dysphagia   [] Weakness or numbness in arms   [] Weakness or numbness  in legs X positive for memory issues and cognitive issues Musculoskeletal:  [x] Arthritis   [] Joint swelling   [x] Joint pain   [] Low back pain Hematologic:  [] Easy bruising  [] Easy bleeding   [] Hypercoagulable state   [] Anemic   Gastrointestinal:  [] Blood in stool   [] Vomiting blood  [] Gastroesophageal reflux/heartburn   [] Abdominal pain Genitourinary:  [] Chronic kidney disease   [] Difficult urination  [] Frequent urination  [] Burning with urination   [] Hematuria Skin:  [] Rashes   []   Ulcers   [] Wounds Psychological:  [] History of anxiety   []  History of major depression.  Physical Examination  BP 125/71   Pulse (!) 59   Ht 5\' 5"  (1.651 m)   Wt 127 lb (57.6 kg)   BMI 21.13 kg/m  Gen:  WD/WN, NAD Head: [place]/AT, No temporalis wasting. Ear/Nose/Throat: Hearing grossly intact, nares w/o erythema or drainage Eyes: Conjunctiva clear. Sclera non-icteric Neck: Supple.  Trachea midline Pulmonary:  Good air movement, no use of accessory muscles.  Cardiac: somewhat irregular Vascular:  Vessel Right Left  Radial Palpable Palpable           Musculoskeletal: M/S 5/5 throughout.  No deformity or atrophy.  No lower extremity edema. Neurologic: Sensation grossly intact in extremities.  Symmetrical.  Speech is fluent.  Psychiatric: Judgment and insight are fair at best. Dermatologic: No rashes or ulcers noted.  No cellulitis or open wounds.      Labs No results found for this or any previous visit (from the past 2160 hour(s)).  Radiology No results found.  Assessment/Plan Sick sinus syndrome [facility]) Status post pacemaker placement.  Attempts at percutaneous pacemaker from a femoral approach were not successful due to his venous stenoses.   Essential (primary) hypertension blood pressure control important in reducing the progression of atherosclerotic disease. On appropriate oral medications.  DVT (deep venous thrombosis) (HCC) Status post thrombectomy for extensive right lower extremity DVT  with marked swelling about a year ago.  He is doing great.  He is tolerating anticoagulation.  He has no residual swelling and he does wear his compression stockings daily.  At this point, I think we can see him back as needed.    [name], MD  02/09/2021 4:09 PM    This note was created with Dragon medical transcription system.  Any errors from dictation are purely unintentional

## 2021-05-12 ENCOUNTER — Inpatient Hospital Stay: Payer: Medicare Other

## 2021-05-12 ENCOUNTER — Ambulatory Visit: Payer: Medicare Other | Admitting: Oncology

## 2021-05-12 ENCOUNTER — Other Ambulatory Visit: Payer: Self-pay | Admitting: *Deleted

## 2021-05-12 DIAGNOSIS — D509 Iron deficiency anemia, unspecified: Secondary | ICD-10-CM

## 2021-05-19 ENCOUNTER — Other Ambulatory Visit: Payer: Self-pay

## 2021-05-19 ENCOUNTER — Inpatient Hospital Stay: Payer: Medicare Other | Attending: Oncology

## 2021-05-19 ENCOUNTER — Inpatient Hospital Stay (HOSPITAL_BASED_OUTPATIENT_CLINIC_OR_DEPARTMENT_OTHER): Payer: Medicare Other | Admitting: Oncology

## 2021-05-19 ENCOUNTER — Encounter: Payer: Self-pay | Admitting: Oncology

## 2021-05-19 DIAGNOSIS — Z8042 Family history of malignant neoplasm of prostate: Secondary | ICD-10-CM | POA: Diagnosis not present

## 2021-05-19 DIAGNOSIS — Z86010 Personal history of colonic polyps: Secondary | ICD-10-CM | POA: Insufficient documentation

## 2021-05-19 DIAGNOSIS — D509 Iron deficiency anemia, unspecified: Secondary | ICD-10-CM

## 2021-05-19 DIAGNOSIS — Z950 Presence of cardiac pacemaker: Secondary | ICD-10-CM | POA: Diagnosis not present

## 2021-05-19 DIAGNOSIS — Z87891 Personal history of nicotine dependence: Secondary | ICD-10-CM | POA: Diagnosis not present

## 2021-05-19 DIAGNOSIS — Z86718 Personal history of other venous thrombosis and embolism: Secondary | ICD-10-CM | POA: Insufficient documentation

## 2021-05-19 DIAGNOSIS — R760 Raised antibody titer: Secondary | ICD-10-CM

## 2021-05-19 DIAGNOSIS — Z7901 Long term (current) use of anticoagulants: Secondary | ICD-10-CM | POA: Diagnosis not present

## 2021-05-19 DIAGNOSIS — Z08 Encounter for follow-up examination after completed treatment for malignant neoplasm: Secondary | ICD-10-CM

## 2021-05-19 DIAGNOSIS — Z85048 Personal history of other malignant neoplasm of rectum, rectosigmoid junction, and anus: Secondary | ICD-10-CM | POA: Diagnosis not present

## 2021-05-19 DIAGNOSIS — I4891 Unspecified atrial fibrillation: Secondary | ICD-10-CM | POA: Insufficient documentation

## 2021-05-19 LAB — COMPREHENSIVE METABOLIC PANEL WITH GFR
ALT: 27 U/L (ref 0–44)
AST: 35 U/L (ref 15–41)
Albumin: 4.2 g/dL (ref 3.5–5.0)
Alkaline Phosphatase: 88 U/L (ref 38–126)
Anion gap: 8 (ref 5–15)
BUN: 17 mg/dL (ref 8–23)
CO2: 26 mmol/L (ref 22–32)
Calcium: 9.9 mg/dL (ref 8.9–10.3)
Chloride: 96 mmol/L — ABNORMAL LOW (ref 98–111)
Creatinine, Ser: 0.65 mg/dL (ref 0.61–1.24)
GFR, Estimated: >60 mL/min
Glucose, Bld: 99 mg/dL (ref 70–99)
Potassium: 4.2 mmol/L (ref 3.5–5.1)
Sodium: 130 mmol/L — ABNORMAL LOW (ref 135–145)
Total Bilirubin: 1 mg/dL (ref 0.3–1.2)
Total Protein: 6.9 g/dL (ref 6.5–8.1)

## 2021-05-19 LAB — IRON AND TIBC
Iron: 116 ug/dL (ref 45–182)
Saturation Ratios: 32 % (ref 17.9–39.5)
TIBC: 361 ug/dL (ref 250–450)
UIBC: 245 ug/dL

## 2021-05-19 LAB — CBC WITH DIFFERENTIAL/PLATELET
Abs Immature Granulocytes: 0.02 10*3/uL (ref 0.00–0.07)
Basophils Absolute: 0 10*3/uL (ref 0.0–0.1)
Basophils Relative: 1 %
Eosinophils Absolute: 0.2 10*3/uL (ref 0.0–0.5)
Eosinophils Relative: 4 %
HCT: 41.7 % (ref 39.0–52.0)
Hemoglobin: 14 g/dL (ref 13.0–17.0)
Immature Granulocytes: 0 %
Lymphocytes Relative: 20 %
Lymphs Abs: 1 10*3/uL (ref 0.7–4.0)
MCH: 32.6 pg (ref 26.0–34.0)
MCHC: 33.6 g/dL (ref 30.0–36.0)
MCV: 97 fL (ref 80.0–100.0)
Monocytes Absolute: 0.5 10*3/uL (ref 0.1–1.0)
Monocytes Relative: 10 %
Neutro Abs: 3.4 10*3/uL (ref 1.7–7.7)
Neutrophils Relative %: 65 %
Platelets: 187 10*3/uL (ref 150–400)
RBC: 4.3 MIL/uL (ref 4.22–5.81)
RDW: 12.9 % (ref 11.5–15.5)
WBC: 5.2 10*3/uL (ref 4.0–10.5)
nRBC: 0 % (ref 0.0–0.2)

## 2021-05-19 LAB — VITAMIN B12: Vitamin B-12: 553 pg/mL (ref 180–914)

## 2021-05-19 LAB — FOLATE: Folate: 56 ng/mL (ref >5.9)

## 2021-05-19 LAB — FERRITIN: Ferritin: 140 ng/mL (ref 24–336)

## 2021-05-19 NOTE — Progress Notes (Signed)
Hematology/Oncology Consult note [facility]  [phone] [phone]  Patient Care Team: Dion Body, MD as PCP - General (Family Medicine) Noreene Filbert, MD as Referring Physician (Radiation Oncology) Hessie Knows, MD as Consulting Physician (Orthopedic Surgery) Kathline Magic, MD as Referring Physician (Student) Sindy Guadeloupe, MD as Consulting Physician (Oncology)   Name of the patient: Kenneth Luna  [id number]  [birth year]   Date of visit: 05/19/21  Diagnosis- 1.  History of stage II rectal cancer in 2017 2.  History of iron deficiency anemia 3.  History of right lower extremity thrombosis and A. fib currently on Eliquis    Chief complaint/ Reason for visit-routine follow-up of anemia and rectal cancer  Heme/Onc history: Kenneth Luna. is a 88 y.o. male with stage IIC (T4bN0M0) low rectal adenocarcinoma s/p neoadjuvant chemotherapy and radiation followed by surgery.  He presented with rectal bleeding.  Colonoscopy on 07/10/2015 revealed a 4 cm frond-like/villous ulcerated non-obstructing mass in the rectum.  Biopsy revealed adenocarcinoma.Stage was early T3uN0.   He received 5 weeks of concurrent Xeloda and radiation (09/09/2015 - 10/22/2015).  He underwent robotic assisted APR with end colostomy at [facility] on 12/17/2015 at [facility].  Pathology revealed ypT4bN0M0 (stage IIC) invasive moderately differentiated adenocarcinoma.  Levator muscle was involved.   He received 6 cycles of adjuvant Xeloda (02/15/2016 -  06/06/2016).  Cycle #3 was truncated after 1 week secondary to palmar plantar erythrodysesthesia (hand foot syndrome).  Xeloda was dose reduced with cycle #4.   He underwent parastomal repair at [facility] on 07/11/2017.   Iron deficiency anemia: He has received IV iron in the past     He developed a RLE DVT followed right femoral venous access for a pacemaker placement on 01/08/2020.  Right lower extremity duplex  was positive for extensive occlusive DVT in right peroneal, popliteal, and femoral veins. He was started on Eliquis and underwent peripheral vascular thrombectomy on 01/20/2020.   Interval history-patient is doing well and denies any specific complaints at this time.  Appetite and weight have remained stable.Denies any blood loss in stool.  Tolerating Eliquis well and has not had any falls.  ECOG PS- 2 Pain scale- 0   Review of systems- Review of Systems  Constitutional:  Positive for malaise/fatigue. Negative for chills, fever and weight loss.  HENT:  Negative for congestion, ear discharge and nosebleeds.   Eyes:  Negative for blurred vision.  Respiratory:  Negative for cough, hemoptysis, sputum production, shortness of breath and wheezing.   Cardiovascular:  Negative for chest pain, palpitations, orthopnea and claudication.  Gastrointestinal:  Negative for abdominal pain, blood in stool, constipation, diarrhea, heartburn, melena, nausea and vomiting.  Genitourinary:  Negative for dysuria, flank pain, frequency, hematuria and urgency.  Musculoskeletal:  Negative for back pain, joint pain and myalgias.  Skin:  Negative for rash.  Neurological:  Negative for dizziness, tingling, focal weakness, seizures, weakness and headaches.  Endo/Heme/Allergies:  Does not bruise/bleed easily.  Psychiatric/Behavioral:  Negative for depression and suicidal ideas. The patient does not have insomnia.       No Known Allergies   Past Medical History:  Diagnosis Date   Allergic rhinitis    Anemia    B12 deficiency 06/29/2015   Benign neoplasm of ascending colon    Benign neoplasm of descending colon    Colon polyp    Colostomy in place [facility])    DD (diverticular disease) 06/29/2015   Dementia ([place])    memory issues  Fatty liver    GERD (gastroesophageal reflux disease)    Glaucoma    Hyperlipidemia    Hypertension    Rectal adenocarcinoma ([place]) 07/15/2015   Substance abuse ([place])    alcohol    Traumatic intraparenchymal hemorrhage      Past Surgical History:  Procedure Laterality Date   ABDOMINOPERINEAL PROCTOCOLECTOMY  12/17/2015   [facility]   CATARACT EXTRACTION W/ INTRAOCULAR LENS IMPLANT     COLONOSCOPY WITH PROPOFOL N/A 07/10/2015   Procedure: COLONOSCOPY WITH PROPOFOL;  Surgeon: Lucilla Lame, MD;  Location: [place];  Service: Endoscopy;  Laterality: N/A;  PER CAREGIVER WAS TOLD THAT PT WOULD BE 1ST (KEEP PT EARLY AM)   COLONOSCOPY WITH PROPOFOL N/A 04/24/2017   Procedure: COLONOSCOPY WITH PROPOFOL;  Surgeon: Lucilla Lame, MD;  Location: [place];  Service: Endoscopy;  Laterality: N/A;   INSERT / REPLACE / REMOVE PACEMAKER  01/08/2020   PACEMAKER INSERTION Left 01/07/2020   Procedure: INSERTION PACEMAKER;  Surgeon: Isaias Cowman, MD;  Location: [facility];  Service: Cardiovascular;  Laterality: Left;   PACEMAKER LEADLESS INSERTION N/A 01/07/2020   Procedure: PACEMAKER LEADLESS INSERTION;  Surgeon: Isaias Cowman, MD;  Location: [place] CV [facility];  Service: Cardiovascular;  Laterality: N/A;   PERIPHERAL VASCULAR BALLOON ANGIOPLASTY N/A 01/07/2020   Procedure: PERIPHERAL VASCULAR BALLOON ANGIOPLASTY;  Surgeon: Algernon Huxley, MD;  Location: [place] [facility];  Service: Cardiovascular;  Laterality: N/A;   PERIPHERAL VASCULAR BALLOON ANGIOPLASTY N/A 01/07/2020   Procedure: PERIPHERAL VASCULAR BALLOON ANGIOPLASTY;  Surgeon: Katha Cabal, MD;  Location: [place] CV LAB;  Service: Cardiovascular;  Laterality: N/A;   PERIPHERAL VASCULAR THROMBECTOMY Right 01/20/2020   Procedure: PERIPHERAL VASCULAR THROMBECTOMY;  Surgeon: Algernon Huxley, MD;  Location: [place] CV LAB;  Service: Cardiovascular;  Laterality: Right;   POLYPECTOMY  07/10/2015   Procedure: POLYPECTOMY;  Surgeon: Lucilla Lame, MD;  Location: [place];  Service: Endoscopy;;    Social History   Socioeconomic History   Marital status: Widowed    Spouse name: Not on file    Number of children: Not on file   Years of education: Not on file   Highest education level: Not on file  Occupational History   Occupation: retired  Tobacco Use   Smoking status: Former    Types: Pipe    Quit date: 04/18/1978    Years since quitting: 43.1   Smokeless tobacco: Never  Vaping Use   Vaping Use: Never used  Substance and Sexual Activity   Alcohol use: Yes    Alcohol/week: 28.0 standard drinks    Types: 28 Glasses of wine per week    Comment: 4  small glass of wine daily   Drug use: No   Sexual activity: Not Currently  Other Topics Concern   Not on file  Social History Narrative   Not on file   Social Determinants of Health   Financial Resource Strain: Not on file  Food Insecurity: Not on file  Transportation Needs: Not on file  Physical Activity: Not on file  Stress: Not on file  Social Connections: Not on file  Intimate Partner Violence: Not on file    Family History  Problem Relation Age of Onset   Cancer Father        Prostate     Current Outpatient Medications:    apixaban (ELIQUIS) 5 MG TABS tablet, Take 5 mg by mouth 2 (two) times daily., Disp: , Rfl:    B Complex-Biotin-FA (B-100 B-COMPLEX)  TABS, Take 1 tablet by mouth daily., Disp: , Rfl:    Bevacizumab (AVASTIN) 100 MG/4ML SOLN, Administer into right & left eye once monthly x 3 months, Disp: , Rfl:    brimonidine-timolol (COMBIGAN) 0.2-0.5 % ophthalmic solution, Place 1 drop into both eyes 2 (two) times daily., Disp: , Rfl:    cetirizine (ZYRTEC) 10 MG tablet, Take 10 mg by mouth daily. , Disp: , Rfl:    Cholecalciferol 25 MCG (1000 UT) tablet, Take 1,000 Units by mouth daily., Disp: , Rfl:    docusate sodium (COLACE) 100 MG capsule, Take 100 mg by mouth daily., Disp: , Rfl:    donepezil (ARICEPT) 10 MG tablet, Take 10 mg by mouth at bedtime., Disp: , Rfl:    ferrous sulfate 325 (65 FE) MG tablet, Take 325 mg by mouth daily with breakfast., Disp: , Rfl:    fluticasone (FLONASE) 50 MCG/ACT nasal  spray, Place 2 sprays into both nostrils daily as needed for allergies or rhinitis. , Disp: , Rfl:    furosemide (LASIX) 20 MG tablet, Take 20 mg by mouth every other day., Disp: , Rfl:    gabapentin (NEURONTIN) 100 MG capsule, Take 100 mg by mouth daily., Disp: , Rfl:    losartan (COZAAR) 25 MG tablet, Take 1 tablet by mouth daily., Disp: , Rfl:    montelukast (SINGULAIR) 10 MG tablet, Take 10 mg by mouth at bedtime. , Disp: , Rfl:    Multiple Vitamins-Minerals (CENTRUM SILVER 50+MEN PO), Take 1 capsule by mouth daily., Disp: , Rfl:    Multiple Vitamins-Minerals (ICAPS AREDS 2 PO), Take 1 capsule by mouth daily., Disp: , Rfl:    omeprazole (PRILOSEC) 20 MG capsule, Take 1 capsule (20 mg total) by mouth daily., Disp: 30 capsule, Rfl: 0  Physical exam:  Physical Exam Constitutional:      Comments: Frail elderly gentleman who ambulates with a cane.  Appears in no acute distress  Cardiovascular:     Rate and Rhythm: Normal rate and regular rhythm.     Heart sounds: Normal heart sounds.  Pulmonary:     Effort: Pulmonary effort is normal.     Breath sounds: Normal breath sounds.  Abdominal:     General: Bowel sounds are normal.     Palpations: Abdomen is soft.  Skin:    General: Skin is warm and dry.  Neurological:     Mental Status: He is alert and oriented to person, place, and time.     CMP Latest Ref Rng & Units 05/19/2021  Glucose 70 - 99 mg/dL 99  BUN 8 - 23 mg/dL 17  Creatinine 0.61 - 1.24 mg/dL 0.65  Sodium 135 - 145 mmol/L 130(L)  Potassium 3.5 - 5.1 mmol/L 4.2  Chloride 98 - 111 mmol/L 96(L)  CO2 22 - 32 mmol/L 26  Calcium 8.9 - 10.3 mg/dL 9.9  Total Protein 6.5 - 8.1 g/dL 6.9  Total Bilirubin 0.3 - 1.2 mg/dL 1.0  Alkaline Phos 38 - 126 U/L 88  AST 15 - 41 U/L 35  ALT 0 - 44 U/L 27   CBC Latest Ref Rng & Units 05/19/2021  WBC 4.0 - 10.5 K/uL 5.2  Hemoglobin 13.0 - 17.0 g/dL 14.0  Hematocrit 39.0 - 52.0 % 41.7  Platelets 150 - 400 K/uL 187    No images are attached  to the encounter.  No results found.   Assessment and plan- Patient is a 88 y.o. male who is followed for following issues:  Stage II rectal  cancer in 2017: Scan in July 2022 did not show any evidence of recurrence.  He is ambulating 5 years since his rectal cancer diagnosis and does not require any surveillance imaging at this time.  Patient is also chosen to forego future colonoscopies given his age.  He does not require any oncology follow-up at this time for colon cancer/rectal cancer. Iron deficiency anemia: Patient's hemoglobin is remained stable around 14.  Stools.  Iron studies still pending.  Not suspect anemia requiring IV iron at this time. Long-term anticoagulation use.  He has history of right lower extremity DVT which was found in September 2021 after he had a right femoral venous access for pacemaker placement.  This appears to be a provoked event has been more than a year since he has been on anticoagulation.  From a hematology standpoint he does not need to stay on Eliquis.  However if he needs to continue Eliquis from an A. fib standpoint it would be reasonable for him to continue that.  Patient does not require follow-up with oncology at this time and can be referred to [place] in the future if questions or concerns arise   Visit Diagnosis 1. Iron deficiency anemia, unspecified iron deficiency anemia type   2. Current use of long term anticoagulation   3. Encounter for follow-up surveillance of rectal cancer      Dr. Randa Evens, MD, MPH [facility] [phone] 05/19/2021 12:52 PM

## 2021-05-20 LAB — CEA: CEA: 3.6 ng/mL (ref 0.0–4.7)

## 2021-10-13 ENCOUNTER — Ambulatory Visit
Admission: RE | Admit: 2021-10-13 | Discharge: 2021-10-13 | Disposition: A | Discharge status: Home / Self Care | Payer: Medicare Other | Source: Ambulatory Visit | Attending: Physician Assistant | Admitting: Physician Assistant

## 2021-10-13 ENCOUNTER — Other Ambulatory Visit: Payer: Self-pay | Admitting: Physician Assistant

## 2021-10-13 DIAGNOSIS — R2689 Other abnormalities of gait and mobility: Secondary | ICD-10-CM | POA: Diagnosis not present

## 2021-10-13 DIAGNOSIS — W19XXXA Unspecified fall, initial encounter: Secondary | ICD-10-CM | POA: Insufficient documentation

## 2021-10-13 DIAGNOSIS — F4489 Other dissociative and conversion disorders: Secondary | ICD-10-CM | POA: Insufficient documentation

## 2021-10-13 DIAGNOSIS — Y92009 Unspecified place in unspecified non-institutional (private) residence as the place of occurrence of the external cause: Secondary | ICD-10-CM | POA: Diagnosis not present

## 2022-02-14 ENCOUNTER — Encounter (INDEPENDENT_AMBULATORY_CARE_PROVIDER_SITE_OTHER): Payer: Self-pay

## 2022-09-01 ENCOUNTER — Other Ambulatory Visit: Payer: Self-pay | Admitting: Physician Assistant

## 2022-09-01 ENCOUNTER — Encounter: Payer: Self-pay | Admitting: Hematology and Oncology

## 2022-09-01 DIAGNOSIS — W19XXXA Unspecified fall, initial encounter: Secondary | ICD-10-CM

## 2022-09-01 DIAGNOSIS — S32010A Wedge compression fracture of first lumbar vertebra, initial encounter for closed fracture: Secondary | ICD-10-CM

## 2022-09-01 DIAGNOSIS — M25552 Pain in left hip: Secondary | ICD-10-CM

## 2022-09-01 DIAGNOSIS — S22080A Wedge compression fracture of T11-T12 vertebra, initial encounter for closed fracture: Secondary | ICD-10-CM

## 2022-09-08 ENCOUNTER — Ambulatory Visit
Admission: RE | Admit: 2022-09-08 | Discharge: 2022-09-08 | Disposition: A | Discharge status: Home / Self Care | Payer: Medicare Other | Source: Ambulatory Visit | Attending: Physician Assistant | Admitting: Physician Assistant

## 2022-09-08 ENCOUNTER — Other Ambulatory Visit: Payer: Self-pay | Admitting: Physician Assistant

## 2022-09-08 DIAGNOSIS — M7989 Other specified soft tissue disorders: Secondary | ICD-10-CM | POA: Diagnosis present

## 2023-03-15 IMAGING — CT CT CHEST-ABD-PELV W/ CM
2 of 5 series · 13 of 36 positions shown, 15 images · IV contrast (omnipaque)
Comparison: 10/31/2019

CLINICAL DATA: Rectal carcinoma.  Asymptomatic.  Surveillance.

EXAM:
CT CHEST, ABDOMEN, AND PELVIS WITH CONTRAST
TECHNIQUE: Multidetector CT imaging of the chest, abdomen and pelvis was
performed following the standard protocol during bolus
administration of intravenous contrast.
CONTRAST:  100mL OMNIPAQUE IOHEXOL 300 MG/ML  SOLN

[Series 2: cap with · axial · 0.80mm/px · z∈[-570,-54]mm · 10 of 127 slices shown, 12 images]
[im 12/127  mediastinal]
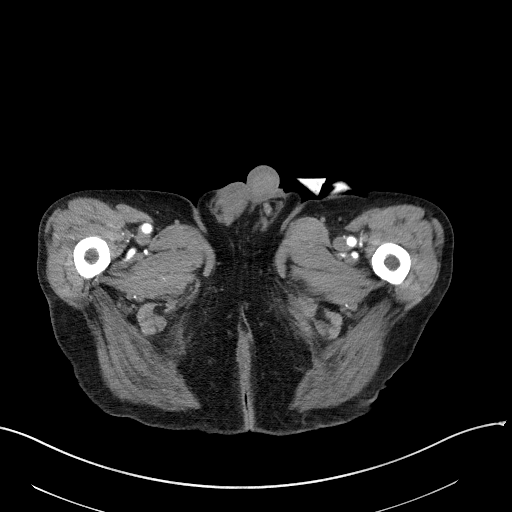
[im 12/127  bone]
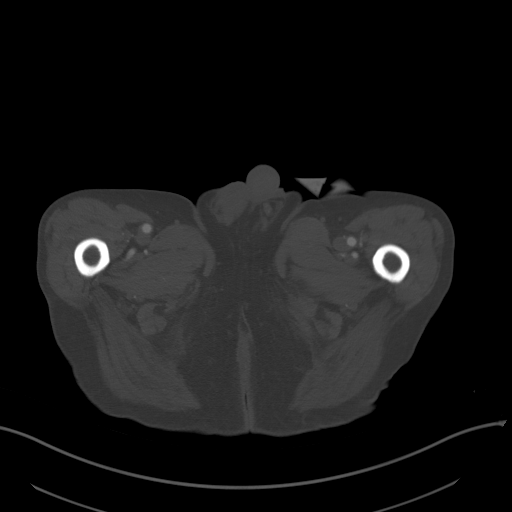
[im 23/127  mediastinal]
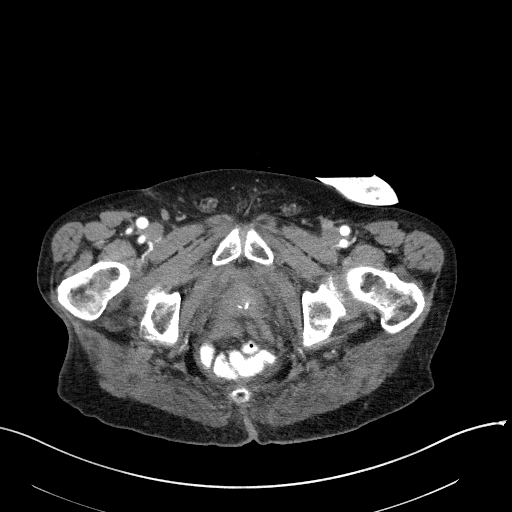
[im 35/127  mediastinal]
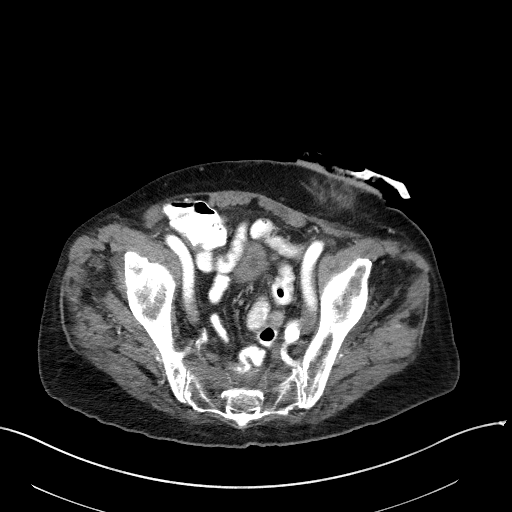
[im 46/127  mediastinal]
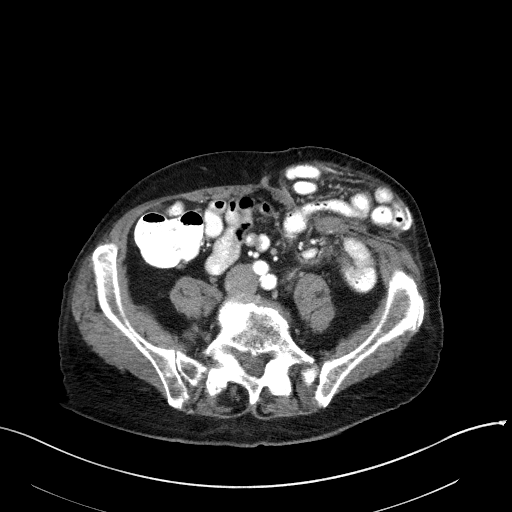
[im 58/127  mediastinal]
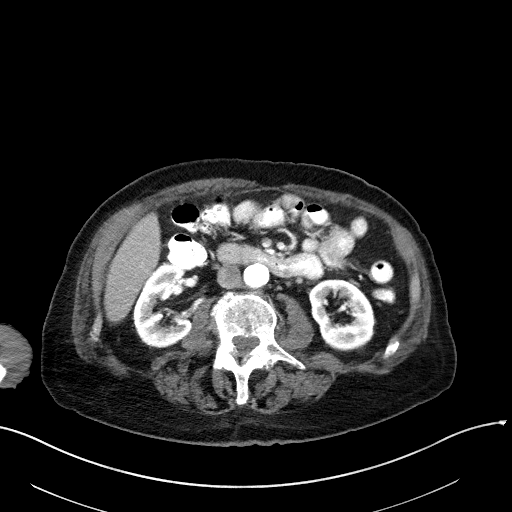
[im 69/127  mediastinal]
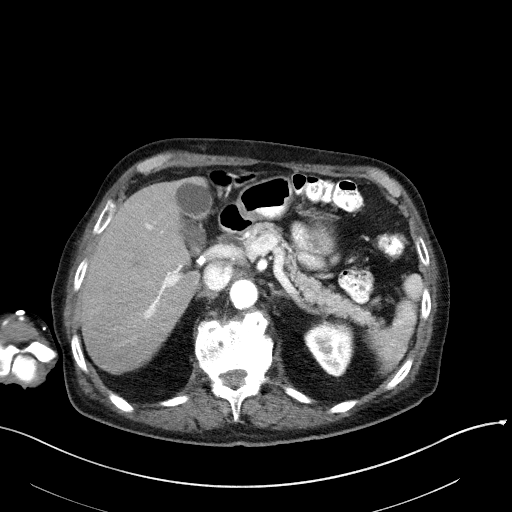
[im 81/127  mediastinal]
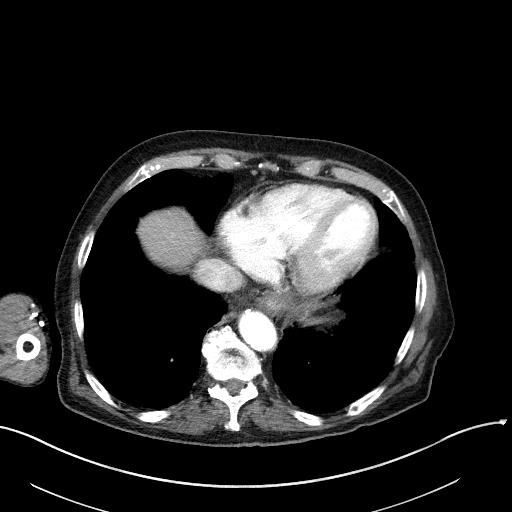
[im 92/127  mediastinal]
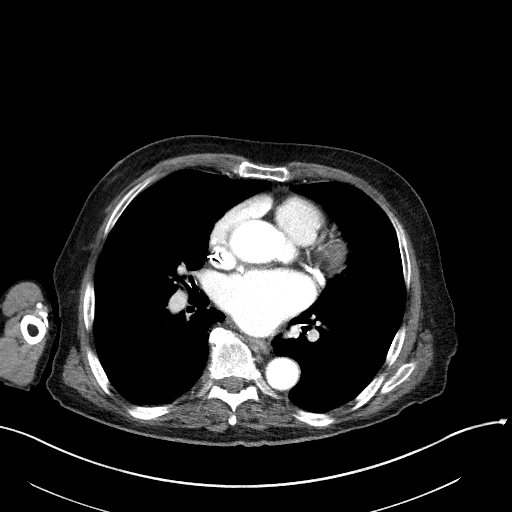
[im 104/127  mediastinal]
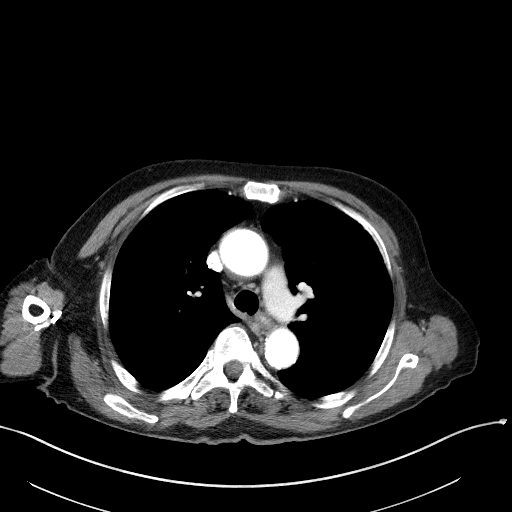
[im 104/127  bone]
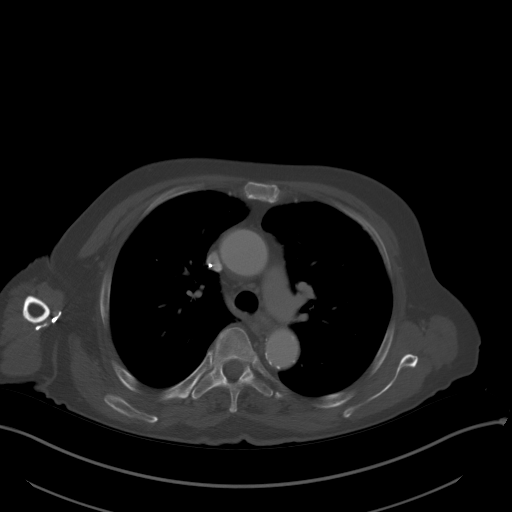
[im 115/127  mediastinal]
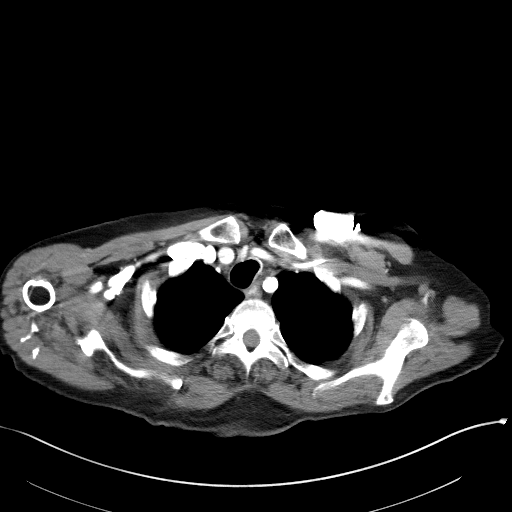

[Series 5: coronals · coronal · 0.71mm/px · 3 of 137 slices shown]
[im 28/137  mediastinal]
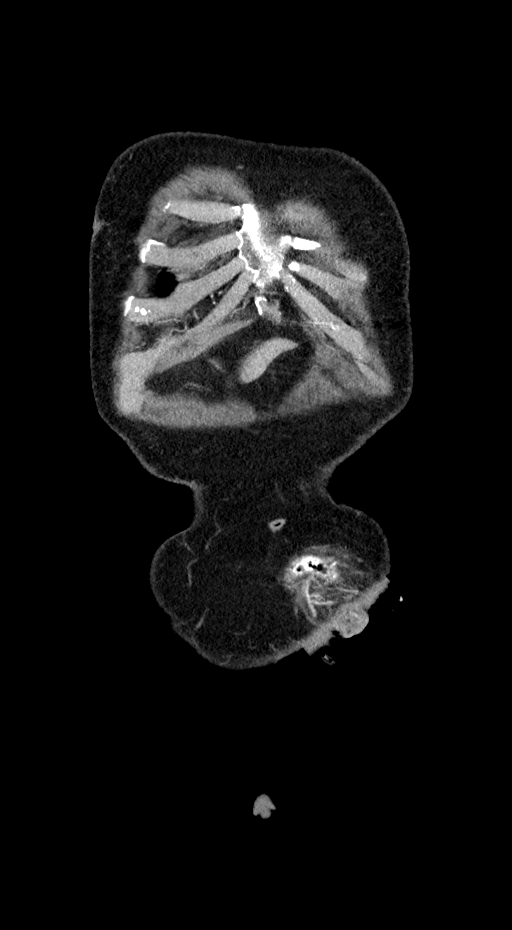
[im 55/137  mediastinal]
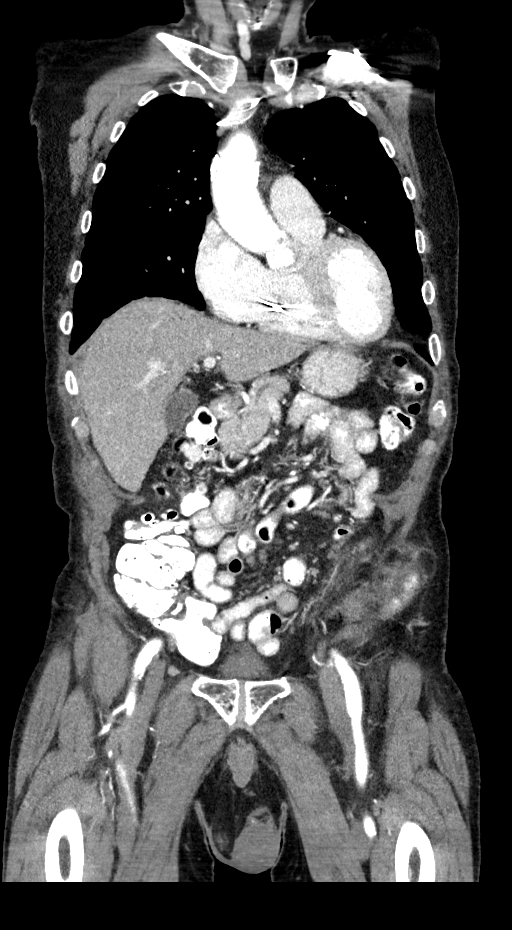
[im 82/137  mediastinal]
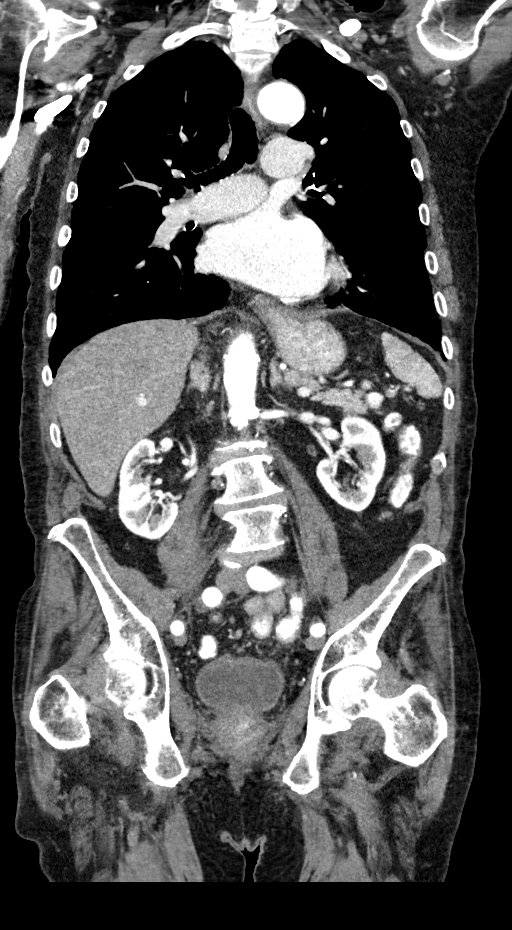

[13 of 36 positions shown; findings below may reference images not displayed]

FINDINGS: CT CHEST FINDINGS

Cardiovascular: Pacer with tip at right ventricle. Aortic
atherosclerosis. Tortuous thoracic aorta. Mild cardiomegaly. Three
vessel coronary artery calcification. No central pulmonary embolism,
on this non-dedicated study.

Mediastinum/Nodes: 8 mm left thyroid nodule Not clinically
significant; no follow-up imaging recommended No mediastinal or
hilar adenopathy. (ref: [HOSPITAL]. [DATE]): 143-50).

Lungs/Pleura: No pleural fluid.  mild centrilobular emphysema.

Mild reticulonodular opacity in the left upper lobe including on
61/4 is similar and likely post infectious or inflammatory scarring.

Musculoskeletal: Similar mild T12 compression deformity. Remote
bilateral rib fractures

CT ABDOMEN PELVIS FINDINGS

Hepatobiliary: Calcification in the liver is likely related to old
granulomatous disease. No suspicious liver lesion. Tiny gallstone.
No acute cholecystitis or biliary duct dilatation.

Pancreas: Normal, without mass or ductal dilatation.

Spleen: Normal in size, without focal abnormality.

Adrenals/Urinary Tract: Normal adrenal glands. Normal kidneys,
without hydronephrosis. The bladder appears mildly thick walled, but
is underdistended.

Stomach/Bowel: Proximal gastric underdistention. Status post
abdominal perineal resection with small bowel containing parastomal
hernia. No obstruction or other acute complication. Normal terminal
ileum. Otherwise normal small bowel.

Vascular/Lymphatic: Aortic atherosclerosis. No abdominopelvic
adenopathy.

Reproductive: Normal prostate.

Other: No significant free fluid. Presacral soft tissue thickening
is unchanged. No evidence of omental or peritoneal disease.

Musculoskeletal: Osteopenia. Lumbosacral spondylosis with grade 1
L4-5 anterolisthesis. Moderate L1 compression deformity with mild
ventral canal encroachment is similar. S-shaped thoracolumbar spine
curvature.
IMPRESSION: 1. Status post abdominal perineal resection, without evidence of
recurrent or metastatic disease.
2. Similar parastomal hernia containing nonobstructive small bowel.
3. Aortic atherosclerosis ([id number].N), coronary artery
atherosclerosis and emphysema ([id number].D).
4. Cholelithiasis.
5. Possible bladder wall thickening versus artifactual in the
setting of underdistention. Correlate with any symptoms of bladder
outlet obstruction or cystitis.

## 2023-05-15 ENCOUNTER — Encounter: Payer: Medicare Other | Attending: Physician Assistant | Admitting: Physician Assistant

## 2023-05-15 DIAGNOSIS — I10 Essential (primary) hypertension: Secondary | ICD-10-CM | POA: Diagnosis not present

## 2023-05-15 DIAGNOSIS — I480 Paroxysmal atrial fibrillation: Secondary | ICD-10-CM | POA: Diagnosis not present

## 2023-05-15 DIAGNOSIS — L97812 Non-pressure chronic ulcer of other part of right lower leg with fat layer exposed: Secondary | ICD-10-CM | POA: Insufficient documentation

## 2023-05-15 DIAGNOSIS — Z85040 Personal history of malignant carcinoid tumor of rectum: Secondary | ICD-10-CM | POA: Diagnosis not present

## 2023-05-15 DIAGNOSIS — Z7901 Long term (current) use of anticoagulants: Secondary | ICD-10-CM | POA: Insufficient documentation

## 2023-05-15 DIAGNOSIS — F01A18 Vascular dementia, mild, with other behavioral disturbance: Secondary | ICD-10-CM | POA: Diagnosis not present

## 2023-05-15 DIAGNOSIS — I87331 Chronic venous hypertension (idiopathic) with ulcer and inflammation of right lower extremity: Secondary | ICD-10-CM | POA: Insufficient documentation

## 2023-05-15 DIAGNOSIS — Z933 Colostomy status: Secondary | ICD-10-CM | POA: Insufficient documentation

## 2023-05-15 DIAGNOSIS — D538 Other specified nutritional anemias: Secondary | ICD-10-CM | POA: Diagnosis not present

## 2023-05-22 ENCOUNTER — Encounter: Payer: Medicare Other | Attending: Physician Assistant | Admitting: Physician Assistant

## 2023-05-22 DIAGNOSIS — I87331 Chronic venous hypertension (idiopathic) with ulcer and inflammation of right lower extremity: Secondary | ICD-10-CM | POA: Insufficient documentation

## 2023-05-22 DIAGNOSIS — Z7901 Long term (current) use of anticoagulants: Secondary | ICD-10-CM | POA: Insufficient documentation

## 2023-05-22 DIAGNOSIS — I10 Essential (primary) hypertension: Secondary | ICD-10-CM | POA: Insufficient documentation

## 2023-05-22 DIAGNOSIS — Z930 Tracheostomy status: Secondary | ICD-10-CM | POA: Diagnosis not present

## 2023-05-22 DIAGNOSIS — D538 Other specified nutritional anemias: Secondary | ICD-10-CM | POA: Insufficient documentation

## 2023-05-22 DIAGNOSIS — L97812 Non-pressure chronic ulcer of other part of right lower leg with fat layer exposed: Secondary | ICD-10-CM | POA: Insufficient documentation

## 2023-05-22 DIAGNOSIS — I480 Paroxysmal atrial fibrillation: Secondary | ICD-10-CM | POA: Insufficient documentation

## 2023-05-22 DIAGNOSIS — F10188 Alcohol abuse with other alcohol-induced disorder: Secondary | ICD-10-CM | POA: Insufficient documentation

## 2023-05-22 DIAGNOSIS — Z85040 Personal history of malignant carcinoid tumor of rectum: Secondary | ICD-10-CM | POA: Diagnosis not present

## 2023-05-22 DIAGNOSIS — F01A18 Vascular dementia, mild, with other behavioral disturbance: Secondary | ICD-10-CM | POA: Diagnosis not present

## 2023-05-22 DIAGNOSIS — Z09 Encounter for follow-up examination after completed treatment for conditions other than malignant neoplasm: Secondary | ICD-10-CM | POA: Diagnosis not present

## 2023-05-29 ENCOUNTER — Encounter: Payer: Medicare Other | Admitting: Physician Assistant

## 2023-05-29 DIAGNOSIS — Z09 Encounter for follow-up examination after completed treatment for conditions other than malignant neoplasm: Secondary | ICD-10-CM | POA: Diagnosis not present

## 2023-05-31 ENCOUNTER — Ambulatory Visit: Payer: Medicare Other | Admitting: Physician Assistant

## 2023-06-12 ENCOUNTER — Encounter: Payer: Medicare Other | Admitting: Physician Assistant

## 2023-06-12 DIAGNOSIS — Z09 Encounter for follow-up examination after completed treatment for conditions other than malignant neoplasm: Secondary | ICD-10-CM | POA: Diagnosis not present

## 2024-03-11 ENCOUNTER — Encounter: Payer: Self-pay | Admitting: Physical Therapy

## 2024-03-11 ENCOUNTER — Other Ambulatory Visit: Payer: Self-pay

## 2024-03-11 ENCOUNTER — Ambulatory Visit: Attending: Family Medicine | Admitting: Physical Therapy

## 2024-03-11 DIAGNOSIS — Z9181 History of falling: Secondary | ICD-10-CM | POA: Diagnosis present

## 2024-03-11 DIAGNOSIS — R2681 Unsteadiness on feet: Secondary | ICD-10-CM | POA: Insufficient documentation

## 2024-03-11 DIAGNOSIS — R269 Unspecified abnormalities of gait and mobility: Secondary | ICD-10-CM | POA: Diagnosis present

## 2024-03-11 DIAGNOSIS — M6281 Muscle weakness (generalized): Secondary | ICD-10-CM | POA: Diagnosis present

## 2024-03-11 DIAGNOSIS — R296 Repeated falls: Secondary | ICD-10-CM | POA: Diagnosis present

## 2024-03-11 DIAGNOSIS — R293 Abnormal posture: Secondary | ICD-10-CM | POA: Diagnosis present

## 2024-03-11 NOTE — Therapy (Signed)
 OUTPATIENT PHYSICAL THERAPY NEURO EVALUATION   Patient Name: Kenneth Luna. MRN: [id number] DOB:July 01, 1932, 89 y.o., male Today's Date: 03/11/2024   PCP: Alla Amis, MD  REFERRING PROVIDER: Alla Amis, MD   END OF SESSION:  PT End of Session - 03/11/24 1138     Visit Number 1    Number of Visits 24    Date for Recertification  06/03/24    Progress Note Due on Visit 10    PT Start Time 1142    PT Stop Time 1236    PT Time Calculation (min) 54 min    Equipment Utilized During Treatment Gait belt    Activity Tolerance Patient tolerated treatment well;No increased pain    Behavior During Therapy [facility] for tasks assessed/performed          Past Medical History:  Diagnosis Date   Allergic rhinitis    Anemia    B12 deficiency 06/29/2015   Benign neoplasm of ascending colon    Benign neoplasm of descending colon    Colon polyp    Colostomy in [facility])    DD (diverticular disease) 06/29/2015   Dementia (HCC)    memory issues   Fatty liver    GERD (gastroesophageal reflux disease)    Glaucoma    Hyperlipidemia    Hypertension    Rectal adenocarcinoma (HCC) 07/15/2015   Substance abuse (HCC)    alcohol   Traumatic intraparenchymal hemorrhage (HCC)    Past Surgical History:  Procedure Laterality Date   ABDOMINOPERINEAL PROCTOCOLECTOMY  12/17/2015   [facility]   CATARACT EXTRACTION W/ INTRAOCULAR LENS IMPLANT     COLONOSCOPY WITH PROPOFOL  N/A 07/10/2015   Procedure: COLONOSCOPY WITH PROPOFOL ;  Surgeon: Rogelia Copping, MD;  Location: [facility];  Service: Endoscopy;  Laterality: N/A;  PER CAREGIVER WAS TOLD THAT PT WOULD BE 1ST (KEEP PT EARLY AM)   COLONOSCOPY WITH PROPOFOL  N/A 04/24/2017   Procedure: COLONOSCOPY WITH PROPOFOL ;  Surgeon: Copping Rogelia, MD;  Location: [facility];  Service: Endoscopy;  Laterality: N/A;   INSERT / REPLACE / REMOVE PACEMAKER  01/08/2020   PACEMAKER INSERTION Left 01/07/2020   Procedure: INSERTION PACEMAKER;   Surgeon: Ammon Blunt, MD;  Location: [facility] ORS;  Service: Cardiovascular;  Laterality: Left;   PACEMAKER LEADLESS INSERTION N/A 01/07/2020   Procedure: PACEMAKER LEADLESS INSERTION;  Surgeon: Ammon Blunt, MD;  Location: [facility] INVASIVE CV LAB;  Service: Cardiovascular;  Laterality: N/A;   PERIPHERAL VASCULAR BALLOON ANGIOPLASTY N/A 01/07/2020   Procedure: PERIPHERAL VASCULAR BALLOON ANGIOPLASTY;  Surgeon: Marea Selinda RAMAN, MD;  Location: [facility] INVASIVE CV LAB;  Service: Cardiovascular;  Laterality: N/A;   PERIPHERAL VASCULAR BALLOON ANGIOPLASTY N/A 01/07/2020   Procedure: PERIPHERAL VASCULAR BALLOON ANGIOPLASTY;  Surgeon: Jama Cordella MATSU, MD;  Location: [facility] INVASIVE CV LAB;  Service: Cardiovascular;  Laterality: N/A;   PERIPHERAL VASCULAR THROMBECTOMY Right 01/20/2020   Procedure: PERIPHERAL VASCULAR THROMBECTOMY;  Surgeon: Marea Selinda RAMAN, MD;  Location: [facility] INVASIVE CV LAB;  Service: Cardiovascular;  Laterality: Right;   POLYPECTOMY  07/10/2015   Procedure: POLYPECTOMY;  Surgeon: Rogelia Copping, MD;  Location: [facility];  Service: Endoscopy;;   Patient Active Problem List   Diagnosis Date Noted   Chronic right shoulder pain 04/29/2020   Colostomy in place [facility]) 04/29/2020   Acute deep vein thrombosis (DVT) of femoral vein (HCC) 01/17/2020   DVT (deep venous thrombosis) (HCC) 01/14/2020   Sick sinus syndrome (HCC) 01/07/2020   Cardiac syncope 12/25/2019   Persistent atrial fibrillation (HCC) 11/15/2019  GERD without esophagitis 04/25/2018   Parastomal hernia without obstruction or gangrene 06/19/2017   Personal history of colon cancer    Benign neoplasm of ascending colon    Allergic rhinitis 01/30/2017   Encounter for general adult medical examination without abnormal findings 10/03/2016   Vaccine counseling 10/03/2016   Palmar plantar erythrodysesthesia 04/17/2016   Iron deficiency anemia 03/07/2016   Complete tear of right rotator cuff 11/16/2015   Rotator cuff  tendinitis, right 11/16/2015   Hyponatremia 10/09/2015   Rash of face 10/09/2015   Intracranial hemorrhage (HCC) 08/18/2015   Low back pain 08/18/2015   T12 compression fracture (HCC) 08/10/2015   Acute diverticulitis 08/08/2015   Generalized weakness 08/08/2015   Alcohol dependence (HCC) 08/08/2015   Diverticulitis 08/08/2015   Intraparenchymal hemorrhage of brain (HCC) 07/24/2015   Rectal adenocarcinoma (HCC) 07/15/2015   B12 deficiency 06/29/2015   Dementia (HCC) 06/29/2015   DD (diverticular disease) 06/29/2015   Essential (primary) hypertension 06/29/2015   GI bleed 06/23/2015    ONSET DATE: over the past couple years   REFERRING DIAG:  Z91.81 (ICD-10-CM) - At high risk for falls    THERAPY DIAG:  Abnormality of gait  Abnormal posture  Repeated falls  History of falling  Muscle weakness (generalized)  Unsteadiness on feet  Rationale for Evaluation and Treatment: Rehabilitation  SUBJECTIVE:                                                                                                                                                                                             SUBJECTIVE STATEMENT: Caregiver reports pt had a fall and needed surgery due to infection this past December, and has been less mobile since then. Caregiver states pt has minor falls and stumbling regularly. Pt uses SPC most of the time, but has a 2WW which he uses around the home. Pt attended home health physical therapy for a while, where he was encouraged to attend outpatient therapy to get outside of the home environment. Careg[facility] and used [facility] often. Pt is currently able to wash his dishes, cook meals, and fold clothes independently. Pt lives in a home where he has 24/7 access to aid and care. Pt accompanied by: caregiver, answers most questions   PERTINENT HISTORY:  From recent routine medica visit on  03/05/2024: Kenneth Luna. is a 89 y.o. here for preventative health exam and subsequent medicare wellness  Preventative health exam: Still has moments of weakness and instability and would like to follow-up with physical therapy. States that physical therapy never  contacted them. Chronic medical issues stable and tolerating medications without adverse effects. Still receiving 24-hour care and supervision. Still cutting down alcohol to 1 bottle of wine per week and doing well with this. Exercises regularly and tries to adhere to healthy diet. No exertional cp or syncopal episodes. No urinary issues or rectal pain/bleeding. Denies any tobacco use.  ROS Review of systems is unremarkable for any active cardiac, respiratory, GI, GU, hematologic, neurologic, dermatologic, HEENT, or psychiatric symptoms except as noted above. No fevers, chills, or constitutional symptoms.  PAIN:  Are you having pain? No  PRECAUTIONS: None  RED FLAGS: None   WEIGHT BEARING RESTRICTIONS: No  FALLS: Has patient fallen in last 6 months? Yes. Number of falls 2 and tripped over the carpet to floor transition  LIVING ENVIRONMENT: Lives with: lives in an assisted living facility, has 24/7 care available Lives in: House/apartment Stairs: No Has following equipment at home: Single point cane and Walker - 2 wheeled  PLOF: Independent  PATIENT GOALS: Getting stronger and preventing falls.  OBJECTIVE:  Note: Objective measures were completed at Evaluation unless otherwise noted.  COGNITION: Overall cognitive status: History of cognitive impairments - at baseline and mild dementia, caregiver answers most questions.   SENSATION: WFL Light touch: WFL B LE  COORDINATION: RAMPS: WFL B H<>S: WFL B  POSTURE: rounded shoulders, forward head, increased thoracic kyphosis, and flexed trunk   LOWER EXTREMITY MMT:    MMT Right Eval Left Eval  Hip flexion 4+ 4+  Hip extension    Hip abduction 5 5  Hip  adduction 5 5  Hip internal rotation    Hip external rotation    Knee flexion 4+ 4+  Knee extension 5 5  Ankle dorsiflexion 4+ 4+  Ankle plantarflexion    Ankle inversion    Ankle eversion    (Blank rows = not tested)   TRANSFERS: Sit to stand: Complete Independence  Assistive device utilized: Single point cane     Stand to sit: Complete Independence  Assistive device utilized: Single point cane      STAIRS: Not tested GAIT: Findings: Gait Characteristics: decreased step length- Right, decreased step length- Left, decreased stance time- Right, decreased stance time- Left, decreased stride length, decreased trunk rotation, trunk flexed, and wide BOS, Distance walked: distance into and out of gym, distance of functional outcome measures performed this session, Assistive device utilized:Single point cane, Level of assistance: CGA, and Comments: ambulates with SPC improperly  FUNCTIONAL TESTS:  -Pt performed 5 time sit<>stand (5xSTS): 12.56 sec (>15 sec indicates increased fall risk)   -PT instructed pt in TUG: 16.105 sec (average of 3 trials; >13.5 sec indicates increased fall risk)  (trial 1: 15.96s, trial 2: 16.25s)  -6 minute walk test: TEST NEXT SESSION  -10 Meter Walk Test: Patient instructed to walk 10 meters (32.8 ft) as quickly and as safely as possible at their normal speed x2 and at a fast speed x2. Time measured from 2 meter mark to 8 meter mark to accommodate ramp-up and ramp-down.  Normal speed 1: 0.79 m/s, 12.63s (limited community ambulator) Normal speed 2: 0.77 m/s, 12.98s (limited community ambulator)  Cut off scores: <0.4 m/s = household Ambulator, 0.4-0.8 m/s = limited community Ambulator, >0.8 m/s = community Ambulator, >1.2 m/s = crossing a street, <1.0 = increased fall risk MCID 0.05 m/s (small), 0.13 m/s (moderate), 0.06 m/s (significant)  (ANPTA Core Set of Outcome Measures for Adults with Neurologic Conditions, 2018)   Melvin Balance Scale: Patient  demonstrates  increased fall risk as noted by score of 41/56 on Berg Balance Scale.  (<36= high risk for falls, close to 100%; 37-45 significant >80%; 46-51 moderate >50%; 52-55 lower >25%)  Item Test date: 03/11/2024  Date:  Date:   Sitting to standing 4. able to stand without using hands and stabilize independently Insert SmartPhrase OPRCBERGREEVAL Insert SmartPhrase OPRCBERGREEVAL  2. Standing unsupported 3. able to stand 2 minutes with supervision    3. Sitting with back unsupported, feet supported 4. able to sit safely and securely for 2 minutes    4. Standing to sitting 4. sits safely with minimal use of hands    5. Pivot transfer  4. able to transfer safely with minor use of hands    6. Standing unsupported with eyes closed 3. able to stand 10 seconds with supervision    7. Standing unsupported with feet together 3. able to place feet together independently and stand 1 minute with supervision    8. Reaching forward with outstretched arms while standing 3. can reach forward 12 cm (5 inches)    9. Pick up object from the floor from standing 4. able to pick up slipper safely and easily    10. Turning to look behind over left and right shoulders while standing 2. turns sideways but only maintains balance    11. Turn 360 degrees 2. able to turn 360 degrees safely but slowly    12. Place alternate foot on step or stool while standing unsupported 1. able to complete > 2 steps needs minimal assist    13. Standing unsupported one foot in front 3. able to place foot ahead independently and hold 30 seconds    14. Standing on one leg 1. tries to lift leg unable to hold 3 seconds but remains standing independently.      Total Score 41/56 Total Score:    Total Score:      PATIENT SURVEYS:  ABC scale: The Activities-Specific Balance Confidence (ABC) Scale 0% 10 20 30  40 50 60 70 80 90 100% No confidence<->completely confident  "How confident are you that you will not lose your balance or become  unsteady when you . . .   Date tested 03/11/2024   Walk around the house 100%  2. Walk up or down stairs 100%  3. Bend over and pick up a slipper from in front of a closet floor 100%  4. Reach for a small can off a shelf at eye level 100%  5. Stand on tip toes and reach for something above your head 50%  6. Stand on a chair and reach for something 20%  7. Sweep the floor 100%  8. Walk outside the house to a car parked in the driveway 100%  9. Get into or out of a car 100%  10. Walk across a parking lot to the mall 100%  11. Walk up or down a ramp 90%  12. Walk in a crowded mall where people rapidly walk past you 100%  13. Are bumped into by people as you walk through the mall 100%  14. Step onto or off of an escalator while you are holding onto the railing 65%  15. Step onto or off an escalator while holding onto parcels such that you cannot hold onto the railing 50%  16. Walk outside on icy sidewalks 20%  Total: #/16  80.94%  TREATMENT DATE: 03/11/2024   PATIENT EDUCATION: Education details: Educated in performance of HEP program, timing of therapy session, goals and impairments Person educated: Patient and Caregiver   Education method: Explanation, Demonstration, and Handouts Education comprehension: verbalized understanding and returned demonstration  HOME EXERCISE PROGRAM: Access Code: [email or web address] URL: [email or web address] Date: 03/11/2024 Prepared by: Massie Dollar  Exercises - Standing March with Counter Support  - 1 x daily - 7 x weekly - 3 sets - 10 reps - 3 second hold - Heel Toe Raises with Counter Support  - 1 x daily - 7 x weekly - 3 sets - 10 reps - 3 second hold  GOALS: Goals reviewed with patient? No   SHORT TERM GOALS: Target date: 04/08/2024  Patient will be independent in home exercise program to improve  strength/mobility for better functional independence with ADLs. Baseline: Goal status: INITIAL   LONG TERM GOALS: Target date: 06/03/2024   Patient will increase ABC scale score >90% to demonstrate better functional mobility and better confidence with ADLs.  Baseline: 80.9375% Goal status: INITIAL  2.  Patient (> 57 years old) will complete five times sit to stand test in < 11 seconds indicating an increased LE strength and improved balance. Baseline: 12.56s Goal status: INITIAL  3.  Patient will increase Berg Balance score by > 6 points to demonstrate decreased fall risk during functional activities Baseline: 41/56 Goal status: INITIAL  4.  Patient will increase 10 meter walk test to >1.51m/s as to improve gait speed for better community ambulation and to reduce fall risk. Baseline: 0.78 m/s Goal status: INITIAL  5.  Patient will reduce timed up and go to <13 seconds to reduce fall risk and demonstrate improved transfer/gait ability. Baseline: 16.105s Goal status: INITIAL  6. Patient will improve by 19ft in order to improve ambulation distance and increase endurance for community ambulation. Baseline: next session* Goal status: INITIAL  ASSESSMENT:  CLINICAL IMPRESSION: Patient is a 89 y.o. male who was seen today for physical therapy evaluation and treatment for increased risk of falls. Pt is noted to have mild dementia and memory deficits, with caregiver answering most questions regarding medical history and limitations. Pt shows great B LE strength, matching the caregiver's reported [facility] Pt reported goals of wanting to decrease risk of future falls and injury and to increase his overall functional mobility. Pt TUG score of 16.105s indicated an increased risk for falls. Pt gait speed seen by as 0.78 m/s, classifying pt as a limited community ambulator. Patient demonstrates significant increased fall risk as noted by score of  41/56  on Berg Balance Scale, noting most difficulty w/ turning, single leg stance, standing taps onto step, and turning to look behind. Pt coordination and sensation all WFL. Pt shows improper usage of SPC and would benefit from AD ambulation training with SPC to increase confidence and stability with gait. Pt shows adequate 5STS score of 12.56s with no use of UE support for activity. Follow up appointment to include stair/curbs evaluation and endurance evaluation using . Pt instructed in, verbalized understanding, and demonstrated HEP exercises for proper form and safety. Pt will continue to benefit from skilled therapy to address remaining deficits in order to improve overall QoL and return to PLOF.    OBJECTIVE IMPAIRMENTS: Abnormal gait, decreased activity tolerance, decreased balance, decreased cognition, decreased endurance, decreased knowledge of use of DME, decreased mobility, difficulty walking, impaired UE functional use, improper body mechanics, and postural dysfunction.  ACTIVITY LIMITATIONS: carrying, lifting, stairs, reach over head, and hygiene/grooming  PARTICIPATION LIMITATIONS: medication management, community activity, and yard work  PERSONAL FACTORS: Age, Past/current experiences, Time since onset of injury/illness/exacerbation, and 3+ comorbidities: hx of cancer, mild dementia associated w/ alcoholism, chronic atrial fibrillation are also affecting patient's functional outcome.   REHAB POTENTIAL: Good  CLINICAL DECISION MAKING: Evolving/moderate complexity  EVALUATION COMPLEXITY: Moderate  PLAN:  PT FREQUENCY: 2x/week  PT DURATION: 12 weeks  PLANNED INTERVENTIONS: [id number]- PT Re-evaluation, 97750- Physical Performance Testing, 97110-Therapeutic exercises, 97530- Therapeutic activity, 97112- Neuromuscular re-education, 97535- Self Care, [id number]- Manual therapy, [phone]- Gait training, [phone]- Orthotic Initial, [phone]- Orthotic/Prosthetic subsequent, [phone]- Aquatic Therapy, [phone]-  Electrical stimulation (unattended), [phone] (1-2 muscles), [phone] (3+ muscles)- Dry Needling, Patient/Family education, Balance training, Stair training, Taping, Joint mobilization, Joint manipulation, Spinal mobilization, Vestibular training, DME instructions, Wheelchair mobility training, Cryotherapy, and Moist heat  PLAN FOR NEXT SESSION:   -complete 6 MWT -look at curb/stair navigation -expand HEP -work on proper use and mechanics of SPC, look at [employer], SPT 03/11/2024, 1:36 PM

## 2024-03-19 ENCOUNTER — Ambulatory Visit: Attending: Family Medicine | Admitting: Physical Therapy

## 2024-03-19 DIAGNOSIS — M6281 Muscle weakness (generalized): Secondary | ICD-10-CM | POA: Diagnosis present

## 2024-03-19 DIAGNOSIS — R293 Abnormal posture: Secondary | ICD-10-CM | POA: Insufficient documentation

## 2024-03-19 DIAGNOSIS — R269 Unspecified abnormalities of gait and mobility: Secondary | ICD-10-CM | POA: Diagnosis present

## 2024-03-19 DIAGNOSIS — R296 Repeated falls: Secondary | ICD-10-CM | POA: Diagnosis present

## 2024-03-19 DIAGNOSIS — R2681 Unsteadiness on feet: Secondary | ICD-10-CM | POA: Diagnosis present

## 2024-03-19 DIAGNOSIS — Z9181 History of falling: Secondary | ICD-10-CM | POA: Diagnosis present

## 2024-03-19 NOTE — Therapy (Signed)
 OUTPATIENT PHYSICAL THERAPY NEURO EVALUATION   Patient Name: Kenneth Luna. MRN: [id number] DOB:17-Mar-1933, 89 y.o., male Today's Date: 03/19/2024   PCP: Kenneth Amis, MD  REFERRING PROVIDER: Alla Amis, MD   END OF SESSION:  PT End of Session - 03/19/24 1451     Visit Number 2    Number of Visits 24    Date for Recertification  06/03/24    Progress Note Due on Visit 10    PT Start Time 1451    PT Stop Time 1530    PT Time Calculation (min) 39 min    Equipment Utilized During Treatment Gait belt    Activity Tolerance Patient tolerated treatment well;No increased pain    Behavior During Therapy [facility] for tasks assessed/performed          Past Medical History:  Diagnosis Date   Allergic rhinitis    Anemia    B12 deficiency 06/29/2015   Benign neoplasm of ascending colon    Benign neoplasm of descending colon    Colon polyp    Colostomy in place [facility])    DD (diverticular disease) 06/29/2015   Dementia (HCC)    memory issues   Fatty liver    GERD (gastroesophageal reflux disease)    Glaucoma    Hyperlipidemia    Hypertension    Rectal adenocarcinoma (HCC) 07/15/2015   Substance abuse (HCC)    alcohol   Traumatic intraparenchymal hemorrhage (HCC)    Past Surgical History:  Procedure Laterality Date   ABDOMINOPERINEAL PROCTOCOLECTOMY  12/17/2015   [facility]   CATARACT EXTRACTION W/ INTRAOCULAR LENS IMPLANT     COLONOSCOPY WITH PROPOFOL  N/A 07/10/2015   Procedure: COLONOSCOPY WITH PROPOFOL ;  Surgeon: Kenneth Copping, MD;  Location: [facility];  Service: Endoscopy;  Laterality: N/A;  PER CAREGIVER WAS TOLD THAT PT WOULD BE 1ST (KEEP PT EARLY AM)   COLONOSCOPY WITH PROPOFOL  N/A 04/24/2017   Procedure: COLONOSCOPY WITH PROPOFOL ;  Surgeon: Luna Rogelia, MD;  Location: [facility];  Service: Endoscopy;  Laterality: N/A;   INSERT / REPLACE / REMOVE PACEMAKER  01/08/2020   PACEMAKER INSERTION Left 01/07/2020   Procedure: INSERTION PACEMAKER;   Surgeon: Kenneth Blunt, MD;  Location: [facility] ORS;  Service: Cardiovascular;  Laterality: Left;   PACEMAKER LEADLESS INSERTION N/A 01/07/2020   Procedure: PACEMAKER LEADLESS INSERTION;  Surgeon: Kenneth Blunt, MD;  Location: [facility] INVASIVE CV LAB;  Service: Cardiovascular;  Laterality: N/A;   PERIPHERAL VASCULAR BALLOON ANGIOPLASTY N/A 01/07/2020   Procedure: PERIPHERAL VASCULAR BALLOON ANGIOPLASTY;  Surgeon: Kenneth Selinda RAMAN, MD;  Location: [facility] INVASIVE CV LAB;  Service: Cardiovascular;  Laterality: N/A;   PERIPHERAL VASCULAR BALLOON ANGIOPLASTY N/A 01/07/2020   Procedure: PERIPHERAL VASCULAR BALLOON ANGIOPLASTY;  Surgeon: Kenneth Cordella MATSU, MD;  Location: [facility] INVASIVE CV LAB;  Service: Cardiovascular;  Laterality: N/A;   PERIPHERAL VASCULAR THROMBECTOMY Right 01/20/2020   Procedure: PERIPHERAL VASCULAR THROMBECTOMY;  Surgeon: Kenneth Selinda RAMAN, MD;  Location: [facility] INVASIVE CV LAB;  Service: Cardiovascular;  Laterality: Right;   POLYPECTOMY  07/10/2015   Procedure: POLYPECTOMY;  Surgeon: Kenneth Copping, MD;  Location: [facility];  Service: Endoscopy;;   Patient Active Problem List   Diagnosis Date Noted   Chronic right shoulder pain 04/29/2020   Colostomy in place [facility]) 04/29/2020   Acute deep vein thrombosis (DVT) of femoral vein (HCC) 01/17/2020   DVT (deep venous thrombosis) (HCC) 01/14/2020   Sick sinus syndrome (HCC) 01/07/2020   Cardiac syncope 12/25/2019   Persistent atrial fibrillation (HCC) 11/15/2019  GERD without esophagitis 04/25/2018   Parastomal hernia without obstruction or gangrene 06/19/2017   Personal history of colon cancer    Benign neoplasm of ascending colon    Allergic rhinitis 01/30/2017   Encounter for general adult medical examination without abnormal findings 10/03/2016   Vaccine counseling 10/03/2016   Palmar plantar erythrodysesthesia 04/17/2016   Iron deficiency anemia 03/07/2016   Complete tear of right rotator cuff 11/16/2015   Rotator cuff  tendinitis, right 11/16/2015   Hyponatremia 10/09/2015   Rash of face 10/09/2015   Intracranial hemorrhage (HCC) 08/18/2015   Low back pain 08/18/2015   T12 compression fracture (HCC) 08/10/2015   Acute diverticulitis 08/08/2015   Generalized weakness 08/08/2015   Alcohol dependence (HCC) 08/08/2015   Diverticulitis 08/08/2015   Intraparenchymal hemorrhage of brain (HCC) 07/24/2015   Rectal adenocarcinoma (HCC) 07/15/2015   B12 deficiency 06/29/2015   Dementia (HCC) 06/29/2015   DD (diverticular disease) 06/29/2015   Essential (primary) hypertension 06/29/2015   GI bleed 06/23/2015    ONSET DATE: over the past couple years   REFERRING DIAG:  Z91.81 (ICD-10-CM) - At high risk for falls    THERAPY DIAG:  No diagnosis found.  Rationale for Evaluation and Treatment: Rehabilitation  SUBJECTIVE:                                                                                                                                                                                             SUBJECTIVE STATEMENT: Pt reports no pain today. Says he had a good Thanksgiving. Says he does not remember his home exercises, caregiver states he has not done them.  Pt accompanied by: caregiver, answers most questions   PERTINENT HISTORY:  From recent routine medica visit on 03/05/2024: Kenneth Luna. is a 89 y.o. here for preventative health exam and subsequent medicare wellness  Preventative health exam: Still has moments of weakness and instability and would like to follow-up with physical therapy. States that physical therapy never contacted them. Chronic medical issues stable and tolerating medications without adverse effects. Still receiving 24-hour care and supervision. Still cutting down alcohol to 1 bottle of wine per week and doing well with this. Exercises regularly and tries to adhere to healthy diet. No exertional cp or syncopal episodes. No urinary issues or rectal pain/bleeding.  Denies any tobacco use.  ROS Review of systems is unremarkable for any active cardiac, respiratory, GI, GU, hematologic, neurologic, dermatologic, HEENT, or psychiatric symptoms except as noted above. No fevers, chills, or constitutional symptoms.  PAIN:  Are you having pain? No  PRECAUTIONS: None  RED FLAGS: None   WEIGHT BEARING RESTRICTIONS: No  FALLS: Has patient fallen in last 6 months? Yes. Number of falls 2 and tripped over the carpet to floor transition  LIVING ENVIRONMENT: Lives with: lives in an assisted living facility, has 24/7 care available Lives in: House/apartment Stairs: No Has following equipment at home: Single point cane and Walker - 2 wheeled  PLOF: Independent  PATIENT GOALS: Getting stronger and preventing falls.  OBJECTIVE:  Note: Objective measures were completed at Evaluation unless otherwise noted.  COGNITION: Overall cognitive status: History of cognitive impairments - at baseline and mild dementia, caregiver answers most questions.   SENSATION: WFL Light touch: WFL B LE  COORDINATION: RAMPS: WFL B H<>S: WFL B  POSTURE: rounded shoulders, forward head, increased thoracic kyphosis, and flexed trunk   LOWER EXTREMITY MMT:    MMT Right Eval Left Eval  Hip flexion 4+ 4+  Hip extension    Hip abduction 5 5  Hip adduction 5 5  Hip internal rotation    Hip external rotation    Knee flexion 4+ 4+  Knee extension 5 5  Ankle dorsiflexion 4+ 4+  Ankle plantarflexion    Ankle inversion    Ankle eversion    (Blank rows = not tested)   TRANSFERS: Sit to stand: Complete Independence  Assistive device utilized: Single point cane     Stand to sit: Complete Independence  Assistive device utilized: Single point cane      STAIRS: Not tested GAIT: Findings: Gait Characteristics: decreased step length- Right, decreased step length- Left, decreased stance time- Right, decreased stance time- Left, decreased stride length, decreased trunk  rotation, trunk flexed, and wide BOS, Distance walked: distance into and out of gym, distance of functional outcome measures performed this session, Assistive device utilized:Single point cane, Level of assistance: CGA, and Comments: ambulates with SPC improperly  FUNCTIONAL TESTS:  -Pt performed 5 time sit<>stand (5xSTS): 12.56 sec (>15 sec indicates increased fall risk)   -PT instructed pt in TUG: 16.105 sec (average of 3 trials; >13.5 sec indicates increased fall risk)  (trial 1: 15.96s, trial 2: 16.25s)  -6 minute walk test: 6 Min Walk Test:  Instructed patient to ambulate as quickly and as safely as possible for 6 minutes using LRAD. Patient was allowed to take standing rest breaks without stopping the test, but if the patient required a sitting rest break the clock would be stopped and the test would be over.  Results: 825 feet (251.46 meters, Avg speed 0.65m/s) using a SPC with CGA. Results indicate that the patient has reduced endurance with ambulation compared to age matched norms.  Age Matched Norms: 21-69 yo M: 80 F: 56, 87-79 yo M: 7 F: 471, 66-89 yo M: 417 F: 392 MDC: 58.21 meters (190.98 feet) or 50 meters (ANPTA Core Set of Outcome Measures for Adults with Neurologic Conditions, 2018)   -10 Meter Walk Test: Patient instructed to walk 10 meters (32.8 ft) as quickly and as safely as possible at their normal speed x2 and at a fast speed x2. Time measured from 2 meter mark to 8 meter mark to accommodate ramp-up and ramp-down.  Normal speed 1: 0.79 m/s, 12.63s (limited community ambulator) Normal speed 2: 0.77 m/s, 12.98s (limited community ambulator)  Cut off scores: <0.4 m/s = household Ambulator, 0.4-0.8 m/s = limited community Ambulator, >0.8 m/s = community Ambulator, >1.2 m/s = crossing a street, <1.0 = increased fall risk MCID 0.05 m/s (small), 0.13 m/s (moderate), 0.06 m/s (significant)  (ANPTA Core Set of Outcome Measures for Adults with Neurologic  Conditions, 2018)    -Berg Balance Scale: Patient demonstrates increased fall risk as noted by score of 41/56 on Berg Balance Scale.  (<36= high risk for falls, close to 100%; 37-45 significant >80%; 46-51 moderate >50%; 52-55 lower >25%)  Item Test date: 03/19/2024  Date:  Date:   Sitting to standing 4. able to stand without using hands and stabilize independently Insert SmartPhrase OPRCBERGREEVAL Insert SmartPhrase OPRCBERGREEVAL  2. Standing unsupported 3. able to stand 2 minutes with supervision    3. Sitting with back unsupported, feet supported 4. able to sit safely and securely for 2 minutes    4. Standing to sitting 4. sits safely with minimal use of hands    5. Pivot transfer  4. able to transfer safely with minor use of hands    6. Standing unsupported with eyes closed 3. able to stand 10 seconds with supervision    7. Standing unsupported with feet together 3. able to place feet together independently and stand 1 minute with supervision    8. Reaching forward with outstretched arms while standing 3. can reach forward 12 cm (5 inches)    9. Pick up object from the floor from standing 4. able to pick up slipper safely and easily    10. Turning to look behind over left and right shoulders while standing 2. turns sideways but only maintains balance    11. Turn 360 degrees 2. able to turn 360 degrees safely but slowly    12. Place alternate foot on step or stool while standing unsupported 1. able to complete > 2 steps needs minimal assist    13. Standing unsupported one foot in front 3. able to place foot ahead independently and hold 30 seconds    14. Standing on one leg 1. tries to lift leg unable to hold 3 seconds but remains standing independently.      Total Score 41/56 Total Score:    Total Score:      PATIENT SURVEYS:  ABC scale: The Activities-Specific Balance Confidence (ABC) Scale 0% 10 20 30  40 50 60 70 80 90 100% No confidence<->completely confident  "How confident are you that you will not  lose your balance or become unsteady when you . . .   Date tested 03/19/2024   Walk around the house 100%  2. Walk up or down stairs 100%  3. Bend over and pick up a slipper from in front of a closet floor 100%  4. Reach for a small can off a shelf at eye level 100%  5. Stand on tip toes and reach for something above your head 50%  6. Stand on a chair and reach for something 20%  7. Sweep the floor 100%  8. Walk outside the house to a car parked in the driveway 100%  9. Get into or out of a car 100%  10. Walk across a parking lot to the mall 100%  11. Walk up or down a ramp 90%  12. Walk in a crowded mall where people rapidly walk past you 100%  13. Are bumped into by people as you walk through the mall 100%  14. Step onto or off of an escalator while you are holding onto the railing 65%  15. Step onto or off an escalator while holding onto parcels such that you cannot hold onto the railing 50%  16. Walk outside on icy sidewalks 20%  Total: #/16  80.94%  TREATMENT DATE: 03/19/2024  Performed , total 822ft, avg speed 0.71m/s  heel/toe raises, 2x15, B UE support on bar standing hip flexion 2x20, B UE support on bar mini squats at bar 2x10, cue for proper form and posterior hip hinge  Standing taps onto step, B UE on bar x20, L UE hand on bar x20 Pt showed minor LoB and difficulty when attempting activity using no UE support step ups onto 4in step, B UE support on bar, reports RPE 6-7/20 on scale following Blue foam pad static stance  EO 2x30s Seated hip abduction w/ RTB 2x10, 5s hold   PATIENT EDUCATION: Education details: Educated in international aid/development worker of HEP program, timing of therapy session, goals and impairments Person educated: Patient and Caregiver   Education method: Explanation, Facilities Manager, and Handouts Education comprehension: verbalized  understanding and returned demonstration  HOME EXERCISE PROGRAM: Access Code: [email or web address] URL: [email or web address] Date: 03/11/2024 Prepared by: Massie Dollar  Exercises - Standing March with Counter Support  - 1 x daily - 7 x weekly - 3 sets - 10 reps - 3 second hold - Heel Toe Raises with Counter Support  - 1 x daily - 7 x weekly - 3 sets - 10 reps - 3 second hold  GOALS: Goals reviewed with patient? No   SHORT TERM GOALS: Target date: 04/08/2024  Patient will be independent in home exercise program to improve strength/mobility for better functional independence with ADLs. Baseline: Goal status: INITIAL   LONG TERM GOALS: Target date: 06/03/2024   Patient will increase ABC scale score >90% to demonstrate better functional mobility and better confidence with ADLs.  Baseline: 80.9375% Goal status: INITIAL  2.  Patient (> 51 years old) will complete five times sit to stand test in < 11 seconds indicating an increased LE strength and improved balance. Baseline: 12.56s Goal status: INITIAL  3.  Patient will increase Berg Balance score by > 6 points to demonstrate decreased fall risk during functional activities Baseline: 41/56 Goal status: INITIAL  4.  Patient will increase 10 meter walk test to >1.52m/s as to improve gait speed for better community ambulation and to reduce fall risk. Baseline: 0.78 m/s Goal status: INITIAL  5.  Patient will reduce timed up and go to <13 seconds to reduce fall risk and demonstrate improved transfer/gait ability. Baseline: 16.105s Goal status: INITIAL  6. Patient will improve by 128ft in order to improve ambulation distance and increase endurance for community ambulation. Baseline: next session* Goal status: INITIAL  ASSESSMENT:  CLINICAL IMPRESSION: Began session with pt performance of , with pt performing well over the aged matched norm for his age category, with pt able to ambulate 851ft in 87m, reporting a 13/20  on RPE scale following activity. Pt showed minor SoB following , but did not appear to be fatigued or SoB throughout session, seen by short rest breaks and motivation to continue activities. Minor verbal cue given for proper form of bar squats, and pt showed good correction of form and increased posterior hip hinge. Pt had most difficulty with alternating steps on 4in step, unable to perform with no UE support, having difficulty with SLS and foot clearance of step. Activity with single UE support showed good stability and endurance. Min assist from SPT during static stance on foam, with pt showing moderate L and anterior postural sway during activity. Pt reported low difficulty with RTB seated abduction activity. Pt and caregiver educated on appointment duration and frequency, encouraging to schedule two appointments per week to se maximum  benefits. Pt would benefit with more SLS activities, dynamic gait, and dynamic balance activities.Pt will continue to benefit from skilled therapy to address remaining deficits in order to improve overall QoL and return to PLOF.    OBJECTIVE IMPAIRMENTS: Abnormal gait, decreased activity tolerance, decreased balance, decreased cognition, decreased endurance, decreased knowledge of use of DME, decreased mobility, difficulty walking, impaired UE functional use, improper body mechanics, and postural dysfunction.   ACTIVITY LIMITATIONS: carrying, lifting, stairs, reach over head, and hygiene/grooming  PARTICIPATION LIMITATIONS: medication management, community activity, and yard work  PERSONAL FACTORS: Age, Past/current experiences, Time since onset of injury/illness/exacerbation, and 3+ comorbidities: hx of cancer, mild dementia associated w/ alcoholism, chronic atrial fibrillation are also affecting patient's functional outcome.   REHAB POTENTIAL: Good  CLINICAL DECISION MAKING: Evolving/moderate complexity  EVALUATION COMPLEXITY: Moderate  PLAN:  PT  FREQUENCY: 2x/week  PT DURATION: 12 weeks  PLANNED INTERVENTIONS: 97164- PT Re-evaluation, 97750- Physical Performance Testing, 97110-Therapeutic exercises, 97530- Therapeutic activity, 97112- Neuromuscular re-education, 97535- Self Care, [id number]- Manual therapy, [phone]- Gait training, [phone]- Orthotic Initial, [phone]- Orthotic/Prosthetic subsequent, [phone]- Aquatic Therapy, [phone]- Electrical stimulation (unattended), [phone] (1-2 muscles), [id number] (3+ muscles)- Dry Needling, Patient/Family education, Balance training, Stair training, Taping, Joint mobilization, Joint manipulation, Spinal mobilization, Vestibular training, DME instructions, Wheelchair mobility training, Cryotherapy, and Moist heat  PLAN FOR NEXT SESSION:   -look at curb/stair navigation -dynamic balance and ambulation training -SLS activities -reemphasize HEP exercises and importance -work on proper use and mechanics of SPC, look at [employer], SPT 03/19/2024, 2:54 PM

## 2024-03-26 ENCOUNTER — Ambulatory Visit: Admitting: Physical Therapy

## 2024-03-26 ENCOUNTER — Encounter: Payer: Self-pay | Admitting: Physical Therapy

## 2024-03-26 DIAGNOSIS — Z9181 History of falling: Secondary | ICD-10-CM

## 2024-03-26 DIAGNOSIS — R296 Repeated falls: Secondary | ICD-10-CM

## 2024-03-26 DIAGNOSIS — R269 Unspecified abnormalities of gait and mobility: Secondary | ICD-10-CM

## 2024-03-26 DIAGNOSIS — R2681 Unsteadiness on feet: Secondary | ICD-10-CM

## 2024-03-26 DIAGNOSIS — M6281 Muscle weakness (generalized): Secondary | ICD-10-CM

## 2024-03-26 DIAGNOSIS — R293 Abnormal posture: Secondary | ICD-10-CM | POA: Diagnosis not present

## 2024-03-26 NOTE — Therapy (Unsigned)
 OUTPATIENT PHYSICAL THERAPY TREATMENT SESSION   Patient Name: Kenneth Luna. MRN: [id number] DOB:11-03-1932, 89 y.o., male Today's Date: 03/26/2024   PCP: Alla Amis, MD  REFERRING PROVIDER: Alla Amis, MD   END OF SESSION:  PT End of Session - 03/26/24 1110     Visit Number 3    Number of Visits 24    Date for Recertification  06/03/24    Progress Note Due on Visit 10    PT Start Time 1109    PT Stop Time 1147    PT Time Calculation (min) 38 min    Equipment Utilized During Treatment Gait belt    Activity Tolerance Patient tolerated treatment well;No increased pain    Behavior During Therapy [facility] for tasks assessed/performed          Past Medical History:  Diagnosis Date   Allergic rhinitis    Anemia    B12 deficiency 06/29/2015   Benign neoplasm of ascending colon    Benign neoplasm of descending colon    Colon polyp    Colostomy in place [facility])    DD (diverticular disease) 06/29/2015   Dementia (HCC)    memory issues   Fatty liver    GERD (gastroesophageal reflux disease)    Glaucoma    Hyperlipidemia    Hypertension    Rectal adenocarcinoma (HCC) 07/15/2015   Substance abuse (HCC)    alcohol   Traumatic intraparenchymal hemorrhage (HCC)    Past Surgical History:  Procedure Laterality Date   ABDOMINOPERINEAL PROCTOCOLECTOMY  12/17/2015   [facility]   CATARACT EXTRACTION W/ INTRAOCULAR LENS IMPLANT     COLONOSCOPY WITH PROPOFOL  N/A 07/10/2015   Procedure: COLONOSCOPY WITH PROPOFOL ;  Surgeon: Rogelia Copping, MD;  Location: [facility];  Service: Endoscopy;  Laterality: N/A;  PER CAREGIVER WAS TOLD THAT PT WOULD BE 1ST (KEEP PT EARLY AM)   COLONOSCOPY WITH PROPOFOL  N/A 04/24/2017   Procedure: COLONOSCOPY WITH PROPOFOL ;  Surgeon: Copping Rogelia, MD;  Location: [facility];  Service: Endoscopy;  Laterality: N/A;   INSERT / REPLACE / REMOVE PACEMAKER  01/08/2020   PACEMAKER INSERTION Left 01/07/2020   Procedure: INSERTION PACEMAKER;   Surgeon: Ammon Blunt, MD;  Location: [facility] ORS;  Service: Cardiovascular;  Laterality: Left;   PACEMAKER LEADLESS INSERTION N/A 01/07/2020   Procedure: PACEMAKER LEADLESS INSERTION;  Surgeon: Ammon Blunt, MD;  Location: [facility] INVASIVE CV LAB;  Service: Cardiovascular;  Laterality: N/A;   PERIPHERAL VASCULAR BALLOON ANGIOPLASTY N/A 01/07/2020   Procedure: PERIPHERAL VASCULAR BALLOON ANGIOPLASTY;  Surgeon: Marea Selinda RAMAN, MD;  Location: [facility] INVASIVE CV LAB;  Service: Cardiovascular;  Laterality: N/A;   PERIPHERAL VASCULAR BALLOON ANGIOPLASTY N/A 01/07/2020   Procedure: PERIPHERAL VASCULAR BALLOON ANGIOPLASTY;  Surgeon: Jama Cordella MATSU, MD;  Location: [facility] INVASIVE CV LAB;  Service: Cardiovascular;  Laterality: N/A;   PERIPHERAL VASCULAR THROMBECTOMY Right 01/20/2020   Procedure: PERIPHERAL VASCULAR THROMBECTOMY;  Surgeon: Marea Selinda RAMAN, MD;  Location: [facility] INVASIVE CV LAB;  Service: Cardiovascular;  Laterality: Right;   POLYPECTOMY  07/10/2015   Procedure: POLYPECTOMY;  Surgeon: Rogelia Copping, MD;  Location: [facility];  Service: Endoscopy;;   Patient Active Problem List   Diagnosis Date Noted   Chronic right shoulder pain 04/29/2020   Colostomy in place [facility]) 04/29/2020   Acute deep vein thrombosis (DVT) of femoral vein (HCC) 01/17/2020   DVT (deep venous thrombosis) (HCC) 01/14/2020   Sick sinus syndrome (HCC) 01/07/2020   Cardiac syncope 12/25/2019   Persistent atrial fibrillation (HCC) 11/15/2019  GERD without esophagitis 04/25/2018   Parastomal hernia without obstruction or gangrene 06/19/2017   Personal history of colon cancer    Benign neoplasm of ascending colon    Allergic rhinitis 01/30/2017   Encounter for general adult medical examination without abnormal findings 10/03/2016   Vaccine counseling 10/03/2016   Palmar plantar erythrodysesthesia 04/17/2016   Iron deficiency anemia 03/07/2016   Complete tear of right rotator cuff 11/16/2015   Rotator cuff  tendinitis, right 11/16/2015   Hyponatremia 10/09/2015   Rash of face 10/09/2015   Intracranial hemorrhage (HCC) 08/18/2015   Low back pain 08/18/2015   T12 compression fracture (HCC) 08/10/2015   Acute diverticulitis 08/08/2015   Generalized weakness 08/08/2015   Alcohol dependence (HCC) 08/08/2015   Diverticulitis 08/08/2015   Intraparenchymal hemorrhage of brain (HCC) 07/24/2015   Rectal adenocarcinoma (HCC) 07/15/2015   B12 deficiency 06/29/2015   Dementia (HCC) 06/29/2015   DD (diverticular disease) 06/29/2015   Essential (primary) hypertension 06/29/2015   GI bleed 06/23/2015    ONSET DATE: over the past couple years   REFERRING DIAG:  Z91.81 (ICD-10-CM) - At high risk for falls    THERAPY DIAG:  Abnormal posture  Repeated falls  History of falling  Unsteadiness on feet  Muscle weakness (generalized)  Abnormality of gait  Rationale for Evaluation and Treatment: Rehabilitation  SUBJECTIVE:                                                                                                                                                                                             SUBJECTIVE STATEMENT: Pt states he is feeling good today and is trying to stay warm. Caregiver reports this weekend he was able to get out of the car easier. States they have grocery shopping to do later. No new falls to report.   Pt accompanied by: caregiver, answers most questions   PERTINENT HISTORY:  From recent routine medica visit on 03/05/2024: Kenneth Luna. is a 89 y.o. here for preventative health exam and subsequent medicare wellness  Preventative health exam: Still has moments of weakness and instability and would like to follow-up with physical therapy. States that physical therapy never contacted them. Chronic medical issues stable and tolerating medications without adverse effects. Still receiving 24-hour care and supervision. Still cutting down alcohol to 1 bottle  of wine per week and doing well with this. Exercises regularly and tries to adhere to healthy diet. No exertional cp or syncopal episodes. No urinary issues or rectal pain/bleeding. Denies any tobacco use.  ROS Review of systems is unremarkable for any active cardiac, respiratory, GI, GU, hematologic, neurologic, dermatologic, HEENT, or  psychiatric symptoms except as noted above. No fevers, chills, or constitutional symptoms.  PAIN:  Are you having pain? No  PRECAUTIONS: None  RED FLAGS: None   WEIGHT BEARING RESTRICTIONS: No  FALLS: Has patient fallen in last 6 months? Yes. Number of falls 2 and tripped over the carpet to floor transition  LIVING ENVIRONMENT: Lives with: lives in an assisted living facility, has 24/7 care available Lives in: House/apartment Stairs: No Has following equipment at home: Single point cane and Walker - 2 wheeled  PLOF: Independent  PATIENT GOALS: Getting stronger and preventing falls.  OBJECTIVE:  Note: Objective measures were completed at Evaluation unless otherwise noted.  COGNITION: Overall cognitive status: History of cognitive impairments - at baseline and mild dementia, caregiver answers most questions.   SENSATION: WFL Light touch: WFL B LE  COORDINATION: RAMPS: WFL B H<>S: WFL B  POSTURE: rounded shoulders, forward head, increased thoracic kyphosis, and flexed trunk   LOWER EXTREMITY MMT:    MMT Right Eval Left Eval  Hip flexion 4+ 4+  Hip extension    Hip abduction 5 5  Hip adduction 5 5  Hip internal rotation    Hip external rotation    Knee flexion 4+ 4+  Knee extension 5 5  Ankle dorsiflexion 4+ 4+  Ankle plantarflexion    Ankle inversion    Ankle eversion    (Blank rows = not tested)   TRANSFERS: Sit to stand: Complete Independence  Assistive device utilized: Single point cane     Stand to sit: Complete Independence  Assistive device utilized: Single point cane      STAIRS: Not tested GAIT: Findings: Gait  Characteristics: decreased step length- Right, decreased step length- Left, decreased stance time- Right, decreased stance time- Left, decreased stride length, decreased trunk rotation, trunk flexed, and wide BOS, Distance walked: distance into and out of gym, distance of functional outcome measures performed this session, Assistive device utilized:Single point cane, Level of assistance: CGA, and Comments: ambulates with SPC improperly  FUNCTIONAL TESTS:  -Pt performed 5 time sit<>stand (5xSTS): 12.56 sec (>15 sec indicates increased fall risk)   -PT instructed pt in TUG: 16.105 sec (average of 3 trials; >13.5 sec indicates increased fall risk)  (trial 1: 15.96s, trial 2: 16.25s)  -6 minute walk test: 6 Min Walk Test:  Instructed patient to ambulate as quickly and as safely as possible for 6 minutes using LRAD. Patient was allowed to take standing rest breaks without stopping the test, but if the patient required a sitting rest break the clock would be stopped and the test would be over.  Results: 825 feet (251.46 meters, Avg speed 0.43m/s) using a SPC with CGA. Results indicate that the patient has reduced endurance with ambulation compared to age matched norms.  Age Matched Norms: 40-69 yo M: 50 F: 51, 79-79 yo M: 71 F: 471, 18-89 yo M: 417 F: 392 MDC: 58.21 meters (190.98 feet) or 50 meters (ANPTA Core Set of Outcome Measures for Adults with Neurologic Conditions, 2018)   -10 Meter Walk Test: Patient instructed to walk 10 meters (32.8 ft) as quickly and as safely as possible at their normal speed x2 and at a fast speed x2. Time measured from 2 meter mark to 8 meter mark to accommodate ramp-up and ramp-down.  Normal speed 1: 0.79 m/s, 12.63s (limited community ambulator) Normal speed 2: 0.77 m/s, 12.98s (limited community ambulator)  Cut off scores: <0.4 m/s = household Ambulator, 0.4-0.8 m/s = limited community Ambulator, >0.8 m/s =  community Ambulator, >1.2 m/s = crossing a street, <1.0 =  increased fall risk MCID 0.05 m/s (small), 0.13 m/s (moderate), 0.06 m/s (significant)  (ANPTA Core Set of Outcome Measures for Adults with Neurologic Conditions, 2018)   -Berg Balance Scale: Patient demonstrates increased fall risk as noted by score of 41/56 on Berg Balance Scale.  (<36= high risk for falls, close to 100%; 37-45 significant >80%; 46-51 moderate >50%; 52-55 lower >25%)  Item Test date: 03/26/2024  Date:  Date:   Sitting to standing 4. able to stand without using hands and stabilize independently Insert SmartPhrase OPRCBERGREEVAL Insert SmartPhrase OPRCBERGREEVAL  2. Standing unsupported 3. able to stand 2 minutes with supervision    3. Sitting with back unsupported, feet supported 4. able to sit safely and securely for 2 minutes    4. Standing to sitting 4. sits safely with minimal use of hands    5. Pivot transfer  4. able to transfer safely with minor use of hands    6. Standing unsupported with eyes closed 3. able to stand 10 seconds with supervision    7. Standing unsupported with feet together 3. able to place feet together independently and stand 1 minute with supervision    8. Reaching forward with outstretched arms while standing 3. can reach forward 12 cm (5 inches)    9. Pick up object from the floor from standing 4. able to pick up slipper safely and easily    10. Turning to look behind over left and right shoulders while standing 2. turns sideways but only maintains balance    11. Turn 360 degrees 2. able to turn 360 degrees safely but slowly    12. Place alternate foot on step or stool while standing unsupported 1. able to complete > 2 steps needs minimal assist    13. Standing unsupported one foot in front 3. able to place foot ahead independently and hold 30 seconds    14. Standing on one leg 1. tries to lift leg unable to hold 3 seconds but remains standing independently.      Total Score 41/56 Total Score:    Total Score:      PATIENT SURVEYS:  ABC scale: The  Activities-Specific Balance Confidence (ABC) Scale 0% 10 20 30  40 50 60 70 80 90 100% No confidence<->completely confident  "How confident are you that you will not lose your balance or become unsteady when you . . .   Date tested 03/26/2024   Walk around the house 100%  2. Walk up or down stairs 100%  3. Bend over and pick up a slipper from in front of a closet floor 100%  4. Reach for a small can off a shelf at eye level 100%  5. Stand on tip toes and reach for something above your head 50%  6. Stand on a chair and reach for something 20%  7. Sweep the floor 100%  8. Walk outside the house to a car parked in the driveway 100%  9. Get into or out of a car 100%  10. Walk across a parking lot to the mall 100%  11. Walk up or down a ramp 90%  12. Walk in a crowded mall where people rapidly walk past you 100%  13. Are bumped into by people as you walk through the mall 100%  14. Step onto or off of an escalator while you are holding onto the railing 65%  15. Step onto or off an escalator while holding  onto parcels such that you cannot hold onto the railing 50%  16. Walk outside on icy sidewalks 20%  Total: #/16  80.94%                                                                                                                                 TREATMENT DATE: 03/26/2024  Alternating foot taps onto 4in step, x20, one hand support on bar Alternating foot taps onto 4in step x20, 2# AW on B LE, one hand support on bar step ups onto 4in step, B UE support on bar, 2# AW on B LE, 2x10 Split stance balance on 4in step, back foot on solid ground, 2x30s each side, CGA-min assist from SPT Blue foam pad static stance EC, 2x30s, min-mod assist from SPT Static stance on blue foam pad with crossbody reach to tap on hedgehog, 2x30 reps, CGA from SPT 67ft ambulation in hallway with pivot turns w/ SPC, cue for <4 steps of turns, 2 laps (*1 lap= 116ft), supervision/CGA from SPT 27ft ambulation in hallway  with L/R horizontal head turns, 2 laps w/ SPC, CGA from SPT (*1 lap= 122ft) 57ft sideways ambulation in hallway, 2 laps L/R, intermittent use of SPC, CGA from SPT (*1 lap= 44ft) Vitals end of session: O2 95, HR 78  PATIENT EDUCATION: Education details: Educated in Insurance Account Manager program, timing of therapy session, goals and impairments Person educated: Patient and Caregiver   Education method: Explanation, Facilities Manager, and Handouts Education comprehension: verbalized understanding and returned demonstration  HOME EXERCISE PROGRAM: Access Code: [email or web address] URL: [email or web address] Date: 03/11/2024 Prepared by: Massie Dollar  Exercises - Standing March with Counter Support  - 1 x daily - 7 x weekly - 3 sets - 10 reps - 3 second hold - Heel Toe Raises with Counter Support  - 1 x daily - 7 x weekly - 3 sets - 10 reps - 3 second hold  GOALS: Goals reviewed with patient? No   SHORT TERM GOALS: Target date: 04/08/2024  Patient will be independent in home exercise program to improve strength/mobility for better functional independence with ADLs. Baseline: Goal status: INITIAL   LONG TERM GOALS: Target date: 06/03/2024   Patient will increase ABC scale score >90% to demonstrate better functional mobility and better confidence with ADLs.  Baseline: 80.9375% Goal status: INITIAL  2.  Patient (> 85 years old) will complete five times sit to stand test in < 11 seconds indicating an increased LE strength and improved balance. Baseline: 12.56s Goal status: INITIAL  3.  Patient will increase Berg Balance score by > 6 points to demonstrate decreased fall risk during functional activities Baseline: 41/56 Goal status: INITIAL  4.  Patient will increase 10 meter walk test to >1.10m/s as to improve gait speed for better community ambulation and to reduce fall risk. Baseline: 0.78 m/s Goal status: INITIAL  5.  Patient will reduce timed up and go to <13 seconds to reduce  fall risk and demonstrate improved transfer/gait ability. Baseline: 16.105s Goal status: INITIAL  6. Patient will improve by 146ft in order to improve ambulation distance and increase endurance for community ambulation. Baseline: next session* Goal status: INITIAL  ASSESSMENT:  CLINICAL IMPRESSION: Pt arrives to session motivated for activity. Pt continues to show good LE strength and endurance during exercises, increasing weight of LE today using 2# AW for stepping activities, which pt tolerated well. Continues to have more challenge with eyes closed balance and single leg stance activities. Pt responded well to verbal cues for upright posture during ambulation and decreased number of steps during pivot turns. Mod assist needed today during eyes closed NBOS balance on blue foam pad due to L lateral lean. Pt showed good endurance ending today's session, able to complete all laps of ambulation with no rest breaks or SoB. Pt would continue to benefit from increased challenge of SLS activities, dynamic gait, and dynamic balance activities. Caregiver was educated ending session today on the benefits and purpose of activities in session and PT overall. Pt will continue to benefit from skilled therapy to address remaining deficits in order to improve overall QoL and return to PLOF.   OBJECTIVE IMPAIRMENTS: Abnormal gait, decreased activity tolerance, decreased balance, decreased cognition, decreased endurance, decreased knowledge of use of DME, decreased mobility, difficulty walking, impaired UE functional use, improper body mechanics, and postural dysfunction.   ACTIVITY LIMITATIONS: carrying, lifting, stairs, reach over head, and hygiene/grooming  PARTICIPATION LIMITATIONS: medication management, community activity, and yard work  PERSONAL FACTORS: Age, Past/current experiences, Time since onset of injury/illness/exacerbation, and 3+ comorbidities: hx of cancer, mild dementia associated w/  alcoholism, chronic atrial fibrillation are also affecting patient's functional outcome.   REHAB POTENTIAL: Good  CLINICAL DECISION MAKING: Evolving/moderate complexity  EVALUATION COMPLEXITY: Moderate  PLAN:  PT FREQUENCY: 2x/week  PT DURATION: 12 weeks  PLANNED INTERVENTIONS: 97164- PT Re-evaluation, 97750- Physical Performance Testing, 97110-Therapeutic exercises, 97530- Therapeutic activity, 97112- Neuromuscular re-education, 97535- Self Care, [id number]- Manual therapy, [phone]- Gait training, [phone]- Orthotic Initial, [phone]- Orthotic/Prosthetic subsequent, [phone]- Aquatic Therapy, [phone]- Electrical stimulation (unattended), [phone] (1-2 muscles), [phone] (3+ muscles)- Dry Needling, Patient/Family education, Balance training, Stair training, Taping, Joint mobilization, Joint manipulation, Spinal mobilization, Vestibular training, DME instructions, Wheelchair mobility training, Cryotherapy, and Moist heat  PLAN FOR NEXT SESSION:   -look at curb/stair navigation -dynamic balance and ambulation training -SLS activities -reemphasize HEP exercises and importance -work on proper use and mechanics of SPC  [employer], SPT 03/26/2024, 12:13 PM

## 2024-03-27 ENCOUNTER — Ambulatory Visit: Admitting: Physical Therapy

## 2024-03-28 ENCOUNTER — Ambulatory Visit: Admitting: Physical Therapy

## 2024-03-28 ENCOUNTER — Encounter: Payer: Self-pay | Admitting: Physical Therapy

## 2024-03-28 DIAGNOSIS — R293 Abnormal posture: Secondary | ICD-10-CM | POA: Diagnosis not present

## 2024-03-28 DIAGNOSIS — Z9181 History of falling: Secondary | ICD-10-CM

## 2024-03-28 DIAGNOSIS — M6281 Muscle weakness (generalized): Secondary | ICD-10-CM

## 2024-03-28 DIAGNOSIS — R2681 Unsteadiness on feet: Secondary | ICD-10-CM

## 2024-03-28 DIAGNOSIS — R296 Repeated falls: Secondary | ICD-10-CM

## 2024-03-28 DIAGNOSIS — R269 Unspecified abnormalities of gait and mobility: Secondary | ICD-10-CM

## 2024-03-28 NOTE — Therapy (Signed)
 OUTPATIENT PHYSICAL THERAPY TREATMENT SESSION   Patient Name: Kenneth Luna. MRN: [id number] DOB:08/19/1932, 89 y.o., male Today's Date: 03/28/2024   PCP: Kenneth Amis, MD  REFERRING PROVIDER: Alla Amis, MD   END OF SESSION:  PT End of Session - 03/28/24 1021     Visit Number 4    Number of Visits 24    Date for Recertification  06/03/24    Progress Note Due on Visit 10    PT Start Time 1020    PT Stop Time 1100    PT Time Calculation (min) 40 min    Equipment Utilized During Treatment Gait belt    Activity Tolerance Patient tolerated treatment well;No increased pain    Behavior During Therapy [facility] for tasks assessed/performed          Past Medical History:  Diagnosis Date   Allergic rhinitis    Anemia    B12 deficiency 06/29/2015   Benign neoplasm of ascending colon    Benign neoplasm of descending colon    Colon polyp    Colostomy in place [facility])    DD (diverticular disease) 06/29/2015   Dementia (HCC)    memory issues   Fatty liver    GERD (gastroesophageal reflux disease)    Glaucoma    Hyperlipidemia    Hypertension    Rectal adenocarcinoma (HCC) 07/15/2015   Substance abuse (HCC)    alcohol   Traumatic intraparenchymal hemorrhage (HCC)    Past Surgical History:  Procedure Laterality Date   ABDOMINOPERINEAL PROCTOCOLECTOMY  12/17/2015   [facility]   CATARACT EXTRACTION W/ INTRAOCULAR LENS IMPLANT     COLONOSCOPY WITH PROPOFOL  N/A 07/10/2015   Procedure: COLONOSCOPY WITH PROPOFOL ;  Surgeon: Kenneth Copping, MD;  Location: [facility];  Service: Endoscopy;  Laterality: N/A;  PER CAREGIVER WAS TOLD THAT PT WOULD BE 1ST (KEEP PT EARLY AM)   COLONOSCOPY WITH PROPOFOL  N/A 04/24/2017   Procedure: COLONOSCOPY WITH PROPOFOL ;  Surgeon: Luna Rogelia, MD;  Location: [facility];  Service: Endoscopy;  Laterality: N/A;   INSERT / REPLACE / REMOVE PACEMAKER  01/08/2020   PACEMAKER INSERTION Left 01/07/2020   Procedure: INSERTION PACEMAKER;   Surgeon: Kenneth Blunt, MD;  Location: [facility] ORS;  Service: Cardiovascular;  Laterality: Left;   PACEMAKER LEADLESS INSERTION N/A 01/07/2020   Procedure: PACEMAKER LEADLESS INSERTION;  Surgeon: Kenneth Blunt, MD;  Location: [facility] INVASIVE CV LAB;  Service: Cardiovascular;  Laterality: N/A;   PERIPHERAL VASCULAR BALLOON ANGIOPLASTY N/A 01/07/2020   Procedure: PERIPHERAL VASCULAR BALLOON ANGIOPLASTY;  Surgeon: Kenneth Selinda RAMAN, MD;  Location: [facility];  Service: Cardiovascular;  Laterality: N/A;   PERIPHERAL VASCULAR BALLOON ANGIOPLASTY N/A 01/07/2020   Procedure: PERIPHERAL VASCULAR BALLOON ANGIOPLASTY;  Surgeon: Kenneth Cordella MATSU, MD;  Location: [facility] INV[facility] CV LAB;  Service: Cardiovascular;  Laterality: N/A;   PERIPHERAL VASCULAR THROMBECTOMY Right 01/20/2020   Procedure: PERIPHERAL VASCULAR THROMBECTOMY;  Surgeon: Kenneth Selinda RAMAN, MD;  Location: [facility] INV[facility] CV LAB;  Service: Cardiovascular;  Laterality: Right;   POLYPECTOMY  07/10/2015   Procedure: POLYPECTOMY;  Surgeon: Kenneth Copping, MD;  Location: [facility];  Service: Endoscopy;;   Patient Active Problem List   Diagnosis Date Noted   Chronic right shoulder pain 04/29/2020   Colostomy in place [facility]) 04/29/2020   Acute deep vein thrombosis (DVT) of femoral vein (HCC) 01/17/2020   DVT (deep venous thrombosis) (HCC) 01/14/2020   Sick sinus syndrome (HCC) 01/07/2020   Cardiac syncope 12/25/2019   Persistent atrial fibrillation (HCC) 11/15/2019  GERD without esophagitis 04/25/2018   Parastomal hernia without obstruction or gangrene 06/19/2017   Personal history of colon cancer    Benign neoplasm of ascending colon    Allergic rhinitis 01/30/2017   Encounter for general adult medical examination without abnormal findings 10/03/2016   Vaccine counseling 10/03/2016   Palmar plantar erythrodysesthesia 04/17/2016   Iron deficiency anemia 03/07/2016   Complete tear of right rotator cuff 11/16/2015   Rotator cuff  tendinitis, right 11/16/2015   Hyponatremia 10/09/2015   Rash of face 10/09/2015   Intracranial hemorrhage (HCC) 08/18/2015   Low back pain 08/18/2015   T12 compression fracture (HCC) 08/10/2015   Acute diverticulitis 08/08/2015   Generalized weakness 08/08/2015   Alcohol dependence (HCC) 08/08/2015   Diverticulitis 08/08/2015   Intraparenchymal hemorrhage of brain (HCC) 07/24/2015   Rectal adenocarcinoma (HCC) 07/15/2015   B12 deficiency 06/29/2015   Dementia (HCC) 06/29/2015   DD (diverticular disease) 06/29/2015   Essential (primary) hypertension 06/29/2015   GI bleed 06/23/2015    ONSET DATE: over the past couple years   REFERRING DIAG:  Z91.81 (ICD-10-CM) - At high risk for falls    THERAPY DIAG:  Abnormal posture  History of falling  Unsteadiness on feet  Muscle weakness (generalized)  Repeated falls  Abnormality of gait  Rationale for Evaluation and Treatment: Rehabilitation  SUBJECTIVE:                                                                                                                                                                                             SUBJECTIVE STATEMENT:  Says he may go grocery shopping later today. Says he is feeling good otherwise.  Pt accompanied by: caregiver, answers most questions   PERTINENT HISTORY:  From recent routine medica visit on 03/05/2024: Kenneth Hue. is a 89 y.o. here for preventative health exam and subsequent medicare wellness  Preventative health exam: Still has moments of weakness and instability and would like to follow-up with physical therapy. States that physical therapy never contacted them. Chronic medical issues stable and tolerating medications without adverse effects. Still receiving 24-hour care and supervision. Still cutting down alcohol to 1 bottle of wine per week and doing well with this. Exercises regularly and tries to adhere to healthy diet. No exertional cp or  syncopal episodes. No urinary issues or rectal pain/bleeding. Denies any tobacco use.  ROS Review of systems is unremarkable for any active cardiac, respiratory, GI, GU, hematologic, neurologic, dermatologic, HEENT, or psychiatric symptoms except as noted above. No fevers, chills, or constitutional symptoms.  PAIN:  Are you having pain? No  PRECAUTIONS: None  RED FLAGS:  None   WEIGHT BEARING RESTRICTIONS: No  FALLS: Has patient fallen in last 6 months? Yes. Number of falls 2 and tripped over the carpet to floor transition  LIVING ENVIRONMENT: Lives with: lives in an assisted living facility, has 24/7 care available Lives in: House/apartment Stairs: No Has following equipment at home: Single point cane and Walker - 2 wheeled  PLOF: Independent  PATIENT GOALS: Getting stronger and preventing falls.  OBJECTIVE:  Note: Objective measures were completed at Evaluation unless otherwise noted.  COGNITION: Overall cognitive status: History of cognitive impairments - at baseline and mild dementia, caregiver answers most questions.   SENSATION: WFL Light touch: WFL B LE  COORDINATION: RAMPS: WFL B H<>S: WFL B  POSTURE: rounded shoulders, forward head, increased thoracic kyphosis, and flexed trunk   LOWER EXTREMITY MMT:    MMT Right Eval Left Eval  Hip flexion 4+ 4+  Hip extension    Hip abduction 5 5  Hip adduction 5 5  Hip internal rotation    Hip external rotation    Knee flexion 4+ 4+  Knee extension 5 5  Ankle dorsiflexion 4+ 4+  Ankle plantarflexion    Ankle inversion    Ankle eversion    (Blank rows = not tested)   TRANSFERS: Sit to stand: Complete Independence  Assistive device utilized: Single point cane     Stand to sit: Complete Independence  Assistive device utilized: Single point cane      STAIRS: Not tested GAIT: Findings: Gait Characteristics: decreased step length- Right, decreased step length- Left, decreased stance time- Right, decreased  stance time- Left, decreased stride length, decreased trunk rotation, trunk flexed, and wide BOS, Distance walked: distance into and out of gym, distance of functional outcome measures performed this session, Assistive device utilized:Single point cane, Level of assistance: CGA, and Comments: ambulates with SPC improperly  FUNCTIONAL TESTS:  -Pt performed 5 time sit<>stand (5xSTS): 12.56 sec (>15 sec indicates increased fall risk)   -PT instructed pt in TUG: 16.105 sec (average of 3 trials; >13.5 sec indicates increased fall risk)  (trial 1: 15.96s, trial 2: 16.25s)  -6 minute walk test: 6 Min Walk Test:  Instructed patient to ambulate as quickly and as safely as possible for 6 minutes using LRAD. Patient was allowed to take standing rest breaks without stopping the test, but if the patient required a sitting rest break the clock would be stopped and the test would be over.  Results: 825 feet (251.46 meters, Avg speed 0.29m/s) using a SPC with CGA. Results indicate that the patient has reduced endurance with ambulation compared to age matched norms.  Age Matched Norms: 34-69 yo M: 28 F: 72, 86-79 yo M: 34 F: 471, 22-89 yo M: 417 F: 392 MDC: 58.21 meters (190.98 feet) or 50 meters (ANPTA Core Set of Outcome Measures for Adults with Neurologic Conditions, 2018)   -10 Meter Walk Test: Patient instructed to walk 10 meters (32.8 ft) as quickly and as safely as possible at their normal speed x2 and at a fast speed x2. Time measured from 2 meter mark to 8 meter mark to accommodate ramp-up and ramp-down.  Normal speed 1: 0.79 m/s, 12.63s (limited community ambulator) Normal speed 2: 0.77 m/s, 12.98s (limited community ambulator)  Cut off scores: <0.4 m/s = household Ambulator, 0.4-0.8 m/s = limited community Ambulator, >0.8 m/s = community Ambulator, >1.2 m/s = crossing a street, <1.0 = increased fall risk MCID 0.05 m/s (small), 0.13 m/s (moderate), 0.06 m/s (significant)  (ANPTA Core  Set of Outcome  Measures for Adults with Neurologic Conditions, 2018)   -Berg Balance Scale: Patient demonstrates increased fall risk as noted by score of 41/56 on Berg Balance Scale.  (<36= high risk for falls, close to 100%; 37-45 significant >80%; 46-51 moderate >50%; 52-55 lower >25%)  Item Test date: 03/28/2024  Date:  Date:   Sitting to standing 4. able to stand without using hands and stabilize independently Insert SmartPhrase OPRCBERGREEVAL Insert SmartPhrase OPRCBERGREEVAL  2. Standing unsupported 3. able to stand 2 minutes with supervision    3. Sitting with back unsupported, feet supported 4. able to sit safely and securely for 2 minutes    4. Standing to sitting 4. sits safely with minimal use of hands    5. Pivot transfer  4. able to transfer safely with minor use of hands    6. Standing unsupported with eyes closed 3. able to stand 10 seconds with supervision    7. Standing unsupported with feet together 3. able to place feet together independently and stand 1 minute with supervision    8. Reaching forward with outstretched arms while standing 3. can reach forward 12 cm (5 inches)    9. Pick up object from the floor from standing 4. able to pick up slipper safely and easily    10. Turning to look behind over left and right shoulders while standing 2. turns sideways but only maintains balance    11. Turn 360 degrees 2. able to turn 360 degrees safely but slowly    12. Place alternate foot on step or stool while standing unsupported 1. able to complete > 2 steps needs minimal assist    13. Standing unsupported one foot in front 3. able to place foot ahead independently and hold 30 seconds    14. Standing on one leg 1. tries to lift leg unable to hold 3 seconds but remains standing independently.      Total Score 41/56 Total Score:    Total Score:      PATIENT SURVEYS:  ABC scale: The Activities-Specific Balance Confidence (ABC) Scale 0% 10 20 30  40 50 60 70 80 90 100% No confidence<->completely  confident  How confident are you that you will not lose your balance or become unsteady when you . . .   Date tested 03/28/2024   Walk around the house 100%  2. Walk up or down stairs 100%  3. Bend over and pick up a slipper from in front of a closet floor 100%  4. Reach for a small can off a shelf at eye level 100%  5. Stand on tip toes and reach for something above your head 50%  6. Stand on a chair and reach for something 20%  7. Sweep the floor 100%  8. Walk outside the house to a car parked in the driveway 100%  9. Get into or out of a car 100%  10. Walk across a parking lot to the mall 100%  11. Walk up or down a ramp 90%  12. Walk in a crowded mall where people rapidly walk past you 100%  13. Are bumped into by people as you walk through the mall 100%  14. Step onto or off of an escalator while you are holding onto the railing 65%  15. Step onto or off an escalator while holding onto parcels such that you cannot hold onto the railing 50%  16. Walk outside on icy sidewalks 20%  Total: #/16  80.94%  TREATMENT DATE: 03/28/2024  Lateral step ups onto foam pad, B UE support on bar, 3# AW on B LE, 2x10 each side Split stance balance on 4in step, back foot on solid ground; 1x30s each side eyes open, 1x30s each side eyes closed  CGA-min assist from SPT Blue foam pad static stance EC, 2x30s, min assist from SPT Blue foam pad static stance EO, 2x30s, CGA from SPT 5 laps up and down stairs w/ 3# AW on B LE, B UE support on rails, reciprocal gait on ascent and descent, CGA from SPT Ball catch and toss standing on foam pad, , CGA-min assist from SPT Static stance on foam pad with crossbody pick up of hedgehog and placement to opposite side, 2x20 each side, CGA-min assisst from SPT High knee marching in // bars, B UE support, 4 laps, supervision assist from  SPT Backwards walking in // bars, 4 laps, CGA from SPT Sideways walking over 3 canes placed on floor in // bars, 4 laps back/forth  PATIENT EDUCATION: Education details: Educated in performance of HEP program, timing of therapy session, goals and impairments Person educated: Patient and Caregiver   Education method: Explanation, Demonstration, and Handouts Education comprehension: verbalized understanding and returned demonstration  HOME EXERCISE PROGRAM: Access Code: [email or web address] URL: [email or web address] Date: 03/11/2024 Prepared by: Massie Dollar  Exercises - Standing March with Counter Support  - 1 x daily - 7 x weekly - 3 sets - 10 reps - 3 second hold - Heel Toe Raises with Counter Support  - 1 x daily - 7 x weekly - 3 sets - 10 reps - 3 second hold  GOALS: Goals reviewed with patient? No   SHORT TERM GOALS: Target date: 04/08/2024  Patient will be independent in home exercise program to improve strength/mobility for better functional independence with ADLs. Baseline: Goal status: INITIAL   LONG TERM GOALS: Target date: 06/03/2024   Patient will increase ABC scale score >90% to demonstrate better functional mobility and better confidence with ADLs.  Baseline: 80.9375% Goal status: INITIAL  2.  Patient (> 51 years old) will complete five times sit to stand test in < 11 seconds indicating an increased LE strength and improved balance. Baseline: 12.56s Goal status: INITIAL  3.  Patient will increase Berg Balance score by > 6 points to demonstrate decreased fall risk during functional activities Baseline: 41/56 Goal status: INITIAL  4.  Patient will increase 10 meter walk test to >1.85m/s as to improve gait speed for better community ambulation and to reduce fall risk. Baseline: 0.78 m/s Goal status: INITIAL  5.  Patient will reduce timed up and go to <13 seconds to reduce fall risk and demonstrate improved transfer/gait ability. Baseline: 16.105s Goal  status: INITIAL  6. Patient will improve by 158ft in order to improve ambulation distance and increase endurance for community ambulation. Baseline: next session* Goal status: INITIAL  ASSESSMENT:  CLINICAL IMPRESSION: Pt arrived to session with great motivation for activity. Pt continued to show good stamina during session, not needing many rest breaks and being excited to perform next activity. Continued with blue foam pad dynamic balance training, with pt needing less assistance and showing less postural sway w/ EC activities today. Increased AW to 3# today, which pt tolerated well in all activities. Pt was able to perform stairs ascent/descent with 3# AW with B UE support and reciprocal gait pattern both ways. Pt showed minor difficulty during foam stance ball catch toss, missing the ball catch a few times. Cuing needed  for increased side step distance during cane side-stepping over canes activity. Pt will continue to benefit from skilled therapy to address remaining deficits in order to improve overall QoL and return to PLOF.   OBJECTIVE IMPAIRMENTS: Abnormal gait, decreased activity tolerance, decreased balance, decreased cognition, decreased endurance, decreased knowledge of use of DME, decreased mobility, difficulty walking, impaired UE functional use, improper body mechanics, and postural dysfunction.   ACTIVITY LIMITATIONS: carrying, lifting, stairs, reach over head, and hygiene/grooming  PARTICIPATION LIMITATIONS: medication management, community activity, and yard work  PERSONAL FACTORS: Age, Past/current experiences, Time since onset of injury/illness/exacerbation, and 3+ comorbidities: hx of cancer, mild dementia associated w/ alcoholism, chronic atrial fibrillation are also affecting patient's functional outcome.   REHAB POTENTIAL: Good  CLINICAL DECISION MAKING: Evolving/moderate complexity  EVALUATION COMPLEXITY: Moderate  PLAN:  PT FREQUENCY: 2x/week  PT DURATION:  12 weeks  PLANNED INTERVENTIONS: 97164- PT Re-evaluation, 97750- Physical Performance Testing, 97110-Therapeutic exercises, 97530- Therapeutic activity, 97112- Neuromuscular re-education, 97535- Self Care, [id number]- Manual therapy, [phone]- Gait training, [phone]- Orthotic Initial, [phone]- Orthotic/Prosthetic subsequent, [phone]- Aquatic Therapy, [phone]- Electrical stimulation (unattended), [phone] (1-2 muscles), [phone] (3+ muscles)- Dry Needling, Patient/Family education, Balance training, Stair training, Taping, Joint mobilization, Joint manipulation, Spinal mobilization, Vestibular training, DME instructions, Wheelchair mobility training, Cryotherapy, and Moist heat  PLAN FOR NEXT SESSION:  -dynamic balance and ambulation training -SLS activities -reemphasize HEP exercises and importance -work on proper use and mechanics of SPC  [facility], SPT 03/28/2024, 10:22 AM

## 2024-04-01 ENCOUNTER — Encounter: Payer: Self-pay | Admitting: Physical Therapy

## 2024-04-01 ENCOUNTER — Ambulatory Visit: Admitting: Physical Therapy

## 2024-04-01 DIAGNOSIS — R2681 Unsteadiness on feet: Secondary | ICD-10-CM

## 2024-04-01 DIAGNOSIS — R269 Unspecified abnormalities of gait and mobility: Secondary | ICD-10-CM

## 2024-04-01 DIAGNOSIS — Z9181 History of falling: Secondary | ICD-10-CM

## 2024-04-01 DIAGNOSIS — M6281 Muscle weakness (generalized): Secondary | ICD-10-CM

## 2024-04-01 DIAGNOSIS — R296 Repeated falls: Secondary | ICD-10-CM

## 2024-04-01 DIAGNOSIS — R293 Abnormal posture: Secondary | ICD-10-CM

## 2024-04-01 NOTE — Therapy (Unsigned)
 OUTPATIENT PHYSICAL THERAPY TREATMENT SESSION   Patient Name: [facility]. MRN: [id number] DOB:1932-12-18, 89 y.o., male Today's Date: 04/01/2024   PCP: Kenneth Amis, MD  REFERRING PROVIDER: Alla Amis, MD   END OF SESSION:  PT End of Session - 04/01/24 1406     Visit Number 4    Number of Visits 24    Date for Recertification  06/03/24    Progress Note Due on Visit 10    PT Start Time 1405    PT Stop Time 1445    PT Time Calculation (min) 40 min    Equipment Utilized During Treatment Gait belt    Activity Tolerance Patient tolerated treatment well;No increased pain    Behavior During Therapy [facility] for tasks assessed/performed          Past Medical History:  Diagnosis Date   Allergic rhinitis    Anemia    B12 deficiency 06/29/2015   Benign neoplasm of ascending colon    Benign neoplasm of descending colon    Colon polyp    Colostomy in place [facility])    DD (diverticular disease) 06/29/2015   Dementia (HCC)    memory issues   Fatty liver    GERD (gastroesophageal reflux disease)    Glaucoma    Hyperlipidemia    Hypertension    Rectal adenocarcinoma (HCC) 07/15/2015   Substance abuse (HCC)    alcohol   Traumatic intraparenchymal hemorrhage (HCC)    Past Surgical History:  Procedure Laterality Date   ABDOMINOPERINEAL PROCTOCOLECTOMY  12/17/2015   [facility]   CATARACT EXTRACTION W/ INTRAOCULAR LENS IMPLANT     COLONOSCOPY WITH PROPOFOL  N/A 07/10/2015   Procedure: COLONOSCOPY WITH PROPOFOL ;  Surgeon: Rogelia Copping, MD;  [facility];  Service: Endoscopy;  Laterality: N/A;  PER CAREGIVER WAS TOLD THAT PT WOULD BE 1ST (KEEP PT EARLY AM)   COLONOSCOPY WITH PROPOFOL  N/A 04/24/2017   Procedure: COLONOSCOPY WITH PROPOFOL ;  Surgeon: Copping Rogelia, MD;  Location: [facility];  Service: Endoscopy;  Laterality: N/A;   INSERT / REPLACE / REMOVE PACEMAKER  01/08/2020   PACEMAKER INSERTION Left 01/07/2020   Procedure: INSERTION PACEMAKER;   Surgeon: Ammon Blunt, MD;  Location: [facility];  Service: Cardiovascular;  Laterality: Left;   PACEMAKER LEADLESS INSERTION N/A 01/07/2020   Procedure: PACEMAKER LEADLESS INSERTION;  Surgeon: Ammon Blunt, MD;  Location: [facility];  Service: Cardiovascular;  Laterality: N/A;   PERIPHERAL VASCULAR BALLOON ANGIOPLASTY N/A 01/07/2020   Procedure: PERIPHERAL VASCULAR BALLOON ANGIOPLASTY;  Surgeon: Marea Selinda RAMAN, MD;  Location: [facility];  Service: Cardiovascular;  Laterality: N/A;   PERIPHERAL VASCULAR BALLOON ANGIOPLASTY N/A 01/07/2020   Procedure: PERIPHERAL VASCULAR BALLOON ANGIOPLASTY;  Surgeon: Jama Cordella MATSU, MD;  Location: [facility];  Service: Cardiovascular;  Laterality: N/A;   PERIPHERAL VASCULAR THROMBECTOMY Right 01/20/2020   Procedure: PERIPHERAL VASCULAR THROMBECTOMY;  Surgeon: Marea Selinda RAMAN, MD;  Location: [facility];  Service: Cardiovascular;  Laterality: Right;   POLYPECTOMY  07/10/2015   Procedure: POLYPECTOMY;  Surgeon: Rogelia Copping, MD;  [facility];  Service: Endoscopy;;   Patient Active Problem List   Diagnosis Date Noted   Chronic right shoulder pain 04/29/2020   Colostomy in place [facility]) 04/29/2020   Acute deep vein thrombosis (DVT) of femoral vein (HCC) 01/17/2020   DVT (deep venous thrombosis) (HCC) 01/14/2020   Sick sinus syndrome (HCC) 01/07/2020   Cardiac syncope 12/25/2019   Persistent atrial fibrillation (HCC) 11/15/2019  GERD without esophagitis 04/25/2018   Parastomal hernia without obstruction or gangrene 06/19/2017   Personal history of colon cancer    Benign neoplasm of ascending colon    Allergic rhinitis 01/30/2017   Encounter for general adult medical examination without abnormal findings 10/03/2016   Vaccine counseling 10/03/2016   Palmar plantar erythrodysesthesia 04/17/2016   Iron deficiency anemia 03/07/2016   Complete tear of right rotator cuff 11/16/2015   Rotator cuff  tendinitis, right 11/16/2015   Hyponatremia 10/09/2015   Rash of face 10/09/2015   Intracranial hemorrhage (HCC) 08/18/2015   Low back pain 08/18/2015   T12 compression fracture (HCC) 08/10/2015   Acute diverticulitis 08/08/2015   Generalized weakness 08/08/2015   Alcohol dependence (HCC) 08/08/2015   Diverticulitis 08/08/2015   Intraparenchymal hemorrhage of brain (HCC) 07/24/2015   Rectal adenocarcinoma (HCC) 07/15/2015   B12 deficiency 06/29/2015   Dementia (HCC) 06/29/2015   DD (diverticular disease) 06/29/2015   Essential (primary) hypertension 06/29/2015   GI bleed 06/23/2015    ONSET DATE: over the past couple years   REFERRING DIAG:  Z91.81 (ICD-10-CM) - At high risk for falls    THERAPY DIAG:  Abnormal posture  History of falling  Unsteadiness on feet  Muscle weakness (generalized)  Repeated falls  Abnormality of gait  Rationale for Evaluation and Treatment: Rehabilitation  SUBJECTIVE:                                                                                                                                                                                             SUBJECTIVE STATEMENT:  Caregiver reports he has been walking so much better since beginning therapy and has been moving better. She states being happy with his progress. No other updates.  Pt accompanied by: caregiver, answers most questions   PERTINENT HISTORY:  From recent routine medica visit on 03/05/2024: [facility]. is a 89 y.o. here for preventative health exam and subsequent medicare wellness  Preventative health exam: Still has moments of weakness and instability and would like to follow-up with physical therapy. States that physical therapy never contacted them. Chronic medical issues stable and tolerating medications without adverse effects. Still receiving 24-hour care and supervision. Still cutting down alcohol to 1 bottle of wine per week and doing well with  this. Exercises regularly and tries to adhere to healthy diet. No exertional cp or syncopal episodes. No urinary issues or rectal pain/bleeding. Denies any tobacco use.  ROS Review of systems is unremarkable for any active cardiac, respiratory, GI, GU, hematologic, neurologic, dermatologic, HEENT, or psychiatric symptoms except as noted above. No fevers, chills, or constitutional symptoms.  PAIN:  Are you having pain? No  PRECAUTIONS: None  RED FLAGS: None   WEIGHT BEARING RESTRICTIONS: No  FALLS: Has patient fallen in last 6 months? Yes. Number of falls 2 and tripped over the carpet to floor transition  LIVING ENVIRONMENT: Lives with: lives in an assisted living facility, has 24/7 care available Lives in: House/apartment Stairs: No Has following equipment at home: Single point cane and Walker - 2 wheeled  PLOF: Independent  PATIENT GOALS: Getting stronger and preventing falls.  OBJECTIVE:  Note: Objective measures were completed at Evaluation unless otherwise noted.  COGNITION: Overall cognitive status: History of cognitive impairments - at baseline and mild dementia, caregiver answers most questions.   SENSATION: WFL Light touch: WFL B LE  COORDINATION: RAMPS: WFL B H<>S: WFL B  POSTURE: rounded shoulders, forward head, increased thoracic kyphosis, and flexed trunk   Kenneth EXTREMITY MMT:    MMT Right Eval Left Eval  Hip flexion 4+ 4+  Hip extension    Hip abduction 5 5  Hip adduction 5 5  Hip internal rotation    Hip external rotation    Knee flexion 4+ 4+  Knee extension 5 5  Ankle dorsiflexion 4+ 4+  Ankle plantarflexion    Ankle inversion    Ankle eversion    (Blank rows = not tested)   TRANSFERS: Sit to stand: Complete Independence  Assistive device utilized: Single point cane     Stand to sit: Complete Independence  Assistive device utilized: Single point cane      STAIRS: Not tested GAIT: Findings: Gait Characteristics: decreased step  length- Right, decreased step length- Left, decreased stance time- Right, decreased stance time- Left, decreased stride length, decreased trunk rotation, trunk flexed, and wide BOS, Distance walked: distance into and out of gym, distance of functional outcome measures performed this session, Assistive device utilized:Single point cane, Level of assistance: CGA, and Comments: ambulates with SPC improperly  FUNCTIONAL TESTS:  -Pt performed 5 time sit<>stand (5xSTS): 12.56 sec (>15 sec indicates increased fall risk)   -PT instructed pt in TUG: 16.105 sec (average of 3 trials; >13.5 sec indicates increased fall risk)  (trial 1: 15.96s, trial 2: 16.25s)  -6 minute walk test: 6 Min Walk Test:  Instructed patient to ambulate as quickly and as safely as possible for 6 minutes using LRAD. Patient was allowed to take standing rest breaks without stopping the test, but if the patient required a sitting rest break the clock would be stopped and the test would be over.  Results: 825 feet (251.46 meters, Avg speed 0.25m/s) using a SPC with CGA. Results indicate that the patient has reduced endurance with ambulation compared to age matched norms.  Age Matched Norms: 65-69 yo M: 76 F: 68, 55-79 yo M: 56 F: 471, 44-89 yo M: 417 F: 392 MDC: 58.21 meters (190.98 feet) or 50 meters (ANPTA Core Set of Outcome Measures for Adults with Neurologic Conditions, 2018)   -10 Meter Walk Test: Patient instructed to walk 10 meters (32.8 ft) as quickly and as safely as possible at their normal speed x2 and at a fast speed x2. Time measured from 2 meter mark to 8 meter mark to accommodate ramp-up and ramp-down.  Normal speed 1: 0.79 m/s, 12.63s (limited community ambulator) Normal speed 2: 0.77 m/s, 12.98s (limited community ambulator)  Cut off scores: <0.4 m/s = household Ambulator, 0.4-0.8 m/s = limited community Ambulator, >0.8 m/s = community Ambulator, >1.2 m/s = crossing a street, <1.0 = increased fall risk  MCID 0.05  m/s (small), 0.13 m/s (moderate), 0.06 m/s (significant)  (ANPTA Core Set of Outcome Measures for Adults with Neurologic Conditions, 2018)   -Berg Balance Scale: Patient demonstrates increased fall risk as noted by score of 41/56 on Berg Balance Scale.  (<36= high risk for falls, close to 100%; 37-45 significant >80%; 46-51 moderate >50%; 52-55 Kenneth >25%)  Item Test date: 04/01/2024  Date:  Date:   Sitting to standing 4. able to stand without using hands and stabilize independently Insert SmartPhrase OPRCBERGREEVAL Insert SmartPhrase OPRCBERGREEVAL  2. Standing unsupported 3. able to stand 2 minutes with supervision    3. Sitting with back unsupported, feet supported 4. able to sit safely and securely for 2 minutes    4. Standing to sitting 4. sits safely with minimal use of hands    5. Pivot transfer  4. able to transfer safely with minor use of hands    6. Standing unsupported with eyes closed 3. able to stand 10 seconds with supervision    7. Standing unsupported with feet together 3. able to place feet together independently and stand 1 minute with supervision    8. Reaching forward with outstretched arms while standing 3. can reach forward 12 cm (5 inches)    9. Pick up object from the floor from standing 4. able to pick up slipper safely and easily    10. Turning to look behind over left and right shoulders while standing 2. turns sideways but only maintains balance    11. Turn 360 degrees 2. able to turn 360 degrees safely but slowly    12. Place alternate foot on step or stool while standing unsupported 1. able to complete > 2 steps needs minimal assist    13. Standing unsupported one foot in front 3. able to place foot ahead independently and hold 30 seconds    14. Standing on one leg 1. tries to lift leg unable to hold 3 seconds but remains standing independently.      Total Score 41/56 Total Score:    Total Score:      PATIENT SURVEYS:  ABC scale: The Activities-Specific Balance  Confidence (ABC) Scale 0% 10 20 30  40 50 60 70 80 90 100% No confidence<->completely confident  How confident are you that you will not lose your balance or become unsteady when you . . .   Date tested 04/01/2024   Walk around the house 100%  2. Walk up or down stairs 100%  3. Bend over and pick up a slipper from in front of a closet floor 100%  4. Reach for a small can off a shelf at eye level 100%  5. Stand on tip toes and reach for something above your head 50%  6. Stand on a chair and reach for something 20%  7. Sweep the floor 100%  8. Walk outside the house to a car parked in the driveway 100%  9. Get into or out of a car 100%  10. Walk across a parking lot to the mall 100%  11. Walk up or down a ramp 90%  12. Walk in a crowded mall where people rapidly walk past you 100%  13. Are bumped into by people as you walk through the mall 100%  14. Step onto or off of an escalator while you are holding onto the railing 65%  15. Step onto or off an escalator while holding onto parcels such that you cannot hold onto the railing 50%  16.  Walk outside on icy sidewalks 20%  Total: #/16  80.94%                                                                                                                                 TREATMENT DATE: 04/01/2024  Hallway ambulation w/ SPC and 2# AW on B LE, 3 laps in hallway with pivot turns Hallway sideways ambulation w/ 2# AW on B LE; 2 laps side to side for half the hallway (15ft) Standing hip abduction at raised mat, 2# AW on B LE, 2x10 each side Alternating step taps onto 6in step in // bars, cue for only one hand support on bar Lateral step ups onto foam pad in // bars, B UE support on bar, 2x10 each side, CGA from SPT Split stance balance on 4in step, back foot on solid ground; 3x30s each side eyes open, CGA-min assist from SPT Blue foam pad static stance EC, 3x30s, min-mod assist from SPT Blue foam pad static stance EO, 2x30s, CGA from SPT 3  laps up and down stairs w/ 2# AW on B LE, B UE support on rails, reciprocal gait on ascent and descent, supervision from SPT High knee marching in // bars, B UE support, 4 laps, supervision assist from SPT Cone weaving ambulation with SPC, 6 cones placed 1.5 ft apart, CGA from SPT, 2 cone knock overs   PATIENT EDUCATION: Education details: Educated in performance of HEP program, timing of therapy session, goals and impairments Person educated: Patient and Caregiver   Education method: Explanation, Demonstration, and Handouts Education comprehension: verbalized understanding and returned demonstration  HOME EXERCISE PROGRAM: Access Code: [email or web address] URL: [email or web address] Date: 03/11/2024 Prepared by: Massie Dollar  Exercises - Standing March with Counter Support  - 1 x daily - 7 x weekly - 3 sets - 10 reps - 3 second hold - Heel Toe Raises with Counter Support  - 1 x daily - 7 x weekly - 3 sets - 10 reps - 3 second hold  GOALS: Goals reviewed with patient? No   SHORT TERM GOALS: Target date: 04/08/2024  Patient will be independent in home exercise program to improve strength/mobility for better functional independence with ADLs. Baseline: Goal status: INITIAL   LONG TERM GOALS: Target date: 06/03/2024   Patient will increase ABC scale score >90% to demonstrate better functional mobility and better confidence with ADLs.  Baseline: 80.9375% Goal status: INITIAL  2.  Patient (> 24 years old) will complete five times sit to stand test in < 11 seconds indicating an increased LE strength and improved balance. Baseline: 12.56s Goal status: INITIAL  3.  Patient will increase Berg Balance score by > 6 points to demonstrate decreased fall risk during functional activities Baseline: 41/56 Goal status: INITIAL  4.  Patient will increase 10 meter walk test to >1.36m/s as to improve gait speed for better community ambulation and to reduce fall risk. Baseline: 0.78  m/s Goal status: INITIAL  5.  Patient will reduce timed up and go to <13 seconds to reduce fall risk and demonstrate improved transfer/gait ability. Baseline: 16.105s Goal status: INITIAL  6. Patient will improve by 110ft in order to improve ambulation distance and increase endurance for community ambulation. Baseline: next session* Goal status: INITIAL  ASSESSMENT:  CLINICAL IMPRESSION: Pt arrived motivated and ready for session, and continues to show good tolerance to activity. Pt needed slightly more assistance during foam pad and split stance balance activities today, showing more postural sway and difficulty with split stance balance with no UE support. Pt continues to need significant and repeated verbal cues in term of activity performance, directions, and duration. Pt appears to have significant visuo-spatial deficits, only able to see straight ahead, and getting confused on location of spaces in the clinic. Minor difficulty notes with sideways hallway ambulation today, but showed good balance. Introduced cone weaving ambulation with right colored cones to mimic obstacle navigation in the home environment and encouragement of successful avoidance of tripping hazards on floor. Pt will continue to benefit from skilled therapy to address remaining deficits in order to improve overall QoL and return to PLOF.   OBJECTIVE IMPAIRMENTS: Abnormal gait, decreased activity tolerance, decreased balance, decreased cognition, decreased endurance, decreased knowledge of use of DME, decreased mobility, difficulty walking, impaired UE functional use, improper body mechanics, and postural dysfunction.   ACTIVITY LIMITATIONS: carrying, lifting, stairs, reach over head, and hygiene/grooming  PARTICIPATION LIMITATIONS: medication management, community activity, and yard work  PERSONAL FACTORS: Age, Past/current experiences, Time since onset of injury/illness/exacerbation, and 3+ comorbidities: hx of  cancer, mild dementia associated w/ alcoholism, chronic atrial fibrillation are also affecting patient's functional outcome.   REHAB POTENTIAL: Good  CLINICAL DECISION MAKING: Evolving/moderate complexity  EVALUATION COMPLEXITY: Moderate  PLAN:  PT FREQUENCY: 2x/week  PT DURATION: 12 weeks  PLANNED INTERVENTIONS: 97164- PT Re-evaluation, 97750- Physical Performance Testing, 97110-Therapeutic exercises, 97530- Therapeutic activity, 97112- Neuromuscular re-education, 97535- Self Care, [id number]- Manual therapy, [phone]- Gait training, [phone]- Orthotic Initial, [phone]- Orthotic/Prosthetic subsequent, [phone]- Aquatic Therapy, [phone]- Electrical stimulation (unattended), [phone] (1-2 muscles), [id number] (3+ muscles)- Dry Needling, Patient/Family education, Balance training, Stair training, Taping, Joint mobilization, Joint manipulation, Spinal mobilization, Vestibular training, DME instructions, Wheelchair mobility training, Cryotherapy, and Moist heat  PLAN FOR NEXT SESSION:  -dynamic balance and ambulation training -SLS activities -reemphasize HEP exercises and importance -work on proper use and mechanics of SPC  [facility], SPT 04/01/2024, 2:07 PM

## 2024-04-03 ENCOUNTER — Ambulatory Visit: Admitting: Physical Therapy

## 2024-04-03 DIAGNOSIS — M6281 Muscle weakness (generalized): Secondary | ICD-10-CM

## 2024-04-03 DIAGNOSIS — R296 Repeated falls: Secondary | ICD-10-CM

## 2024-04-03 DIAGNOSIS — R293 Abnormal posture: Secondary | ICD-10-CM

## 2024-04-03 DIAGNOSIS — R269 Unspecified abnormalities of gait and mobility: Secondary | ICD-10-CM

## 2024-04-03 DIAGNOSIS — Z9181 History of falling: Secondary | ICD-10-CM

## 2024-04-03 DIAGNOSIS — R2681 Unsteadiness on feet: Secondary | ICD-10-CM

## 2024-04-03 NOTE — Therapy (Unsigned)
 OUTPATIENT PHYSICAL THERAPY TREATMENT SESSION   Patient Name: Kenneth Luna. MRN: [id number] DOB:1933-03-09, 89 y.o., male Today's Date: 04/03/2024   PCP: Alla Amis, MD  REFERRING PROVIDER: Alla Amis, MD   END OF SESSION:  PT End of Session - 04/03/24 1405     Visit Number 6    Number of Visits 24    Date for Recertification  06/03/24    Progress Note Due on Visit 10    PT Start Time 1405    PT Stop Time 1445    PT Time Calculation (min) 40 min    Equipment Utilized During Treatment Gait belt    Activity Tolerance Patient tolerated treatment well    Behavior During Therapy [facility] for tasks assessed/performed          Past Medical History:  Diagnosis Date   Allergic rhinitis    Anemia    B12 deficiency 06/29/2015   Benign neoplasm of ascending colon    Benign neoplasm of descending colon    Colon polyp    Colostomy in place [facility])    DD (diverticular disease) 06/29/2015   Dementia (HCC)    memory issues   Fatty liver    GERD (gastroesophageal reflux disease)    Glaucoma    Hyperlipidemia    Hypertension    Rectal adenocarcinoma (HCC) 07/15/2015   Substance abuse (HCC)    alcohol   Traumatic intraparenchymal hemorrhage (HCC)    Past Surgical History:  Procedure Laterality Date   ABDOMINOPERINEAL PROCTOCOLECTOMY  12/17/2015   [facility]   CATARACT EXTRACTION W/ INTRAOCULAR LENS IMPLANT     COLONOSCOPY WITH PROPOFOL  N/A 07/10/2015   Procedure: COLONOSCOPY WITH PROPOFOL ;  Surgeon: Rogelia Copping, MD;  Location: [facility];  Service: Endoscopy;  Laterality: N/A;  PER CAREGIVER WAS TOLD THAT PT WOULD BE 1ST (KEEP PT EARLY AM)   COLONOSCOPY WITH PROPOFOL  N/A 04/24/2017   Procedure: COLONOSCOPY WITH PROPOFOL ;  Surgeon: Copping Rogelia, MD;  Location: [facility];  Service: Endoscopy;  Laterality: N/A;   INSERT / REPLACE / REMOVE PACEMAKER  01/08/2020   PACEMAKER INSERTION Left 01/07/2020   Procedure: INSERTION PACEMAKER;  Surgeon: Ammon Blunt, MD;  Location: [facility] ORS;  Service: Cardiovascular;  Laterality: Left;   PACEMAKER LEADLESS INSERTION N/A 01/07/2020   Procedure: PACEMAKER LEADLESS INSERTION;  Surgeon: Ammon Blunt, MD;  Location: [facility] INVASIVE CV LAB;  Service: Cardiovascular;  Laterality: N/A;   PERIPHERAL VASCULAR BALLOON ANGIOPLASTY N/A 01/07/2020   Procedure: PERIPHERAL VASCULAR BALLOON ANGIOPLASTY;  Surgeon: Marea Selinda RAMAN, MD;  Location: [facility] INVASIVE CV LAB;  Service: Cardiovascular;  Laterality: N/A;   PERIPHERAL VASCULAR BALLOON ANGIOPLASTY N/A 01/07/2020   Procedure: PERIPHERAL VASCULAR BALLOON ANGIOPLASTY;  Surgeon: Jama Cordella MATSU, MD;  Location: [facility] INVASIVE CV LAB;  Service: Cardiovascular;  Laterality: N/A;   PERIPHERAL VASCULAR THROMBECTOMY Right 01/20/2020   Procedure: PERIPHERAL VASCULAR THROMBECTOMY;  Surgeon: Marea Selinda RAMAN, MD;  Location: [facility] INVASIVE CV LAB;  Service: Cardiovascular;  Laterality: Right;   POLYPECTOMY  07/10/2015   Procedure: POLYPECTOMY;  Surgeon: Rogelia Copping, MD;  Location: [facility];  Service: Endoscopy;;   Patient Active Problem List   Diagnosis Date Noted   Chronic right shoulder pain 04/29/2020   Colostomy in place [facility]) 04/29/2020   Acute deep vein thrombosis (DVT) of femoral vein (HCC) 01/17/2020   DVT (deep venous thrombosis) (HCC) 01/14/2020   Sick sinus syndrome (HCC) 01/07/2020   Cardiac syncope 12/25/2019   Persistent atrial fibrillation (HCC) 11/15/2019   GERD  without esophagitis 04/25/2018   Parastomal hernia without obstruction or gangrene 06/19/2017   Personal history of colon cancer    Benign neoplasm of ascending colon    Allergic rhinitis 01/30/2017   Encounter for general adult medical examination without abnormal findings 10/03/2016   Vaccine counseling 10/03/2016   Palmar plantar erythrodysesthesia 04/17/2016   Iron deficiency anemia 03/07/2016   Complete tear of right rotator cuff 11/16/2015   Rotator cuff tendinitis, right  11/16/2015   Hyponatremia 10/09/2015   Rash of face 10/09/2015   Intracranial hemorrhage (HCC) 08/18/2015   Low back pain 08/18/2015   T12 compression fracture (HCC) 08/10/2015   Acute diverticulitis 08/08/2015   Generalized weakness 08/08/2015   Alcohol dependence (HCC) 08/08/2015   Diverticulitis 08/08/2015   Intraparenchymal hemorrhage of brain (HCC) 07/24/2015   Rectal adenocarcinoma (HCC) 07/15/2015   B12 deficiency 06/29/2015   Dementia (HCC) 06/29/2015   DD (diverticular disease) 06/29/2015   Essential (primary) hypertension 06/29/2015   GI bleed 06/23/2015    ONSET DATE: over the past couple years   REFERRING DIAG:  Z91.81 (ICD-10-CM) - At high risk for falls    THERAPY DIAG:  Abnormal posture  Abnormality of gait  History of falling  Unsteadiness on feet  Muscle weakness (generalized)  Repeated falls  Rationale for Evaluation and Treatment: Rehabilitation  SUBJECTIVE:                                                                                                                                                                                             SUBJECTIVE STATEMENT: Says he is feeling pretty good today, just a little tired.  Pt accompanied by: caregiver, answers most questions   PERTINENT HISTORY:  From recent routine medica visit on 03/05/2024: Kenneth Luna. is a 89 y.o. here for preventative health exam and subsequent medicare wellness  Preventative health exam: Still has moments of weakness and instability and would like to follow-up with physical therapy. States that physical therapy never contacted them. Chronic medical issues stable and tolerating medications without adverse effects. Still receiving 24-hour care and supervision. Still cutting down alcohol to 1 bottle of wine per week and doing well with this. Exercises regularly and tries to adhere to healthy diet. No exertional cp or syncopal episodes. No urinary issues or rectal  pain/bleeding. Denies any tobacco use.  ROS Review of systems is unremarkable for any active cardiac, respiratory, GI, GU, hematologic, neurologic, dermatologic, HEENT, or psychiatric symptoms except as noted above. No fevers, chills, or constitutional symptoms.  PAIN:  Are you having pain? No  PRECAUTIONS: None  RED FLAGS: None   WEIGHT BEARING  RESTRICTIONS: No  FALLS: Has patient fallen in last 6 months? Yes. Number of falls 2 and tripped over the carpet to floor transition  LIVING ENVIRONMENT: Lives with: lives in an assisted living facility, has 24/7 care available Lives in: House/apartment Stairs: No Has following equipment at home: Single point cane and Walker - 2 wheeled  PLOF: Independent  PATIENT GOALS: Getting stronger and preventing falls.  OBJECTIVE:  Note: Objective measures were completed at Evaluation unless otherwise noted.  COGNITION: Overall cognitive status: History of cognitive impairments - at baseline and mild dementia, caregiver answers most questions.   SENSATION: WFL Light touch: WFL B LE  COORDINATION: RAMPS: WFL B H<>S: WFL B  POSTURE: rounded shoulders, forward head, increased thoracic kyphosis, and flexed trunk   LOWER EXTREMITY MMT:    MMT Right Eval Left Eval  Hip flexion 4+ 4+  Hip extension    Hip abduction 5 5  Hip adduction 5 5  Hip internal rotation    Hip external rotation    Knee flexion 4+ 4+  Knee extension 5 5  Ankle dorsiflexion 4+ 4+  Ankle plantarflexion    Ankle inversion    Ankle eversion    (Blank rows = not tested)   TRANSFERS: Sit to stand: Complete Independence  Assistive device utilized: Single point cane     Stand to sit: Complete Independence  Assistive device utilized: Single point cane      STAIRS: Not tested GAIT: Findings: Gait Characteristics: decreased step length- Right, decreased step length- Left, decreased stance time- Right, decreased stance time- Left, decreased stride length,  decreased trunk rotation, trunk flexed, and wide BOS, Distance walked: distance into and out of gym, distance of functional outcome measures performed this session, Assistive device utilized:Single point cane, Level of assistance: CGA, and Comments: ambulates with SPC improperly  FUNCTIONAL TESTS:  -Pt performed 5 time sit<>stand (5xSTS): 12.56 sec (>15 sec indicates increased fall risk)   -PT instructed pt in TUG: 16.105 sec (average of 3 trials; >13.5 sec indicates increased fall risk)  (trial 1: 15.96s, trial 2: 16.25s)  -6 minute walk test: 6 Min Walk Test:  Instructed patient to ambulate as quickly and as safely as possible for 6 minutes using LRAD. Patient was allowed to take standing rest breaks without stopping the test, but if the patient required a sitting rest break the clock would be stopped and the test would be over.  Results: 825 feet (251.46 meters, Avg speed 0.20m/s) using a SPC with CGA. Results indicate that the patient has reduced endurance with ambulation compared to age matched norms.  Age Matched Norms: 66-69 yo M: 27 F: 33, 26-79 yo M: 30 F: 471, 62-89 yo M: 417 F: 392 MDC: 58.21 meters (190.98 feet) or 50 meters (ANPTA Core Set of Outcome Measures for Adults with Neurologic Conditions, 2018)   -10 Meter Walk Test: Patient instructed to walk 10 meters (32.8 ft) as quickly and as safely as possible at their normal speed x2 and at a fast speed x2. Time measured from 2 meter mark to 8 meter mark to accommodate ramp-up and ramp-down.  Normal speed 1: 0.79 m/s, 12.63s (limited community ambulator) Normal speed 2: 0.77 m/s, 12.98s (limited community ambulator)  Cut off scores: <0.4 m/s = household Ambulator, 0.4-0.8 m/s = limited community Ambulator, >0.8 m/s = community Ambulator, >1.2 m/s = crossing a street, <1.0 = increased fall risk MCID 0.05 m/s (small), 0.13 m/s (moderate), 0.06 m/s (significant)  (ANPTA Core Set of Outcome Measures for  Adults with Neurologic  Conditions, 2018)   -Berg Balance Scale: Patient demonstrates increased fall risk as noted by score of 41/56 on Berg Balance Scale.  (<36= high risk for falls, close to 100%; 37-45 significant >80%; 46-51 moderate >50%; 52-55 lower >25%)  Item Test date: 04/03/2024  Date:  Date:   Sitting to standing 4. able to stand without using hands and stabilize independently Insert SmartPhrase OPRCBERGREEVAL Insert SmartPhrase OPRCBERGREEVAL  2. Standing unsupported 3. able to stand 2 minutes with supervision    3. Sitting with back unsupported, feet supported 4. able to sit safely and securely for 2 minutes    4. Standing to sitting 4. sits safely with minimal use of hands    5. Pivot transfer  4. able to transfer safely with minor use of hands    6. Standing unsupported with eyes closed 3. able to stand 10 seconds with supervision    7. Standing unsupported with feet together 3. able to place feet together independently and stand 1 minute with supervision    8. Reaching forward with outstretched arms while standing 3. can reach forward 12 cm (5 inches)    9. Pick up object from the floor from standing 4. able to pick up slipper safely and easily    10. Turning to look behind over left and right shoulders while standing 2. turns sideways but only maintains balance    11. Turn 360 degrees 2. able to turn 360 degrees safely but slowly    12. Place alternate foot on step or stool while standing unsupported 1. able to complete > 2 steps needs minimal assist    13. Standing unsupported one foot in front 3. able to place foot ahead independently and hold 30 seconds    14. Standing on one leg 1. tries to lift leg unable to hold 3 seconds but remains standing independently.      Total Score 41/56 Total Score:    Total Score:      PATIENT SURVEYS:  ABC scale: The Activities-Specific Balance Confidence (ABC) Scale 0% 10 20 30  40 50 60 70 80 90 100% No confidence<->completely confident  How confident are you  that you will not lose your balance or become unsteady when you . . .   Date tested 04/03/2024   Walk around the house 100%  2. Walk up or down stairs 100%  3. Bend over and pick up a slipper from in front of a closet floor 100%  4. Reach for a small can off a shelf at eye level 100%  5. Stand on tip toes and reach for something above your head 50%  6. Stand on a chair and reach for something 20%  7. Sweep the floor 100%  8. Walk outside the house to a car parked in the driveway 100%  9. Get into or out of a car 100%  10. Walk across a parking lot to the mall 100%  11. Walk up or down a ramp 90%  12. Walk in a crowded mall where people rapidly walk past you 100%  13. Are bumped into by people as you walk through the mall 100%  14. Step onto or off of an escalator while you are holding onto the railing 65%  15. Step onto or off an escalator while holding onto parcels such that you cannot hold onto the railing 50%  16. Walk outside on icy sidewalks 20%  Total: #/16  80.94%  TREATMENT DATE: 04/03/2024  Hallway ambulation with L/R head turns w/ SPC and 2# AW on B LE, 3 laps in hallway with pivot turns Hallway ambulation with vertical head turns w/ SPC and 2# AW on B LE, 2 laps in hallway with pivot turns Cone weaving ambulation with SPC, 6 cones placed 1.5 ft apart, CGA from SPT, 3 laps  Standing hip abduction at raised mat, 2# AW on B LE, 2x10 each side Standing hip flexion at raised mat, 2# AW on B LE, 2x10 each side Standing hip extension at raised mat, 2# AW on B LE, 2x10 each side Blue foam pad static stance EC, 2x30s, min-assist from SPT Blue foam pad static stance EO, 2x30s, CGA from SPT Lateral step ups onto foam pad, B UE support on bar, x10 each side, CGA from SPT Step ups onto foam pad, U UE support on bar, x10 each side, CGA from SPT Static stance  on foam pad, with crossbody reach to colorful cones and placement to the opposite side, no UE support, 2x each side  PATIENT EDUCATION: Education details: Educated in international aid/development worker of HEP program, timing of therapy session, goals and impairments Person educated: Patient and Caregiver   Education method: Explanation, Facilities Manager, and Handouts Education comprehension: verbalized understanding and returned demonstration  HOME EXERCISE PROGRAM: Access Code: [email or web address] URL: [email or web address] Date: 03/11/2024 Prepared by: Massie Dollar  Exercises - Standing March with Counter Support  - 1 x daily - 7 x weekly - 3 sets - 10 reps - 3 second hold - Heel Toe Raises with Counter Support  - 1 x daily - 7 x weekly - 3 sets - 10 reps - 3 second hold  GOALS: Goals reviewed with patient? No   SHORT TERM GOALS: Target date: 04/08/2024  Patient will be independent in home exercise program to improve strength/mobility for better functional independence with ADLs. Baseline: Goal status: INITIAL   LONG TERM GOALS: Target date: 06/03/2024   Patient will increase ABC scale score >90% to demonstrate better functional mobility and better confidence with ADLs.  Baseline: 80.9375% Goal status: INITIAL  2.  Patient (> 71 years old) will complete five times sit to stand test in < 11 seconds indicating an increased LE strength and improved balance. Baseline: 12.56s Goal status: INITIAL  3.  Patient will increase Berg Balance score by > 6 points to demonstrate decreased fall risk during functional activities Baseline: 41/56 Goal status: INITIAL  4.  Patient will increase 10 meter walk test to >1.10m/s as to improve gait speed for better community ambulation and to reduce fall risk. Baseline: 0.78 m/s Goal status: INITIAL  5.  Patient will reduce timed up and go to <13 seconds to reduce fall risk and demonstrate improved transfer/gait ability. Baseline: 16.105s Goal status:  INITIAL  6. Patient will improve by 13ft in order to improve ambulation distance and increase endurance for community ambulation. Baseline: next session* Goal status: INITIAL  ASSESSMENT:  CLINICAL IMPRESSION: Pt continues to arrive motivated and ready for activity, showing and reporting little to no fatigue following all activities. Pt showed great ambulation distance today performing all walking activities back-to-back, with no break needed. Pt continues to show significant directional and visual challenge, needing visual and verbal cues to orient himself to the environment. Was able to improve static standing balance today with verbal cue to place more weight in the back of the heels, as he was felt falling anteriorly during foam balance activities. Pt L shoulder is limited in range,  so he was unable to raise the arm enough to stack the cones on the L. Pt will continue to benefit from skilled therapy to address remaining deficits in order to improve overall QoL and return to PLOF.    OBJECTIVE IMPAIRMENTS: Abnormal gait, decreased activity tolerance, decreased balance, decreased cognition, decreased endurance, decreased knowledge of use of DME, decreased mobility, difficulty walking, impaired UE functional use, improper body mechanics, and postural dysfunction.   ACTIVITY LIMITATIONS: carrying, lifting, stairs, reach over head, and hygiene/grooming  PARTICIPATION LIMITATIONS: medication management, community activity, and yard work  PERSONAL FACTORS: Age, Past/current experiences, Time since onset of injury/illness/exacerbation, and 3+ comorbidities: hx of cancer, mild dementia associated w/ alcoholism, chronic atrial fibrillation are also affecting patient's functional outcome.   REHAB POTENTIAL: Good  CLINICAL DECISION MAKING: Evolving/moderate complexity  EVALUATION COMPLEXITY: Moderate  PLAN:  PT FREQUENCY: 2x/week  PT DURATION: 12 weeks  PLANNED INTERVENTIONS: 97164- PT  Re-evaluation, 97750- Physical Performance Testing, 97110-Therapeutic exercises, 97530- Therapeutic activity, 97112- Neuromuscular re-education, 97535- Self Care, [id number]- Manual therapy, [phone]- Gait training, [phone]- Orthotic Initial, [phone]- Orthotic/Prosthetic subsequent, [phone]- Aquatic Therapy, [phone]- Electrical stimulation (unattended), [phone] (1-2 muscles), [phone] (3+ muscles)- Dry Needling, Patient/Family education, Balance training, Stair training, Taping, Joint mobilization, Joint manipulation, Spinal mobilization, Vestibular training, DME instructions, Wheelchair mobility training, Cryotherapy, and Moist heat  PLAN FOR NEXT SESSION:  -dynamic balance and ambulation training -SLS activities -reemphasize HEP exercises and importance -work on proper use and mechanics of SPC  Corinne Bethany, SPT 04/03/2024, 2:06 PM    I have read and reviewed the attached note and am in agreement with the documentation provided.     This licensed clinician was present and actively directing care throughout the session at all times.   Massie Dollar PT, DPT  Physical Therapist - [place]  [facility]  7:51 AM 04/04/2024

## 2024-04-08 ENCOUNTER — Ambulatory Visit: Admitting: Physical Therapy

## 2024-04-08 DIAGNOSIS — R296 Repeated falls: Secondary | ICD-10-CM

## 2024-04-08 DIAGNOSIS — M6281 Muscle weakness (generalized): Secondary | ICD-10-CM

## 2024-04-08 DIAGNOSIS — R269 Unspecified abnormalities of gait and mobility: Secondary | ICD-10-CM

## 2024-04-08 DIAGNOSIS — R2681 Unsteadiness on feet: Secondary | ICD-10-CM

## 2024-04-08 DIAGNOSIS — R293 Abnormal posture: Secondary | ICD-10-CM | POA: Diagnosis not present

## 2024-04-08 DIAGNOSIS — Z9181 History of falling: Secondary | ICD-10-CM

## 2024-04-08 NOTE — Therapy (Signed)
 " OUTPATIENT PHYSICAL THERAPY TREATMENT SESSION   Patient Name: Kenneth Luna. MRN: [id number] DOB:April 12, 1933, 89 y.o., male Today's Date: 04/08/2024   PCP: Alla Amis, MD  REFERRING PROVIDER: Alla Amis, MD   END OF SESSION:  PT End of Session - 04/08/24 1533     Visit Number 7    Number of Visits 24    Date for Recertification  06/03/24    Progress Note Due on Visit 10    PT Start Time 1405    PT Stop Time 1445    PT Time Calculation (min) 40 min    Equipment Utilized During Treatment Gait belt    Activity Tolerance Patient tolerated treatment well    Behavior During Therapy [facility] for tasks assessed/performed           Past Medical History:  Diagnosis Date   Allergic rhinitis    Anemia    B12 deficiency 06/29/2015   Benign neoplasm of ascending colon    Benign neoplasm of descending colon    Colon polyp    Colostomy in place [facility])    DD (diverticular disease) 06/29/2015   Dementia (HCC)    memory issues   Fatty liver    GERD (gastroesophageal reflux disease)    Glaucoma    Hyperlipidemia    Hypertension    Rectal adenocarcinoma (HCC) 07/15/2015   Substance abuse (HCC)    alcohol   Traumatic intraparenchymal hemorrhage (HCC)    Past Surgical History:  Procedure Laterality Date   ABDOMINOPERINEAL PROCTOCOLECTOMY  12/17/2015   [facility]   CATARACT EXTRACTION W/ INTRAOCULAR LENS IMPLANT     COLONOSCOPY WITH PROPOFOL  N/A 07/10/2015   Procedure: COLONOSCOPY WITH PROPOFOL ;  Surgeon: Rogelia Copping, MD;  Location: [facility];  Service: Endoscopy;  Laterality: N/A;  PER CAREGIVER WAS TOLD THAT PT WOULD BE 1ST (KEEP PT EARLY AM)   COLONOSCOPY WITH PROPOFOL  N/A 04/24/2017   Procedure: COLONOSCOPY WITH PROPOFOL ;  Surgeon: Copping Rogelia, MD;  Location: [facility];  Service: Endoscopy;  Laterality: N/A;   INSERT / REPLACE / REMOVE PACEMAKER  01/08/2020   PACEMAKER INSERTION Left 01/07/2020   Procedure: INSERTION PACEMAKER;  Surgeon:  Ammon Blunt, MD;  Location: [facility] ORS;  Service: Cardiovascular;  Laterality: Left;   PACEMAKER LEADLESS INSERTION N/A 01/07/2020   Procedure: PACEMAKER LEADLESS INSERTION;  Surgeon: Ammon Blunt, MD;  Location: [facility] INVASIVE CV LAB;  Service: Cardiovascular;  Laterality: N/A;   PERIPHERAL VASCULAR BALLOON ANGIOPLASTY N/A 01/07/2020   Procedure: PERIPHERAL VASCULAR BALLOON ANGIOPLASTY;  Surgeon: Marea Selinda RAMAN, MD;  Location: [facility] INVAS[facility] CV LAB;  Service: Cardiovascular;  Laterality: N/A;   PERIPHERAL VASCULAR BALLOON ANGIOPLASTY N/A 01/07/2020   Procedure: PERIPHERAL VASCULAR BALLOON ANGIOPLASTY;  Surgeon: Jama Cordella MATSU, MD;  Location: [facility] INV[facility] CV LAB;  Service: Cardiovascular;  Laterality: N/A;   PERIPHERAL VASCULAR THROMBECTOMY Right 01/20/2020   Procedure: PERIPHERAL VASCULAR THROMBECTOMY;  Surgeon: Marea Selinda RAMAN, MD;  Location: [facility] INV[facility] CV LAB;  Service: Cardiovascular;  Laterality: Right;   POLYPECTOMY  07/10/2015   Procedure: POLYPECTOMY;  Surgeon: Rogelia Copping, MD;  Location: [facility];  Service: Endoscopy;;   Patient Active Problem List   Diagnosis Date Noted   Chronic right shoulder pain 04/29/2020   Colostomy in place [facility]) 04/29/2020   Acute deep vein thrombosis (DVT) of femoral vein (HCC) 01/17/2020   DVT (deep venous thrombosis) (HCC) 01/14/2020   Sick sinus syndrome (HCC) 01/07/2020   Cardiac syncope 12/25/2019   Persistent atrial fibrillation (HCC) 11/15/2019  GERD without esophagitis 04/25/2018   Parastomal hernia without obstruction or gangrene 06/19/2017   Personal history of colon cancer    Benign neoplasm of ascending colon    Allergic rhinitis 01/30/2017   Encounter for general adult medical examination without abnormal findings 10/03/2016   Vaccine counseling 10/03/2016   Palmar plantar erythrodysesthesia 04/17/2016   Iron deficiency anemia 03/07/2016   Complete tear of right rotator cuff 11/16/2015   Rotator cuff tendinitis,  right 11/16/2015   Hyponatremia 10/09/2015   Rash of face 10/09/2015   Intracranial hemorrhage (HCC) 08/18/2015   Low back pain 08/18/2015   T12 compression fracture (HCC) 08/10/2015   Acute diverticulitis 08/08/2015   Generalized weakness 08/08/2015   Alcohol dependence (HCC) 08/08/2015   Diverticulitis 08/08/2015   Intraparenchymal hemorrhage of brain (HCC) 07/24/2015   Rectal adenocarcinoma (HCC) 07/15/2015   B12 deficiency 06/29/2015   Dementia (HCC) 06/29/2015   DD (diverticular disease) 06/29/2015   Essential (primary) hypertension 06/29/2015   GI bleed 06/23/2015    ONSET DATE: over the past couple years   REFERRING DIAG:  Z91.81 (ICD-10-CM) - At high risk for falls    THERAPY DIAG:  Abnormal posture  Abnormality of gait  History of falling  Unsteadiness on feet  Muscle weakness (generalized)  Repeated falls  Rationale for Evaluation and Treatment: Rehabilitation  SUBJECTIVE:                                                                                                                                                                                             SUBJECTIVE STATEMENT:   Pt reports that he is doing well. Did not do much over the weekend. No pain reported, no new falls or trips to report.    Pt accompanied by: caregiver, answers most questions   PERTINENT HISTORY:  From recent routine medica visit on 03/05/2024: Kenneth Luna. is a 89 y.o. here for preventative health exam and subsequent medicare wellness  Preventative health exam: Still has moments of weakness and instability and would like to follow-up with physical therapy. States that physical therapy never contacted them. Chronic medical issues stable and tolerating medications without adverse effects. Still receiving 24-hour care and supervision. Still cutting down alcohol to 1 bottle of wine per week and doing well with this. Exercises regularly and tries to adhere to healthy  diet. No exertional cp or syncopal episodes. No urinary issues or rectal pain/bleeding. Denies any tobacco use.  ROS Review of systems is unremarkable for any active cardiac, respiratory, GI, GU, hematologic, neurologic, dermatologic, HEENT, or psychiatric symptoms except as noted above. No fevers, chills, or constitutional symptoms.  PAIN:  Are you having pain? No  PRECAUTIONS: None  RED FLAGS: None   WEIGHT BEARING RESTRICTIONS: No  FALLS: Has patient fallen in last 6 months? Yes. Number of falls 2 and tripped over the carpet to floor transition  LIVING ENVIRONMENT: Lives with: lives in an assisted living facility, has 24/7 care available Lives in: House/apartment Stairs: No Has following equipment at home: Single point cane and Walker - 2 wheeled  PLOF: Independent  PATIENT GOALS: Getting stronger and preventing falls.  OBJECTIVE:  Note: Objective measures were completed at Evaluation unless otherwise noted.  COGNITION: Overall cognitive status: History of cognitive impairments - at baseline and mild dementia, caregiver answers most questions.   SENSATION: WFL Light touch: WFL B LE  COORDINATION: RAMPS: WFL B H<>S: WFL B  POSTURE: rounded shoulders, forward head, increased thoracic kyphosis, and flexed trunk   LOWER EXTREMITY MMT:    MMT Right Eval Left Eval  Hip flexion 4+ 4+  Hip extension    Hip abduction 5 5  Hip adduction 5 5  Hip internal rotation    Hip external rotation    Knee flexion 4+ 4+  Knee extension 5 5  Ankle dorsiflexion 4+ 4+  Ankle plantarflexion    Ankle inversion    Ankle eversion    (Blank rows = not tested)   TRANSFERS: Sit to stand: Complete Independence  Assistive device utilized: Single point cane     Stand to sit: Complete Independence  Assistive device utilized: Single point cane      STAIRS: Not tested GAIT: Findings: Gait Characteristics: decreased step length- Right, decreased step length- Left, decreased stance  time- Right, decreased stance time- Left, decreased stride length, decreased trunk rotation, trunk flexed, and wide BOS, Distance walked: distance into and out of gym, distance of functional outcome measures performed this session, Assistive device utilized:Single point cane, Level of assistance: CGA, and Comments: ambulates with SPC improperly  FUNCTIONAL TESTS:  -Pt performed 5 time sit<>stand (5xSTS): 12.56 sec (>15 sec indicates increased fall risk)   -PT instructed pt in TUG: 16.105 sec (average of 3 trials; >13.5 sec indicates increased fall risk)  (trial 1: 15.96s, trial 2: 16.25s)  -6 minute walk test: 6 Min Walk Test:  Instructed patient to ambulate as quickly and as safely as possible for 6 minutes using LRAD. Patient was allowed to take standing rest breaks without stopping the test, but if the patient required a sitting rest break the clock would be stopped and the test would be over.  Results: 825 feet (251.46 meters, Avg speed 0.60m/s) using a SPC with CGA. Results indicate that the patient has reduced endurance with ambulation compared to age matched norms.  Age Matched Norms: 59-69 yo M: 60 F: 66, 57-79 yo M: 21 F: 471, 73-89 yo M: 417 F: 392 MDC: 58.21 meters (190.98 feet) or 50 meters (ANPTA Core Set of Outcome Measures for Adults with Neurologic Conditions, 2018)   -10 Meter Walk Test: Patient instructed to walk 10 meters (32.8 ft) as quickly and as safely as possible at their normal speed x2 and at a fast speed x2. Time measured from 2 meter mark to 8 meter mark to accommodate ramp-up and ramp-down.  Normal speed 1: 0.79 m/s, 12.63s (limited community ambulator) Normal speed 2: 0.77 m/s, 12.98s (limited community ambulator)  Cut off scores: <0.4 m/s = household Ambulator, 0.4-0.8 m/s = limited community Ambulator, >0.8 m/s = community Ambulator, >1.2 m/s = crossing a street, <1.0 = increased fall risk  MCID 0.05 m/s (small), 0.13 m/s (moderate), 0.06 m/s (significant)   (ANPTA Core Set of Outcome Measures for Adults with Neurologic Conditions, 2018)   -Berg Balance Scale: Patient demonstrates increased fall risk as noted by score of 41/56 on Berg Balance Scale.  (<36= high risk for falls, close to 100%; 37-45 significant >80%; 46-51 moderate >50%; 52-55 lower >25%)  Item Test date: 04/08/2024  Date:  Date:   Sitting to standing 4. able to stand without using hands and stabilize independently Insert SmartPhrase OPRCBERGREEVAL Insert SmartPhrase OPRCBERGREEVAL  2. Standing unsupported 3. able to stand 2 minutes with supervision    3. Sitting with back unsupported, feet supported 4. able to sit safely and securely for 2 minutes    4. Standing to sitting 4. sits safely with minimal use of hands    5. Pivot transfer  4. able to transfer safely with minor use of hands    6. Standing unsupported with eyes closed 3. able to stand 10 seconds with supervision    7. Standing unsupported with feet together 3. able to place feet together independently and stand 1 minute with supervision    8. Reaching forward with outstretched arms while standing 3. can reach forward 12 cm (5 inches)    9. Pick up object from the floor from standing 4. able to pick up slipper safely and easily    10. Turning to look behind over left and right shoulders while standing 2. turns sideways but only maintains balance    11. Turn 360 degrees 2. able to turn 360 degrees safely but slowly    12. Place alternate foot on step or stool while standing unsupported 1. able to complete > 2 steps needs minimal assist    13. Standing unsupported one foot in front 3. able to place foot ahead independently and hold 30 seconds    14. Standing on one leg 1. tries to lift leg unable to hold 3 seconds but remains standing independently.      Total Score 41/56 Total Score:    Total Score:      PATIENT SURVEYS:  ABC scale: The Activities-Specific Balance Confidence (ABC) Scale 0% 10 20 30  40 50 60 70 80 90  100% No confidence<->completely confident  How confident are you that you will not lose your balance or become unsteady when you . . .   Date tested 04/08/2024   Walk around the house 100%  2. Walk up or down stairs 100%  3. Bend over and pick up a slipper from in front of a closet floor 100%  4. Reach for a small can off a shelf at eye level 100%  5. Stand on tip toes and reach for something above your head 50%  6. Stand on a chair and reach for something 20%  7. Sweep the floor 100%  8. Walk outside the house to a car parked in the driveway 100%  9. Get into or out of a car 100%  10. Walk across a parking lot to the mall 100%  11. Walk up or down a ramp 90%  12. Walk in a crowded mall where people rapidly walk past you 100%  13. Are bumped into by people as you walk through the mall 100%  14. Step onto or off of an escalator while you are holding onto the railing 65%  15. Step onto or off an escalator while holding onto parcels such that you cannot hold onto the railing 50%  16.  Walk outside on icy sidewalks 20%  Total: #/16  80.94%                                                                                                                                 TREATMENT DATE: 04/08/2024  - gait with 3# AW 2x 376ft with SPC. Pt noted to have increased SOB, so rest break and/or hydration provided after each bout.   Standing at [facility] of mat table; light UE support on bed   - side stepping R and L with 3# AW along length of mat table 2x 5 bil   - reciprocal stepping over SPC in floor with 3# AW x 10  - reciprocal stpeping over hurdle x 10 bil, attempted to perform with 3# AW, but unable to return to starting position without hitting obstacle.  - side stepping over hurdle without resistance x 10 bil with light UE support on mat   - gait without resistance or AD x 356ft.   - Forward/reverse 24ft each x 6 with SPC no resistance. 1 mild LOB, but able to correct with stepping strategy to  widen BOS.   Throughout sression, PT provided CGA onless otherwise noted for safety and multiple therapeutic rest breaks due to fatigue.      PATIENT EDUCATION: Education details: Educated in Insurance Account Manager program, timing of therapy session, goals and impairments Person educated: Patient and Caregiver   Education method: Explanation, Demonstration, and Handouts Education comprehension: verbalized understanding and returned demonstration  HOME EXERCISE PROGRAM: Access Code: [email or web address] URL: [email or web address] Date: 03/11/2024 Prepared by: Massie Dollar  Exercises - Standing March with Counter Support  - 1 x daily - 7 x weekly - 3 sets - 10 reps - 3 second hold - Heel Toe Raises with Counter Support  - 1 x daily - 7 x weekly - 3 sets - 10 reps - 3 second hold  GOALS: Goals reviewed with patient? No   SHORT TERM GOALS: Target date: 04/08/2024  Patient will be independent in home exercise program to improve strength/mobility for better functional independence with ADLs. Baseline: Goal status: INITIAL   LONG TERM GOALS: Target date: 06/03/2024   Patient will increase ABC scale score >90% to demonstrate better functional mobility and better confidence with ADLs.  Baseline: 80.9375% Goal status: INITIAL  2.  Patient (> 78 years old) will complete five times sit to stand test in < 11 seconds indicating an increased LE strength and improved balance. Baseline: 12.56s Goal status: INITIAL  3.  Patient will increase Berg Balance score by > 6 points to demonstrate decreased fall risk during functional activities Baseline: 41/56 Goal status: INITIAL  4.  Patient will increase 10 meter walk test to >1.83m/s as to improve gait speed for better community ambulation and to reduce fall risk. Baseline: 0.78 m/s Goal status: INITIAL  5.  Patient will reduce timed up and go to <13 seconds to reduce fall risk and  demonstrate improved transfer/gait ability. Baseline:  16.105s Goal status: INITIAL  6. Patient will improve by 121ft in order to improve ambulation distance and increase endurance for community ambulation. Baseline: next session* Goal status: INITIAL  ASSESSMENT:  CLINICAL IMPRESSION: Pt continues to arrive motivated and ready for activity. Moderate SOB noted following weighted gait activities and intermittent rest breaks due to need for hydration. Difficultly noted with full step height to navigate obstacles with AW in place, but able to clear obstacles forward/reverse and laterally without resistance in place. Required CGA for safety or light UE support throughout session for movement in all directions.  Pt will continue to benefit from skilled therapy to address remaining deficits in order to improve overall QoL and return to PLOF.    OBJECTIVE IMPAIRMENTS: Abnormal gait, decreased activity tolerance, decreased balance, decreased cognition, decreased endurance, decreased knowledge of use of DME, decreased mobility, difficulty walking, impaired UE functional use, improper body mechanics, and postural dysfunction.   ACTIVITY LIMITATIONS: carrying, lifting, stairs, reach over head, and hygiene/grooming  PARTICIPATION LIMITATIONS: medication management, community activity, and yard work  PERSONAL FACTORS: Age, Past/current experiences, Time since onset of injury/illness/exacerbation, and 3+ comorbidities: hx of cancer, mild dementia associated w/ alcoholism, chronic atrial fibrillation are also affecting patient's functional outcome.   REHAB POTENTIAL: Good  CLINICAL DECISION MAKING: Evolving/moderate complexity  EVALUATION COMPLEXITY: Moderate  PLAN:  PT FREQUENCY: 2x/week  PT DURATION: 12 weeks  PLANNED INTERVENTIONS: 97164- PT Re-evaluation, 97750- Physical Performance Testing, 97110-Therapeutic exercises, 97530- Therapeutic activity, 97112- Neuromuscular re-education, 97535- Self Care, [id number]- Manual therapy, [phone]- Gait training,  [phone]- Orthotic Initial, [phone]- Orthotic/Prosthetic subsequent, [phone]- Aquatic Therapy, [phone]- Electrical stimulation (unattended), [phone] (1-2 muscles), [id number] (3+ muscles)- Dry Needling, Patient/Family education, Balance training, Stair training, Taping, Joint mobilization, Joint manipulation, Spinal mobilization, Vestibular training, DME instructions, Wheelchair mobility training, Cryotherapy, and Moist heat  PLAN FOR NEXT SESSION:  -dynamic balance and ambulation training -SLS activities -reemphasize HEP exercises and importance -work on proper use and mechanics of SPC    Massie Dollar PT, DPT  Physical Therapist - [place]  [facility]  3:34 PM 04/08/2024      "

## 2024-04-17 ENCOUNTER — Ambulatory Visit: Admitting: Physical Therapy

## 2024-04-17 DIAGNOSIS — R293 Abnormal posture: Secondary | ICD-10-CM | POA: Diagnosis not present

## 2024-04-17 DIAGNOSIS — R296 Repeated falls: Secondary | ICD-10-CM

## 2024-04-17 DIAGNOSIS — R2681 Unsteadiness on feet: Secondary | ICD-10-CM

## 2024-04-17 DIAGNOSIS — M6281 Muscle weakness (generalized): Secondary | ICD-10-CM

## 2024-04-17 DIAGNOSIS — R269 Unspecified abnormalities of gait and mobility: Secondary | ICD-10-CM

## 2024-04-17 DIAGNOSIS — Z9181 History of falling: Secondary | ICD-10-CM

## 2024-04-17 NOTE — Therapy (Signed)
 " OUTPATIENT PHYSICAL THERAPY TREATMENT SESSION   Patient Name: Kenneth Luna. MRN: [id number] DOB:1932/12/23, 89 y.o., male Today's Date: 04/17/2024   PCP: Alla Amis, MD  REFERRING PROVIDER: Alla Amis, MD   END OF SESSION:  PT End of Session - 04/17/24 0936     Visit Number 8    Number of Visits 24    Date for Recertification  06/03/24    Progress Note Due on Visit 10    PT Start Time 0935    PT Stop Time 1015    PT Time Calculation (min) 40 min    Equipment Utilized During Treatment Gait belt    Activity Tolerance Patient tolerated treatment well    Behavior During Therapy [facility] for tasks assessed/performed           Past Medical History:  Diagnosis Date   Allergic rhinitis    Anemia    B12 deficiency 06/29/2015   Benign neoplasm of ascending colon    Benign neoplasm of descending colon    Colon polyp    Colostomy in place [facility])    DD (diverticular disease) 06/29/2015   Dementia (HCC)    memory issues   Fatty liver    GERD (gastroesophageal reflux disease)    Glaucoma    Hyperlipidemia    Hypertension    Rectal adenocarcinoma (HCC) 07/15/2015   Substance abuse (HCC)    alcohol   Traumatic intraparenchymal hemorrhage (HCC)    Past Surgical History:  Procedure Laterality Date   ABDOMINOPERINEAL PROCTOCOLECTOMY  12/17/2015   [facility]   CATARACT EXTRACTION W/ INTRAOCULAR LENS IMPLANT     COLONOSCOPY WITH PROPOFOL  N/A 07/10/2015   Procedure: COLONOSCOPY WITH PROPOFOL ;  Surgeon: Rogelia Copping, MD;  Location: [facility];  Service: Endoscopy;  Laterality: N/A;  PER CAREGIVER WAS TOLD THAT PT WOULD BE 1ST (KEEP PT EARLY AM)   COLONOSCOPY WITH PROPOFOL  N/A 04/24/2017   Procedure: COLONOSCOPY WITH PROPOFOL ;  Surgeon: Copping Rogelia, MD;  Location: [facility];  Service: Endoscopy;  Laterality: N/A;   INSERT / REPLACE / REMOVE PACEMAKER  01/08/2020   PACEMAKER INSERTION Left 01/07/2020   Procedure: INSERTION PACEMAKER;  Surgeon:  Ammon Blunt, MD;  Location: [facility] ORS;  Service: Cardiovascular;  Laterality: Left;   PACEMAKER LEADLESS INSERTION N/A 01/07/2020   Procedure: PACEMAKER LEADLESS INSERTION;  Surgeon: Ammon Blunt, MD;  Location: [facility] INVASIVE CV LAB;  Service: Cardiovascular;  Laterality: N/A;   PERIPHERAL VASCULAR BALLOON ANGIOPLASTY N/A 01/07/2020   Procedure: PERIPHERAL VASCULAR BALLOON ANGIOPLASTY;  Surgeon: Marea Selinda RAMAN, MD;  Location: [facility] INVASIVE CV LAB;  Service: Cardiovascular;  Laterality: N/A;   PERIPHERAL VASCULAR BALLOON ANGIOPLASTY N/A 01/07/2020   Procedure: PERIPHERAL VASCULAR BALLOON ANGIOPLASTY;  Surgeon: Jama Cordella MATSU, MD;  Location: [facility] INVASIVE CV LAB;  Service: Cardiovascular;  Laterality: N/A;   PERIPHERAL VASCULAR THROMBECTOMY Right 01/20/2020   Procedure: PERIPHERAL VASCULAR THROMBECTOMY;  Surgeon: Marea Selinda RAMAN, MD;  Location: [facility] INVASIVE CV LAB;  Service: Cardiovascular;  Laterality: Right;   POLYPECTOMY  07/10/2015   Procedure: POLYPECTOMY;  Surgeon: Rogelia Copping, MD;  Location: [facility];  Service: Endoscopy;;   Patient Active Problem List   Diagnosis Date Noted   Chronic right shoulder pain 04/29/2020   Colostomy in place [facility]) 04/29/2020   Acute deep vein thrombosis (DVT) of femoral vein (HCC) 01/17/2020   DVT (deep venous thrombosis) (HCC) 01/14/2020   Sick sinus syndrome (HCC) 01/07/2020   Cardiac syncope 12/25/2019   Persistent atrial fibrillation (HCC) 11/15/2019  GERD without esophagitis 04/25/2018   Parastomal hernia without obstruction or gangrene 06/19/2017   Personal history of colon cancer    Benign neoplasm of ascending colon    Allergic rhinitis 01/30/2017   Encounter for general adult medical examination without abnormal findings 10/03/2016   Vaccine counseling 10/03/2016   Palmar plantar erythrodysesthesia 04/17/2016   Iron deficiency anemia 03/07/2016   Complete tear of right rotator cuff 11/16/2015   Rotator cuff tendinitis,  right 11/16/2015   Hyponatremia 10/09/2015   Rash of face 10/09/2015   Intracranial hemorrhage (HCC) 08/18/2015   Low back pain 08/18/2015   T12 compression fracture (HCC) 08/10/2015   Acute diverticulitis 08/08/2015   Generalized weakness 08/08/2015   Alcohol dependence (HCC) 08/08/2015   Diverticulitis 08/08/2015   Intraparenchymal hemorrhage of brain (HCC) 07/24/2015   Rectal adenocarcinoma (HCC) 07/15/2015   B12 deficiency 06/29/2015   Dementia (HCC) 06/29/2015   DD (diverticular disease) 06/29/2015   Essential (primary) hypertension 06/29/2015   GI bleed 06/23/2015    ONSET DATE: over the past couple years   REFERRING DIAG:  Z91.81 (ICD-10-CM) - At high risk for falls    THERAPY DIAG:  Abnormal posture  Abnormality of gait  History of falling  Muscle weakness (generalized)  Repeated falls  Unsteadiness on feet  Rationale for Evaluation and Treatment: Rehabilitation  SUBJECTIVE:                                                                                                                                                                                             SUBJECTIVE STATEMENT:   Pt reports that he is doing well. No pain this AM. Was able to celebrate Christmas with caregiver. Unable to see sister this year because she was not doing well over the holiday. Will need to leave a few minutes early for a Vet appointment.    Pt accompanied by: caregiver, answers most questions   PERTINENT HISTORY:  From recent routine medica visit on 03/05/2024: Kenneth Luna. is a 89 y.o. here for preventative health exam and subsequent medicare wellness  Preventative health exam: Still has moments of weakness and instability and would like to follow-up with physical therapy. States that physical therapy never contacted them. Chronic medical issues stable and tolerating medications without adverse effects. Still receiving 24-hour care and supervision. Still cutting  down alcohol to 1 bottle of wine per week and doing well with this. Exercises regularly and tries to adhere to healthy diet. No exertional cp or syncopal episodes. No urinary issues or rectal pain/bleeding. Denies any tobacco use.  ROS Review of systems is unremarkable for any active cardiac,  respiratory, GI, GU, hematologic, neurologic, dermatologic, HEENT, or psychiatric symptoms except as noted above. No fevers, chills, or constitutional symptoms.  PAIN:  Are you having pain? No  PRECAUTIONS: None  RED FLAGS: None   WEIGHT BEARING RESTRICTIONS: No  FALLS: Has patient fallen in last 6 months? Yes. Number of falls 2 and tripped over the carpet to floor transition  LIVING ENVIRONMENT: Lives with: lives in an assisted living facility, has 24/7 care available Lives in: House/apartment Stairs: No Has following equipment at home: Single point cane and Walker - 2 wheeled  PLOF: Independent  PATIENT GOALS: Getting stronger and preventing falls.  OBJECTIVE:  Note: Objective measures were completed at Evaluation unless otherwise noted.  COGNITION: Overall cognitive status: History of cognitive impairments - at baseline and mild dementia, caregiver answers most questions.   SENSATION: WFL Light touch: WFL B LE  COORDINATION: RAMPS: WFL B H<>S: WFL B  POSTURE: rounded shoulders, forward head, increased thoracic kyphosis, and flexed trunk   LOWER EXTREMITY MMT:    MMT Right Eval Left Eval  Hip flexion 4+ 4+  Hip extension    Hip abduction 5 5  Hip adduction 5 5  Hip internal rotation    Hip external rotation    Knee flexion 4+ 4+  Knee extension 5 5  Ankle dorsiflexion 4+ 4+  Ankle plantarflexion    Ankle inversion    Ankle eversion    (Blank rows = not tested)   TRANSFERS: Sit to stand: Complete Independence  Assistive device utilized: Single point cane     Stand to sit: Complete Independence  Assistive device utilized: Single point cane      STAIRS: Not  tested GAIT: Findings: Gait Characteristics: decreased step length- Right, decreased step length- Left, decreased stance time- Right, decreased stance time- Left, decreased stride length, decreased trunk rotation, trunk flexed, and wide BOS, Distance walked: distance into and out of gym, distance of functional outcome measures performed this session, Assistive device utilized:Single point cane, Level of assistance: CGA, and Comments: ambulates with SPC improperly  FUNCTIONAL TESTS:  -Pt performed 5 time sit<>stand (5xSTS): 12.56 sec (>15 sec indicates increased fall risk)   -PT instructed pt in TUG: 16.105 sec (average of 3 trials; >13.5 sec indicates increased fall risk)  (trial 1: 15.96s, trial 2: 16.25s)  -6 minute walk test: 6 Min Walk Test:  Instructed patient to ambulate as quickly and as safely as possible for 6 minutes using LRAD. Patient was allowed to take standing rest breaks without stopping the test, but if the patient required a sitting rest break the clock would be stopped and the test would be over.  Results: 825 feet (251.46 meters, Avg speed 0.22m/s) using a SPC with CGA. Results indicate that the patient has reduced endurance with ambulation compared to age matched norms.  Age Matched Norms: 17-69 yo M: 57 F: 81, 15-79 yo M: 90 F: 471, 70-89 yo M: 417 F: 392 MDC: 58.21 meters (190.98 feet) or 50 meters (ANPTA Core Set of Outcome Measures for Adults with Neurologic Conditions, 2018)   -10 Meter Walk Test: Patient instructed to walk 10 meters (32.8 ft) as quickly and as safely as possible at their normal speed x2 and at a fast speed x2. Time measured from 2 meter mark to 8 meter mark to accommodate ramp-up and ramp-down.  Normal speed 1: 0.79 m/s, 12.63s (limited community ambulator) Normal speed 2: 0.77 m/s, 12.98s (limited community ambulator)  Cut off scores: <0.4 m/s = household Ambulator, 0.4-0.8  m/s = limited community Ambulator, >0.8 m/s = community Ambulator, >1.2 m/s  = crossing a street, <1.0 = increased fall risk MCID 0.05 m/s (small), 0.13 m/s (moderate), 0.06 m/s (significant)  (ANPTA Core Set of Outcome Measures for Adults with Neurologic Conditions, 2018)   -Berg Balance Scale: Patient demonstrates increased fall risk as noted by score of 41/56 on Berg Balance Scale.  (<36= high risk for falls, close to 100%; 37-45 significant >80%; 46-51 moderate >50%; 52-55 lower >25%)  Item Test date: 04/17/2024  Date:  Date:   Sitting to standing 4. able to stand without using hands and stabilize independently Insert SmartPhrase OPRCBERGREEVAL Insert SmartPhrase OPRCBERGREEVAL  2. Standing unsupported 3. able to stand 2 minutes with supervision    3. Sitting with back unsupported, feet supported 4. able to sit safely and securely for 2 minutes    4. Standing to sitting 4. sits safely with minimal use of hands    5. Pivot transfer  4. able to transfer safely with minor use of hands    6. Standing unsupported with eyes closed 3. able to stand 10 seconds with supervision    7. Standing unsupported with feet together 3. able to place feet together independently and stand 1 minute with supervision    8. Reaching forward with outstretched arms while standing 3. can reach forward 12 cm (5 inches)    9. Pick up object from the floor from standing 4. able to pick up slipper safely and easily    10. Turning to look behind over left and right shoulders while standing 2. turns sideways but only maintains balance    11. Turn 360 degrees 2. able to turn 360 degrees safely but slowly    12. Place alternate foot on step or stool while standing unsupported 1. able to complete > 2 steps needs minimal assist    13. Standing unsupported one foot in front 3. able to place foot ahead independently and hold 30 seconds    14. Standing on one leg 1. tries to lift leg unable to hold 3 seconds but remains standing independently.      Total Score 41/56 Total Score:    Total Score:       PATIENT SURVEYS:  ABC scale: The Activities-Specific Balance Confidence (ABC) Scale 0% 10 20 30  40 50 60 70 80 90 100% No confidence<->completely confident  How confident are you that you will not lose your balance or become unsteady when you . . .   Date tested 04/17/2024   Walk around the house 100%  2. Walk up or down stairs 100%  3. Bend over and pick up a slipper from in front of a closet floor 100%  4. Reach for a small can off a shelf at eye level 100%  5. Stand on tip toes and reach for something above your head 50%  6. Stand on a chair and reach for something 20%  7. Sweep the floor 100%  8. Walk outside the house to a car parked in the driveway 100%  9. Get into or out of a car 100%  10. Walk across a parking lot to the mall 100%  11. Walk up or down a ramp 90%  12. Walk in a crowded mall where people rapidly walk past you 100%  13. Are bumped into by people as you walk through the mall 100%  14. Step onto or off of an escalator while you are holding onto the railing 65%  15.  Step onto or off an escalator while holding onto parcels such that you cannot hold onto the railing 50%  16. Walk outside on icy sidewalks 20%  Total: #/16  80.94%                                                                                                                                 TREATMENT DATE: 04/17/2024  - gait SPC x 540ft through hall of hospital no SOB noted, but states that he is glad to sit upon completion AW 2x 375ft with SPC. Pt noted to have increased SOB, so rest break and/or hydration provided after each bout.   Weave through 8 cones with SPC x 1 and without AD x 3  Weave through 8 with side stepping between cones with SPC x 1 and without AD x 3.   Education in HEP expansion.  - Side Stepping with Counter Support -5 reps - Backward Walking with Counter Support  - 5reps - Mini Squat with Counter Support  - 10 reps - Standing Hip Extension with Counter Support - 10  reps  Standing reciprocal foot tap on 6inch step x 15bil   Throughout sression, PT provided CGA onless otherwise noted for safety and multiple therapeutic rest breaks due to fatigue.      PATIENT EDUCATION: Education details: Educated in Insurance Account Manager program, timing of therapy session, goals and impairments Person educated: Patient and Caregiver   Education method: Explanation, Demonstration, and Handouts Education comprehension: verbalized understanding and returned demonstration  HOME EXERCISE PROGRAM: Access Code: [email or web address] URL: [email or web address] Date: 04/17/2024 Prepared by: Massie Dollar  Exercises - Standing March with Counter Support  - 1 x daily - 7 x weekly - 3 sets - 10 reps - 3 second hold - Heel Toe Raises with Counter Support  - 1 x daily - 7 x weekly - 3 sets - 10 reps - 3 second hold - Side Stepping with Counter Support  - 1 x daily - 7 x weekly - 3 sets - 6 reps - Backward Walking with Counter Support  - 1 x daily - 7 x weekly - 3 sets - 6 reps - Mini Squat with Counter Support  - 1 x daily - 7 x weekly - 3 sets - 10 reps - Standing Hip Extension with Counter Support  - 1 x daily - 7 x weekly - 3 sets - 10 reps  GOALS: Goals reviewed with patient? No   SHORT TERM GOALS: Target date: 04/08/2024  Patient will be independent in home exercise program to improve strength/mobility for better functional independence with ADLs. Baseline: Goal status: INITIAL   LONG TERM GOALS: Target date: 06/03/2024   Patient will increase ABC scale score >90% to demonstrate better functional mobility and better confidence with ADLs.  Baseline: 80.9375% Goal status: INITIAL  2.  Patient (> 61 years old) will complete five times sit to stand test in < 11 seconds indicating  an increased LE strength and improved balance. Baseline: 12.56s Goal status: INITIAL  3.  Patient will increase Berg Balance score by > 6 points to demonstrate decreased fall risk  during functional activities Baseline: 41/56 Goal status: INITIAL  4.  Patient will increase 10 meter walk test to >1.36m/s as to improve gait speed for better community ambulation and to reduce fall risk. Baseline: 0.78 m/s Goal status: INITIAL  5.  Patient will reduce timed up and go to <13 seconds to reduce fall risk and demonstrate improved transfer/gait ability. Baseline: 16.105s Goal status: INITIAL  6. Patient will improve by 110ft in order to improve ambulation distance and increase endurance for community ambulation. Baseline: next session* Goal status: INITIAL  ASSESSMENT:  CLINICAL IMPRESSION: Pt continues to arrive motivated and ready for activity. Increased dynamic mobility with reduced UE support to weave through cones in various planes of movement. Pt's care giver voicing that she would like to receive additional PT treatments as the patient is moving better and with more confidence at home. Education on need for insurance to approve additional coverage benefits. HEP expanded as pt is only scheduled for 1x per week for next 3 weeks given availability. Tolerated HEP expansion well without complaints of pain or SOB. Pt will continue to benefit from skilled therapy to address remaining deficits in order to improve overall QoL and return to PLOF.    OBJECTIVE IMPAIRMENTS: Abnormal gait, decreased activity tolerance, decreased balance, decreased cognition, decreased endurance, decreased knowledge of use of DME, decreased mobility, difficulty walking, impaired UE functional use, improper body mechanics, and postural dysfunction.   ACTIVITY LIMITATIONS: carrying, lifting, stairs, reach over head, and hygiene/grooming  PARTICIPATION LIMITATIONS: medication management, community activity, and yard work  PERSONAL FACTORS: Age, Past/current experiences, Time since onset of injury/illness/exacerbation, and 3+ comorbidities: hx of cancer, mild dementia associated w/ alcoholism,  chronic atrial fibrillation are also affecting patient's functional outcome.   REHAB POTENTIAL: Good  CLINICAL DECISION MAKING: Evolving/moderate complexity  EVALUATION COMPLEXITY: Moderate  PLAN:  PT FREQUENCY: 2x/week  PT DURATION: 12 weeks  PLANNED INTERVENTIONS: 97164- PT Re-evaluation, 97750- Physical Performance Testing, 97110-Therapeutic exercises, 97530- Therapeutic activity, 97112- Neuromuscular re-education, 97535- Self Care, [id number]- Manual therapy, [phone]- Gait training, [phone]- Orthotic Initial, [phone]- Orthotic/Prosthetic subsequent, [phone]- Aquatic Therapy, [phone]- Electrical stimulation (unattended), [phone] (1-2 muscles), [phone] (3+ muscles)- Dry Needling, Patient/Family education, Balance training, Stair training, Taping, Joint mobilization, Joint manipulation, Spinal mobilization, Vestibular training, DME instructions, Wheelchair mobility training, Cryotherapy, and Moist heat  PLAN FOR NEXT SESSION:  -dynamic balance and ambulation training -SLS activities -reemphasize HEP exercises and importance -work on proper use and mechanics of SPC    Massie Dollar PT, DPT  Physical Therapist - [place]  [facility]  9:37 AM 04/17/2024      "

## 2024-04-24 ENCOUNTER — Ambulatory Visit: Attending: Family Medicine | Admitting: Physical Therapy

## 2024-04-24 DIAGNOSIS — M6281 Muscle weakness (generalized): Secondary | ICD-10-CM | POA: Insufficient documentation

## 2024-04-24 DIAGNOSIS — R2681 Unsteadiness on feet: Secondary | ICD-10-CM | POA: Insufficient documentation

## 2024-04-24 DIAGNOSIS — Z9181 History of falling: Secondary | ICD-10-CM | POA: Insufficient documentation

## 2024-04-24 DIAGNOSIS — R293 Abnormal posture: Secondary | ICD-10-CM | POA: Insufficient documentation

## 2024-04-24 DIAGNOSIS — R269 Unspecified abnormalities of gait and mobility: Secondary | ICD-10-CM | POA: Insufficient documentation

## 2024-04-24 DIAGNOSIS — R296 Repeated falls: Secondary | ICD-10-CM | POA: Insufficient documentation

## 2024-04-24 NOTE — Therapy (Signed)
 " OUTPATIENT PHYSICAL THERAPY TREATMENT SESSION   Patient Name: Kenneth Luna. MRN: [id number] DOB:[birth year], 89 y.o., male Today's Date: 04/24/2024   PCP: Alla Amis, MD  REFERRING PROVIDER: Alla Amis, MD   END OF SESSION:  PT End of Session - 04/24/24 1021     Visit Number 9    Number of Visits 24    Date for Recertification  06/03/24    Progress Note Due on Visit 10    PT Start Time 1020    PT Stop Time 1100    PT Time Calculation (min) 40 min    Equipment Utilized During Treatment Gait belt    Activity Tolerance Patient tolerated treatment well    Behavior During Therapy [place] for tasks assessed/performed           Past Medical History:  Diagnosis Date   Allergic rhinitis    Anemia    B12 deficiency 06/29/2015   Benign neoplasm of ascending colon    Benign neoplasm of descending colon    Colon polyp    Colostomy in place [facility])    DD (diverticular disease) 06/29/2015   Dementia (HCC)    memory issues   Fatty liver    GERD (gastroesophageal reflux disease)    Glaucoma    Hyperlipidemia    Hypertension    Rectal adenocarcinoma (HCC) 07/15/2015   Substance abuse (HCC)    alcohol   Traumatic intraparenchymal hemorrhage (HCC)    Past Surgical History:  Procedure Laterality Date   ABDOMINOPERINEAL PROCTOCOLECTOMY  12/17/2015   [facility]   CATARACT EXTRACTION W/ INTRAOCULAR LENS IMPLANT     COLONOSCOPY WITH PROPOFOL  N/A 07/10/2015   Procedure: COLONOSCOPY WITH PROPOFOL ;  Surgeon: Rogelia Copping, MD;  Location: [facility];  Service: Endoscopy;  Laterality: N/A;  PER CAREGIVER WAS TOLD THAT PT WOULD BE 1ST (KEEP PT EARLY AM)   COLONOSCOPY WITH PROPOFOL  N/A 04/24/2017   Procedure: COLONOSCOPY WITH PROPOFOL ;  Surgeon: Copping Rogelia, MD;  Location: [facility];  Service: Endoscopy;  Laterality: N/A;   INSERT / REPLACE / REMOVE PACEMAKER  01/08/2020   PACEMAKER INSERTION Left 01/07/2020   Procedure: INSERTION PACEMAKER;  Surgeon: Ammon Blunt, MD;  Location: [facility] ORS;  Service: Cardiovascular;  Laterality: Left;   PACEMAKER LEADLESS INSERTION N/A 01/07/2020   Procedure: PACEMAKER LEADLESS INSERTION;  Surgeon: Ammon Blunt, MD;  Location: [facility] INVASIVE CV LAB;  Service: Cardiovascular;  Laterality: N/A;   PERIPHERAL VASCULAR BALLOON ANGIOPLASTY N/A 01/07/2020   Procedure: PERIPHERAL VASCULAR BALLOON ANGIOPLASTY;  Surgeon: Marea Selinda RAMAN, MD;  Location: [facility] INVASIVE CV LAB;  Service: Cardiovascular;  Laterality: N/A;   PERIPHERAL VASCULAR BALLOON ANGIOPLASTY N/A 01/07/2020   Procedure: PERIPHERAL VASCULAR BALLOON ANGIOPLASTY;  Surgeon: Jama Cordella MATSU, MD;  Location: [facility] INVASIVE CV LAB;  Service: Cardiovascular;  Laterality: N/A;   PERIPHERAL VASCULAR THROMBECTOMY Right 01/20/2020   Procedure: PERIPHERAL VASCULAR THROMBECTOMY;  Surgeon: Marea Selinda RAMAN, MD;  Location: [facility] INVASIVE CV LAB;  Service: Cardiovascular;  Laterality: Right;   POLYPECTOMY  07/10/2015   Procedure: POLYPECTOMY;  Surgeon: Rogelia Copping, MD;  Location: [facility];  Service: Endoscopy;;   Patient Active Problem List   Diagnosis Date Noted   Chronic right shoulder pain 04/29/2020   Colostomy in place [facility]) 04/29/2020   Acute deep vein thrombosis (DVT) of femoral vein (HCC) 01/17/2020   DVT (deep venous thrombosis) (HCC) 01/14/2020   Sick sinus syndrome (HCC) 01/07/2020   Cardiac syncope 12/25/2019   Persistent atrial fibrillation (HCC) 11/15/2019  GERD without esophagitis 04/25/2018   Parastomal hernia without obstruction or gangrene 06/19/2017   Personal history of colon cancer    Benign neoplasm of ascending colon    Allergic rhinitis 01/30/2017   Encounter for general adult medical examination without abnormal findings 10/03/2016   Vaccine counseling 10/03/2016   Palmar plantar erythrodysesthesia 04/17/2016   Iron deficiency anemia 03/07/2016   Complete tear of right rotator cuff 11/16/2015   Rotator cuff tendinitis, right  11/16/2015   Hyponatremia 10/09/2015   Rash of face 10/09/2015   Intracranial hemorrhage (HCC) 08/18/2015   Low back pain 08/18/2015   T12 compression fracture (HCC) 08/10/2015   Acute diverticulitis 08/08/2015   Generalized weakness 08/08/2015   Alcohol dependence (HCC) 08/08/2015   Diverticulitis 08/08/2015   Intraparenchymal hemorrhage of brain (HCC) 07/24/2015   Rectal adenocarcinoma (HCC) 07/15/2015   B12 deficiency 06/29/2015   Dementia (HCC) 06/29/2015   DD (diverticular disease) 06/29/2015   Essential (primary) hypertension 06/29/2015   GI bleed 06/23/2015    ONSET DATE: over the past couple years   REFERRING DIAG:  Z91.81 (ICD-10-CM) - At high risk for falls    THERAPY DIAG:  Abnormal posture  Abnormality of gait  History of falling  Muscle weakness (generalized)  Repeated falls  Unsteadiness on feet  Rationale for Evaluation and Treatment: Rehabilitation  SUBJECTIVE:                                                                                                                                                                                             SUBJECTIVE STATEMENT:   Pt reports that he is doing well. No pain this AM. Caregiver reports that he is doing HEP regularly with entire care team. No new complaints.   Pt accompanied by: caregiver, answers most questions   PERTINENT HISTORY:  From recent routine medica visit on 03/05/2024: Kenneth Luna. is a 89 y.o. here for preventative health exam and subsequent medicare wellness  Preventative health exam: Still has moments of weakness and instability and would like to follow-up with physical therapy. States that physical therapy never contacted them. Chronic medical issues stable and tolerating medications without adverse effects. Still receiving 24-hour care and supervision. Still cutting down alcohol to 1 bottle of wine per week and doing well with this. Exercises regularly and tries to  adhere to healthy diet. No exertional cp or syncopal episodes. No urinary issues or rectal pain/bleeding. Denies any tobacco use.  ROS Review of systems is unremarkable for any active cardiac, respiratory, GI, GU, hematologic, neurologic, dermatologic, HEENT, or psychiatric symptoms except as noted above. No fevers, chills, or constitutional symptoms.  PAIN:  Are you having pain? No  PRECAUTIONS: None  RED FLAGS: None   WEIGHT BEARING RESTRICTIONS: No  FALLS: Has patient fallen in last 6 months? Yes. Number of falls 2 and tripped over the carpet to floor transition  LIVING ENVIRONMENT: Lives with: lives in an assisted living facility, has 24/7 care available Lives in: House/apartment Stairs: No Has following equipment at home: Single point cane and Walker - 2 wheeled  PLOF: Independent  PATIENT GOALS: Getting stronger and preventing falls.  OBJECTIVE:  Note: Objective measures were completed at Evaluation unless otherwise noted.  COGNITION: Overall cognitive status: History of cognitive impairments - at baseline and mild dementia, caregiver answers most questions.   SENSATION: WFL Light touch: WFL B LE  COORDINATION: RAMPS: WFL B H<>S: WFL B  POSTURE: rounded shoulders, forward head, increased thoracic kyphosis, and flexed trunk   LOWER EXTREMITY MMT:    MMT Right Eval Left Eval  Hip flexion 4+ 4+  Hip extension    Hip abduction 5 5  Hip adduction 5 5  Hip internal rotation    Hip external rotation    Knee flexion 4+ 4+  Knee extension 5 5  Ankle dorsiflexion 4+ 4+  Ankle plantarflexion    Ankle inversion    Ankle eversion    (Blank rows = not tested)   TRANSFERS: Sit to stand: Complete Independence  Assistive device utilized: Single point cane     Stand to sit: Complete Independence  Assistive device utilized: Single point cane      STAIRS: Not tested GAIT: Findings: Gait Characteristics: decreased step length- Right, decreased step length- Left,  decreased stance time- Right, decreased stance time- Left, decreased stride length, decreased trunk rotation, trunk flexed, and wide BOS, Distance walked: distance into and out of gym, distance of functional outcome measures performed this session, Assistive device utilized:Single point cane, Level of assistance: CGA, and Comments: ambulates with SPC improperly  FUNCTIONAL TESTS:  -Pt performed 5 time sit<>stand (5xSTS): 12.56 sec (>15 sec indicates increased fall risk)   -PT instructed pt in TUG: 16.105 sec (average of 3 trials; >13.5 sec indicates increased fall risk)  (trial 1: 15.96s, trial 2: 16.25s)  -6 minute walk test: 6 Min Walk Test:  Instructed patient to ambulate as quickly and as safely as possible for 6 minutes using LRAD. Patient was allowed to take standing rest breaks without stopping the test, but if the patient required a sitting rest break the clock would be stopped and the test would be over.  Results: 825 feet (251.46 meters, Avg speed 0.34m/s) using a SPC with CGA. Results indicate that the patient has reduced endurance with ambulation compared to age matched norms.  Age Matched Norms: 30-69 yo M: 3 F: 29, 67-79 yo M: 32 F: 471, 69-89 yo M: 417 F: 392 MDC: 58.21 meters (190.98 feet) or 50 meters (ANPTA Core Set of Outcome Measures for Adults with Neurologic Conditions, 2018)   -10 Meter Walk Test: Patient instructed to walk 10 meters (32.8 ft) as quickly and as safely as possible at their normal speed x2 and at a fast speed x2. Time measured from 2 meter mark to 8 meter mark to accommodate ramp-up and ramp-down.  Normal speed 1: 0.79 m/s, 12.63s (limited community ambulator) Normal speed 2: 0.77 m/s, 12.98s (limited community ambulator)  Cut off scores: <0.4 m/s = household Ambulator, 0.4-0.8 m/s = limited community Ambulator, >0.8 m/s = community Ambulator, >1.2 m/s = crossing a street, <1.0 = increased fall risk  MCID 0.05 m/s (small), 0.13 m/s (moderate), 0.06 m/s  (significant)  (ANPTA Core Set of Outcome Measures for Adults with Neurologic Conditions, 2018)   -Berg Balance Scale: Patient demonstrates increased fall risk as noted by score of 41/56 on Berg Balance Scale.  (<36= high risk for falls, close to 100%; 37-45 significant >80%; 46-51 moderate >50%; 52-55 lower >25%)  Item Test date: 04/24/2024  Date:  Date:   Sitting to standing 4. able to stand without using hands and stabilize independently Insert SmartPhrase OPRCBERGREEVAL Insert SmartPhrase OPRCBERGREEVAL  2. Standing unsupported 3. able to stand 2 minutes with supervision    3. Sitting with back unsupported, feet supported 4. able to sit safely and securely for 2 minutes    4. Standing to sitting 4. sits safely with minimal use of hands    5. Pivot transfer  4. able to transfer safely with minor use of hands    6. Standing unsupported with eyes closed 3. able to stand 10 seconds with supervision    7. Standing unsupported with feet together 3. able to place feet together independently and stand 1 minute with supervision    8. Reaching forward with outstretched arms while standing 3. can reach forward 12 cm (5 inches)    9. Pick up object from the floor from standing 4. able to pick up slipper safely and easily    10. Turning to look behind over left and right shoulders while standing 2. turns sideways but only maintains balance    11. Turn 360 degrees 2. able to turn 360 degrees safely but slowly    12. Place alternate foot on step or stool while standing unsupported 1. able to complete > 2 steps needs minimal assist    13. Standing unsupported one foot in front 3. able to place foot ahead independently and hold 30 seconds    14. Standing on one leg 1. tries to lift leg unable to hold 3 seconds but remains standing independently.      Total Score 41/56 Total Score:    Total Score:      PATIENT SURVEYS:  ABC scale: The Activities-Specific Balance Confidence (ABC) Scale 0% 10 20 30  40 50 60  70 80 90 100% No confidence<->completely confident  How confident are you that you will not lose your balance or become unsteady when you . . .   Date tested 04/24/2024   Walk around the house 100%  2. Walk up or down stairs 100%  3. Bend over and pick up a slipper from in front of a closet floor 100%  4. Reach for a small can off a shelf at eye level 100%  5. Stand on tip toes and reach for something above your head 50%  6. Stand on a chair and reach for something 20%  7. Sweep the floor 100%  8. Walk outside the house to a car parked in the driveway 100%  9. Get into or out of a car 100%  10. Walk across a parking lot to the mall 100%  11. Walk up or down a ramp 90%  12. Walk in a crowded mall where people rapidly walk past you 100%  13. Are bumped into by people as you walk through the mall 100%  14. Step onto or off of an escalator while you are holding onto the railing 65%  15. Step onto or off an escalator while holding onto parcels such that you cannot hold onto the railing 50%  16.  Walk outside on icy sidewalks 20%  Total: #/16  80.94%                                                                                                                                 TREATMENT DATE: 04/24/2024  Nustep level 1-4 x 5 min. Cues from PT for full ROM. Required prolonged therapeutic rest break upon completion as well as hydration.   Sit<>stand with 3KG ball. 3 x 8   Side stepping up/down 4 steps laterally x 2 bil   Gait without AD x 374ft. Cues for posture from PT but no LOB throughout. Hydration required upon completion   Stepping over 2 canes in the floor 2 x 10 bil difficulty with weight shift onto the RLE with forward step. Improved with cues from PT on second bout  Forward/reverse gait with no AD 68ft x 5 cues for wide BOS to prevent stepping on opposite foot  Side stepping R and L without AD. 31ft x 4 bil   Throughout session PT provided CGA to min assist for safety and to  reduce fall risk.   PATIENT EDUCATION: Education details: Educated in Insurance Account Manager program, timing of therapy session, goals and impairments Person educated: Patient and Caregiver   Education method: Explanation, Demonstration, and Handouts Education comprehension: verbalized understanding and returned demonstration  HOME EXERCISE PROGRAM: Access Code: [email or web address] URL: [email or web address] Date: 04/17/2024 Prepared by: Massie Dollar  Exercises - Standing March with Counter Support  - 1 x daily - 7 x weekly - 3 sets - 10 reps - 3 second hold - Heel Toe Raises with Counter Support  - 1 x daily - 7 x weekly - 3 sets - 10 reps - 3 second hold - Side Stepping with Counter Support  - 1 x daily - 7 x weekly - 3 sets - 6 reps - Backward Walking with Counter Support  - 1 x daily - 7 x weekly - 3 sets - 6 reps - Mini Squat with Counter Support  - 1 x daily - 7 x weekly - 3 sets - 10 reps - Standing Hip Extension with Counter Support  - 1 x daily - 7 x weekly - 3 sets - 10 reps  GOALS: Goals reviewed with patient? No   SHORT TERM GOALS: Target date: 04/08/2024  Patient will be independent in home exercise program to improve strength/mobility for better functional independence with ADLs. Baseline: Goal status: INITIAL   LONG TERM GOALS: Target date: 06/03/2024   Patient will increase ABC scale score >90% to demonstrate better functional mobility and better confidence with ADLs.  Baseline: 80.9375% Goal status: INITIAL  2.  Patient (> 64 years old) will complete five times sit to stand test in < 11 seconds indicating an increased LE strength and improved balance. Baseline: 12.56s Goal status: INITIAL  3.  Patient will increase Berg Balance score by > 6 points to demonstrate decreased fall risk during  functional activities Baseline: 41/56 Goal status: INITIAL  4.  Patient will increase 10 meter walk test to >1.11m/s as to improve gait speed for better community  ambulation and to reduce fall risk. Baseline: 0.78 m/s Goal status: INITIAL  5.  Patient will reduce timed up and go to <13 seconds to reduce fall risk and demonstrate improved transfer/gait ability. Baseline: 16.105s Goal status: INITIAL  6. Patient will improve by 166ft in order to improve ambulation distance and increase endurance for community ambulation. Baseline: next session* Goal status: INITIAL  ASSESSMENT:  CLINICAL IMPRESSION: Pt continues to arrive motivated and ready for activity. Increased dynamic mobility with reduced UE for prolonged and dynamic gait. Increased focus on improved hip strength and power on this day requiring multiple seated rest and hydration breaks throughout session. Mild SOB upon completion of nustep and stair ascent.  Mils difficulty with reverse gait and weight shift over the RLE with stepping over canes in the floor.Pt will continue to benefit from skilled therapy to address remaining deficits in order to improve overall QoL and return to PLOF.    OBJECTIVE IMPAIRMENTS: Abnormal gait, decreased activity tolerance, decreased balance, decreased cognition, decreased endurance, decreased knowledge of use of DME, decreased mobility, difficulty walking, impaired UE functional use, improper body mechanics, and postural dysfunction.   ACTIVITY LIMITATIONS: carrying, lifting, stairs, reach over head, and hygiene/grooming  PARTICIPATION LIMITATIONS: medication management, community activity, and yard work  PERSONAL FACTORS: Age, Past/current experiences, Time since onset of injury/illness/exacerbation, and 3+ comorbidities: hx of cancer, mild dementia associated w/ alcoholism, chronic atrial fibrillation are also affecting patient's functional outcome.   REHAB POTENTIAL: Good  CLINICAL DECISION MAKING: Evolving/moderate complexity  EVALUATION COMPLEXITY: Moderate  PLAN:  PT FREQUENCY: 2x/week  PT DURATION: 12 weeks  PLANNED INTERVENTIONS: 97164- PT  Re-evaluation, 97750- Physical Performance Testing, 97110-Therapeutic exercises, 97530- Therapeutic activity, 97112- Neuromuscular re-education, 97535- Self Care, [id number]- Manual therapy, [phone]- Gait training, [phone]- Orthotic Initial, [phone]- Orthotic/Prosthetic subsequent, [phone]- Aquatic Therapy, [phone]- Electrical stimulation (unattended), [phone] (1-2 muscles), [phone] (3+ muscles)- Dry Needling, Patient/Family education, Balance training, Stair training, Taping, Joint mobilization, Joint manipulation, Spinal mobilization, Vestibular training, DME instructions, Wheelchair mobility training, Cryotherapy, and Moist heat  PLAN FOR NEXT SESSION:  -dynamic balance and ambulation training -SLS activities -reemphasize HEP exercises and importance  BLE strengthening to improve power production and stance on the RLE     Massie Dollar PT, DPT  Physical Therapist - [facility]  10:21 AM 04/24/2024      "

## 2024-04-29 ENCOUNTER — Ambulatory Visit: Admitting: Physical Therapy

## 2024-04-29 DIAGNOSIS — M6281 Muscle weakness (generalized): Secondary | ICD-10-CM

## 2024-04-29 DIAGNOSIS — R2681 Unsteadiness on feet: Secondary | ICD-10-CM

## 2024-04-29 DIAGNOSIS — R293 Abnormal posture: Secondary | ICD-10-CM

## 2024-04-29 DIAGNOSIS — R296 Repeated falls: Secondary | ICD-10-CM

## 2024-04-29 DIAGNOSIS — R269 Unspecified abnormalities of gait and mobility: Secondary | ICD-10-CM

## 2024-04-29 DIAGNOSIS — Z9181 History of falling: Secondary | ICD-10-CM

## 2024-04-29 NOTE — Therapy (Unsigned)
 " OUTPATIENT PHYSICAL THERAPY TREATMENT SESSION/ PHYSICAL THERAPY PROGRESS NOTE   Dates of reporting period  03/11/24   to   04/30/23    Patient Name: Kenneth Luna. MRN: [id number] DOB:[birth year], 89 y.o., male Today's Date: 04/29/2024   PCP: Kenneth Amis, MD  REFERRING PROVIDER: Alla Amis, MD   END OF SESSION:  PT End of Session - 04/29/24 1443     Visit Number 10    Number of Visits 24    Date for Recertification  06/03/24    Progress Note Due on Visit 10    PT Start Time 1405    PT Stop Time 1449    PT Time Calculation (min) 44 min    Equipment Utilized During Treatment Gait belt    Activity Tolerance Patient tolerated treatment well    Behavior During Therapy [facility] for tasks assessed/performed           Past Medical History:  Diagnosis Date   Allergic rhinitis    Anemia    B12 deficiency 06/29/2015   Benign neoplasm of ascending colon    Benign neoplasm of descending colon    Colon polyp    Colostomy in place [facility])    DD (diverticular disease) 06/29/2015   Dementia (HCC)    memory issues   Fatty liver    GERD (gastroesophageal reflux disease)    Glaucoma    Hyperlipidemia    Hypertension    Rectal adenocarcinoma (HCC) 07/15/2015   Substance abuse (HCC)    alcohol   Traumatic intraparenchymal hemorrhage (HCC)    Past Surgical History:  Procedure Laterality Date   ABDOMINOPERINEAL PROCTOCOLECTOMY  12/17/2015   [facility]   CATARACT EXTRACTION W/ INTRAOCULAR LENS IMPLANT     COLONOSCOPY WITH PROPOFOL  N/A 07/10/2015   Procedure: COLONOSCOPY WITH PROPOFOL ;  Surgeon: Kenneth Copping, MD;  Location: [facility];  Service: Endoscopy;  Laterality: N/A;  PER CAREGIVER WAS TOLD THAT PT WOULD BE 1ST (KEEP PT EARLY AM)   COLONOSCOPY WITH PROPOFOL  N/A 04/24/2017   Procedure: COLONOSCOPY WITH PROPOFOL ;  Surgeon: Luna Rogelia, MD;  Location: [facility];  Service: Endoscopy;  Laterality: N/A;   INSERT / REPLACE / REMOVE PACEMAKER  01/08/2020    PACEMAKER INSERTION Left 01/07/2020   Procedure: INSERTION PACEMAKER;  Surgeon: Kenneth Blunt, MD;  Location: [facility] ORS;  Service: Cardiovascular;  Laterality: Left;   PACEMAKER LEADLESS INSERTION N/A 01/07/2020   Procedure: PACEMAKER LEADLESS INSERTION;  Surgeon: Kenneth Blunt, MD;  Location: [facility] INVASIVE CV LAB;  Service: Cardiovascular;  Laterality: N/A;   PERIPHERAL VASCULAR BALLOON ANGIOPLASTY N/A 01/07/2020   Procedure: PERIPHERAL VASCULAR BALLOON ANGIOPLASTY;  Surgeon: Kenneth Selinda RAMAN, MD;  Location: [facility] INV[facility]IVE CV LAB;  Service: Cardiovascular;  Laterality: N/A;   PERIPHERAL VASCULAR BALLOON ANGIOPLASTY N/A 01/07/2020   Procedure: PERIPHERAL VASCULAR BALLOON ANGIOPLASTY;  Surgeon: Kenneth Cordella MATSU, MD;  Location: [facility] INVASIVE CV LAB;  Service: Cardiovascular;  Laterality: N/A;   PERIPHERAL VASCULAR THROMBECTOMY Right 01/20/2020   Procedure: PERIPHERAL VASCULAR THROMBECTOMY;  Surgeon: Kenneth Selinda RAMAN, MD;  Location: [facility] INVASIVE CV LAB;  Service: Cardiovascular;  Laterality: Right;   POLYPECTOMY  07/10/2015   Procedure: POLYPECTOMY;  Surgeon: Kenneth Copping, MD;  Location: [facility];  Service: Endoscopy;;   Patient Active Problem List   Diagnosis Date Noted   Chronic right shoulder pain 04/29/2020   Colostomy in place [facility]) 04/29/2020   Acute deep vein thrombosis (DVT) of femoral vein (HCC) 01/17/2020   DVT (deep venous thrombosis) (HCC) 01/14/2020  Sick sinus syndrome (HCC) 01/07/2020   Cardiac syncope 12/25/2019   Persistent atrial fibrillation (HCC) 11/15/2019   GERD without esophagitis 04/25/2018   Parastomal hernia without obstruction or gangrene 06/19/2017   Personal history of colon cancer    Benign neoplasm of ascending colon    Allergic rhinitis 01/30/2017   Encounter for general adult medical examination without abnormal findings 10/03/2016   Vaccine counseling 10/03/2016   Palmar plantar erythrodysesthesia 04/17/2016   Iron deficiency anemia  03/07/2016   Complete tear of right rotator cuff 11/16/2015   Rotator cuff tendinitis, right 11/16/2015   Hyponatremia 10/09/2015   Rash of face 10/09/2015   Intracranial hemorrhage (HCC) 08/18/2015   Low back pain 08/18/2015   T12 compression fracture (HCC) 08/10/2015   Acute diverticulitis 08/08/2015   Generalized weakness 08/08/2015   Alcohol dependence (HCC) 08/08/2015   Diverticulitis 08/08/2015   Intraparenchymal hemorrhage of brain (HCC) 07/24/2015   Rectal adenocarcinoma (HCC) 07/15/2015   B12 deficiency 06/29/2015   Dementia (HCC) 06/29/2015   DD (diverticular disease) 06/29/2015   Essential (primary) hypertension 06/29/2015   GI bleed 06/23/2015    ONSET DATE: over the past couple years   REFERRING DIAG:  Z91.81 (ICD-10-CM) - At high risk for falls    THERAPY DIAG:  Abnormal posture  Abnormality of gait  History of falling  Muscle weakness (generalized)  Repeated falls  Unsteadiness on feet  Rationale for Evaluation and Treatment: Rehabilitation  SUBJECTIVE:                                                                                                                                                                                             SUBJECTIVE STATEMENT:   Pt reports that he is doing well. No pain this AM.  No new complaints.   Pt accompanied by: caregiver, answers most questions   PERTINENT HISTORY:  From recent routine medica visit on 03/05/2024: Kenneth Luna. is a 89 y.o. here for preventative health exam and subsequent medicare wellness  Preventative health exam: Still has moments of weakness and instability and would like to follow-up with physical therapy. States that physical therapy never contacted them. Chronic medical issues stable and tolerating medications without adverse effects. Still receiving 24-hour care and supervision. Still cutting down alcohol to 1 bottle of wine per week and doing well with this. Exercises  regularly and tries to adhere to healthy diet. No exertional cp or syncopal episodes. No urinary issues or rectal pain/bleeding. Denies any tobacco use.  ROS Review of systems is unremarkable for any active cardiac, respiratory, GI, GU, hematologic, neurologic, dermatologic, HEENT, or psychiatric symptoms except as  noted above. No fevers, chills, or constitutional symptoms.  PAIN:  Are you having pain? No  PRECAUTIONS: None  RED FLAGS: None   WEIGHT BEARING RESTRICTIONS: No  FALLS: Has patient fallen in last 6 months? Yes. Number of falls 2 and tripped over the carpet to floor transition  LIVING ENVIRONMENT: Lives with: lives in an assisted living facility, has 24/7 care available Lives in: House/apartment Stairs: No Has following equipment at home: Single point cane and Walker - 2 wheeled  PLOF: Independent  PATIENT GOALS: Getting stronger and preventing falls.  OBJECTIVE:  Note: Objective measures were completed at Evaluation unless otherwise noted.  COGNITION: Overall cognitive status: History of cognitive impairments - at baseline and mild dementia, caregiver answers most questions.   SENSATION: WFL Light touch: WFL B LE  COORDINATION: RAMPS: WFL B H<>S: WFL B  POSTURE: rounded shoulders, forward head, increased thoracic kyphosis, and flexed trunk   LOWER EXTREMITY MMT:    MMT Right Eval Left Eval  Hip flexion 4+ 4+  Hip extension    Hip abduction 5 5  Hip adduction 5 5  Hip internal rotation    Hip external rotation    Knee flexion 4+ 4+  Knee extension 5 5  Ankle dorsiflexion 4+ 4+  Ankle plantarflexion    Ankle inversion    Ankle eversion    (Blank rows = not tested)   TRANSFERS: Sit to stand: Complete Independence  Assistive device utilized: Single point cane     Stand to sit: Complete Independence  Assistive device utilized: Single point cane      STAIRS: Not tested GAIT: Findings: Gait Characteristics: decreased step length- Right,  decreased step length- Left, decreased stance time- Right, decreased stance time- Left, decreased stride length, decreased trunk rotation, trunk flexed, and wide BOS, Distance walked: distance into and out of gym, distance of functional outcome measures performed this session, Assistive device utilized:Single point cane, Level of assistance: CGA, and Comments: ambulates with SPC improperly  FUNCTIONAL TESTS:  -Pt performed 5 time sit<>stand (5xSTS): 12.56 sec (>15 sec indicates increased fall risk)   -PT instructed pt in TUG: 16.105 sec (average of 3 trials; >13.5 sec indicates increased fall risk)  (trial 1: 15.96s, trial 2: 16.25s)  -6 minute walk test: 6 Min Walk Test:  Instructed patient to ambulate as quickly and as safely as possible for 6 minutes using LRAD. Patient was allowed to take standing rest breaks without stopping the test, but if the patient required a sitting rest break the clock would be stopped and the test would be over.  Results: 825 feet (251.46 meters, Avg speed 0.74m/s) using a SPC with CGA. Results indicate that the patient has reduced endurance with ambulation compared to age matched norms.  Age Matched Norms: 43-69 yo M: 77 F: 74, 45-79 yo M: 97 F: 471, 26-89 yo M: 417 F: 392 MDC: 58.21 meters (190.98 feet) or 50 meters (ANPTA Core Set of Outcome Measures for Adults with Neurologic Conditions, 2018)   -10 Meter Walk Test: Patient instructed to walk 10 meters (32.8 ft) as quickly and as safely as possible at their normal speed x2 and at a fast speed x2. Time measured from 2 meter mark to 8 meter mark to accommodate ramp-up and ramp-down.  Normal speed 1: 0.79 m/s, 12.63s (limited community ambulator) Normal speed 2: 0.77 m/s, 12.98s (limited community ambulator)  Cut off scores: <0.4 m/s = household Ambulator, 0.4-0.8 m/s = limited community Ambulator, >0.8 m/s = community Ambulator, >1.2 m/s =  crossing a street, <1.0 = increased fall risk MCID 0.05 m/s (small), 0.13  m/s (moderate), 0.06 m/s (significant)  (ANPTA Core Set of Outcome Measures for Adults with Neurologic Conditions, 2018)   -Berg Balance Scale: Patient demonstrates increased fall risk as noted by score of 41/56 on Berg Balance Scale.  (<36= high risk for falls, close to 100%; 37-45 significant >80%; 46-51 moderate >50%; 52-55 lower >25%)  Item Test date: 04/29/2024  Date:  Date:   Sitting to standing 4. able to stand without using hands and stabilize independently Insert SmartPhrase OPRCBERGREEVAL Insert SmartPhrase OPRCBERGREEVAL  2. Standing unsupported 3. able to stand 2 minutes with supervision    3. Sitting with back unsupported, feet supported 4. able to sit safely and securely for 2 minutes    4. Standing to sitting 4. sits safely with minimal use of hands    5. Pivot transfer  4. able to transfer safely with minor use of hands    6. Standing unsupported with eyes closed 3. able to stand 10 seconds with supervision    7. Standing unsupported with feet together 3. able to place feet together independently and stand 1 minute with supervision    8. Reaching forward with outstretched arms while standing 3. can reach forward 12 cm (5 inches)    9. Pick up object from the floor from standing 4. able to pick up slipper safely and easily    10. Turning to look behind over left and right shoulders while standing 2. turns sideways but only maintains balance    11. Turn 360 degrees 2. able to turn 360 degrees safely but slowly    12. Place alternate foot on step or stool while standing unsupported 1. able to complete > 2 steps needs minimal assist    13. Standing unsupported one foot in front 3. able to place foot ahead independently and hold 30 seconds    14. Standing on one leg 1. tries to lift leg unable to hold 3 seconds but remains standing independently.      Total Score 41/56 Total Score:    Total Score:      PATIENT SURVEYS:  ABC scale: The Activities-Specific Balance Confidence (ABC)  Scale 0% 10 20 30  40 50 60 70 80 90 100% No confidence<->completely confident  How confident are you that you will not lose your balance or become unsteady when you . . .   Date tested 04/29/2024   Walk around the house 100%  2. Walk up or down stairs 100%  3. Bend over and pick up a slipper from in front of a closet floor 100%  4. Reach for a small can off a shelf at eye level 100%  5. Stand on tip toes and reach for something above your head 50%  6. Stand on a chair and reach for something 20%  7. Sweep the floor 100%  8. Walk outside the house to a car parked in the driveway 100%  9. Get into or out of a car 100%  10. Walk across a parking lot to the mall 100%  11. Walk up or down a ramp 90%  12. Walk in a crowded mall where people rapidly walk past you 100%  13. Are bumped into by people as you walk through the mall 100%  14. Step onto or off of an escalator while you are holding onto the railing 65%  15. Step onto or off an escalator while holding onto parcels such that you  cannot hold onto the railing 50%  16. Walk outside on icy sidewalks 20%  Total: #/16  80.94%                                                                                                                                 TREATMENT DATE: 04/29/2024  Nustep level 1-4 x 6 min. Cues from PT for full ROM. Required prolonged therapeutic rest break upon completion as well as hydration.   Reciprocal stepping over 4 half bolsters x 6 laps at rail with cues for step length   Side stepping over 3 bolsters at rail x 6 laps.   Gait with SPCin hall forward/reverse 55ft to 56ft in both directions for ~ 364ft.   Gait with SPC to perform 180deg turns x 8R and 8 L for ~229ft.   Side stepping with SPC 20ft x 2 with SPC and x 3 without AD.   Gait with SPC through hal of hospital >559ft. Cues for step length and posture with fatigue.    10 Meter Walk Test: Patient instructed to walk 10 meters (32.8 ft) as quickly and  as safely as possible at their normal speed x2 and at a fast speed x2. Time measured from 2 meter mark to 8 meter mark to accommodate ramp-up and ramp-down.   Average Fast speed:  0.13m/s Cut off scores: <0.4 m/s = household Ambulator, 0.4-0.8 m/s = limited community Ambulator, >0.8 m/s = community Ambulator, >1.2 m/s = crossing a street, <1.0 = increased fall risk MCID 0.05 m/s (small), 0.13 m/s (moderate), 0.06 m/s (significant)  (ANPTA Core Set of Outcome Measures for Adults with Neurologic Conditions, 2018)  PT instructed pt in TUG: 15.16 sec  sec (average of 3 trials; >13.5 sec indicates increased fall risk)  Pt performed 5 time sit<>stand (5xSTS):  11.10 sec (>15 sec indicates increased fall risk)   Throughout session PT provided CGA to min assist for safety and to reduce fall risk.   PATIENT EDUCATION: Education details: Educated in Insurance Account Manager program, timing of therapy session, goals and impairments Person educated: Patient and Caregiver   Education method: Explanation, Demonstration, and Handouts Education comprehension: verbalized understanding and returned demonstration  HOME EXERCISE PROGRAM: Access Code: [email or web address] URL: [email or web address] Date: 04/17/2024 Prepared by: Massie Dollar  Exercises - Standing March with Counter Support  - 1 x daily - 7 x weekly - 3 sets - 10 reps - 3 second hold - Heel Toe Raises with Counter Support  - 1 x daily - 7 x weekly - 3 sets - 10 reps - 3 second hold - Side Stepping with Counter Support  - 1 x daily - 7 x weekly - 3 sets - 6 reps - Backward Walking with Counter Support  - 1 x daily - 7 x weekly - 3 sets - 6 reps - Mini Squat with Counter Support  - 1 x daily - 7 x weekly - 3 sets -  10 reps - Standing Hip Extension with Counter Support  - 1 x daily - 7 x weekly - 3 sets - 10 reps  GOALS: Goals reviewed with patient? No   SHORT TERM GOALS: Target date: 04/08/2024  Patient will be independent in home exercise  program to improve strength/mobility for better functional independence with ADLs. Baseline: Goal status: INITIAL   LONG TERM GOALS: Target date: 06/03/2024   Patient will increase ABC scale score >90% to demonstrate better functional mobility and better confidence with ADLs.  Baseline: 80.9375% Goal status: INITIAL  2.  Patient (> 80 years old) will complete five times sit to stand test in < 11 seconds indicating an increased LE strength and improved balance. Baseline: 12.56s 1/12: 11.10 Goal status: INITIAL  3.  Patient will increase Berg Balance score by > 6 points to demonstrate decreased fall risk during functional activities Baseline: 41/56 Goal status: INITIAL  4.  Patient will increase 10 meter walk test to >1.26m/s as to improve gait speed for better community ambulation and to reduce fall risk. Baseline: 0.78 m/s 1/12: 0.39m/s  Goal status: INITIAL  5.  Patient will reduce timed up and go to <13 seconds to reduce fall risk and demonstrate improved transfer/gait ability. Baseline: 16.105s 1/12: 15.16 sec  Goal status: INITIAL  6. Patient will improve by 14ft in order to improve ambulation distance and increase endurance for community ambulation. Baseline: next session* Goal status: INITIAL  ASSESSMENT:  CLINICAL IMPRESSION: Pt continues to arrive motivated and ready for activity.  Continued to increase gait distance with varied direction to improve step length and hip activation. Goals assessment initiated for progress note. Pt has noted to have increased gait speed, reduced time on 5x STS as well as reduced time on Tug with use of SPC. Will complete additional outcome measures for goal assessment to subsequent visit. Patient's condition has the potential to improve in response to therapy. Maximum improvement is yet to be obtained. The anticipated improvement is attainable and reasonable in a generally predictable time. Pt will continue to benefit from skilled  therapy to address remaining deficits in order to improve overall QoL and return to PLOF.    OBJECTIVE IMPAIRMENTS: Abnormal gait, decreased activity tolerance, decreased balance, decreased cognition, decreased endurance, decreased knowledge of use of DME, decreased mobility, difficulty walking, impaired UE functional use, improper body mechanics, and postural dysfunction.   ACTIVITY LIMITATIONS: carrying, lifting, stairs, reach over head, and hygiene/grooming  PARTICIPATION LIMITATIONS: medication management, community activity, and yard work  PERSONAL FACTORS: Age, Past/current experiences, Time since onset of injury/illness/exacerbation, and 3+ comorbidities: hx of cancer, mild dementia associated w/ alcoholism, chronic atrial fibrillation are also affecting patient's functional outcome.   REHAB POTENTIAL: Good  CLINICAL DECISION MAKING: Evolving/moderate complexity  EVALUATION COMPLEXITY: Moderate  PLAN:  PT FREQUENCY: 2x/week  PT DURATION: 12 weeks  PLANNED INTERVENTIONS: 97164- PT Re-evaluation, 97750- Physical Performance Testing, 97110-Therapeutic exercises, 97530- Therapeutic activity, [id number]- Neuromuscular re-education, 97535- Self Care, [id number]- Manual therapy, [phone]- Gait training, [phone]- Orthotic Initial, [phone]- Orthotic/Prosthetic subsequent, [phone]- Aquatic Therapy, [phone]- Electrical stimulation (unattended), [phone] (1-2 muscles), [phone] (3+ muscles)- Dry Needling, Patient/Family education, Balance training, Stair training, Taping, Joint mobilization, Joint manipulation, Spinal mobilization, Vestibular training, DME instructions, Wheelchair mobility training, Cryotherapy, and Moist heat  PLAN FOR NEXT SESSION:  Complete goal assessment.   Continue gait and resistance training.   Massie Dollar PT, DPT  Physical Therapist - [facility]  2:43 PM 04/29/2024      "

## 2024-05-01 ENCOUNTER — Ambulatory Visit: Admitting: Physical Therapy

## 2024-05-01 DIAGNOSIS — R2681 Unsteadiness on feet: Secondary | ICD-10-CM

## 2024-05-01 DIAGNOSIS — M6281 Muscle weakness (generalized): Secondary | ICD-10-CM

## 2024-05-01 DIAGNOSIS — R269 Unspecified abnormalities of gait and mobility: Secondary | ICD-10-CM

## 2024-05-01 DIAGNOSIS — R296 Repeated falls: Secondary | ICD-10-CM

## 2024-05-01 DIAGNOSIS — R293 Abnormal posture: Secondary | ICD-10-CM

## 2024-05-01 DIAGNOSIS — Z9181 History of falling: Secondary | ICD-10-CM

## 2024-05-01 NOTE — Therapy (Signed)
 " OUTPATIENT PHYSICAL THERAPY TREATMENT SESSION/  Patient Name: Kenneth Luna. MRN: [id number] DOB:[birth year], 89 y.o., male Today's Date: 05/01/2024   PCP: Kenneth Amis, MD  REFERRING PROVIDER: Alla Amis, MD   END OF SESSION:  PT End of Session - 05/01/24 1318     Visit Number 11    Number of Visits 24    Date for Recertification  06/03/24    Progress Note Due on Visit 10    PT Start Time 1318    PT Stop Time 1357    PT Time Calculation (min) 39 min    Equipment Utilized During Treatment Gait belt    Activity Tolerance Patient tolerated treatment well    Behavior During Therapy [facility] for tasks assessed/performed           Past Medical History:  Diagnosis Date   Allergic rhinitis    Anemia    B12 deficiency 06/29/2015   Benign neoplasm of ascending colon    Benign neoplasm of descending colon    Colon polyp    Colostomy in place [facility])    DD (diverticular disease) 06/29/2015   Dementia (HCC)    memory issues   Fatty liver    GERD (gastroesophageal reflux disease)    Glaucoma    Hyperlipidemia    Hypertension    Rectal adenocarcinoma (HCC) 07/15/2015   Substance abuse (HCC)    alcohol   Traumatic intraparenchymal hemorrhage (HCC)    Past Surgical History:  Procedure Laterality Date   ABDOMINOPERINEAL PROCTOCOLECTOMY  12/17/2015   [facility]   CATARACT EXTRACTION W/ INTRAOCULAR LENS IMPLANT     COLONOSCOPY WITH PROPOFOL  N/A 07/10/2015   Procedure: COLONOSCOPY WITH PROPOFOL ;  Surgeon: Kenneth Copping, MD;  Location: [facility];  Service: Endoscopy;  Laterality: N/A;  PER CAREGIVER WAS TOLD THAT PT WOULD BE 1ST (KEEP PT EARLY AM)   COLONOSCOPY WITH PROPOFOL  N/A 04/24/2017   Procedure: COLONOSCOPY WITH PROPOFOL ;  Surgeon: Luna Rogelia, MD;  Location: [facility];  Service: Endoscopy;  Laterality: N/A;   INSERT / REPLACE / REMOVE PACEMAKER  01/08/2020   PACEMAKER INSERTION Left 01/07/2020   Procedure: INSERTION PACEMAKER;  Surgeon:  Kenneth Blunt, MD;  Location: [facility] ORS;  Service: Cardiovascular;  Laterality: Left;   PACEMAKER LEADLESS INSERTION N/A 01/07/2020   Procedure: PACEMAKER LEADLESS INSERTION;  Surgeon: Kenneth Blunt, MD;  Location: [facility] INVASIVE CV LAB;  Service: Cardiovascular;  Laterality: N/A;   PERIPHERAL VASCULAR BALLOON ANGIOPLASTY N/A 01/07/2020   Procedure: PERIPHERAL VASCULAR BALLOON ANGIOPLASTY;  Surgeon: Kenneth Selinda RAMAN, MD;  Location: [facility] INVASIVE CV LAB;  Service: Cardiovascular;  Laterality: N/A;   PERIPHERAL VASCULAR BALLOON ANGIOPLASTY N/A 01/07/2020   Procedure: PERIPHERAL VASCULAR BALLOON ANGIOPLASTY;  Surgeon: Kenneth Cordella MATSU, MD;  Location: [facility] INVASIVE CV LAB;  Service: Cardiovascular;  Laterality: N/A;   PERIPHERAL VASCULAR THROMBECTOMY Right 01/20/2020   Procedure: PERIPHERAL VASCULAR THROMBECTOMY;  Surgeon: Kenneth Selinda RAMAN, MD;  Location: [facility] INVASIVE CV LAB;  Service: Cardiovascular;  Laterality: Right;   POLYPECTOMY  07/10/2015   Procedure: POLYPECTOMY;  Surgeon: Kenneth Copping, MD;  Location: [facility];  Service: Endoscopy;;   Patient Active Problem List   Diagnosis Date Noted   Chronic right shoulder pain 04/29/2020   Colostomy in place [facility]) 04/29/2020   Acute deep vein thrombosis (DVT) of femoral vein (HCC) 01/17/2020   DVT (deep venous thrombosis) (HCC) 01/14/2020   Sick sinus syndrome (HCC) 01/07/2020   Cardiac syncope 12/25/2019   Persistent atrial fibrillation (HCC) 11/15/2019  GERD without esophagitis 04/25/2018   Parastomal hernia without obstruction or gangrene 06/19/2017   Personal history of colon cancer    Benign neoplasm of ascending colon    Allergic rhinitis 01/30/2017   Encounter for general adult medical examination without abnormal findings 10/03/2016   Vaccine counseling 10/03/2016   Palmar plantar erythrodysesthesia 04/17/2016   Iron deficiency anemia 03/07/2016   Complete tear of right rotator cuff 11/16/2015   Rotator cuff tendinitis,  right 11/16/2015   Hyponatremia 10/09/2015   Rash of face 10/09/2015   Intracranial hemorrhage (HCC) 08/18/2015   Low back pain 08/18/2015   T12 compression fracture (HCC) 08/10/2015   Acute diverticulitis 08/08/2015   Generalized weakness 08/08/2015   Alcohol dependence (HCC) 08/08/2015   Diverticulitis 08/08/2015   Intraparenchymal hemorrhage of brain (HCC) 07/24/2015   Rectal adenocarcinoma (HCC) 07/15/2015   B12 deficiency 06/29/2015   Dementia (HCC) 06/29/2015   DD (diverticular disease) 06/29/2015   Essential (primary) hypertension 06/29/2015   GI bleed 06/23/2015    ONSET DATE: over the past couple years   REFERRING DIAG:  Z91.81 (ICD-10-CM) - At high risk for falls    THERAPY DIAG:  Abnormal posture  Abnormality of gait  History of falling  Muscle weakness (generalized)  Repeated falls  Unsteadiness on feet  Rationale for Evaluation and Treatment: Rehabilitation  SUBJECTIVE:                                                                                                                                                                                             SUBJECTIVE STATEMENT:   Pt reports that he is doing well. No reports of pain today, but states that he feels a little tired.   Pt accompanied by: caregiver, answers most questions   PERTINENT HISTORY:  From recent routine medica visit on 03/05/2024: Kenneth Luna. is a 89 y.o. here for preventative health exam and subsequent medicare wellness  Preventative health exam: Still has moments of weakness and instability and would like to follow-up with physical therapy. States that physical therapy never contacted them. Chronic medical issues stable and tolerating medications without adverse effects. Still receiving 24-hour care and supervision. Still cutting down alcohol to 1 bottle of wine per week and doing well with this. Exercises regularly and tries to adhere to healthy diet. No exertional cp  or syncopal episodes. No urinary issues or rectal pain/bleeding. Denies any tobacco use.  ROS Review of systems is unremarkable for any active cardiac, respiratory, GI, GU, hematologic, neurologic, dermatologic, HEENT, or psychiatric symptoms except as noted above. No fevers, chills, or constitutional symptoms.  PAIN:  Are you having  pain? No  PRECAUTIONS: None  RED FLAGS: None   WEIGHT BEARING RESTRICTIONS: No  FALLS: Has patient fallen in last 6 months? Yes. Number of falls 2 and tripped over the carpet to floor transition  LIVING ENVIRONMENT: Lives with: lives in an assisted living facility, has 24/7 care available Lives in: House/apartment Stairs: No Has following equipment at home: Single point cane and Walker - 2 wheeled  PLOF: Independent  PATIENT GOALS: Getting stronger and preventing falls.  OBJECTIVE:  Note: Objective measures were completed at Evaluation unless otherwise noted.  COGNITION: Overall cognitive status: History of cognitive impairments - at baseline and mild dementia, caregiver answers most questions.   SENSATION: WFL Light touch: WFL B LE  COORDINATION: RAMPS: WFL B H<>S: WFL B  POSTURE: rounded shoulders, forward head, increased thoracic kyphosis, and flexed trunk   LOWER EXTREMITY MMT:    MMT Right Eval Left Eval  Hip flexion 4+ 4+  Hip extension    Hip abduction 5 5  Hip adduction 5 5  Hip internal rotation    Hip external rotation    Knee flexion 4+ 4+  Knee extension 5 5  Ankle dorsiflexion 4+ 4+  Ankle plantarflexion    Ankle inversion    Ankle eversion    (Blank rows = not tested)   TRANSFERS: Sit to stand: Complete Independence  Assistive device utilized: Single point cane     Stand to sit: Complete Independence  Assistive device utilized: Single point cane      STAIRS: Not tested GAIT: Findings: Gait Characteristics: decreased step length- Right, decreased step length- Left, decreased stance time- Right, decreased  stance time- Left, decreased stride length, decreased trunk rotation, trunk flexed, and wide BOS, Distance walked: distance into and out of gym, distance of functional outcome measures performed this session, Assistive device utilized:Single point cane, Level of assistance: CGA, and Comments: ambulates with SPC improperly  FUNCTIONAL TESTS:  -Pt performed 5 time sit<>stand (5xSTS): 12.56 sec (>15 sec indicates increased fall risk)   -PT instructed pt in TUG: 16.105 sec (average of 3 trials; >13.5 sec indicates increased fall risk)  (trial 1: 15.96s, trial 2: 16.25s)  -6 minute walk test: 6 Min Walk Test:  Instructed patient to ambulate as quickly and as safely as possible for 6 minutes using LRAD. Patient was allowed to take standing rest breaks without stopping the test, but if the patient required a sitting rest break the clock would be stopped and the test would be over.  Results: 825 feet (251.46 meters, Avg speed 0.64m/s) using a SPC with CGA. Results indicate that the patient has reduced endurance with ambulation compared to age matched norms.  Age Matched Norms: 38-69 yo M: 68 F: 66, 73-79 yo M: 74 F: 471, 20-89 yo M: 417 F: 392 MDC: 58.21 meters (190.98 feet) or 50 meters (ANPTA Core Set of Outcome Measures for Adults with Neurologic Conditions, 2018)   -10 Meter Walk Test: Patient instructed to walk 10 meters (32.8 ft) as quickly and as safely as possible at their normal speed x2 and at a fast speed x2. Time measured from 2 meter mark to 8 meter mark to accommodate ramp-up and ramp-down.  Normal speed 1: 0.79 m/s, 12.63s (limited community ambulator) Normal speed 2: 0.77 m/s, 12.98s (limited community ambulator)  Cut off scores: <0.4 m/s = household Ambulator, 0.4-0.8 m/s = limited community Ambulator, >0.8 m/s = community Ambulator, >1.2 m/s = crossing a street, <1.0 = increased fall risk MCID 0.05 m/s (small), 0.13  m/s (moderate), 0.06 m/s (significant)  (ANPTA Core Set of Outcome  Measures for Adults with Neurologic Conditions, 2018)   -Berg Balance Scale: Patient demonstrates increased fall risk as noted by score of 41/56 on Berg Balance Scale.  (<36= high risk for falls, close to 100%; 37-45 significant >80%; 46-51 moderate >50%; 52-55 lower >25%)  Item Test date: 03/11/24  Date: 05/01/24 Date:   Sitting to standing 4. able to stand without using hands and stabilize independently 4. able to stand without using hands and stabilize independently  4. able to stand safely for 2 minutes  4. able to sit safely and securely for 2 minutes   4. sits safely with minimal use of hands  4. able to transfer safely with minor use of hands  3. able to stand 10 seconds with supervision  4. able to place feet together independently and stand 1 minute safely  3. can reach forward 12 cm (5 inches)    4. able to pick up slipper safely and easily  4. looks behind from both sides and weight shifts well    2. able to turn 360 degrees safely but slowly  3. able to stand independently and complete 8 steps in > 20 seconds     3. able to place foot ahead independently and hold 30 seconds   1. tries to lift leg unable to hold 3 seconds but remains standing independently.     Insert SmartPhrase OPRCBERGREEVAL  2. Standing unsupported 3. able to stand 2 minutes with supervision    3. Sitting with back unsupported, feet supported 4. able to sit safely and securely for 2 minutes    4. Standing to sitting 4. sits safely with minimal use of hands    5. Pivot transfer  4. able to transfer safely with minor use of hands    6. Standing unsupported with eyes closed 3. able to stand 10 seconds with supervision    7. Standing unsupported with feet together 3. able to place feet together independently and stand 1 minute with supervision    8. Reaching forward with outstretched arms while standing 3. can reach forward 12 cm (5 inches)    9. Pick up object from the floor from standing 4. able to  pick up slipper safely and easily    10. Turning to look behind over left and right shoulders while standing 2. turns sideways but only maintains balance    11. Turn 360 degrees 2. able to turn 360 degrees safely but slowly    12. Place alternate foot on step or stool while standing unsupported 1. able to complete > 2 steps needs minimal assist    13. Standing unsupported one foot in front 3. able to place foot ahead independently and hold 30 seconds    14. Standing on one leg 1. tries to lift leg unable to hold 3 seconds but remains standing independently.      Total Score 41/56 Total Score:   45/56 Total Score:      PATIENT SURVEYS:  ABC scale: The Activities-Specific Balance Confidence (ABC) Scale 0% 10 20 30  40 50 60 70 80 90 100% No confidence<->completely confident  How confident are you that you will not lose your balance or become unsteady when you . . .   Date tested 03/11/24    Walk around the house 100% 90  2. Walk up or down stairs 100% 90  3. Bend over and pick up a slipper from in front of  a closet floor 100% 100  4. Reach for a small can off a shelf at eye level 100% 100  5. Stand on tip toes and reach for something above your head 50% 85  6. Stand on a chair and reach for something 20% 5  7. Sweep the floor 100% 100  8. Walk outside the house to a car parked in the driveway 100% 100  9. Get into or out of a car 100% 100  10. Walk across a parking lot to the mall 100% 100  11. Walk up or down a ramp 90% 70  12. Walk in a crowded mall where people rapidly walk past you 100% 80  13. Are bumped into by people as you walk through the mall 100% 85  14. Step onto or off of an escalator while you are holding onto the railing 65% 80  15. Step onto or off an escalator while holding onto parcels such that you cannot hold onto the railing 50% 50  16. Walk outside on icy sidewalks 20% 60  Total: #/16  80.94% 75 %                                                                                                                                  TREATMENT DATE: 05/01/2024 ABC re-assessed see above  Patient demonstrates increased fall risk as noted by score of   45/56 on Berg Balance Scale.  (<36= high risk for falls, close to 100%; 37-45 significant >80%; 46-51 moderate >50%; 52-55 lower >25%)  6 Min Walk Test:  Instructed patient to ambulate as quickly and as safely as possible for 6 minutes using LRAD. Patient was allowed to take standing rest breaks without stopping the test, but if the patient required a sitting rest break the clock would be stopped and the test would be over.  Results: 939 feet  using a SPC with supervision assist. Results indicate that the patient has reduced endurance with ambulation compared to age matched norms.  Age Matched Norms: 20-69 yo M: 85 F: 26, 29-79 yo M: 10 F: 471, 68-89 yo M: 417 F: 392 MDC: 58.21 meters (190.98 feet) or 50 meters (ANPTA Core Set of Outcome Measures for Adults with Neurologic Conditions, 2018)  Step up 6 inch step with BUE support x 14 bil to simulate ascent of stair well.   Standing on airex pad 2 x 30 sec static stance Standing on airex pad, reciprocal foot tap on 6 inch step x 10 bil    Throughout session PT provided CGA to min assist for safety and to reduce fall risk. Multiple therapeutic rest breaks provided due to fatigue and for hydration remained attentive to patient needs throughout session.   PATIENT EDUCATION: Education details: Educated in Insurance Account Manager program, timing of therapy session, goals and impairments Person educated: Patient and Caregiver   Education method: Explanation, Demonstration, and Handouts Education comprehension: verbalized understanding and returned  demonstration  HOME EXERCISE PROGRAM: Access Code: [email or web address] URL: [email or web address] Date: 04/17/2024 Prepared by: Massie Dollar  Exercises - Standing March with Counter Support  - 1 x daily - 7 x weekly -  3 sets - 10 reps - 3 second hold - Heel Toe Raises with Counter Support  - 1 x daily - 7 x weekly - 3 sets - 10 reps - 3 second hold - Side Stepping with Counter Support  - 1 x daily - 7 x weekly - 3 sets - 6 reps - Backward Walking with Counter Support  - 1 x daily - 7 x weekly - 3 sets - 6 reps - Mini Squat with Counter Support  - 1 x daily - 7 x weekly - 3 sets - 10 reps - Standing Hip Extension with Counter Support  - 1 x daily - 7 x weekly - 3 sets - 10 reps  GOALS: Goals reviewed with patient? No   SHORT TERM GOALS: Target date: 04/08/2024  Patient will be independent in home exercise program to improve strength/mobility for better functional independence with ADLs. Baseline: Goal status: INITIAL   LONG TERM GOALS: Target date: 06/03/2024   Patient will increase ABC scale score >90% to demonstrate better functional mobility and better confidence with ADLs.  Baseline: 80.9375% 1/14: 75% Goal status: INITIAL  2.  Patient (> 20 years old) will complete five times sit to stand test in < 11 seconds indicating an increased LE strength and improved balance. Baseline: 12.56s 1/12: 11.10 Goal status: INITIAL  3.  Patient will increase Berg Balance score by > 6 points to demonstrate decreased fall risk during functional activities Baseline: 41/56 1/41/26: 45/56  Goal status: INITIAL  4.  Patient will increase 10 meter walk test to >1.45m/s as to improve gait speed for better community ambulation and to reduce fall risk. Baseline: 0.78 m/s 1/12: 0.74m/s  Goal status: INITIAL  5.  Patient will reduce timed up and go to <13 seconds to reduce fall risk and demonstrate improved transfer/gait ability. Baseline: 16.105s 1/12: 15.16 sec  Goal status: INITIAL  6. Patient will improve by 163ft in order to improve ambulation distance and increase endurance for community ambulation. Baseline: 825 feet with PSC  1/14: 933ft with SPC  Goal status: INITIAL  ASSESSMENT:  CLINICAL  IMPRESSION: Pt continues to arrive motivated and ready for activity. Completed functional assessment for progress towards LTG; pt demonstratgin continued functional status and independence with increased 6 min walk test by >154ft and berg improved by 4points. Improved balance and power noted in BLE with ability to ascent stimulated flight of stairs at rail on wall with only mild SOB,  Pt will continue to benefit from skilled therapy to address remaining deficits in order to improve overall QoL and return to PLOF.    OBJECTIVE IMPAIRMENTS: Abnormal gait, decreased activity tolerance, decreased balance, decreased cognition, decreased endurance, decreased knowledge of use of DME, decreased mobility, difficulty walking, impaired UE functional use, improper body mechanics, and postural dysfunction.   ACTIVITY LIMITATIONS: carrying, lifting, stairs, reach over head, and hygiene/grooming  PARTICIPATION LIMITATIONS: medication management, community activity, and yard work  PERSONAL FACTORS: Age, Past/current experiences, Time since onset of injury/illness/exacerbation, and 3+ comorbidities: hx of cancer, mild dementia associated w/ alcoholism, chronic atrial fibrillation are also affecting patient's functional outcome.   REHAB POTENTIAL: Good  CLINICAL DECISION MAKING: Evolving/moderate complexity  EVALUATION COMPLEXITY: Moderate  PLAN:  PT FREQUENCY: 2x/week  PT DURATION: 12 weeks  PLANNED INTERVENTIONS: 02835- PT Re-evaluation,  02249- Physical Performance Testing, 97110-Therapeutic exercises, 97530- Therapeutic activity, [id number]- Neuromuscular re-education, [phone]- Self Care, 02859- Manual therapy, [phone]- Gait training, [phone]- Orthotic Initial, [phone]- Orthotic/Prosthetic subsequent, [phone]- Aquatic Therapy, [phone]- Electrical stimulation (unattended), [phone] (1-2 muscles), [phone] (3+ muscles)- Dry Needling, Patient/Family education, Balance training, Stair training, Taping, Joint mobilization, Joint  manipulation, Spinal mobilization, Vestibular training, DME instructions, Wheelchair mobility training, Cryotherapy, and Moist heat  PLAN FOR NEXT SESSION:   Continue gait and resistance training. Balance and safety with reduced UE support   Massie Dollar PT, DPT  Physical Therapist - [place]  [facility]  1:19 PM 05/01/2024      "

## 2024-05-06 ENCOUNTER — Ambulatory Visit: Admitting: Physical Therapy

## 2024-05-06 DIAGNOSIS — R269 Unspecified abnormalities of gait and mobility: Secondary | ICD-10-CM

## 2024-05-06 DIAGNOSIS — R296 Repeated falls: Secondary | ICD-10-CM

## 2024-05-06 DIAGNOSIS — M6281 Muscle weakness (generalized): Secondary | ICD-10-CM

## 2024-05-06 DIAGNOSIS — R2681 Unsteadiness on feet: Secondary | ICD-10-CM

## 2024-05-06 DIAGNOSIS — Z9181 History of falling: Secondary | ICD-10-CM

## 2024-05-06 DIAGNOSIS — R293 Abnormal posture: Secondary | ICD-10-CM

## 2024-05-06 NOTE — Therapy (Signed)
 " OUTPATIENT PHYSICAL THERAPY TREATMENT SESSION/  Patient Name: Kenneth Luna. MRN: [id number] DOB:1932/11/20, 89 y.o., male Today's Date: 05/06/2024   PCP: Kenneth Amis, MD  REFERRING PROVIDER: Alla Amis, MD   END OF SESSION:  PT End of Session - 05/06/24 1155     Visit Number 12    Number of Visits 24    Date for Recertification  06/03/24    Progress Note Due on Visit 10    PT Start Time 1152    PT Stop Time 1230    PT Time Calculation (min) 38 min    Equipment Utilized During Treatment Gait belt    Activity Tolerance Patient tolerated treatment well    Behavior During Therapy [facility] for tasks assessed/performed           Past Medical History:  Diagnosis Date   Allergic rhinitis    Anemia    B12 deficiency 06/29/2015   Benign neoplasm of ascending colon    Benign neoplasm of descending colon    Colon polyp    Colostomy in place [facility])    DD (diverticular disease) 06/29/2015   Dementia (HCC)    memory issues   Fatty liver    GERD (gastroesophageal reflux disease)    Glaucoma    Hyperlipidemia    Hypertension    Rectal adenocarcinoma (HCC) 07/15/2015   Substance abuse (HCC)    alcohol   Traumatic intraparenchymal hemorrhage (HCC)    Past Surgical History:  Procedure Laterality Date   ABDOMINOPERINEAL PROCTOCOLECTOMY  12/17/2015   [facility]   CATARACT EXTRACTION W/ INTRAOCULAR LENS IMPLANT     COLONOSCOPY WITH PROPOFOL  N/A 07/10/2015   Procedure: COLONOSCOPY WITH PROPOFOL ;  Surgeon: Rogelia Copping, MD;  Location: [facility];  Service: Endoscopy;  Laterality: N/A;  PER CAREGIVER WAS TOLD THAT PT WOULD BE 1ST (KEEP PT EARLY AM)   COLONOSCOPY WITH PROPOFOL  N/A 04/24/2017   Procedure: COLONOSCOPY WITH PROPOFOL ;  Surgeon: Copping Rogelia, MD;  Location: [facility];  Service: Endoscopy;  Laterality: N/A;   INSERT / REPLACE / REMOVE PACEMAKER  01/08/2020   PACEMAKER INSERTION Left 01/07/2020   Procedure: INSERTION PACEMAKER;  Surgeon:  Ammon Blunt, MD;  Location: [facility] ORS;  Service: Cardiovascular;  Laterality: Left;   PACEMAKER LEADLESS INSERTION N/A 01/07/2020   Procedure: PACEMAKER LEADLESS INSERTION;  Surgeon: Ammon Blunt, MD;  Location: [facility] INVASIVE CV LAB;  Service: Cardiovascular;  Laterality: N/A;   PERIPHERAL VASCULAR BALLOON ANGIOPLASTY N/A 01/07/2020   Procedure: PERIPHERAL VASCULAR BALLOON ANGIOPLASTY;  Surgeon: Marea Selinda RAMAN, MD;  Location: [facility] INVASIVE CV LAB;  Service: Cardiovascular;  Laterality: N/A;   PERIPHERAL VASCULAR BALLOON ANGIOPLASTY N/A 01/07/2020   Procedure: PERIPHERAL VASCULAR BALLOON ANGIOPLASTY;  Surgeon: Jama Cordella MATSU, MD;  Location: [facility] INVASIVE CV LAB;  Service: Cardiovascular;  Laterality: N/A;   PERIPHERAL VASCULAR THROMBECTOMY Right 01/20/2020   Procedure: PERIPHERAL VASCULAR THROMBECTOMY;  Surgeon: Marea Selinda RAMAN, MD;  Location: [facility] INVASIVE CV LAB;  Service: Cardiovascular;  Laterality: Right;   POLYPECTOMY  07/10/2015   Procedure: POLYPECTOMY;  Surgeon: Rogelia Copping, MD;  Location: [facility];  Service: Endoscopy;;   Patient Active Problem List   Diagnosis Date Noted   Chronic right shoulder pain 04/29/2020   Colostomy in place [facility]) 04/29/2020   Acute deep vein thrombosis (DVT) of femoral vein (HCC) 01/17/2020   DVT (deep venous thrombosis) (HCC) 01/14/2020   Sick sinus syndrome (HCC) 01/07/2020   Cardiac syncope 12/25/2019   Persistent atrial fibrillation (HCC) 11/15/2019  GERD without esophagitis 04/25/2018   Parastomal hernia without obstruction or gangrene 06/19/2017   Personal history of colon cancer    Benign neoplasm of ascending colon    Allergic rhinitis 01/30/2017   Encounter for general adult medical examination without abnormal findings 10/03/2016   Vaccine counseling 10/03/2016   Palmar plantar erythrodysesthesia 04/17/2016   Iron deficiency anemia 03/07/2016   Complete tear of right rotator cuff 11/16/2015   Rotator cuff tendinitis,  right 11/16/2015   Hyponatremia 10/09/2015   Rash of face 10/09/2015   Intracranial hemorrhage (HCC) 08/18/2015   Low back pain 08/18/2015   T12 compression fracture (HCC) 08/10/2015   Acute diverticulitis 08/08/2015   Generalized weakness 08/08/2015   Alcohol dependence (HCC) 08/08/2015   Diverticulitis 08/08/2015   Intraparenchymal hemorrhage of brain (HCC) 07/24/2015   Rectal adenocarcinoma (HCC) 07/15/2015   B12 deficiency 06/29/2015   Dementia (HCC) 06/29/2015   DD (diverticular disease) 06/29/2015   Essential (primary) hypertension 06/29/2015   GI bleed 06/23/2015    ONSET DATE: over the past couple years   REFERRING DIAG:  Z91.81 (ICD-10-CM) - At high risk for falls    THERAPY DIAG:  Abnormality of gait  History of falling  Repeated falls  Unsteadiness on feet  Abnormal posture  Muscle weakness (generalized)  Rationale for Evaluation and Treatment: Rehabilitation  SUBJECTIVE:                                                                                                                                                                                             SUBJECTIVE STATEMENT:   Pt reports that he is doing well. No reports of pain today, but states that he feels a little tired.   Pt accompanied by: caregiver, answers most questions   PERTINENT HISTORY:  From recent routine medica visit on 03/05/2024: Kenneth Ohms. is a 89 y.o. here for preventative health exam and subsequent medicare wellness  Preventative health exam: Still has moments of weakness and instability and would like to follow-up with physical therapy. States that physical therapy never contacted them. Chronic medical issues stable and tolerating medications without adverse effects. Still receiving 24-hour care and supervision. Still cutting down alcohol to 1 bottle of wine per week and doing well with this. Exercises regularly and tries to adhere to healthy diet. No exertional cp  or syncopal episodes. No urinary issues or rectal pain/bleeding. Denies any tobacco use.  ROS Review of systems is unremarkable for any active cardiac, respiratory, GI, GU, hematologic, neurologic, dermatologic, HEENT, or psychiatric symptoms except as noted above. No fevers, chills, or constitutional symptoms.  PAIN:  Are you having  pain? No  PRECAUTIONS: None  RED FLAGS: None   WEIGHT BEARING RESTRICTIONS: No  FALLS: Has patient fallen in last 6 months? Yes. Number of falls 2 and tripped over the carpet to floor transition  LIVING ENVIRONMENT: Lives with: lives in an assisted living facility, has 24/7 care available Lives in: House/apartment Stairs: No Has following equipment at home: Single point cane and Walker - 2 wheeled  PLOF: Independent  PATIENT GOALS: Getting stronger and preventing falls.  OBJECTIVE:  Note: Objective measures were completed at Evaluation unless otherwise noted.  COGNITION: Overall cognitive status: History of cognitive impairments - at baseline and mild dementia, caregiver answers most questions.   SENSATION: WFL Light touch: WFL B LE  COORDINATION: RAMPS: WFL B H<>S: WFL B  POSTURE: rounded shoulders, forward head, increased thoracic kyphosis, and flexed trunk   LOWER EXTREMITY MMT:    MMT Right Eval Left Eval  Hip flexion 4+ 4+  Hip extension    Hip abduction 5 5  Hip adduction 5 5  Hip internal rotation    Hip external rotation    Knee flexion 4+ 4+  Knee extension 5 5  Ankle dorsiflexion 4+ 4+  Ankle plantarflexion    Ankle inversion    Ankle eversion    (Blank rows = not tested)   TRANSFERS: Sit to stand: Complete Independence  Assistive device utilized: Single point cane     Stand to sit: Complete Independence  Assistive device utilized: Single point cane      STAIRS: Not tested GAIT: Findings: Gait Characteristics: decreased step length- Right, decreased step length- Left, decreased stance time- Right, decreased  stance time- Left, decreased stride length, decreased trunk rotation, trunk flexed, and wide BOS, Distance walked: distance into and out of gym, distance of functional outcome measures performed this session, Assistive device utilized:Single point cane, Level of assistance: CGA, and Comments: ambulates with SPC improperly  FUNCTIONAL TESTS:  -Pt performed 5 time sit<>stand (5xSTS): 12.56 sec (>15 sec indicates increased fall risk)   -PT instructed pt in TUG: 16.105 sec (average of 3 trials; >13.5 sec indicates increased fall risk)  (trial 1: 15.96s, trial 2: 16.25s)  -6 minute walk test: 6 Min Walk Test:  Instructed patient to ambulate as quickly and as safely as possible for 6 minutes using LRAD. Patient was allowed to take standing rest breaks without stopping the test, but if the patient required a sitting rest break the clock would be stopped and the test would be over.  Results: 825 feet (251.46 meters, Avg speed 0.42m/s) using a SPC with CGA. Results indicate that the patient has reduced endurance with ambulation compared to age matched norms.  Age Matched Norms: 46-69 yo M: 91 F: 72, 62-79 yo M: 22 F: 471, 64-89 yo M: 417 F: 392 MDC: 58.21 meters (190.98 feet) or 50 meters (ANPTA Core Set of Outcome Measures for Adults with Neurologic Conditions, 2018)   -10 Meter Walk Test: Patient instructed to walk 10 meters (32.8 ft) as quickly and as safely as possible at their normal speed x2 and at a fast speed x2. Time measured from 2 meter mark to 8 meter mark to accommodate ramp-up and ramp-down.  Normal speed 1: 0.79 m/s, 12.63s (limited community ambulator) Normal speed 2: 0.77 m/s, 12.98s (limited community ambulator)  Cut off scores: <0.4 m/s = household Ambulator, 0.4-0.8 m/s = limited community Ambulator, >0.8 m/s = community Ambulator, >1.2 m/s = crossing a street, <1.0 = increased fall risk MCID 0.05 m/s (small), 0.13  m/s (moderate), 0.06 m/s (significant)  (ANPTA Core Set of Outcome  Measures for Adults with Neurologic Conditions, 2018)   -Berg Balance Scale: Patient demonstrates increased fall risk as noted by score of 41/56 on Berg Balance Scale.  (<36= high risk for falls, close to 100%; 37-45 significant >80%; 46-51 moderate >50%; 52-55 lower >25%)  Item Test date: 03/11/24  Date: 05/01/24 Date:   Sitting to standing 4. able to stand without using hands and stabilize independently 4. able to stand without using hands and stabilize independently  4. able to stand safely for 2 minutes  4. able to sit safely and securely for 2 minutes   4. sits safely with minimal use of hands  4. able to transfer safely with minor use of hands  3. able to stand 10 seconds with supervision  4. able to place feet together independently and stand 1 minute safely  3. can reach forward 12 cm (5 inches)    4. able to pick up slipper safely and easily  4. looks behind from both sides and weight shifts well    2. able to turn 360 degrees safely but slowly  3. able to stand independently and complete 8 steps in > 20 seconds     3. able to place foot ahead independently and hold 30 seconds   1. tries to lift leg unable to hold 3 seconds but remains standing independently.     Insert SmartPhrase OPRCBERGREEVAL  2. Standing unsupported 3. able to stand 2 minutes with supervision    3. Sitting with back unsupported, feet supported 4. able to sit safely and securely for 2 minutes    4. Standing to sitting 4. sits safely with minimal use of hands    5. Pivot transfer  4. able to transfer safely with minor use of hands    6. Standing unsupported with eyes closed 3. able to stand 10 seconds with supervision    7. Standing unsupported with feet together 3. able to place feet together independently and stand 1 minute with supervision    8. Reaching forward with outstretched arms while standing 3. can reach forward 12 cm (5 inches)    9. Pick up object from the floor from standing 4. able to  pick up slipper safely and easily    10. Turning to look behind over left and right shoulders while standing 2. turns sideways but only maintains balance    11. Turn 360 degrees 2. able to turn 360 degrees safely but slowly    12. Place alternate foot on step or stool while standing unsupported 1. able to complete > 2 steps needs minimal assist    13. Standing unsupported one foot in front 3. able to place foot ahead independently and hold 30 seconds    14. Standing on one leg 1. tries to lift leg unable to hold 3 seconds but remains standing independently.      Total Score 41/56 Total Score:   45/56 Total Score:      PATIENT SURVEYS:  ABC scale: The Activities-Specific Balance Confidence (ABC) Scale 0% 10 20 30  40 50 60 70 80 90 100% No confidence<->completely confident  How confident are you that you will not lose your balance or become unsteady when you . . .   Date tested 03/11/24    Walk around the house 100% 90  2. Walk up or down stairs 100% 90  3. Bend over and pick up a slipper from in front of  a closet floor 100% 100  4. Reach for a small can off a shelf at eye level 100% 100  5. Stand on tip toes and reach for something above your head 50% 85  6. Stand on a chair and reach for something 20% 5  7. Sweep the floor 100% 100  8. Walk outside the house to a car parked in the driveway 100% 100  9. Get into or out of a car 100% 100  10. Walk across a parking lot to the mall 100% 100  11. Walk up or down a ramp 90% 70  12. Walk in a crowded mall where people rapidly walk past you 100% 80  13. Are bumped into by people as you walk through the mall 100% 85  14. Step onto or off of an escalator while you are holding onto the railing 65% 80  15. Step onto or off an escalator while holding onto parcels such that you cannot hold onto the railing 50% 50  16. Walk outside on icy sidewalks 20% 60  Total: #/16  80.94% 75 %                                                                                                                                  TREATMENT DATE: 05/06/2024 Weighted gait with 4# AW 2 x 155ft with SPC  Reciprocal foot tap on 6inch step x 12 bil   Stair ascent/descent x 12 steps with BUE support on rails.   Weighed gait with 4# AW 2x150 with SPC   Reciprocal foot tap on 6inch step x 12 bil   Stair ascent/descent x 12 steps with BUE support on rails.   Gait through rehab gym with no AD and no resistance to weave through large gym equipment x 287ft. No LOB, but cues for direction  Forward partial lunge stepping over SPC to raise 1 KG ball overhead x 10 bil   Lateral partial lunge with same side lateral reach x 10 bil   Throughout session PT provided CGA to min assist for safety and to reduce fall risk. Multiple therapeutic rest breaks provided due to fatigue and for hydration remained attentive to patient needs throughout session.   PATIENT EDUCATION: Education details: Educated in Insurance Account Manager program, timing of therapy session, goals and impairments Person educated: Patient and Caregiver   Education method: Explanation, Demonstration, and Handouts Education comprehension: verbalized understanding and returned demonstration  HOME EXERCISE PROGRAM: Access Code: [email or web address] URL: [email or web address] Date: 04/17/2024 Prepared by: Massie Dollar  Exercises - Standing March with Counter Support  - 1 x daily - 7 x weekly - 3 sets - 10 reps - 3 second hold - Heel Toe Raises with Counter Support  - 1 x daily - 7 x weekly - 3 sets - 10 reps - 3 second hold - Side Stepping with Counter Support  - 1 x daily - 7 x weekly - 3 sets -  6 reps - Backward Walking with Counter Support  - 1 x daily - 7 x weekly - 3 sets - 6 reps - Mini Squat with Counter Support  - 1 x daily - 7 x weekly - 3 sets - 10 reps - Standing Hip Extension with Counter Support  - 1 x daily - 7 x weekly - 3 sets - 10 reps  GOALS: Goals reviewed with patient? No   SHORT  TERM GOALS: Target date: 04/08/2024  Patient will be independent in home exercise program to improve strength/mobility for better functional independence with ADLs. Baseline: Goal status: INITIAL   LONG TERM GOALS: Target date: 06/03/2024   Patient will increase ABC scale score >90% to demonstrate better functional mobility and better confidence with ADLs.  Baseline: 80.9375% 1/14: 75% Goal status: INITIAL  2.  Patient (> 19 years old) will complete five times sit to stand test in < 11 seconds indicating an increased LE strength and improved balance. Baseline: 12.56s 1/12: 11.10 Goal status: INITIAL  3.  Patient will increase Berg Balance score by > 6 points to demonstrate decreased fall risk during functional activities Baseline: 41/56 1/41/26: 45/56  Goal status: INITIAL  4.  Patient will increase 10 meter walk test to >1.73m/s as to improve gait speed for better community ambulation and to reduce fall risk. Baseline: 0.78 m/s 1/12: 0.4m/s  Goal status: INITIAL  5.  Patient will reduce timed up and go to <13 seconds to reduce fall risk and demonstrate improved transfer/gait ability. Baseline: 16.105s 1/12: 15.16 sec  Goal status: INITIAL  6. Patient will improve by 154ft in order to improve ambulation distance and increase endurance for community ambulation. Baseline: 825 feet with PSC  1/14: 98ft with SPC  Goal status: INITIAL  ASSESSMENT:  CLINICAL IMPRESSION: Pt continues to arrive motivated and ready for activity.  PT treatment focused on improved activity tolerance in weighted position. Tolerated multiple bouts of weighted gait for ~134ft as well as stair management in weighted position. Decreased SOB noted on final bout compared to first bout. No LOB noted with Dynamic stepping tasks on this day. Patient's condition has the potential to improve in response to therapy. Maximum improvement is yet to be obtained. The anticipated improvement is attainable and  reasonable in a generally predictable time. Pt will continue to benefit from skilled therapy to address remaining deficits in order to improve overall QoL and return to PLOF.    OBJECTIVE IMPAIRMENTS: Abnormal gait, decreased activity tolerance, decreased balance, decreased cognition, decreased endurance, decreased knowledge of use of DME, decreased mobility, difficulty walking, impaired UE functional use, improper body mechanics, and postural dysfunction.   ACTIVITY LIMITATIONS: carrying, lifting, stairs, reach over head, and hygiene/grooming  PARTICIPATION LIMITATIONS: medication management, community activity, and yard work  PERSONAL FACTORS: Age, Past/current experiences, Time since onset of injury/illness/exacerbation, and 3+ comorbidities: hx of cancer, mild dementia associated w/ alcoholism, chronic atrial fibrillation are also affecting patient's functional outcome.   REHAB POTENTIAL: Good  CLINICAL DECISION MAKING: Evolving/moderate complexity  EVALUATION COMPLEXITY: Moderate  PLAN:  PT FREQUENCY: 2x/week  PT DURATION: 12 weeks  PLANNED INTERVENTIONS: 97164- PT Re-evaluation, 97750- Physical Performance Testing, 97110-Therapeutic exercises, 97530- Therapeutic activity, 97112- Neuromuscular re-education, 97535- Self Care, [id number]- Manual therapy, [phone]- Gait training, [phone]- Orthotic Initial, [phone]- Orthotic/Prosthetic subsequent, [phone]- Aquatic Therapy, [phone]- Electrical stimulation (unattended), [phone] (1-2 muscles), [phone] (3+ muscles)- Dry Needling, Patient/Family education, Balance training, Stair training, Taping, Joint mobilization, Joint manipulation, Spinal mobilization, Vestibular training, DME instructions, Wheelchair mobility training,  Cryotherapy, and Moist heat  PLAN FOR NEXT SESSION:   Continue gait and resistance training. Balance and safety with reduced UE support   Massie Dollar PT, DPT  Physical Therapist - [place]  [facility]  1:12  PM 05/06/24      "

## 2024-05-14 ENCOUNTER — Ambulatory Visit: Admitting: Physical Therapy

## 2024-05-14 DIAGNOSIS — R269 Unspecified abnormalities of gait and mobility: Secondary | ICD-10-CM

## 2024-05-14 DIAGNOSIS — Z9181 History of falling: Secondary | ICD-10-CM

## 2024-05-14 DIAGNOSIS — M6281 Muscle weakness (generalized): Secondary | ICD-10-CM

## 2024-05-14 DIAGNOSIS — R293 Abnormal posture: Secondary | ICD-10-CM

## 2024-05-14 DIAGNOSIS — R2681 Unsteadiness on feet: Secondary | ICD-10-CM

## 2024-05-14 DIAGNOSIS — R296 Repeated falls: Secondary | ICD-10-CM

## 2024-05-14 NOTE — Therapy (Signed)
 " OUTPATIENT PHYSICAL THERAPY TREATMENT SESSION/  Patient Name: Kenneth Luna. MRN: [id number] DOB:March 12, 1933, 89 y.o., male Today's Date: 05/14/2024   PCP: Kenneth Amis, MD  REFERRING PROVIDER: Alla Amis, MD   END OF SESSION:  PT End of Session - 05/14/24 1321     Visit Number 13    Number of Visits 24    Date for Recertification  06/03/24    Progress Note Due on Visit 10    PT Start Time 1321    PT Stop Time 1400    PT Time Calculation (min) 39 min    Equipment Utilized During Treatment Gait belt    Activity Tolerance Patient tolerated treatment well    Behavior During Therapy [facility] for tasks assessed/performed           Past Medical History:  Diagnosis Date   Allergic rhinitis    Anemia    B12 deficiency 06/29/2015   Benign neoplasm of ascending colon    Benign neoplasm of descending colon    Colon polyp    Colostomy in place [facility])    DD (diverticular disease) 06/29/2015   Dementia (HCC)    memory issues   Fatty liver    GERD (gastroesophageal reflux disease)    Glaucoma    Hyperlipidemia    Hypertension    Rectal adenocarcinoma (HCC) 07/15/2015   Substance abuse (HCC)    alcohol   Traumatic intraparenchymal hemorrhage (HCC)    Past Surgical History:  Procedure Laterality Date   ABDOMINOPERINEAL PROCTOCOLECTOMY  12/17/2015   [facility]   CATARACT EXTRACTION W/ INTRAOCULAR LENS IMPLANT     COLONOSCOPY WITH PROPOFOL  N/A 07/10/2015   Procedure: COLONOSCOPY WITH PROPOFOL ;  Surgeon: Kenneth Copping, MD;  Location: [facility];  Service: Endoscopy;  Laterality: N/A;  PER CAREGIVER WAS TOLD THAT PT WOULD BE 1ST (KEEP PT EARLY AM)   COLONOSCOPY WITH PROPOFOL  N/A 04/24/2017   Procedure: COLONOSCOPY WITH PROPOFOL ;  Surgeon: Luna Rogelia, MD;  Location: [facility];  Service: Endoscopy;  Laterality: N/A;   INSERT / REPLACE / REMOVE PACEMAKER  01/08/2020   PACEMAKER INSERTION Left 01/07/2020   Procedure: INSERTION PACEMAKER;  Surgeon:  Kenneth Blunt, MD;  Location: [facility] ORS;  Service: Cardiovascular;  Laterality: Left;   PACEMAKER LEADLESS INSERTION N/A 01/07/2020   Procedure: PACEMAKER LEADLESS INSERTION;  Surgeon: Kenneth Blunt, MD;  Location: [facility] INVAS[facility] CV [facility];  Service: Cardiovascular;  Laterality: N/A;   PERIPHERAL VASCULAR BALLOON ANGIOPLASTY N/A 01/07/2020   Procedure: PERIPHERAL VASCULAR BALLOON ANGIOPLASTY;  Surgeon: Kenneth Selinda RAMAN, MD;  Location: [facility] IN[facility] CV [facility];  Service: Cardiovascular;  Laterality: N/A;   PERIPHERAL VASCULAR BALLOON ANGIOPLASTY N/A 01/07/2020   Procedure: PERIPHERAL VASCULAR BALLOON ANGIOPLASTY;  Surgeon: Kenneth Cordella MATSU, MD;  Location: [facility] IN[facility] CV [facility];  Service: Cardiovascular;  Laterality: N/A;   PERIPHERAL VASCULAR THROMBECTOMY Right 01/20/2020   Procedure: PERIPHERAL VASCULAR THROMBECTOMY;  Surgeon: Kenneth Selinda RAMAN, MD;  Location: [facility] IN[facility] CV [facility];  Service: Cardiovascular;  Laterality: Right;   POLYPECTOMY  07/10/2015   Procedure: POLYPECTOMY;  Surgeon: Kenneth Copping, MD;  Location: [facility];  Service: Endoscopy;;   Patient Active Problem List   Diagnosis Date Noted   Chronic right shoulder pain 04/29/2020   Colostomy in place [facility]) 04/29/2020   Acute deep vein thrombosis (DVT) of femoral vein (HCC) 01/17/2020   DVT (deep venous thrombosis) (HCC) 01/14/2020   Sick sinus syndrome (HCC) 01/07/2020   Cardiac syncope 12/25/2019   Persistent atrial fibrillation (HCC) 11/15/2019  GERD without esophagitis 04/25/2018   Parastomal hernia without obstruction or gangrene 06/19/2017   Personal history of colon cancer    Benign neoplasm of ascending colon    Allergic rhinitis 01/30/2017   Encounter for general adult medical examination without abnormal findings 10/03/2016   Vaccine counseling 10/03/2016   Palmar plantar erythrodysesthesia 04/17/2016   Iron deficiency anemia 03/07/2016   Complete tear of right rotator cuff 11/16/2015   Rotator cuff tendinitis,  right 11/16/2015   Hyponatremia 10/09/2015   Rash of face 10/09/2015   Intracranial hemorrhage (HCC) 08/18/2015   Low back pain 08/18/2015   T12 compression fracture (HCC) 08/10/2015   Acute diverticulitis 08/08/2015   Generalized weakness 08/08/2015   Alcohol dependence (HCC) 08/08/2015   Diverticulitis 08/08/2015   Intraparenchymal hemorrhage of brain (HCC) 07/24/2015   Rectal adenocarcinoma (HCC) 07/15/2015   B12 deficiency 06/29/2015   Dementia (HCC) 06/29/2015   DD (diverticular disease) 06/29/2015   Essential (primary) hypertension 06/29/2015   GI bleed 06/23/2015    ONSET DATE: over the past couple years   REFERRING DIAG:  Z91.81 (ICD-10-CM) - At high risk for falls    THERAPY DIAG:  Abnormality of gait  History of falling  Repeated falls  Unsteadiness on feet  Abnormal posture  Muscle weakness (generalized)  Rationale for Evaluation and Treatment: Rehabilitation  SUBJECTIVE:                                                                                                                                                                                             SUBJECTIVE STATEMENT:   Pt reports that he is doing well. No issues driving into PT clinic on this day  or with snow storm over the weekend.   No pain and no other updates.   Pt accompanied by: caregiver, answers most questions   PERTINENT HISTORY:  From recent routine medica visit on 03/05/2024: Kenneth Luna. is a 89 y.o. here for preventative health exam and subsequent medicare wellness  Preventative health exam: Still has moments of weakness and instability and would like to follow-up with physical therapy. States that physical therapy never contacted them. Chronic medical issues stable and tolerating medications without adverse effects. Still receiving 24-hour care and supervision. Still cutting down alcohol to 1 bottle of wine per week and doing well with this. Exercises regularly and  tries to adhere to healthy diet. No exertional cp or syncopal episodes. No urinary issues or rectal pain/bleeding. Denies any tobacco use.  ROS Review of systems is unremarkable for any active cardiac, respiratory, GI, GU, hematologic, neurologic, dermatologic, HEENT, or psychiatric symptoms except as noted above.  No fevers, chills, or constitutional symptoms.  PAIN:  Are you having pain? No  PRECAUTIONS: None  RED FLAGS: None   WEIGHT BEARING RESTRICTIONS: No  FALLS: Has patient fallen in last 6 months? Yes. Number of falls 2 and tripped over the carpet to floor transition  LIVING ENVIRONMENT: Lives with: lives in an assisted living facility, has 24/7 care available Lives in: House/apartment Stairs: No Has following equipment at home: Single point cane and Walker - 2 wheeled  PLOF: Independent  PATIENT GOALS: Getting stronger and preventing falls.  OBJECTIVE:  Note: Objective measures were completed at Evaluation unless otherwise noted.  COGNITION: Overall cognitive status: History of cognitive impairments - at baseline and mild dementia, caregiver answers most questions.   SENSATION: WFL Light touch: WFL B LE  COORDINATION: RAMPS: WFL B H<>S: WFL B  POSTURE: rounded shoulders, forward head, increased thoracic kyphosis, and flexed trunk   LOWER EXTREMITY MMT:    MMT Right Eval Left Eval  Hip flexion 4+ 4+  Hip extension    Hip abduction 5 5  Hip adduction 5 5  Hip internal rotation    Hip external rotation    Knee flexion 4+ 4+  Knee extension 5 5  Ankle dorsiflexion 4+ 4+  Ankle plantarflexion    Ankle inversion    Ankle eversion    (Blank rows = not tested)   TRANSFERS: Sit to stand: Complete Independence  Assistive device utilized: Single point cane     Stand to sit: Complete Independence  Assistive device utilized: Single point cane      STAIRS: Not tested GAIT: Findings: Gait Characteristics: decreased step length- Right, decreased step  length- Left, decreased stance time- Right, decreased stance time- Left, decreased stride length, decreased trunk rotation, trunk flexed, and wide BOS, Distance walked: distance into and out of gym, distance of functional outcome measures performed this session, Assistive device utilized:Single point cane, Level of assistance: CGA, and Comments: ambulates with SPC improperly  FUNCTIONAL TESTS:  -Pt performed 5 time sit<>stand (5xSTS): 12.56 sec (>15 sec indicates increased fall risk)   -PT instructed pt in TUG: 16.105 sec (average of 3 trials; >13.5 sec indicates increased fall risk)  (trial 1: 15.96s, trial 2: 16.25s)  -6 minute walk test: 6 Min Walk Test:  Instructed patient to ambulate as quickly and as safely as possible for 6 minutes using LRAD. Patient was allowed to take standing rest breaks without stopping the test, but if the patient required a sitting rest break the clock would be stopped and the test would be over.  Results: 825 feet (251.46 meters, Avg speed 0.59m/s) using a SPC with CGA. Results indicate that the patient has reduced endurance with ambulation compared to age matched norms.  Age Matched Norms: 16-69 yo M: 47 F: 51, 37-79 yo M: 54 F: 471, 42-89 yo M: 417 F: 392 MDC: 58.21 meters (190.98 feet) or 50 meters (ANPTA Core Set of Outcome Measures for Adults with Neurologic Conditions, 2018)   -10 Meter Walk Test: Patient instructed to walk 10 meters (32.8 ft) as quickly and as safely as possible at their normal speed x2 and at a fast speed x2. Time measured from 2 meter mark to 8 meter mark to accommodate ramp-up and ramp-down.  Normal speed 1: 0.79 m/s, 12.63s (limited community ambulator) Normal speed 2: 0.77 m/s, 12.98s (limited community ambulator)  Cut off scores: <0.4 m/s = household Ambulator, 0.4-0.8 m/s = limited community Ambulator, >0.8 m/s = community Ambulator, >1.2 m/s = crossing  a street, <1.0 = increased fall risk MCID 0.05 m/s (small), 0.13 m/s  (moderate), 0.06 m/s (significant)  (ANPTA Core Set of Outcome Measures for Adults with Neurologic Conditions, 2018)   -Berg Balance Scale: Patient demonstrates increased fall risk as noted by score of 41/56 on Berg Balance Scale.  (<36= high risk for falls, close to 100%; 37-45 significant >80%; 46-51 moderate >50%; 52-55 lower >25%)  Item Test date: 03/11/24  Date: 05/01/24 Date:   Sitting to standing 4. able to stand without using hands and stabilize independently 4. able to stand without using hands and stabilize independently  4. able to stand safely for 2 minutes  4. able to sit safely and securely for 2 minutes   4. sits safely with minimal use of hands  4. able to transfer safely with minor use of hands  3. able to stand 10 seconds with supervision  4. able to place feet together independently and stand 1 minute safely  3. can reach forward 12 cm (5 inches)    4. able to pick up slipper safely and easily  4. looks behind from both sides and weight shifts well    2. able to turn 360 degrees safely but slowly  3. able to stand independently and complete 8 steps in > 20 seconds     3. able to place foot ahead independently and hold 30 seconds   1. tries to lift leg unable to hold 3 seconds but remains standing independently.     Insert SmartPhrase OPRCBERGREEVAL  2. Standing unsupported 3. able to stand 2 minutes with supervision    3. Sitting with back unsupported, feet supported 4. able to sit safely and securely for 2 minutes    4. Standing to sitting 4. sits safely with minimal use of hands    5. Pivot transfer  4. able to transfer safely with minor use of hands    6. Standing unsupported with eyes closed 3. able to stand 10 seconds with supervision    7. Standing unsupported with feet together 3. able to place feet together independently and stand 1 minute with supervision    8. Reaching forward with outstretched arms while standing 3. can reach forward 12 cm (5 inches)     9. Pick up object from the floor from standing 4. able to pick up slipper safely and easily    10. Turning to look behind over left and right shoulders while standing 2. turns sideways but only maintains balance    11. Turn 360 degrees 2. able to turn 360 degrees safely but slowly    12. Place alternate foot on step or stool while standing unsupported 1. able to complete > 2 steps needs minimal assist    13. Standing unsupported one foot in front 3. able to place foot ahead independently and hold 30 seconds    14. Standing on one leg 1. tries to lift leg unable to hold 3 seconds but remains standing independently.      Total Score 41/56 Total Score:   45/56 Total Score:      PATIENT SURVEYS:  ABC scale: The Activities-Specific Balance Confidence (ABC) Scale 0% 10 20 30  40 50 60 70 80 90 100% No confidence<->completely confident  How confident are you that you will not lose your balance or become unsteady when you . . .   Date tested 03/11/24    Walk around the house 100% 90  2. Walk up or down stairs 100% 90  3. Bend over and pick up a slipper from in front of a closet floor 100% 100  4. Reach for a small can off a shelf at eye level 100% 100  5. Stand on tip toes and reach for something above your head 50% 85  6. Stand on a chair and reach for something 20% 5  7. Sweep the floor 100% 100  8. Walk outside the house to a car parked in the driveway 100% 100  9. Get into or out of a car 100% 100  10. Walk across a parking lot to the mall 100% 100  11. Walk up or down a ramp 90% 70  12. Walk in a crowded mall where people rapidly walk past you 100% 80  13. Are bumped into by people as you walk through the mall 100% 85  14. Step onto or off of an escalator while you are holding onto the railing 65% 80  15. Step onto or off an escalator while holding onto parcels such that you cannot hold onto the railing 50% 50  16. Walk outside on icy sidewalks 20% 60  Total: #/16  80.94% 75 %                                                                                                                                  TREATMENT DATE: 05/14/2024  Gait without AD x 489ft. Only mild SOB noted upon completion.  Seated rest break  Stair ascent/descent with 1 UE support 4 steps 3. Cues for decreased speed on descent.   Seated rest break  Forward Step up/down 4inch aerobic step x 10. Then performed on 6inch step x 8  Seated rest break   Side stepping up/down 6inch aerobic step x 8 bil   Seated rest break  Single limb suitcase carry 7#BD x 134ft. Performed 1 lap in each UE. With seated rest break after each bouts.   Farmers carry with 2, 5# DB x 184ft.   Seated rest break for hydration.   Forward/reverse gait over 3 SPC x 5 laps. Cues for set up and step length In each direction, but more in reverse than forward.    Seated rest break  Side stepping over 3 canes in floor x 5 bil. Min assist from PT and cues for safety for BLE positioning to reduce fall risk and improve step length  Throughout session PT provided CGA to min assist for safety and to reduce fall risk. Multiple therapeutic rest breaks provided due to fatigue and for hydration remained attentive to patient needs throughout session.   PATIENT EDUCATION: Education details: Educated in Insurance Account Manager program, timing of therapy session, goals and impairments Person educated: Patient and Caregiver   Education method: Explanation, Demonstration, and Handouts Education comprehension: verbalized understanding and returned demonstration  HOME EXERCISE PROGRAM: Access Code: [email or web address] URL: [email or web address] Date: 04/17/2024 Prepared by: Massie Dollar  Exercises - Standing March with Counter  Support  - 1 x daily - 7 x weekly - 3 sets - 10 reps - 3 second hold - Heel Toe Raises with Counter Support  - 1 x daily - 7 x weekly - 3 sets - 10 reps - 3 second hold - Side Stepping with Counter Support  - 1 x  daily - 7 x weekly - 3 sets - 6 reps - Backward Walking with Counter Support  - 1 x daily - 7 x weekly - 3 sets - 6 reps - Mini Squat with Counter Support  - 1 x daily - 7 x weekly - 3 sets - 10 reps - Standing Hip Extension with Counter Support  - 1 x daily - 7 x weekly - 3 sets - 10 reps  GOALS: Goals reviewed with patient? No   SHORT TERM GOALS: Target date: 04/08/2024  Patient will be independent in home exercise program to improve strength/mobility for better functional independence with ADLs. Baseline: Goal status: INITIAL   LONG TERM GOALS: Target date: 06/03/2024   Patient will increase ABC scale score >90% to demonstrate better functional mobility and better confidence with ADLs.  Baseline: 80.9375% 1/14: 75% Goal status: INITIAL  2.  Patient (> 36 years old) will complete five times sit to stand test in < 11 seconds indicating an increased LE strength and improved balance. Baseline: 12.56s 1/12: 11.10 Goal status: INITIAL  3.  Patient will increase Berg Balance score by > 6 points to demonstrate decreased fall risk during functional activities Baseline: 41/56 1/41/26: 45/56  Goal status: INITIAL  4.  Patient will increase 10 meter walk test to >1.39m/s as to improve gait speed for better community ambulation and to reduce fall risk. Baseline: 0.78 m/s 1/12: 0.52m/s  Goal status: INITIAL  5.  Patient will reduce timed up and go to <13 seconds to reduce fall risk and demonstrate improved transfer/gait ability. Baseline: 16.105s 1/12: 15.16 sec  Goal status: INITIAL  6. Patient will improve by 161ft in order to improve ambulation distance and increase endurance for community ambulation. Baseline: 825 feet with PSC  1/14: 926ft with SPC  Goal status: INITIAL  ASSESSMENT:  CLINICAL IMPRESSION: Pt continues to arrive motivated and ready for activity.  PT treatment focused on improved activity tolerance and functional movement patterns including step ups, side  stepping and weighted carry for prolonged distance. PT required to provide occasional min assist for step management and for stepping over obstacles forward/reverse and sideways. But improved posture and movement patterns in weighted position with increased repetitions.  Pt will continue to benefit from skilled therapy to address remaining deficits in order to improve overall QoL and return to PLOF.    OBJECTIVE IMPAIRMENTS: Abnormal gait, decreased activity tolerance, decreased balance, decreased cognition, decreased endurance, decreased knowledge of use of DME, decreased mobility, difficulty walking, impaired UE functional use, improper body mechanics, and postural dysfunction.   ACTIVITY LIMITATIONS: carrying, lifting, stairs, reach over head, and hygiene/grooming  PARTICIPATION LIMITATIONS: medication management, community activity, and yard work  PERSONAL FACTORS: Age, Past/current experiences, Time since onset of injury/illness/exacerbation, and 3+ comorbidities: hx of cancer, mild dementia associated w/ alcoholism, chronic atrial fibrillation are also affecting patient's functional outcome.   REHAB POTENTIAL: Good  CLINICAL DECISION MAKING: Evolving/moderate complexity  EVALUATION COMPLEXITY: Moderate  PLAN:  PT FREQUENCY: 2x/week  PT DURATION: 12 weeks  PLANNED INTERVENTIONS: 97164- PT Re-evaluation, 97750- Physical Performance Testing, 97110-Therapeutic exercises, 97530- Therapeutic activity, [id number]- Neuromuscular re-education, 97535- Self Care, [id number]- Manual therapy, [id number]- Gait  training, 02239- Orthotic Initial, [phone]- Orthotic/Prosthetic subsequent, [phone]- Aquatic Therapy, [phone]- Electrical stimulation (unattended), [phone] (1-2 muscles), [id number] (3+ muscles)- Dry Needling, Patient/Family education, Balance training, Stair training, Taping, Joint mobilization, Joint manipulation, Spinal mobilization, Vestibular training, DME instructions, Wheelchair mobility training, Cryotherapy, and  Moist heat  PLAN FOR NEXT SESSION:   Continue dynamic gait and resistance training.  Balance and safety with reduced UE support   Massie Dollar PT, DPT  Physical Therapist - [place]  [facility]  1:22 PM 05/14/24      "

## 2024-05-16 ENCOUNTER — Ambulatory Visit

## 2024-05-16 DIAGNOSIS — R2681 Unsteadiness on feet: Secondary | ICD-10-CM

## 2024-05-16 DIAGNOSIS — R296 Repeated falls: Secondary | ICD-10-CM

## 2024-05-16 DIAGNOSIS — R293 Abnormal posture: Secondary | ICD-10-CM

## 2024-05-16 DIAGNOSIS — Z9181 History of falling: Secondary | ICD-10-CM

## 2024-05-16 DIAGNOSIS — M6281 Muscle weakness (generalized): Secondary | ICD-10-CM

## 2024-05-16 DIAGNOSIS — R269 Unspecified abnormalities of gait and mobility: Secondary | ICD-10-CM

## 2024-05-16 NOTE — Therapy (Signed)
 " OUTPATIENT PHYSICAL THERAPY TREATMENT SESSION/  Patient Name: Kenneth Luna. MRN: [id number] DOB:Mar 01, 1933, 89 y.o., male Today's Date: 05/16/2024   PCP: Kenneth Amis, MD  REFERRING PROVIDER: Alla Amis, MD   END OF SESSION:  PT End of Session - 05/16/24 1453     Visit Number 14    Number of Visits 24    Date for Recertification  06/03/24    Progress Note Due on Visit 10    PT Start Time 1452    PT Stop Time 1530    PT Time Calculation (min) 38 min    Equipment Utilized During Treatment Gait belt    Activity Tolerance Patient tolerated treatment well    Behavior During Therapy [facility] for tasks assessed/performed           Past Medical History:  Diagnosis Date   Allergic rhinitis    Anemia    B12 deficiency 06/29/2015   Benign neoplasm of ascending colon    Benign neoplasm of descending colon    Colon polyp    Colostomy in place [facility])    DD (diverticular disease) 06/29/2015   Dementia (HCC)    memory issues   Fatty liver    GERD (gastroesophageal reflux disease)    Glaucoma    Hyperlipidemia    Hypertension    Rectal adenocarcinoma (HCC) 07/15/2015   Substance abuse (HCC)    alcohol   Traumatic intraparenchymal hemorrhage (HCC)    Past Surgical History:  Procedure Laterality Date   ABDOMINOPERINEAL PROCTOCOLECTOMY  12/17/2015   [facility]   CATARACT EXTRACTION W/ INTRAOCULAR LENS IMPLANT     COLONOSCOPY WITH PROPOFOL  N/A 07/10/2015   Procedure: COLONOSCOPY WITH PROPOFOL ;  Surgeon: Kenneth Copping, MD;  Location: [facility];  Service: Endoscopy;  Laterality: N/A;  PER CAREGIVER WAS TOLD THAT PT WOULD BE 1ST (KEEP PT EARLY AM)   COLONOSCOPY WITH PROPOFOL  N/A 04/24/2017   Procedure: COLONOSCOPY WITH PROPOFOL ;  Surgeon: Luna Rogelia, MD;  Location: [facility];  Service: Endoscopy;  Laterality: N/A;   INSERT / REPLACE / REMOVE PACEMAKER  01/08/2020   PACEMAKER INSERTION Left 01/07/2020   Procedure: INSERTION PACEMAKER;  Surgeon:  Kenneth Blunt, MD;  Location: [facility] ORS;  Service: Cardiovascular;  Laterality: Left;   PACEMAKER LEADLESS INSERTION N/A 01/07/2020   Procedure: PACEMAKER LEADLESS INSERTION;  Surgeon: Kenneth Blunt, MD;  Location: [facility] INVASIVE CV LAB;  Service: Cardiovascular;  Laterality: N/A;   PERIPHERAL VASCULAR BALLOON ANGIOPLASTY N/A 01/07/2020   Procedure: PERIPHERAL VASCULAR BALLOON ANGIOPLASTY;  Surgeon: Kenneth Selinda RAMAN, MD;  Location: [facility] INVASIVE CV LAB;  Service: Cardiovascular;  Laterality: N/A;   PERIPHERAL VASCULAR BALLOON ANGIOPLASTY N/A 01/07/2020   Procedure: PERIPHERAL VASCULAR BALLOON ANGIOPLASTY;  Surgeon: Kenneth Cordella MATSU, MD;  Location: [facility] INVASIVE CV LAB;  Service: Cardiovascular;  Laterality: N/A;   PERIPHERAL VASCULAR THROMBECTOMY Right 01/20/2020   Procedure: PERIPHERAL VASCULAR THROMBECTOMY;  Surgeon: Kenneth Selinda RAMAN, MD;  Location: [facility] INVASIVE CV LAB;  Service: Cardiovascular;  Laterality: Right;   POLYPECTOMY  07/10/2015   Procedure: POLYPECTOMY;  Surgeon: Kenneth Copping, MD;  Location: [facility];  Service: Endoscopy;;   Patient Active Problem List   Diagnosis Date Noted   Chronic right shoulder pain 04/29/2020   Colostomy in place [facility]) 04/29/2020   Acute deep vein thrombosis (DVT) of femoral vein (HCC) 01/17/2020   DVT (deep venous thrombosis) (HCC) 01/14/2020   Sick sinus syndrome (HCC) 01/07/2020   Cardiac syncope 12/25/2019   Persistent atrial fibrillation (HCC) 11/15/2019  GERD without esophagitis 04/25/2018   Parastomal hernia without obstruction or gangrene 06/19/2017   Personal history of colon cancer    Benign neoplasm of ascending colon    Allergic rhinitis 01/30/2017   Encounter for general adult medical examination without abnormal findings 10/03/2016   Vaccine counseling 10/03/2016   Palmar plantar erythrodysesthesia 04/17/2016   Iron deficiency anemia 03/07/2016   Complete tear of right rotator cuff 11/16/2015   Rotator cuff tendinitis,  right 11/16/2015   Hyponatremia 10/09/2015   Rash of face 10/09/2015   Intracranial hemorrhage (HCC) 08/18/2015   Low back pain 08/18/2015   T12 compression fracture (HCC) 08/10/2015   Acute diverticulitis 08/08/2015   Generalized weakness 08/08/2015   Alcohol dependence (HCC) 08/08/2015   Diverticulitis 08/08/2015   Intraparenchymal hemorrhage of brain (HCC) 07/24/2015   Rectal adenocarcinoma (HCC) 07/15/2015   B12 deficiency 06/29/2015   Dementia (HCC) 06/29/2015   DD (diverticular disease) 06/29/2015   Essential (primary) hypertension 06/29/2015   GI bleed 06/23/2015    ONSET DATE: over the past couple years   REFERRING DIAG:  Z91.81 (ICD-10-CM) - At high risk for falls    THERAPY DIAG:  Abnormality of gait  History of falling  Repeated falls  Unsteadiness on feet  Abnormal posture  Muscle weakness (generalized)  Rationale for Evaluation and Treatment: Rehabilitation  SUBJECTIVE:                                                                                                                                                                                             SUBJECTIVE STATEMENT:   Pt reports that he is doing well.  Pt was unable to have a nap today per caregiver report, so he may be a little off  Pt accompanied by: caregiver, answers most questions   PERTINENT HISTORY:  From recent routine medica visit on 03/05/2024: Kenneth Luna. is a 89 y.o. here for preventative health exam and subsequent medicare wellness  Preventative health exam: Still has moments of weakness and instability and would like to follow-up with physical therapy. States that physical therapy never contacted them. Chronic medical issues stable and tolerating medications without adverse effects. Still receiving 24-hour care and supervision. Still cutting down alcohol to 1 bottle of wine per week and doing well with this. Exercises regularly and tries to adhere to healthy  diet. No exertional cp or syncopal episodes. No urinary issues or rectal pain/bleeding. Denies any tobacco use.  ROS Review of systems is unremarkable for any active cardiac, respiratory, GI, GU, hematologic, neurologic, dermatologic, HEENT, or psychiatric symptoms except as noted above. No fevers, chills, or constitutional symptoms.  PAIN:  Are you having pain? No  PRECAUTIONS: None  RED FLAGS: None   WEIGHT BEARING RESTRICTIONS: No  FALLS: Has patient fallen in last 6 months? Yes. Number of falls 2 and tripped over the carpet to floor transition  LIVING ENVIRONMENT: Lives with: lives in an assisted living facility, has 24/7 care available Lives in: House/apartment Stairs: No Has following equipment at home: Single point cane and Walker - 2 wheeled  PLOF: Independent  PATIENT GOALS: Getting stronger and preventing falls.  OBJECTIVE:  Note: Objective measures were completed at Evaluation unless otherwise noted.  COGNITION: Overall cognitive status: History of cognitive impairments - at baseline and mild dementia, caregiver answers most questions.   SENSATION: WFL Light touch: WFL B LE  COORDINATION: RAMPS: WFL B H<>S: WFL B  POSTURE: rounded shoulders, forward head, increased thoracic kyphosis, and flexed trunk   LOWER EXTREMITY MMT:    MMT Right Eval Left Eval  Hip flexion 4+ 4+  Hip extension    Hip abduction 5 5  Hip adduction 5 5  Hip internal rotation    Hip external rotation    Knee flexion 4+ 4+  Knee extension 5 5  Ankle dorsiflexion 4+ 4+  Ankle plantarflexion    Ankle inversion    Ankle eversion    (Blank rows = not tested)   TRANSFERS: Sit to stand: Complete Independence  Assistive device utilized: Single point cane     Stand to sit: Complete Independence  Assistive device utilized: Single point cane      STAIRS: Not tested GAIT: Findings: Gait Characteristics: decreased step length- Right, decreased step length- Left, decreased stance  time- Right, decreased stance time- Left, decreased stride length, decreased trunk rotation, trunk flexed, and wide BOS, Distance walked: distance into and out of gym, distance of functional outcome measures performed this session, Assistive device utilized:Single point cane, Level of assistance: CGA, and Comments: ambulates with SPC improperly  FUNCTIONAL TESTS:  -Pt performed 5 time sit<>stand (5xSTS): 12.56 sec (>15 sec indicates increased fall risk)   -PT instructed pt in TUG: 16.105 sec (average of 3 trials; >13.5 sec indicates increased fall risk)  (trial 1: 15.96s, trial 2: 16.25s)  -6 minute walk test: 6 Min Walk Test:  Instructed patient to ambulate as quickly and as safely as possible for 6 minutes using LRAD. Patient was allowed to take standing rest breaks without stopping the test, but if the patient required a sitting rest break the clock would be stopped and the test would be over.  Results: 825 feet (251.46 meters, Avg speed 0.66m/s) using a SPC with CGA. Results indicate that the patient has reduced endurance with ambulation compared to age matched norms.  Age Matched Norms: 16-69 yo M: 69 F: 65, 13-79 yo M: 69 F: 471, 61-89 yo M: 417 F: 392 MDC: 58.21 meters (190.98 feet) or 50 meters (ANPTA Core Set of Outcome Measures for Adults with Neurologic Conditions, 2018)   -10 Meter Walk Test: Patient instructed to walk 10 meters (32.8 ft) as quickly and as safely as possible at their normal speed x2 and at a fast speed x2. Time measured from 2 meter mark to 8 meter mark to accommodate ramp-up and ramp-down.  Normal speed 1: 0.79 m/s, 12.63s (limited community ambulator) Normal speed 2: 0.77 m/s, 12.98s (limited community ambulator)  Cut off scores: <0.4 m/s = household Ambulator, 0.4-0.8 m/s = limited community Ambulator, >0.8 m/s = community Ambulator, >1.2 m/s = crossing a street, <1.0 = increased fall risk  MCID 0.05 m/s (small), 0.13 m/s (moderate), 0.06 m/s (significant)   (ANPTA Core Set of Outcome Measures for Adults with Neurologic Conditions, 2018)   -Berg Balance Scale: Patient demonstrates increased fall risk as noted by score of 41/56 on Berg Balance Scale.  (<36= high risk for falls, close to 100%; 37-45 significant >80%; 46-51 moderate >50%; 52-55 lower >25%)  Item Test date: 03/11/24  Date: 05/01/24 Date:   Sitting to standing 4. able to stand without using hands and stabilize independently 4. able to stand without using hands and stabilize independently  4. able to stand safely for 2 minutes  4. able to sit safely and securely for 2 minutes   4. sits safely with minimal use of hands  4. able to transfer safely with minor use of hands  3. able to stand 10 seconds with supervision  4. able to place feet together independently and stand 1 minute safely  3. can reach forward 12 cm (5 inches)    4. able to pick up slipper safely and easily  4. looks behind from both sides and weight shifts well    2. able to turn 360 degrees safely but slowly  3. able to stand independently and complete 8 steps in > 20 seconds     3. able to place foot ahead independently and hold 30 seconds   1. tries to lift leg unable to hold 3 seconds but remains standing independently.     Insert SmartPhrase OPRCBERGREEVAL  2. Standing unsupported 3. able to stand 2 minutes with supervision    3. Sitting with back unsupported, feet supported 4. able to sit safely and securely for 2 minutes    4. Standing to sitting 4. sits safely with minimal use of hands    5. Pivot transfer  4. able to transfer safely with minor use of hands    6. Standing unsupported with eyes closed 3. able to stand 10 seconds with supervision    7. Standing unsupported with feet together 3. able to place feet together independently and stand 1 minute with supervision    8. Reaching forward with outstretched arms while standing 3. can reach forward 12 cm (5 inches)    9. Pick up object from the floor  from standing 4. able to pick up slipper safely and easily    10. Turning to look behind over left and right shoulders while standing 2. turns sideways but only maintains balance    11. Turn 360 degrees 2. able to turn 360 degrees safely but slowly    12. Place alternate foot on step or stool while standing unsupported 1. able to complete > 2 steps needs minimal assist    13. Standing unsupported one foot in front 3. able to place foot ahead independently and hold 30 seconds    14. Standing on one leg 1. tries to lift leg unable to hold 3 seconds but remains standing independently.      Total Score 41/56 Total Score:   45/56 Total Score:      PATIENT SURVEYS:  ABC scale: The Activities-Specific Balance Confidence (ABC) Scale 0% 10 20 30  40 50 60 70 80 90 100% No confidence<->completely confident  How confident are you that you will not lose your balance or become unsteady when you . . .   Date tested 03/11/24    Walk around the house 100% 90  2. Walk up or down stairs 100% 90  3. Bend over and pick up a  slipper from in front of a closet floor 100% 100  4. Reach for a small can off a shelf at eye level 100% 100  5. Stand on tip toes and reach for something above your head 50% 85  6. Stand on a chair and reach for something 20% 5  7. Sweep the floor 100% 100  8. Walk outside the house to a car parked in the driveway 100% 100  9. Get into or out of a car 100% 100  10. Walk across a parking lot to the mall 100% 100  11. Walk up or down a ramp 90% 70  12. Walk in a crowded mall where people rapidly walk past you 100% 80  13. Are bumped into by people as you walk through the mall 100% 85  14. Step onto or off of an escalator while you are holding onto the railing 65% 80  15. Step onto or off an escalator while holding onto parcels such that you cannot hold onto the railing 50% 50  16. Walk outside on icy sidewalks 20% 60  Total: #/16  80.94% 75 %                                                                                                                                  TREATMENT DATE: 05/16/2024     Gait with AD x 535ft. No reported SOB  Standing on airex beam:  Static stance x 1 min  Side stepping x 4 bil  Tandem stance x 5 bil with BUE support x 20 sec. Then x 5 sec bil without UR support   Standing with 1 LE on airex beam for partial SLS 4 x 20 sec bil   Gait carrying crate x 173ft level surface Gait carrying crate through obstacles including 3 half bolster, 2 airex pads and up/down 4inch step. 1 laps CW, 2 laps CCW. Rest break between directions.  Min assist occasionally for stepping on/off airex pad and 4inch step due to posterior LOB with change in surface.   Sit<>stand with 2KG ball 2 x 10   Forward/reverse gait 33ft forward/5 ft reverse for 9ft cues for increased step width to reduce fall risk from tripping on shoes. No overt LOB noted.   Throughout session PT provided CGA to min assist for safety and to reduce fall risk. Multiple therapeutic rest breaks provided due to fatigue and for hydration remained attentive to patient needs throughout session.   PATIENT EDUCATION: Education details: Educated in Insurance Account Manager program, timing of therapy session, goals and impairments Person educated: Patient and Caregiver   Education method: Explanation, Demonstration, and Handouts Education comprehension: verbalized understanding and returned demonstration  HOME EXERCISE PROGRAM: Access Code: [email or web address] URL: [email or web address] Date: 04/17/2024 Prepared by: Massie Dollar  Exercises - Standing March with Counter Support  - 1 x daily - 7 x weekly - 3 sets - 10 reps - 3 second hold - Heel  Toe Raises with Counter Support  - 1 x daily - 7 x weekly - 3 sets - 10 reps - 3 second hold - Side Stepping with Counter Support  - 1 x daily - 7 x weekly - 3 sets - 6 reps - Backward Walking with Counter Support  - 1 x daily - 7 x weekly - 3 sets - 6  reps - Mini Squat with Counter Support  - 1 x daily - 7 x weekly - 3 sets - 10 reps - Standing Hip Extension with Counter Support  - 1 x daily - 7 x weekly - 3 sets - 10 reps  GOALS: Goals reviewed with patient? No   SHORT TERM GOALS: Target date: 04/08/2024  Patient will be independent in home exercise program to improve strength/mobility for better functional independence with ADLs. Baseline: Goal status: INITIAL   LONG TERM GOALS: Target date: 06/03/2024   Patient will increase ABC scale score >90% to demonstrate better functional mobility and better confidence with ADLs.  Baseline: 80.9375% 1/14: 75% Goal status: INITIAL  2.  Patient (> 89 years old) will complete five times sit to stand test in < 11 seconds indicating an increased LE strength and improved balance. Baseline: 12.56s 1/12: 11.10 Goal status: INITIAL  3.  Patient will increase Berg Balance score by > 6 points to demonstrate decreased fall risk during functional activities Baseline: 41/56 1/41/26: 45/56  Goal status: INITIAL  4.  Patient will increase 10 meter walk test to >1.68m/s as to improve gait speed for better community ambulation and to reduce fall risk. Baseline: 0.78 m/s 1/12: 0.65m/s  Goal status: INITIAL  5.  Patient will reduce timed up and go to <13 seconds to reduce fall risk and demonstrate improved transfer/gait ability. Baseline: 16.105s 1/12: 15.16 sec  Goal status: INITIAL  6. Patient will improve by 149ft in order to improve ambulation distance and increase endurance for community ambulation. Baseline: 825 feet with PSC  1/14: 941ft with SPC  Goal status: INITIAL  ASSESSMENT:  CLINICAL IMPRESSION: Pt continues to arrive motivated and ready for activity.  PT treatment focused on improved activity tolerance and functional movement patterns for standing and carrying items like a laundry basket over various obstacles and surfaces. Difficulty with change in surfaces from tile to  yoga mat to aire pad requiring min assist to prevent posterior LOB. But improved posture and movement patterns in weighted position with increased repetitions especially with stepping over large obstacles such as 1/2 bolsters.  Pt will continue to benefit from skilled therapy to address remaining deficits in order to improve overall QoL and return to PLOF.    OBJECTIVE IMPAIRMENTS: Abnormal gait, decreased activity tolerance, decreased balance, decreased cognition, decreased endurance, decreased knowledge of use of DME, decreased mobility, difficulty walking, impaired UE functional use, improper body mechanics, and postural dysfunction.   ACTIVITY LIMITATIONS: carrying, lifting, stairs, reach over head, and hygiene/grooming  PARTICIPATION LIMITATIONS: medication management, community activity, and yard work  PERSONAL FACTORS: Age, Past/current experiences, Time since onset of injury/illness/exacerbation, and 3+ comorbidities: hx of cancer, mild dementia associated w/ alcoholism, chronic atrial fibrillation are also affecting patient's functional outcome.   REHAB POTENTIAL: Good  CLINICAL DECISION MAKING: Evolving/moderate complexity  EVALUATION COMPLEXITY: Moderate  PLAN:  PT FREQUENCY: 2x/week  PT DURATION: 12 weeks  PLANNED INTERVENTIONS: 97164- PT Re-evaluation, 97750- Physical Performance Testing, 97110-Therapeutic exercises, 97530- Therapeutic activity, [id number]- Neuromuscular re-education, 97535- Self Care, [id number]- Manual therapy, [id number]- Gait training, [id number]- Orthotic Initial, [id number]- Orthotic/Prosthetic subsequent,  02886- Aquatic Therapy, [id number]- Electrical stimulation (unattended), [phone] (1-2 muscles), 20561 (3+ muscles)- Dry Needling, Patient/Family education, Balance training, Stair training, Taping, Joint mobilization, Joint manipulation, Spinal mobilization, Vestibular training, DME instructions, Wheelchair mobility training, Cryotherapy, and Moist heat  PLAN FOR NEXT SESSION:    Continue dynamic gait and resistance training.  Balance and safety with reduced UE support   Massie Dollar PT, DPT  Physical Therapist - [place]  [facility]  2:53 PM 05/16/24      "

## 2024-05-20 ENCOUNTER — Ambulatory Visit: Admitting: Physical Therapy

## 2024-05-27 ENCOUNTER — Ambulatory Visit: Admitting: Physical Therapy

## 2024-05-30 ENCOUNTER — Ambulatory Visit: Admitting: Physical Therapy

## 2024-06-04 ENCOUNTER — Ambulatory Visit: Admitting: Physical Therapy

## 2024-06-05 ENCOUNTER — Ambulatory Visit: Admitting: Physical Therapy

## 2024-06-06 ENCOUNTER — Ambulatory Visit: Admitting: Physical Therapy

## 2024-06-10 ENCOUNTER — Ambulatory Visit: Admitting: Physical Therapy

## 2024-06-12 ENCOUNTER — Ambulatory Visit: Admitting: Physical Therapy
